# Patient Record
Sex: Male | Born: 1935
Health system: Southern US, Community
[De-identification: ages and names within clinical notes are randomized; demographics above are authoritative.]

## PROBLEM LIST (undated history)

## (undated) DIAGNOSIS — M1990 Unspecified osteoarthritis, unspecified site: Secondary | ICD-10-CM

## (undated) DIAGNOSIS — C61 Malignant neoplasm of prostate: Secondary | ICD-10-CM

## (undated) DIAGNOSIS — E785 Hyperlipidemia, unspecified: Secondary | ICD-10-CM

## (undated) DIAGNOSIS — I10 Essential (primary) hypertension: Secondary | ICD-10-CM

## (undated) DIAGNOSIS — C189 Malignant neoplasm of colon, unspecified: Secondary | ICD-10-CM

## (undated) DIAGNOSIS — N529 Male erectile dysfunction, unspecified: Secondary | ICD-10-CM

## (undated) DIAGNOSIS — K810 Acute cholecystitis: Secondary | ICD-10-CM

## (undated) DIAGNOSIS — J439 Emphysema, unspecified: Secondary | ICD-10-CM

## (undated) DIAGNOSIS — Z973 Presence of spectacles and contact lenses: Secondary | ICD-10-CM

## (undated) HISTORY — PX: APPENDECTOMY: SHX54

## (undated) HISTORY — PX: COLONOSCOPY: SHX174

## (undated) HISTORY — DX: Malignant neoplasm of colon, unspecified: C18.9

---

## 1995-06-14 HISTORY — PX: COLON SURGERY: SHX602

## 1998-05-01 ENCOUNTER — Other Ambulatory Visit: Admission: RE | Admit: 1998-05-01 | Discharge: 1998-05-01 | Payer: Self-pay | Admitting: Urology

## 1999-05-10 ENCOUNTER — Ambulatory Visit (HOSPITAL_COMMUNITY): Admission: RE | Admit: 1999-05-10 | Discharge: 1999-05-10 | Payer: Self-pay | Admitting: Gastroenterology

## 2001-11-24 ENCOUNTER — Emergency Department (HOSPITAL_COMMUNITY): Admission: EM | Admit: 2001-11-24 | Discharge: 2001-11-24 | Payer: Self-pay

## 2002-03-18 ENCOUNTER — Encounter: Payer: Self-pay | Admitting: Oncology

## 2002-03-18 ENCOUNTER — Encounter: Admission: RE | Admit: 2002-03-18 | Discharge: 2002-03-18 | Payer: Self-pay | Admitting: Oncology

## 2002-10-11 ENCOUNTER — Encounter (INDEPENDENT_AMBULATORY_CARE_PROVIDER_SITE_OTHER): Payer: Self-pay | Admitting: Specialist

## 2002-10-11 ENCOUNTER — Ambulatory Visit (HOSPITAL_COMMUNITY): Admission: RE | Admit: 2002-10-11 | Discharge: 2002-10-11 | Payer: Self-pay | Admitting: Gastroenterology

## 2008-09-10 ENCOUNTER — Ambulatory Visit (HOSPITAL_COMMUNITY): Admission: RE | Admit: 2008-09-10 | Discharge: 2008-09-10 | Payer: Self-pay | Admitting: Urology

## 2008-10-03 ENCOUNTER — Ambulatory Visit: Admission: RE | Admit: 2008-10-03 | Discharge: 2008-12-24 | Payer: Self-pay | Admitting: Radiation Oncology

## 2008-12-24 ENCOUNTER — Ambulatory Visit: Admission: RE | Admit: 2008-12-24 | Discharge: 2009-02-18 | Payer: Self-pay | Admitting: Radiation Oncology

## 2010-08-26 ENCOUNTER — Other Ambulatory Visit: Payer: Self-pay | Admitting: Gastroenterology

## 2010-10-12 HISTORY — PX: CARPAL TUNNEL RELEASE: SHX101

## 2010-10-27 ENCOUNTER — Encounter (HOSPITAL_BASED_OUTPATIENT_CLINIC_OR_DEPARTMENT_OTHER)
Admission: RE | Admit: 2010-10-27 | Discharge: 2010-10-27 | Disposition: A | Discharge status: Home / Self Care | Payer: Medicare Other | Source: Ambulatory Visit | Attending: Orthopedic Surgery | Admitting: Orthopedic Surgery

## 2010-10-27 ENCOUNTER — Ambulatory Visit
Admission: RE | Admit: 2010-10-27 | Discharge: 2010-10-27 | Disposition: A | Discharge status: Home / Self Care | Payer: Medicare Other | Source: Ambulatory Visit | Attending: Orthopedic Surgery | Admitting: Orthopedic Surgery

## 2010-10-27 ENCOUNTER — Other Ambulatory Visit: Payer: Self-pay | Admitting: Orthopedic Surgery

## 2010-10-27 DIAGNOSIS — Z01811 Encounter for preprocedural respiratory examination: Secondary | ICD-10-CM

## 2010-10-27 LAB — BASIC METABOLIC PANEL
BUN: 19 mg/dL (ref 6–23)
CO2: 27 mEq/L (ref 19–32)
Calcium: 9.8 mg/dL (ref 8.4–10.5)
Chloride: 99 mEq/L (ref 96–112)
Creatinine, Ser: 0.97 mg/dL (ref 0.4–1.5)
GFR calc Af Amer: >60 mL/min (ref >60)
GFR calc non Af Amer: >60 mL/min (ref >60)
Glucose, Bld: 138 mg/dL — ABNORMAL HIGH (ref 70–99)
Potassium: 4.5 mEq/L (ref 3.5–5.1)
Sodium: 136 mEq/L (ref 135–145)

## 2010-10-29 ENCOUNTER — Ambulatory Visit (HOSPITAL_BASED_OUTPATIENT_CLINIC_OR_DEPARTMENT_OTHER)
Admission: RE | Admit: 2010-10-29 | Discharge: 2010-10-29 | Disposition: A | Discharge status: Home / Self Care | Payer: Medicare Other | Source: Ambulatory Visit | Attending: Orthopedic Surgery | Admitting: Orthopedic Surgery

## 2010-10-29 DIAGNOSIS — Z87891 Personal history of nicotine dependence: Secondary | ICD-10-CM | POA: Insufficient documentation

## 2010-10-29 DIAGNOSIS — Z01818 Encounter for other preprocedural examination: Secondary | ICD-10-CM | POA: Insufficient documentation

## 2010-10-29 DIAGNOSIS — M129 Arthropathy, unspecified: Secondary | ICD-10-CM | POA: Insufficient documentation

## 2010-10-29 DIAGNOSIS — Z01812 Encounter for preprocedural laboratory examination: Secondary | ICD-10-CM | POA: Insufficient documentation

## 2010-10-29 DIAGNOSIS — G5600 Carpal tunnel syndrome, unspecified upper limb: Secondary | ICD-10-CM | POA: Insufficient documentation

## 2010-10-29 DIAGNOSIS — I10 Essential (primary) hypertension: Secondary | ICD-10-CM | POA: Insufficient documentation

## 2010-10-29 DIAGNOSIS — Z85038 Personal history of other malignant neoplasm of large intestine: Secondary | ICD-10-CM | POA: Insufficient documentation

## 2010-10-29 DIAGNOSIS — Z01810 Encounter for preprocedural cardiovascular examination: Secondary | ICD-10-CM | POA: Insufficient documentation

## 2010-10-29 LAB — POCT HEMOGLOBIN-HEMACUE: Hemoglobin: 18 g/dL — ABNORMAL HIGH (ref 13.0–17.0)

## 2010-10-29 NOTE — Procedures (Signed)
[place] ([place])  Patient:    Julian Brown                       MRN: [id number] Proc. Date: 05/10/99 Adm. Date:  [id number] Attending:  Rich Brave CC:         Valentino Hue. Magrinat, M.D.                           Procedure Report  PROCEDURE:  Colonoscopy.  INDICATION:  A 77 year old now three years status post right hemicolectomy for  perforated cancer of the colon presenting as sepsis while traveling [place] of state. His most recent surveillance exam, two years ago, was unremarkable.  FINDINGS:  Normal exam to the ileocolonic anastomosis.  DESCRIPTION OF PROCEDURE:  The nature, purpose, and risks of the procedure were  familiar to the patient from prior exams and he provided written consent. Sedation was fentanyl 80 mcg and Versed 8 mg IV without arrhythmias or desaturation.  The Olympus adult video colonoscope was advanced with mild difficulty due to looping in the sigmoid which was overcome by having the patient in the supine position.  The ileocolonic anastomosis was reached and the neo-terminal ileum was entered for a short distance and appeared normal.  Pullback was then performed.  The quality of the prep was very good and it was elt that all areas were adequately seen.  This was a normal examination, status post right hemicolectomy.  No polyps, recurrent cancer, colitis, vascular malformations, or diverticular disease were appreciated and retroflexion in the rectum was normal. No biopsies were obtained.  The patient tolerated the procedure well and there were no apparent complications.  IMPRESSION:  Normal colonoscopy, status post right hemicolectomy for colon cancer three years ago.  PLAN:  Follow-up colonoscopy in three years. DD:  05/10/99 TD:  05/10/99 Job: [id number]

## 2010-10-29 NOTE — Op Note (Signed)
   NAME:  Julian Brown, Julian Brown NO.:  [id number]   MEDICAL RECORD NO.:  [id number]                   PATIENT TYPE:  AMB   LOCATION:  ENDO                                 FACILITY:  [facility]   PHYSICIAN:  Bernette Redbird, M.D.                DATE OF BIRTH:  [birth year]   DATE OF PROCEDURE:  10/11/2002  DATE OF DISCHARGE:                                 OPERATIVE REPORT   PROCEDURE:  Colonoscopy with biopsies.   INDICATIONS:  A 77 year old gentleman now almost seven years out from a  right hemicolectomy for colon cancer, for routine surveillance.  His last  colonoscopy, approximately 3-1/2 years ago, was negative.   FINDINGS:  Diminutive polyps in the rectosigmoid area.   DESCRIPTION OF PROCEDURE:  The nature, purpose, and risks of the procedure  were familiar to the patient, who provided written consent.  Sedation was  fentanyl 70 mcg and Versed 7 mg IV without arrhythmias or desaturation.  Digital exam of the prostate was normal.  The Olympus adult video  colonoscope was advanced around the colon to the ileocolonic anastomosis and  the tip of the scope was nubbed into the orifice of the small bowel, after  which pullback was performed.  The quality of the prep was excellent, and it  is felt that all areas were well-seen.   At about 15 cm there were two small roughly 2-3 mm sessile polyps, removed  by cold biopsy technique.  No large polyps, recurrent cancer, colitis,  vascular malformations, or diverticular disease were appreciated.  Retroflexion in the rectum as well as reinspection in the rectum was  otherwise unremarkable.   The patient tolerated the procedure well, and there were no apparent  complications.   IMPRESSION:  1. Diminutive rectal polyps.  2. Otherwise normal surveillance exam status post right hemicolectomy for     colon cancer.    PLAN:  Await pathology on today's biopsy.  Colonoscopic follow-up in three  to five years depending on the  histologic findings.                                               Bernette Redbird, M.D.    RB/MEDQ  D:  10/11/2002  T:  10/11/2002  Job:  [id number]   cc:   Larina Earthly, M.D.  [address]  [place]  [place] [address]  Fax: [phone]   Valentino Hue. Magrinat, M.D.  [address] [facility]  [place]  [place] [address]  Fax: [phone]

## 2010-11-02 NOTE — Op Note (Signed)
  NAMETAEGEN, DELKER NO.:  [id number]  MEDICAL RECORD NO.:  [id number]          PATIENT TYPE:  LOCATION:                                 FACILITY:  PHYSICIAN:  Katy Fitch. Rise Traeger, M.D. DATE OF BIRTH:  1932-06-25  DATE OF PROCEDURE:  10/29/2010 DATE OF DISCHARGE:                              OPERATIVE REPORT   PREOPERATIVE DIAGNOSES:  Chronic entrapment neuropathy, left median nerve with possible diagnostic studies documenting moderately severe entrapment neuropathy.  POSTOPERATIVE DIAGNOSES:  Chronic entrapment neuropathy, left median nerve with possible diagnostic studies documenting moderately severe entrapment neuropathy.  OPERATION:  Release of left transverse carpal ligament.  OPERATING SURGEON:  Katy Fitch. Urho Rio, MD  ASSISTANT:  Annye Rusk, PA-C  ANESTHESIA:  General by LMA.  SUPERVISING ANESTHESIOLOGIST:  Achille Rich, MD  INDICATIONS:  Julian Brown is a 77 year old gentleman referred through courtesy of Dr. Felipa Eth for evaluation and management of hand discomfort and numbness.  Clinical examination suggests carpal tunnel syndrome of chronic nature.  Electrodiagnostic studies confirmed bilateral carpal tunnel syndrome, left worse than right.  He had no sign of stenosing tenosynovitis or other complications of tendonitis related disorders.  We advised him to proceed with release of his left transverse carpal ligament under general anesthesia.  The surgeon aftercare described in detail.  PROCEDURE:  Yoshua Geisinger was brought to the [facility] and placed in supine position on the operating table.  Under Dr. Seward Meth strict supervision, general anesthesia by LMA technique was induced.  After routine surgical time-out, the left arm was prepped with Betadine soap and solution, sterilely draped.  A pneumatic tourniquet was applied to proximal brachium.  Following exsanguination of left arm with Esmarch bandage, arterial tourniquet  was inflated to 220 mmHg.  Procedure commenced with short incision in the line of the ring finger of the palm.  Subcutaneous tissues were carefully divided revealing the palmar fascia.  This was split in line of its fibers to reveal the common sensory branch of median nerve and superficial palmar arch.  A Penfield 4 elevator was used to create a path between the median nerve proper, the ulnar bursa, and the transverse carpal ligament.  Ligament was then released with scissors along its ulnar border extending into the distal forearm.  This widely opened carpal canal.  No mass or other predicaments were noted.  The wound was then repaired with intradermal 3-0 Prolene suture.  Compressive dressing was applied with a volar plaster splint maintaining the wrist in 10 degrees of dorsiflexion.  For aftercare, Mr. Corning was provided prescription for Percocet 5 mg 1 p.o. q.4-6 h. p.r.n. pain, 20 tablets without refill.  He will return to office for followup in 7 days for dressing change, suture removal, and initiation of range of motion program.     Katy Fitch. Yoland Scherr, M.D.     RVS/MEDQ  D:  10/29/2010  T:  10/29/2010  Job:  [id number]  cc:   Larina Earthly, M.D.  Electronically Signed by Josephine Igo M.D. on 11/02/2010 09:45:21 AM

## 2011-12-08 ENCOUNTER — Other Ambulatory Visit: Payer: Self-pay | Admitting: Orthopedic Surgery

## 2011-12-09 ENCOUNTER — Encounter (HOSPITAL_BASED_OUTPATIENT_CLINIC_OR_DEPARTMENT_OTHER): Payer: Self-pay | Admitting: *Deleted

## 2011-12-09 NOTE — Progress Notes (Signed)
Here 5/12 for lct-did well To come in for ekg-bmet

## 2011-12-12 ENCOUNTER — Encounter (HOSPITAL_BASED_OUTPATIENT_CLINIC_OR_DEPARTMENT_OTHER)
Admission: RE | Admit: 2011-12-12 | Discharge: 2011-12-12 | Disposition: A | Discharge status: Home / Self Care | Payer: Medicare Other | Source: Ambulatory Visit | Attending: Orthopedic Surgery | Admitting: Orthopedic Surgery

## 2011-12-12 LAB — BASIC METABOLIC PANEL
BUN: 16 mg/dL (ref 6–23)
CO2: 25 mEq/L (ref 19–32)
Calcium: 9.5 mg/dL (ref 8.4–10.5)
Chloride: 96 mEq/L (ref 96–112)
Creatinine, Ser: 0.97 mg/dL (ref 0.50–1.35)
GFR calc Af Amer: >90 mL/min (ref >90)
GFR calc non Af Amer: 78 mL/min — ABNORMAL LOW (ref >90)
Glucose, Bld: 95 mg/dL (ref 70–99)
Potassium: 4.6 mEq/L (ref 3.5–5.1)
Sodium: 133 mEq/L — ABNORMAL LOW (ref 135–145)

## 2011-12-12 NOTE — H&P (Signed)
  Julian Brown is an 78 y.o. male.   Chief Complaint: c/o chronic and progressive right hand numbness and tingling HPI: [name] returns with persistent numbness in the right hand. When we worked him up with electrodiagnostic studies on 10-20-10 he was noted to have severe left carpal tunnel syndrome and moderate right carpal tunnel syndrome. He underwent a successful left carpal tunnel release on 10-29-10. He has had complete relief of his symptoms on the left.   Past Medical History  Diagnosis Date  . Hypertension   . Hyperlipemia   . Cancer     prostate cancer-tx radiation  . Arthritis   . ED (erectile dysfunction)   . Wears glasses     Past Surgical History  Procedure Date  . Carpal tunnel release 5/12    lt  . Appendectomy   . Colonoscopy   . Colon surgery 1997    hemicolectomy-rt-ca    No family history on file. Social History:  reports that he quit smoking about 25 years ago. He does not have any smokeless tobacco history on file. He reports that he drinks alcohol. He reports that he does not use illicit drugs.  Allergies:  Allergies  Allergen Reactions  . Penicillins Rash    No prescriptions prior to admission    Results for orders placed during the hospital encounter of 12/13/11 (from the past 48 hour(s))  BASIC METABOLIC PANEL     Status: Abnormal   Collection Time   12/12/11 12:00 PM      Component Value Range Comment   Sodium 133 (*) 135 - 145 mEq/L    Potassium 4.6  3.5 - 5.1 mEq/L    Chloride 96  96 - 112 mEq/L    CO2 25  19 - 32 mEq/L    Glucose, Bld 95  70 - 99 mg/dL    BUN 16  6 - 23 mg/dL    Creatinine, Ser 1.61  0.50 - 1.35 mg/dL    Calcium 9.5  8.4 - 09.6 mg/dL    GFR calc non Af Amer 78 (*) >90 mL/min    GFR calc Af Amer >90  >90 mL/min     No results found.   Pertinent items are noted in HPI.  There were no vitals taken for this visit.  General appearance: alert Head: Normocephalic, without obvious abnormality Neck: supple,  symmetrical, trachea midline Resp: clear to auscultation bilaterally Cardio: regular rate and rhythm GI: normal findings: bowel sounds normal Extremities:Clinical examination reveals altered motion of the wrist. He has swelling over the radiocarpal articulation. He has intact pulses and capillary refill. He has positive provocative signs of carpal tunnel syndrome on the right.  X-rays of his wrist document radioscaphoid arthrosis and significant thumb CMC arthrosis Eaton stage III. 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  Plan: We will schedule him for release of his right transverse carpal ligament and injection of his radiocarpal articulation on the right under anesthesia. The surgery, after care, risks and benefits were described in detail.   DASNOIT,Uzoma Vivona J 12/12/2011, 8:20 PM    H&P documentation: 12/13/2011  -History and Physical Reviewed  -Patient has been re-examined  -No change in the plan of care  Wyn Forster, MD

## 2011-12-13 ENCOUNTER — Ambulatory Visit (HOSPITAL_BASED_OUTPATIENT_CLINIC_OR_DEPARTMENT_OTHER)
Admission: RE | Admit: 2011-12-13 | Discharge: 2011-12-13 | Disposition: A | Discharge status: Home / Self Care | Payer: Medicare Other | Source: Ambulatory Visit | Attending: Orthopedic Surgery | Admitting: Orthopedic Surgery

## 2011-12-13 ENCOUNTER — Ambulatory Visit (HOSPITAL_BASED_OUTPATIENT_CLINIC_OR_DEPARTMENT_OTHER): Payer: Medicare Other | Admitting: *Deleted

## 2011-12-13 ENCOUNTER — Encounter (HOSPITAL_BASED_OUTPATIENT_CLINIC_OR_DEPARTMENT_OTHER)
Admission: RE | Disposition: A | Discharge status: Home / Self Care | Payer: Self-pay | Source: Ambulatory Visit | Attending: Orthopedic Surgery

## 2011-12-13 ENCOUNTER — Encounter (HOSPITAL_BASED_OUTPATIENT_CLINIC_OR_DEPARTMENT_OTHER): Payer: Self-pay | Admitting: *Deleted

## 2011-12-13 DIAGNOSIS — G5600 Carpal tunnel syndrome, unspecified upper limb: Secondary | ICD-10-CM | POA: Insufficient documentation

## 2011-12-13 DIAGNOSIS — M19039 Primary osteoarthritis, unspecified wrist: Secondary | ICD-10-CM | POA: Insufficient documentation

## 2011-12-13 DIAGNOSIS — Z8546 Personal history of malignant neoplasm of prostate: Secondary | ICD-10-CM | POA: Insufficient documentation

## 2011-12-13 DIAGNOSIS — E785 Hyperlipidemia, unspecified: Secondary | ICD-10-CM | POA: Insufficient documentation

## 2011-12-13 DIAGNOSIS — N529 Male erectile dysfunction, unspecified: Secondary | ICD-10-CM | POA: Insufficient documentation

## 2011-12-13 DIAGNOSIS — Z01810 Encounter for preprocedural cardiovascular examination: Secondary | ICD-10-CM | POA: Insufficient documentation

## 2011-12-13 DIAGNOSIS — I10 Essential (primary) hypertension: Secondary | ICD-10-CM | POA: Insufficient documentation

## 2011-12-13 HISTORY — DX: Male erectile dysfunction, unspecified: N52.9

## 2011-12-13 HISTORY — DX: Presence of spectacles and contact lenses: Z97.3

## 2011-12-13 HISTORY — DX: Essential (primary) hypertension: I10

## 2011-12-13 HISTORY — DX: Unspecified osteoarthritis, unspecified site: M19.90

## 2011-12-13 HISTORY — DX: Hyperlipidemia, unspecified: E78.5

## 2011-12-13 HISTORY — PX: CARPAL TUNNEL RELEASE: SHX101

## 2011-12-13 LAB — POCT HEMOGLOBIN-HEMACUE: Hemoglobin: 11.4 g/dL — ABNORMAL LOW (ref 13.0–17.0)

## 2011-12-13 SURGERY — CARPAL TUNNEL RELEASE
Anesthesia: General | Laterality: Right

## 2011-12-13 MED ORDER — DEXAMETHASONE SODIUM PHOSPHATE 10 MG/ML IJ SOLN
INTRAMUSCULAR | Status: DC | PRN
  Administered 2011-12-13: 10 mg via INTRAVENOUS

## 2011-12-13 MED ORDER — LIDOCAINE HCL 2 % IJ SOLN
INTRAMUSCULAR | Status: DC | PRN
  Administered 2011-12-13: 2 mL

## 2011-12-13 MED ORDER — FENTANYL CITRATE 0.05 MG/ML IJ SOLN
INTRAMUSCULAR | Status: DC | PRN
  Administered 2011-12-13: 50 ug via INTRAVENOUS

## 2011-12-13 MED ORDER — LACTATED RINGERS IV SOLN
INTRAVENOUS | Status: DC
  Administered 2011-12-13: 07:00:00 via INTRAVENOUS

## 2011-12-13 MED ORDER — METHYLPREDNISOLONE ACETATE 80 MG/ML IJ SUSP
INTRAMUSCULAR | Status: DC | PRN
  Administered 2011-12-13: 80 mg via INTRA_ARTICULAR

## 2011-12-13 MED ORDER — ONDANSETRON HCL 4 MG/2ML IJ SOLN: 4.0000 mg | Freq: Once | INTRAMUSCULAR | Status: DC | PRN

## 2011-12-13 MED ORDER — HYDROMORPHONE HCL PF 1 MG/ML IJ SOLN: 0.2500 mg | INTRAMUSCULAR | Status: DC | PRN

## 2011-12-13 MED ORDER — OXYCODONE-ACETAMINOPHEN 5-325 MG PO TABS: ORAL_TABLET | ORAL | Status: DC

## 2011-12-13 MED ORDER — CHLORHEXIDINE GLUCONATE 4 % EX LIQD: 60.0000 mL | Freq: Once | CUTANEOUS | Status: DC

## 2011-12-13 MED ORDER — ONDANSETRON HCL 4 MG/2ML IJ SOLN
INTRAMUSCULAR | Status: DC | PRN
  Administered 2011-12-13: 4 mg via INTRAVENOUS

## 2011-12-13 MED ORDER — PROPOFOL 10 MG/ML IV EMUL
INTRAVENOUS | Status: DC | PRN
  Administered 2011-12-13: 200 mg via INTRAVENOUS

## 2011-12-13 MED ORDER — LIDOCAINE HCL (CARDIAC) 20 MG/ML IV SOLN
INTRAVENOUS | Status: DC | PRN
  Administered 2011-12-13: 80 mg via INTRAVENOUS

## 2011-12-13 SURGICAL SUPPLY — 36 items
BANDAGE ADHESIVE 1X3 (GAUZE/BANDAGES/DRESSINGS) IMPLANT
BANDAGE ELASTIC 3 VELCRO ST LF (GAUZE/BANDAGES/DRESSINGS) ×2 IMPLANT
BLADE SURG 15 STRL LF DISP TIS (BLADE) ×1 IMPLANT
BLADE SURG 15 STRL SS (BLADE) ×1
BNDG ESMARK 4X9 LF (GAUZE/BANDAGES/DRESSINGS) ×2 IMPLANT
BRUSH SCRUB EZ PLAIN DRY (MISCELLANEOUS) ×2 IMPLANT
CLOTH BEACON ORANGE TIMEOUT ST (SAFETY) ×2 IMPLANT
CORDS BIPOLAR (ELECTRODE) IMPLANT
COVER MAYO STAND STRL (DRAPES) ×2 IMPLANT
COVER TABLE BACK 60X90 (DRAPES) ×2 IMPLANT
CUFF TOURNIQUET SINGLE 18IN (TOURNIQUET CUFF) IMPLANT
DECANTER SPIKE VIAL GLASS SM (MISCELLANEOUS) IMPLANT
DRAPE EXTREMITY T 121X128X90 (DRAPE) ×2 IMPLANT
DRAPE SURG 17X23 STRL (DRAPES) ×2 IMPLANT
GLOVE BIO SURGEON STRL SZ7 (GLOVE) ×2 IMPLANT
GLOVE BIOGEL M STRL SZ7.5 (GLOVE) ×2 IMPLANT
GLOVE ORTHO TXT STRL SZ7.5 (GLOVE) ×2 IMPLANT
GOWN PREVENTION PLUS XLARGE (GOWN DISPOSABLE) ×2 IMPLANT
GOWN PREVENTION PLUS XXLARGE (GOWN DISPOSABLE) ×4 IMPLANT
NEEDLE 27GAX1X1/2 (NEEDLE) ×2 IMPLANT
PACK BASIN DAY SURGERY FS (CUSTOM PROCEDURE TRAY) ×2 IMPLANT
PAD CAST 3X4 CTTN HI CHSV (CAST SUPPLIES) ×1 IMPLANT
PADDING CAST ABS 4INX4YD NS (CAST SUPPLIES) ×1
PADDING CAST ABS COTTON 4X4 ST (CAST SUPPLIES) ×1 IMPLANT
PADDING CAST COTTON 3X4 STRL (CAST SUPPLIES) ×1
SPLINT PLASTER CAST XFAST 3X15 (CAST SUPPLIES) ×5 IMPLANT
SPLINT PLASTER XTRA FASTSET 3X (CAST SUPPLIES) ×5
SPONGE GAUZE 4X4 12PLY (GAUZE/BANDAGES/DRESSINGS) ×2 IMPLANT
STOCKINETTE 4X48 STRL (DRAPES) ×2 IMPLANT
STRIP CLOSURE SKIN 1/2X4 (GAUZE/BANDAGES/DRESSINGS) ×2 IMPLANT
SUT PROLENE 3 0 PS 2 (SUTURE) ×2 IMPLANT
SYR 3ML 23GX1 SAFETY (SYRINGE) IMPLANT
SYR CONTROL 10ML LL (SYRINGE) ×2 IMPLANT
TRAY DSU PREP LF (CUSTOM PROCEDURE TRAY) ×2 IMPLANT
UNDERPAD 30X30 INCONTINENT (UNDERPADS AND DIAPERS) ×2 IMPLANT
WATER STERILE IRR 1000ML POUR (IV SOLUTION) ×2 IMPLANT

## 2011-12-13 NOTE — Anesthesia Procedure Notes (Signed)
Procedure Name: LMA Insertion Date/Time: 12/13/2011 7:56 AM Performed by: Meyer Russel Pre-anesthesia Checklist: Patient identified, Emergency Drugs available, Suction available and Patient being monitored Patient Re-evaluated:Patient Re-evaluated prior to inductionOxygen Delivery Method: Circle System Utilized Preoxygenation: Pre-oxygenation with 100% oxygen Intubation Type: IV induction Ventilation: Mask ventilation without difficulty LMA: LMA inserted LMA Size: 5.0 Number of attempts: 1 Airway Equipment and Method: bite block Placement Confirmation: positive ETCO2 and breath sounds checked- equal and bilateral Tube secured with: Tape Dental Injury: Teeth and Oropharynx as per pre-operative assessment

## 2011-12-13 NOTE — Anesthesia Postprocedure Evaluation (Signed)
  Anesthesia Post-op Note  Patient: Julian Brown  Procedure(s) Performed: Procedure(s) (LRB): CARPAL TUNNEL RELEASE (Right)  Patient Location: PACU  Anesthesia Type: General  Level of Consciousness: awake, alert  and oriented  Airway and Oxygen Therapy: Patient Spontanous Breathing  Post-op Pain: none  Post-op Assessment: Post-op Vital signs reviewed  Post-op Vital Signs: Reviewed  Complications: No apparent anesthesia complications

## 2011-12-13 NOTE — Anesthesia Preprocedure Evaluation (Addendum)
Anesthesia Evaluation  Patient identified by MRN, date of birth, ID band Patient awake    Reviewed: Allergy & Precautions, H&P , NPO status , Patient's Chart, lab work & pertinent test results, reviewed documented beta blocker date and time   Airway Mallampati: I TM Distance: >3 FB Neck ROM: Full    Dental  (+) Teeth Intact and Dental Advisory Given   Pulmonary  breath sounds clear to auscultation        Cardiovascular hypertension, Pt. on medications and Pt. on home beta blockers Rhythm:Regular Rate:Normal     Neuro/Psych    GI/Hepatic   Endo/Other    Renal/GU      Musculoskeletal   Abdominal   Peds  Hematology   Anesthesia Other Findings   Reproductive/Obstetrics                           Anesthesia Physical Anesthesia Plan  ASA: II  Anesthesia Plan: General   Post-op Pain Management:    Induction: Intravenous  Airway Management Planned: LMA  Additional Equipment:   Intra-op Plan:   Post-operative Plan: Extubation in OR  Informed Consent: I have reviewed the patients History and Physical, chart, labs and discussed the procedure including the risks, benefits and alternatives for the proposed anesthesia with the patient or authorized representative who has indicated his/her understanding and acceptance.   Dental advisory given  Plan Discussed with: CRNA, Anesthesiologist and Surgeon  Anesthesia Plan Comments:         Anesthesia Quick Evaluation  

## 2011-12-13 NOTE — Brief Op Note (Signed)
12/13/2011  8:18 AM  PATIENT:  Julian Brown  78 y.o. male  PRE-OPERATIVE DIAGNOSIS:  right cts, right wrist arthritis  POST-OPERATIVE DIAGNOSIS:  Right cts, right wrist arthritis  PROCEDURE:  INJECTION OF RIGHT RADIOCARPAL JOINT WITH 80 MG OF DEPOMEDROL AND 2% LIDOCAINE CARPAL TUNNEL RELEASE (Right)  SURGEON:  Wyn Forster., MD   PHYSICIAN ASSISTANT:   ASSISTANTS: Mallory Shirk.A-C   ANESTHESIA:   general  EBL:     BLOOD ADMINISTERED:none  DRAINS: none   LOCAL MEDICATIONS USED:  XYLOCAINE   SPECIMEN:  No Specimen  DISPOSITION OF SPECIMEN:  N/A  COUNTS:  YES  TOURNIQUET:   Total Tourniquet Time Documented: Upper Arm (Right) - 9 minutes  DICTATION: .Other Dictation: Dictation Number [id number]  PLAN OF CARE: Discharge to home after PACU  PATIENT DISPOSITION:  PACU - hemodynamically stable.

## 2011-12-13 NOTE — Discharge Instructions (Signed)

## 2011-12-13 NOTE — Transfer of Care (Signed)
Immediate Anesthesia Transfer of Care Note  Patient: Julian Brown  Procedure(s) Performed: Procedure(s) (LRB): CARPAL TUNNEL RELEASE (Right)  Patient Location: PACU  Anesthesia Type: General  Level of Consciousness: awake and oriented  Airway & Oxygen Therapy: Patient Spontanous Breathing and Patient connected to face mask oxygen  Post-op Assessment: Report given to PACU RN, Post -op Vital signs reviewed and stable and Patient moving all extremities  Post vital signs: Reviewed and stable  Complications: No apparent anesthesia complications

## 2011-12-13 NOTE — Op Note (Signed)
[id number] 

## 2011-12-14 NOTE — Progress Notes (Signed)
Pt with increased pain at home on DOS. Dr. Teressa Senter called and pt instructed to take Ibuprofen in addition to narcotic prescription. Pt states pain greatly improved this am.

## 2011-12-14 NOTE — Op Note (Signed)
NAME:  Julian Brown, Julian Brown         ACCOUNT NO.:  [id number]  MEDICAL RECORD NO.:  [id number]  LOCATION:                                 FACILITY:  PHYSICIAN:  Katy Fitch. Geoff Dacanay, M.D. DATE OF BIRTH:  11-Jan-1933  DATE OF PROCEDURE:  12/13/2011 DATE OF DISCHARGE:                              OPERATIVE REPORT   PREOPERATIVE DIAGNOSES: 1. Chronic right median entrapment neuropathy at carpal tunnel. 2. Severe radiocarpal arthrosis, right wrist.  POSTOPERATIVE DIAGNOSES: 1. Chronic right median entrapment neuropathy at carpal tunnel. 2. Severe radiocarpal arthrosis, right wrist.  OPERATION: 1. Release of right transverse carpal ligament. 2. Injection of right radiocarpal joint with 80 mg of Depo-Medrol and     2 mL of 2% lidocaine without epinephrine.  OPERATING SURGEON:  Katy Fitch. Tiquan Bouch, MD.  ASSISTANT:  Marveen Reeks. Dasnoit, PA-C.  ANESTHESIA:  General by LMA.  SUPERVISING ANESTHESIOLOGIST:  Sheldon Silvan, M.D.  INDICATIONS:  Chandler Swiderski is a 78 year old gentleman referred through the courtesy of Dr. of [facility] for evaluation and management of hand numbness and wrist pain.  Previous clinical examination revealed evidence of bilateral carpal tunnel syndrome, confirmed by bilateral electrodiagnostic studies, revealing significant median neuropathy.  Mr. Vassell is status post release of his left transverse carpal ligament with satisfactory long-term result.  He now returns for similar surgery on the right.  At the time of his preoperative consult, he requested that we also inject his wrist, which is known to have significant radiocarpal arthrosis.  Preoperatively, he was reminded potential risks, benefits of surgery. Questions invited and answered in detail.  PROCEDURE:  Delvon Chipps was brought to room 1 of the [facility] and placed supine position on the operating table.  Following a detailed anesthesia informed consent by Dr. Ivin Booty, general anesthesia by LMA  technique was recommended and accepted.  In room 1 under Dr. Ivin Booty' direct supervision, general anesthesia was induced, followed by routine Betadine scrub and paint on the right upper extremity.  Following routine surgical time-out, the procedure commenced with exsanguination of the right arm with Esmarch bandage, inflation of the arterial tourniquet on proximal brachium to 220 mmHg.  Procedure commenced with a short incision in the line of the ring finger of the palm.  Subcutaneous tissues were carefully divided revealing the palmar fascia.  This was split longitudinally to reveal the common sensory branch of the median nerve.  These were followed back to the transverse carpal ligament, which was gently isolated from the median nerve proper.  A Penfield 4 elevator was used to clear pathway into the distal forearm.  The transverse carpal ligament was then released with scissors extending into the distal forearm.  Bleeding points along the margin of the released ligament were electrocauterized with bipolar current, followed by repair of the skin with intradermal 3-0 Prolene suture.  Thereafter, the dorsoradial aspect of the wrist joint was palpated and a 27-gauge needle was placed at the junction of the proximal pole of the scaphoid and the distal radial facet for the scaphoid.  With the wrist in ulnar deviation, a mixture of 1 mL of Depo-Medrol 80 mg and 2 mL of 2% lidocaine without epinephrine was introduced into the radiocarpal space without complication.  The wound was then dressed with Steri-Strips, sterile gauze, sterile Webril, and a volar plaster splint.  No apparent complications.     Katy Fitch Hanae Waiters, M.D.     RVS/MEDQ  D:  12/13/2011  T:  12/13/2011  Job:  [id number]  cc:   Larina Earthly, M.D.

## 2011-12-16 ENCOUNTER — Encounter (HOSPITAL_BASED_OUTPATIENT_CLINIC_OR_DEPARTMENT_OTHER): Payer: Self-pay | Admitting: Orthopedic Surgery

## 2012-11-26 ENCOUNTER — Encounter (HOSPITAL_COMMUNITY): Payer: Self-pay | Admitting: *Deleted

## 2012-11-26 ENCOUNTER — Emergency Department (HOSPITAL_COMMUNITY)
Admission: EM | Admit: 2012-11-26 | Discharge: 2012-11-27 | Disposition: A | Discharge status: Home / Self Care | Payer: Medicare Other | Attending: Emergency Medicine | Admitting: Emergency Medicine

## 2012-11-26 DIAGNOSIS — Z862 Personal history of diseases of the blood and blood-forming organs and certain disorders involving the immune mechanism: Secondary | ICD-10-CM | POA: Insufficient documentation

## 2012-11-26 DIAGNOSIS — M129 Arthropathy, unspecified: Secondary | ICD-10-CM | POA: Insufficient documentation

## 2012-11-26 DIAGNOSIS — Z9119 Patient's noncompliance with other medical treatment and regimen: Secondary | ICD-10-CM | POA: Insufficient documentation

## 2012-11-26 DIAGNOSIS — Z91199 Patient's noncompliance with other medical treatment and regimen due to unspecified reason: Secondary | ICD-10-CM | POA: Insufficient documentation

## 2012-11-26 DIAGNOSIS — Z8639 Personal history of other endocrine, nutritional and metabolic disease: Secondary | ICD-10-CM | POA: Insufficient documentation

## 2012-11-26 DIAGNOSIS — Z923 Personal history of irradiation: Secondary | ICD-10-CM | POA: Insufficient documentation

## 2012-11-26 DIAGNOSIS — R809 Proteinuria, unspecified: Secondary | ICD-10-CM | POA: Insufficient documentation

## 2012-11-26 DIAGNOSIS — R519 Headache, unspecified: Secondary | ICD-10-CM

## 2012-11-26 DIAGNOSIS — R51 Headache: Secondary | ICD-10-CM | POA: Insufficient documentation

## 2012-11-26 DIAGNOSIS — Z79899 Other long term (current) drug therapy: Secondary | ICD-10-CM | POA: Insufficient documentation

## 2012-11-26 DIAGNOSIS — I10 Essential (primary) hypertension: Secondary | ICD-10-CM | POA: Insufficient documentation

## 2012-11-26 DIAGNOSIS — Z789 Other specified health status: Secondary | ICD-10-CM | POA: Insufficient documentation

## 2012-11-26 DIAGNOSIS — N529 Male erectile dysfunction, unspecified: Secondary | ICD-10-CM | POA: Insufficient documentation

## 2012-11-26 DIAGNOSIS — Z7982 Long term (current) use of aspirin: Secondary | ICD-10-CM | POA: Insufficient documentation

## 2012-11-26 DIAGNOSIS — E871 Hypo-osmolality and hyponatremia: Secondary | ICD-10-CM | POA: Insufficient documentation

## 2012-11-26 DIAGNOSIS — Z87891 Personal history of nicotine dependence: Secondary | ICD-10-CM | POA: Insufficient documentation

## 2012-11-26 DIAGNOSIS — Z880 Allergy status to penicillin: Secondary | ICD-10-CM | POA: Insufficient documentation

## 2012-11-26 DIAGNOSIS — Z8546 Personal history of malignant neoplasm of prostate: Secondary | ICD-10-CM | POA: Insufficient documentation

## 2012-11-26 NOTE — ED Notes (Signed)
Pt states that he had a headache today and it read high at home; pt states that it may have been elevated last pm; pt had rt knee surgery on Thurs.

## 2012-11-26 NOTE — ED Provider Notes (Signed)
History     CSN: [id number]  Arrival date & time 11/26/12  2321   First MD Initiated Contact with Patient 11/26/12 2344      Chief Complaint  Patient presents with  . Hypertension    (Consider location/radiation/quality/duration/timing/severity/associated sxs/prior treatment) Patient is a 79 y.o. male presenting with hypertension. The history is provided by the patient.  Hypertension  He had arthroscopic surgery on his left knee 4 days ago and stopped taking his blood pressure medications. He resumed taking his blood pressure medication this morning for the first time. Yesterday, at home, he tried to check his blood pressure and wouldn't register on his machine. Today, his blood pressure would also did not register on machine so went to the fire department where read very high-he thinks it was 200/140. He was seen by the PA at his physician's office and blood pressure was normal at that time but went up during the night to where it once again was not being read by his home monitor. He has developed a mild occipital headache this evening. Denies any visual change, tinnitus, nosebleeds, chest pain, dyspnea. He normally he only checks his blood pressure couple times a month and it is generally well-controlled.  Past Medical History  Diagnosis Date  . Hypertension   . Hyperlipemia   . Cancer     prostate cancer-tx radiation  . Arthritis   . ED (erectile dysfunction)   . Wears glasses     Past Surgical History  Procedure Laterality Date  . Carpal tunnel release  5/12    lt  . Appendectomy    . Colonoscopy    . Colon surgery  1997    hemicolectomy-rt-ca  . Carpal tunnel release  12/13/2011    Procedure: CARPAL TUNNEL RELEASE;  Surgeon: Wyn Forster., MD;  Location: [facility];  Service: Orthopedics;  Laterality: Right;  and inject right wrist    No family history on file.  History  Substance Use Topics  . Smoking status: Former Smoker    Quit date:  12/09/1986  . Smokeless tobacco: Not on file  . Alcohol Use: Yes     Comment: occ      Review of Systems  All other systems reviewed and are negative.    Allergies  Penicillins  Home Medications   Current Outpatient Rx  Name  Route  Sig  Dispense  Refill  . amLODipine-olmesartan (AZOR) 10-40 MG per tablet   Oral   Take 1 tablet by mouth daily.         Marland Kitchen aspirin 325 MG tablet   Oral   Take 325 mg by mouth daily.         . carvedilol (COREG) 6.25 MG tablet   Oral   Take 6.25 mg by mouth 2 (two) times daily with a meal.         . cholecalciferol (VITAMIN D) 1000 UNITS tablet   Oral   Take 1,000 Units by mouth daily.         Marland Kitchen ezetimibe (ZETIA) 10 MG tablet   Oral   Take 10 mg by mouth daily.         . fish oil-omega-3 fatty acids 1000 MG capsule   Oral   Take 1 g by mouth daily.          . hydrochlorothiazide (HYDRODIURIL) 25 MG tablet   Oral   Take 25 mg by mouth daily.         Marland Kitchen  HYDROcodone-acetaminophen (NORCO/VICODIN) 5-325 MG per tablet   Oral   Take 1 tablet by mouth every 6 (six) hours as needed for pain.         . meloxicam (MOBIC) 7.5 MG tablet   Oral   Take 7.5 mg by mouth daily.         . sildenafil (VIAGRA) 100 MG tablet   Oral   Take 100 mg by mouth daily as needed for erectile dysfunction.          Marland Kitchen tiZANidine (ZANAFLEX) 4 MG tablet   Oral   Take 4 mg by mouth every 6 (six) hours as needed.           BP 222/84  Pulse 71  Temp(Src) 98.2 F (36.8 C) (Oral)  Resp 20  Ht 5\' 11"  (1.803 m)  Wt 228 lb (103.42 kg)  BMI 31.81 kg/m2  SpO2 93%  Physical Exam  Nursing note and vitals reviewed.  79 year old male, resting comfortably and in no acute distress. Vital signs are significant for hypertension with blood pressure 222/84. Oxygen saturation is 93%, which is normal. Head is normocephalic and atraumatic. PERRLA, EOMI. Oropharynx is clear. Fundi show no hemorrhage, exudate, or papilledema. Neck is nontender and  supple without adenopathy or JVD. Back is nontender and there is no CVA tenderness. Lungs are clear without rales, wheezes, or rhonchi. Chest is nontender. Heart has regular rate and rhythm without murmur. Abdomen is soft, flat, nontender without masses or hepatosplenomegaly and peristalsis is normoactive. Extremities have no cyanosis or edema, full range of motion is present. Mild swelling is present in the left knee with bandages in place consistent with postsurgical changes. Skin is warm and dry without rash. Neurologic: Mental status is normal, cranial nerves are intact, there are no motor or sensory deficits.  ED Course  Procedures (including critical care time)  Results for orders placed during the hospital encounter of 11/26/12  CBC WITH DIFFERENTIAL      Result Value Range   WBC 7.3  4.0 - 10.5 K/uL   RBC 4.65  4.22 - 5.81 MIL/uL   Hemoglobin 16.4  13.0 - 17.0 g/dL   HCT 16.1  09.6 - 04.5 %   MCV 98.3  78.0 - 100.0 fL   MCH 35.3 (*) 26.0 - 34.0 pg   MCHC 35.9  30.0 - 36.0 g/dL   RDW 40.9  81.1 - 91.4 %   Platelets 239  150 - 400 K/uL   Neutrophils Relative % 63  43 - 77 %   Neutro Abs 4.6  1.7 - 7.7 K/uL   Lymphocytes Relative 20  12 - 46 %   Lymphs Abs 1.5  0.7 - 4.0 K/uL   Monocytes Relative 15 (*) 3 - 12 %   Monocytes Absolute 1.1 (*) 0.1 - 1.0 K/uL   Eosinophils Relative 1  0 - 5 %   Eosinophils Absolute 0.1  0.0 - 0.7 K/uL   Basophils Relative 0  0 - 1 %   Basophils Absolute 0.0  0.0 - 0.1 K/uL  BASIC METABOLIC PANEL      Result Value Range   Sodium 129 (*) 135 - 145 mEq/L   Potassium 4.3  3.5 - 5.1 mEq/L   Chloride 94 (*) 96 - 112 mEq/L   CO2 27  19 - 32 mEq/L   Glucose, Bld 95  70 - 99 mg/dL   BUN 15  6 - 23 mg/dL   Creatinine, Ser 7.82  0.50 -  1.35 mg/dL   Calcium 9.5  8.4 - 16.1 mg/dL   GFR calc non Af Amer 80 (*) >90 mL/min   GFR calc Af Amer >90  >90 mL/min  URINALYSIS, ROUTINE W REFLEX MICROSCOPIC      Result Value Range   Color, Urine YELLOW   YELLOW   APPearance CLEAR  CLEAR   Specific Gravity, Urine 1.013  1.005 - 1.030   pH 7.5  5.0 - 8.0   Glucose, UA NEGATIVE  NEGATIVE mg/dL   Hgb urine dipstick TRACE (*) NEGATIVE   Bilirubin Urine NEGATIVE  NEGATIVE   Ketones, ur NEGATIVE  NEGATIVE mg/dL   Protein, ur 096 (*) NEGATIVE mg/dL   Urobilinogen, UA 1.0  0.0 - 1.0 mg/dL   Nitrite NEGATIVE  NEGATIVE   Leukocytes, UA NEGATIVE  NEGATIVE  URINE MICROSCOPIC-ADD ON      Result Value Range   RBC / HPF 0-2  <3 RBC/hpf     1. Hypertension   2. Headache   3. Proteinuria   4. Hyponatremia       MDM  Hypertension most likely secondary to medication noncompliance. I am concerned about the presence of headache although the headache could be unrelated. Screening labs will be obtained to look for end organ effects of hypertension and he will be given a dose of clonidine to acutely bring his blood pressure down.  Blood pressures come down to 186/78, and headache is improved. Urinalysis does show protein 100 mg/dl which is most likely related to chronic hypertension. Hyponatremia is present not significantly changed from baseline. He will likely need to be on his medications for 3-5 days before he starts getting adequate control blood pressure. He is discharged with a prescription for clonidine which he is to take 3 times a day as needed for blood pressure that his not adequately controlled.  Dione Booze, MD 11/27/12 [id number]

## 2012-11-27 LAB — CBC WITH DIFFERENTIAL/PLATELET
Basophils Absolute: 0 10*3/uL (ref 0.0–0.1)
Basophils Relative: 0 % (ref 0–1)
Eosinophils Absolute: 0.1 10*3/uL (ref 0.0–0.7)
Eosinophils Relative: 1 % (ref 0–5)
HCT: 45.7 % (ref 39.0–52.0)
Hemoglobin: 16.4 g/dL (ref 13.0–17.0)
Lymphocytes Relative: 20 % (ref 12–46)
Lymphs Abs: 1.5 10*3/uL (ref 0.7–4.0)
MCH: 35.3 pg — ABNORMAL HIGH (ref 26.0–34.0)
MCHC: 35.9 g/dL (ref 30.0–36.0)
MCV: 98.3 fL (ref 78.0–100.0)
Monocytes Absolute: 1.1 10*3/uL — ABNORMAL HIGH (ref 0.1–1.0)
Monocytes Relative: 15 % — ABNORMAL HIGH (ref 3–12)
Neutro Abs: 4.6 10*3/uL (ref 1.7–7.7)
Neutrophils Relative %: 63 % (ref 43–77)
Platelets: 239 10*3/uL (ref 150–400)
RBC: 4.65 MIL/uL (ref 4.22–5.81)
RDW: 13.1 % (ref 11.5–15.5)
WBC: 7.3 10*3/uL (ref 4.0–10.5)

## 2012-11-27 LAB — URINALYSIS, ROUTINE W REFLEX MICROSCOPIC
Bilirubin Urine: NEGATIVE
Glucose, UA: NEGATIVE mg/dL
Ketones, ur: NEGATIVE mg/dL
Leukocytes, UA: NEGATIVE
Nitrite: NEGATIVE
Protein, ur: 100 mg/dL — AB
Specific Gravity, Urine: 1.013 (ref 1.005–1.030)
Urobilinogen, UA: 1 mg/dL (ref 0.0–1.0)
pH: 7.5 (ref 5.0–8.0)

## 2012-11-27 LAB — BASIC METABOLIC PANEL
BUN: 15 mg/dL (ref 6–23)
CO2: 27 mEq/L (ref 19–32)
Calcium: 9.5 mg/dL (ref 8.4–10.5)
Chloride: 94 mEq/L — ABNORMAL LOW (ref 96–112)
Creatinine, Ser: 0.91 mg/dL (ref 0.50–1.35)
GFR calc Af Amer: >90 mL/min (ref >90)
GFR calc non Af Amer: 80 mL/min — ABNORMAL LOW (ref >90)
Glucose, Bld: 95 mg/dL (ref 70–99)
Potassium: 4.3 mEq/L (ref 3.5–5.1)
Sodium: 129 mEq/L — ABNORMAL LOW (ref 135–145)

## 2012-11-27 LAB — URINE MICROSCOPIC-ADD ON

## 2012-11-27 MED ORDER — CLONIDINE HCL 0.1 MG PO TABS
0.1000 mg | ORAL_TABLET | Freq: Once | ORAL | Status: AC
  Administered 2012-11-27: 0.1 mg via ORAL
  Filled 2012-11-27: qty 1

## 2012-11-27 MED ORDER — CLONIDINE HCL 0.1 MG PO TABS: 0.1000 mg | ORAL_TABLET | Freq: Three times a day (TID) | ORAL | Status: DC | PRN

## 2016-05-27 ENCOUNTER — Other Ambulatory Visit (INDEPENDENT_AMBULATORY_CARE_PROVIDER_SITE_OTHER): Payer: Self-pay

## 2016-05-27 DIAGNOSIS — M25561 Pain in right knee: Secondary | ICD-10-CM

## 2016-05-30 ENCOUNTER — Encounter (INDEPENDENT_AMBULATORY_CARE_PROVIDER_SITE_OTHER): Payer: Self-pay

## 2016-05-30 ENCOUNTER — Ambulatory Visit (HOSPITAL_BASED_OUTPATIENT_CLINIC_OR_DEPARTMENT_OTHER)
Admission: RE | Admit: 2016-05-30 | Discharge: 2016-05-30 | Disposition: A | Discharge status: Home / Self Care | Payer: Medicare Other | Source: Ambulatory Visit | Attending: Orthopaedic Surgery | Admitting: Orthopaedic Surgery

## 2016-05-30 ENCOUNTER — Ambulatory Visit (INDEPENDENT_AMBULATORY_CARE_PROVIDER_SITE_OTHER): Payer: Medicare Other | Admitting: Orthopaedic Surgery

## 2016-05-30 DIAGNOSIS — G8929 Other chronic pain: Secondary | ICD-10-CM

## 2016-05-30 DIAGNOSIS — M1711 Unilateral primary osteoarthritis, right knee: Secondary | ICD-10-CM

## 2016-05-30 DIAGNOSIS — M25561 Pain in right knee: Secondary | ICD-10-CM | POA: Insufficient documentation

## 2016-05-30 MED ORDER — LIDOCAINE HCL 1 % IJ SOLN
3.0000 mL | INTRAMUSCULAR | Status: AC | PRN
  Administered 2016-05-30: 3 mL

## 2016-05-30 MED ORDER — METHYLPREDNISOLONE ACETATE 40 MG/ML IJ SUSP
40.0000 mg | INTRAMUSCULAR | Status: AC | PRN
  Administered 2016-05-30: 40 mg via INTRA_ARTICULAR

## 2016-05-30 NOTE — Progress Notes (Signed)
Office Visit Note   Patient: Julian Brown           Date of Birth: [birth year]           MRN: [id number] Visit Date: 05/30/2016              Requested by: Prince Solian, MD [address] [place], [place] [address] PCP: Tivis Ringer, MD   Assessment & Plan: Visit Diagnoses:  1. Chronic pain of right knee   2. Unilateral primary osteoarthritis, right knee     Plan: He tolerated the injection in his right knee well. I gave him handouts on hyaluronic acid which would be the next step for him to try. 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   Orders:  No orders of the defined types were placed in this encounter.  No orders of the defined types were placed in this encounter.     Procedures: Large Joint Inj Date/Time: 05/30/2016 10:49 AM Performed by: Mcarthur Rossetti Authorized by: Mcarthur Rossetti   Location:  Knee Ultrasound Guidance: No   Fluoroscopic Guidance: No   Arthrogram: No   Medications:  3 mL lidocaine 1 %; 40 mg methylPREDNISolone acetate 40 MG/ML     Clinical Data: No additional findings.   Subjective: Chief Complaint  Patient presents with  . Right Knee - Pain    Patient states knee is really bothering him again. He had xrays this morning on his knee    HPI That I have not seen along. Time. He developed significant knee swelling in his knee about a week or 2 ago and is going down with ice packs. Denies any locking catching but he points the medial side of his knees source of his pain. He is ambulating with a cane today but normally does not use any type of assistive device. He can be 10 on 10 at times it hurts mainly on the medial side with pivoting types of activities. Review of Systems He denies any headache, chest pain, shortness of breath, fever, chills, nausea, vomiting.  Objective: Vital Signs: There were no  vitals taken for this visit.  Physical Exam He is alert and oriented 3 in no acute distress Ortho Exam Examination of his right knee today shows maybe a mild effusion. He has a slight varus deformity that easily correctable. His pain is mainly on the medial joint line. There is no significant patellofemoral crepitation is ligaments exam is normal. It is deathly painful though with a positive Murray sign on the medial side. Specialty Comments:  No specialty comments available.  Imaging: Dg Knee 1-2 Views Right  Result Date: 05/30/2016 CLINICAL DATA:  Right knee pain for several months, no known injury, initial encounter EXAM: RIGHT KNEE - 2 VIEW COMPARISON:  None. FINDINGS: Medial joint space narrowing with osteophytic changes are seen. Mild degenerative changes in the patellofemoral space are noted as well. No acute fracture or dislocation is noted. No joint effusion is seen. IMPRESSION: Degenerative change without acute abnormality. Electronically Signed   By: Inez Catalina M.D.   On: 05/30/2016 10:41     PMFS History: There are no active problems to display for this patient.  Past Medical History:  Diagnosis Date  . Arthritis   . Cancer    prostate cancer-tx radiation  . ED (erectile dysfunction)   . Hyperlipemia   . Hypertension   .  Wears glasses     No family history on file.  Past Surgical History:  Procedure Laterality Date  . APPENDECTOMY    . CARPAL TUNNEL RELEASE  5/12   lt  . CARPAL TUNNEL RELEASE  12/13/2011   Procedure: CARPAL TUNNEL RELEASE;  Surgeon: [name]., MD;  Location: [facility];  Service: Orthopedics;  Laterality: Right;  and inject right wrist  . COLON SURGERY  1997   hemicolectomy-rt-ca  . COLONOSCOPY     Social History   Occupational History  . Not on file.   Social History Main Topics  . Smoking status: Former Smoker    Quit date: 12/09/1986  . Smokeless tobacco: Not on file  . Alcohol use Yes     Comment: occ  .  Drug use: No  . Sexual activity: Not on file

## 2016-06-27 ENCOUNTER — Encounter (INDEPENDENT_AMBULATORY_CARE_PROVIDER_SITE_OTHER): Payer: Self-pay | Admitting: Orthopaedic Surgery

## 2016-06-27 ENCOUNTER — Ambulatory Visit (INDEPENDENT_AMBULATORY_CARE_PROVIDER_SITE_OTHER): Payer: Medicare Other | Admitting: Orthopaedic Surgery

## 2016-06-27 DIAGNOSIS — M25531 Pain in right wrist: Secondary | ICD-10-CM

## 2016-06-27 DIAGNOSIS — G8929 Other chronic pain: Secondary | ICD-10-CM

## 2016-06-27 DIAGNOSIS — M1711 Unilateral primary osteoarthritis, right knee: Secondary | ICD-10-CM

## 2016-06-27 DIAGNOSIS — M25561 Pain in right knee: Secondary | ICD-10-CM

## 2016-06-27 DIAGNOSIS — M25562 Pain in left knee: Secondary | ICD-10-CM

## 2016-06-27 DIAGNOSIS — M1712 Unilateral primary osteoarthritis, left knee: Secondary | ICD-10-CM | POA: Diagnosis not present

## 2016-06-27 MED ORDER — METHYLPREDNISOLONE ACETATE 40 MG/ML IJ SUSP
40.0000 mg | INTRAMUSCULAR | Status: AC | PRN
  Administered 2016-06-27: 40 mg via INTRA_ARTICULAR

## 2016-06-27 MED ORDER — HYALURONAN 88 MG/4ML IX SOSY
88.0000 mg | PREFILLED_SYRINGE | INTRA_ARTICULAR | Status: AC | PRN
  Administered 2016-06-27: 88 mg via INTRA_ARTICULAR

## 2016-06-27 MED ORDER — LIDOCAINE HCL 1 % IJ SOLN
2.0000 mL | INTRAMUSCULAR | Status: AC | PRN
  Administered 2016-06-27: 2 mL

## 2016-06-27 NOTE — Progress Notes (Signed)
Office Visit Note   Patient: Julian Brown           Date of Birth: 26-Nov-1932           MRN: [id number] Visit Date: 06/27/2016              Requested by: Prince Solian, MD [address] [place], [place] [address] PCP: Tivis Ringer, MD   Assessment & Plan: Visit Diagnoses:  1. Chronic pain of right knee   2. Chronic pain of left knee   3. Unilateral primary osteoarthritis, left knee   4. Unilateral primary osteoarthritis, right knee   5. Pain in right wrist     Plan: He tolerated the steroid injection in his right wrist as well as the hyaluronic acid injection in both knees quite well. We'll see him back in 2 months to see how is doing overall.  Follow-Up Instructions: Return in about 2 months (around 08/25/2016).   Orders:  Orders Placed This Encounter  Procedures  . Medium Joint Injection/Arthrocentesis  . Large Joint Injection/Arthrocentesis  . Large Joint Injection/Arthrocentesis   No orders of the defined types were placed in this encounter.     Procedures: Medium Joint Inj Date/Time: 06/27/2016 2:58 PM Performed by: Mcarthur Rossetti Authorized by: Jean Rosenthal Y   Location:  Wrist Site:  R radiocarpal Ultrasound Guided: No   Fluoroscopic Guidance: No   Medications:  2 mL lidocaine 1 %; 40 mg methylPREDNISolone acetate 40 MG/ML  Large Joint Inj Date/Time: 06/27/2016 3:05 PM Performed by: Mcarthur Rossetti Authorized by: Mcarthur Rossetti   Location:  Knee Site:  R knee Ultrasound Guidance: No   Fluoroscopic Guidance: No   Arthrogram: No   Medications:  88 mg Hyaluronan 88 MG/4ML Large Joint Inj Date/Time: 06/27/2016 3:05 PM Performed by: Mcarthur Rossetti Authorized by: Mcarthur Rossetti   Location:  Knee Site:  L knee Ultrasound Guidance: No   Fluoroscopic Guidance: No   Arthrogram: No   Medications:  88 mg Hyaluronan 88 MG/4ML     Clinical Data: No additional  findings.   Subjective: Chief Complaint  Patient presents with  . Right Knee - Pain, Follow-up  . Right Wrist - Pain  . Left Wrist - Pain   He says his right knee has done well with the injection but noted both knees hurt. He would like to consider hyaluronic acid in both these knees. He also points to his right wrist since had swelling and pain in his right wrist for some time now. HPI  Review of Systems He denies a history of gout. He does report some joint swelling. Denies any chest pain, headache, fever or chills or shortness of breath  Objective: Vital Signs: There were no vitals taken for this visit.  Physical Exam He is alert and oriented 3 and in no acute distress Ortho Exam Both knees have just a mild effusion with good range of motion. Both knees have patellofemoral crepitation and slight varus deformity. His right wrist does show some dorsal swelling and global pain with good range of motion. Specialty Comments:  No specialty comments available.  Imaging: No results found.   PMFS History: There are no active problems to display for this patient.  Past Medical History:  Diagnosis Date  . Arthritis   . Cancer [facility])    prostate cancer-tx radiation  . ED (erectile dysfunction)   . Hyperlipemia   . Hypertension   . Wears glasses     No family  history on file.  Past Surgical History:  Procedure Laterality Date  . APPENDECTOMY    . CARPAL TUNNEL RELEASE  5/12   lt  . CARPAL TUNNEL RELEASE  12/13/2011   Procedure: CARPAL TUNNEL RELEASE;  Surgeon: [name]., MD;  Location: [facility];  Service: Orthopedics;  Laterality: Right;  and inject right wrist  . COLON SURGERY  1997   hemicolectomy-rt-ca  . COLONOSCOPY     Social History   Occupational History  . Not on file.   Social History Main Topics  . Smoking status: Former Smoker    Quit date: 12/09/1986  . Smokeless tobacco: Not on file  . Alcohol use Yes     Comment: occ  .  Drug use: No  . Sexual activity: Not on file

## 2016-08-25 ENCOUNTER — Ambulatory Visit (INDEPENDENT_AMBULATORY_CARE_PROVIDER_SITE_OTHER): Payer: Medicare Other | Admitting: Orthopaedic Surgery

## 2016-08-25 ENCOUNTER — Encounter (INDEPENDENT_AMBULATORY_CARE_PROVIDER_SITE_OTHER): Payer: Self-pay | Admitting: Orthopaedic Surgery

## 2016-08-25 DIAGNOSIS — M25531 Pain in right wrist: Secondary | ICD-10-CM

## 2016-08-25 DIAGNOSIS — M25532 Pain in left wrist: Secondary | ICD-10-CM

## 2016-08-25 MED ORDER — METHYLPREDNISOLONE ACETATE 40 MG/ML IJ SUSP
40.0000 mg | INTRAMUSCULAR | Status: AC | PRN
  Administered 2016-08-25: 40 mg via INTRA_ARTICULAR

## 2016-08-25 NOTE — Progress Notes (Signed)
Office Visit Note   Patient: Julian Brown           Date of Birth: 02-Apr-1933           MRN: [id number] Visit Date: 08/25/2016              Requested by: Prince Solian, MD [address] [place], [place] [address] PCP: Tivis Ringer, MD   Assessment & Plan: Visit Diagnoses:  1. Pain in left wrist   2. Pain in right wrist     Plan: He now he is interested more in the pain in his wrist and having injections in both his wrists. Right now I also palpate anything else for his knees. We'll see him back in a month to see how both wrists in the knees are doing. He tolerated the injections in both wrists well.  Follow-Up Instructions: Return in about 4 weeks (around 09/22/2016).   Orders:  No orders of the defined types were placed in this encounter.  No orders of the defined types were placed in this encounter.     Procedures: Medium Joint Inj Date/Time: 08/25/2016 3:45 PM Performed by: Mcarthur Rossetti Authorized by: Jean Rosenthal Y   Indications:  Pain Location:  Wrist Site:  R radiocarpal Ultrasound Guided: No   Fluoroscopic Guidance: No   Medications:  40 mg methylPREDNISolone acetate 40 MG/ML Medium Joint Inj Date/Time: 08/25/2016 3:45 PM Performed by: Mcarthur Rossetti Authorized by: Mcarthur Rossetti   Location:  Wrist Site:  L radiocarpal Ultrasound Guided: No   Fluoroscopic Guidance: No   Medications:  40 mg methylPREDNISolone acetate 40 MG/ML     Clinical Data: No additional findings.   Subjective: Chief Complaint  Patient presents with  . Left Knee - Pain, Follow-up  . Right Knee - Pain, Follow-up  The patient is following up after having bilateral knee injections back in January of hyaluronic acid. Both these are still swelling causing pain but he says risks are was hurting worse today. He has known arthritis in both of his knees.   HPI  Review of Systems Currently denies any fever, chills, nausea, vomiting. He  denies any history gout. He is not a diabetic.  Objective: Vital Signs: There were no vitals taken for this visit.  Physical Exam His alert or 3 in no acute distress Ortho Exam Relation of his right left knee show mild effusion of both knees with good range of motion but are both painful. They have both a slight varus deformity. Examination of both wrists show pain with flexion extension with no significant effusion. Specialty Comments:  No specialty comments available.  Imaging: No results found.   PMFS History: There are no active problems to display for this patient.  Past Medical History:  Diagnosis Date  . Arthritis   . Cancer [facility])    prostate cancer-tx radiation  . ED (erectile dysfunction)   . Hyperlipemia   . Hypertension   . Wears glasses     No family history on file.  Past Surgical History:  Procedure Laterality Date  . APPENDECTOMY    . CARPAL TUNNEL RELEASE  5/12   lt  . CARPAL TUNNEL RELEASE  12/13/2011   Procedure: CARPAL TUNNEL RELEASE;  Surgeon: [name]., MD;  Location: [facility];  Service: Orthopedics;  Laterality: Right;  and inject right wrist  . COLON SURGERY  1997   hemicolectomy-rt-ca  . COLONOSCOPY     Social History   Occupational History  .  Not on file.   Social History Main Topics  . Smoking status: Former Smoker    Quit date: 12/09/1986  . Smokeless tobacco: Never Used  . Alcohol use Yes     Comment: occ  . Drug use: No  . Sexual activity: Not on file

## 2016-09-21 ENCOUNTER — Ambulatory Visit (INDEPENDENT_AMBULATORY_CARE_PROVIDER_SITE_OTHER): Payer: Medicare Other | Admitting: Orthopaedic Surgery

## 2016-09-21 DIAGNOSIS — M25562 Pain in left knee: Secondary | ICD-10-CM | POA: Diagnosis not present

## 2016-09-21 DIAGNOSIS — M25561 Pain in right knee: Secondary | ICD-10-CM | POA: Diagnosis not present

## 2016-09-21 DIAGNOSIS — G8929 Other chronic pain: Secondary | ICD-10-CM | POA: Diagnosis not present

## 2016-09-21 NOTE — Progress Notes (Signed)
Blasius on up a month after having bilateral wrist steroid injections. He says that is helped by a bit. He is someone who is now getting close to 3 months out from hyaluronic acid injections in both his knees. He said that is helping as well. He does feel some point he like to have steroid injection maybe about summertime but not today.  On examination both knees have just a mild effusion with good range of motion and have just a little bit of pain.  We'll see him back in about 3 months see how is doing overall. At that point we can always place steroid injections in order hyaluronic acid injections again. We can always see him sooner for steroid injections in these knees if needed.

## 2016-12-21 ENCOUNTER — Ambulatory Visit (INDEPENDENT_AMBULATORY_CARE_PROVIDER_SITE_OTHER): Payer: Medicare Other | Admitting: Orthopaedic Surgery

## 2017-01-04 ENCOUNTER — Ambulatory Visit (INDEPENDENT_AMBULATORY_CARE_PROVIDER_SITE_OTHER): Payer: Medicare Other | Admitting: Orthopaedic Surgery

## 2017-01-04 DIAGNOSIS — M1712 Unilateral primary osteoarthritis, left knee: Secondary | ICD-10-CM | POA: Diagnosis not present

## 2017-01-04 DIAGNOSIS — M25562 Pain in left knee: Secondary | ICD-10-CM

## 2017-01-04 DIAGNOSIS — M25561 Pain in right knee: Secondary | ICD-10-CM

## 2017-01-04 DIAGNOSIS — G8929 Other chronic pain: Secondary | ICD-10-CM

## 2017-01-04 DIAGNOSIS — M25531 Pain in right wrist: Secondary | ICD-10-CM | POA: Diagnosis not present

## 2017-01-04 DIAGNOSIS — M1711 Unilateral primary osteoarthritis, right knee: Secondary | ICD-10-CM | POA: Diagnosis not present

## 2017-01-04 MED ORDER — METHYLPREDNISOLONE ACETATE 40 MG/ML IJ SUSP
40.0000 mg | INTRAMUSCULAR | Status: AC | PRN
  Administered 2017-01-04: 40 mg via INTRA_ARTICULAR

## 2017-01-04 MED ORDER — LIDOCAINE HCL 1 % IJ SOLN
1.0000 mL | INTRAMUSCULAR | Status: AC | PRN
  Administered 2017-01-04: 1 mL

## 2017-01-04 NOTE — Progress Notes (Signed)
Office Visit Note   Patient: Julian Brown           Date of Birth: 12/02/1932           MRN: [id number] Visit Date: 01/04/2017              Requested by: Prince Solian, MD [address] [place], [place] [address] PCP: Prince Solian, MD   Assessment & Plan: Visit Diagnoses:  1. Pain in right wrist   2. Chronic pain of left knee   3. Chronic pain of right knee   4. Unilateral primary osteoarthritis, left knee   5. Unilateral primary osteoarthritis, right knee     Plan: We will order Monovisc for both knees And see him back for those injections once they're approved and they get here. Also talked about trying a steroid injection in his right wrist and he is amenable to this. We had a long and thorough discussion about risks injections and steroids general. QUESTIONS WERE ENCOURAGED AND ANSWERED.  Follow-Up Instructions: Return in about 4 weeks (around 02/01/2017).   Orders:  No orders of the defined types were placed in this encounter.  No orders of the defined types were placed in this encounter.     Procedures: Medium Joint Inj Date/Time: 01/04/2017 2:54 PM Performed by: Mcarthur Rossetti Authorized by: Mcarthur Rossetti   Location:  Wrist Site:  R radiocarpal Ultrasound Guided: No   Fluoroscopic Guidance: No   Medications:  1 mL lidocaine 1 %; 40 mg methylPREDNISolone acetate 40 MG/ML     Clinical Data: No additional findings.   Subjective: No chief complaint on file. Patient comes in today for continued follow-up of bilateral knee arthritis left worse than right. He had hyaluronic acid injections back in January and is been over 6 months now and is doing well. He like to have those injections again. He also may take a look at his right wrist was is been painful to him for long period time with no significant trauma was done a lot of work with his hands over time. He is 83 years old. Both knees are manageable in terms of pain but he feels the  gel injections of helped him get some of his life back  HPI  Review of Systems He currently denies any headache, chest pain, shortness of breath, fever, chills, nausea, vomiting  Objective: Vital Signs: There were no vitals taken for this visit.  Physical Exam He is alert or 3 and in no acute distress Ortho Exam Exam emanation both knee show mild varus malalignment. Both knees have slight flexion contractures of painful range of motion. Both knees show significant arthritic changes. Examination of his right wrist shows pain with flexion-extension office with arthritic changes at the radiocarpal joint Specialty Comments:  No specialty comments available.  Imaging: No results found.   PMFS History: Patient Active Problem List   Diagnosis Date Noted  . Pain in right wrist 01/04/2017  . Chronic pain of left knee 01/04/2017  . Chronic pain of right knee 01/04/2017  . Unilateral primary osteoarthritis, left knee 01/04/2017  . Unilateral primary osteoarthritis, right knee 01/04/2017   Past Medical History:  Diagnosis Date  . Arthritis   . Cancer [facility])    prostate cancer-tx radiation  . ED (erectile dysfunction)   . Hyperlipemia   . Hypertension   . Wears glasses     No family history on file.  Past Surgical History:  Procedure Laterality Date  . APPENDECTOMY    .  CARPAL TUNNEL RELEASE  5/12   lt  . CARPAL TUNNEL RELEASE  12/13/2011   Procedure: CARPAL TUNNEL RELEASE;  Surgeon: [name]., MD;  Location: [facility];  Service: Orthopedics;  Laterality: Right;  and inject right wrist  . COLON SURGERY  1997   hemicolectomy-rt-ca  . COLONOSCOPY     Social History   Occupational History  . Not on file.   Social History Main Topics  . Smoking status: Former Smoker    Quit date: 12/09/1986  . Smokeless tobacco: Never Used  . Alcohol use Yes     Comment: occ  . Drug use: No  . Sexual activity: Not on file

## 2017-02-06 ENCOUNTER — Ambulatory Visit (INDEPENDENT_AMBULATORY_CARE_PROVIDER_SITE_OTHER): Payer: Medicare Other | Admitting: Orthopaedic Surgery

## 2017-02-06 ENCOUNTER — Encounter (INDEPENDENT_AMBULATORY_CARE_PROVIDER_SITE_OTHER): Payer: Self-pay | Admitting: Orthopaedic Surgery

## 2017-02-06 DIAGNOSIS — M1711 Unilateral primary osteoarthritis, right knee: Secondary | ICD-10-CM | POA: Diagnosis not present

## 2017-02-06 DIAGNOSIS — M1712 Unilateral primary osteoarthritis, left knee: Secondary | ICD-10-CM

## 2017-02-06 MED ORDER — HYALURONAN 88 MG/4ML IX SOSY
88.0000 mg | PREFILLED_SYRINGE | INTRA_ARTICULAR | Status: AC | PRN
  Administered 2017-02-06: 88 mg via INTRA_ARTICULAR

## 2017-02-06 NOTE — Progress Notes (Signed)
   Procedure Note  Patient: Julian Brown             Date of Birth: [birth year]           MRN: [id number]             Visit Date: 02/06/2017  Procedures: Visit Diagnoses: Unilateral primary osteoarthritis, left knee  Unilateral primary osteoarthritis, right knee  Large Joint Inj Date/Time: 02/06/2017 2:23 PM Performed by: Mcarthur Rossetti Authorized by: Mcarthur Rossetti   Location:  Knee Site:  R knee Ultrasound Guidance: No   Fluoroscopic Guidance: No   Arthrogram: No   Medications:  88 mg Hyaluronan 88 MG/4ML Large Joint Inj Date/Time: 02/06/2017 2:24 PM Performed by: Mcarthur Rossetti Authorized by: Mcarthur Rossetti   Location:  Knee Site:  L knee Ultrasound Guidance: No   Fluoroscopic Guidance: No   Arthrogram: No   Medications:  88 mg Hyaluronan 88 MG/4ML   The patient is here for scheduled hyaluronic acid injections in both knees. He is already had steroid injections and noted and well-documented moderate arthritis of both his knees. We've explained in detail the risk and benefits of these injections and the rationale behind it and he does wish to have these done.  On exam both knees have just a mild effusion. Both knees have slight varus malalignment with good range of motion and some global tenderness.  He tolerated both injections well. At this point he'll follow up as needed but understands he can always have steroid injections in the fall of this year if needed.

## 2017-03-03 ENCOUNTER — Other Ambulatory Visit: Payer: Self-pay | Admitting: Internal Medicine

## 2017-03-03 DIAGNOSIS — R918 Other nonspecific abnormal finding of lung field: Secondary | ICD-10-CM

## 2017-03-08 ENCOUNTER — Inpatient Hospital Stay
Admission: RE | Admit: 2017-03-08 | Discharge: 2017-03-08 | Disposition: A | Discharge status: Home / Self Care | Payer: Medicare Other | Source: Ambulatory Visit | Attending: Internal Medicine | Admitting: Internal Medicine

## 2017-03-23 ENCOUNTER — Ambulatory Visit
Admission: RE | Admit: 2017-03-23 | Discharge: 2017-03-23 | Disposition: A | Discharge status: Home / Self Care | Payer: Medicare Other | Source: Ambulatory Visit | Attending: Internal Medicine | Admitting: Internal Medicine

## 2017-03-23 DIAGNOSIS — R918 Other nonspecific abnormal finding of lung field: Secondary | ICD-10-CM

## 2017-03-23 MED ORDER — IOPAMIDOL (ISOVUE-300) INJECTION 61%
75.0000 mL | Freq: Once | INTRAVENOUS | Status: AC | PRN
  Administered 2017-03-23: 75 mL via INTRAVENOUS

## 2017-04-11 ENCOUNTER — Other Ambulatory Visit: Payer: Self-pay | Admitting: Internal Medicine

## 2017-04-11 DIAGNOSIS — E041 Nontoxic single thyroid nodule: Secondary | ICD-10-CM

## 2017-04-14 ENCOUNTER — Other Ambulatory Visit: Payer: Medicare Other

## 2017-04-19 ENCOUNTER — Ambulatory Visit
Admission: RE | Admit: 2017-04-19 | Discharge: 2017-04-19 | Disposition: A | Discharge status: Home / Self Care | Payer: Medicare Other | Source: Ambulatory Visit | Attending: Internal Medicine | Admitting: Internal Medicine

## 2017-04-19 DIAGNOSIS — E041 Nontoxic single thyroid nodule: Secondary | ICD-10-CM

## 2017-04-26 ENCOUNTER — Other Ambulatory Visit: Payer: Self-pay | Admitting: Internal Medicine

## 2017-04-26 DIAGNOSIS — E042 Nontoxic multinodular goiter: Secondary | ICD-10-CM

## 2017-05-03 ENCOUNTER — Other Ambulatory Visit: Payer: Medicare Other

## 2017-05-03 ENCOUNTER — Ambulatory Visit
Admission: RE | Admit: 2017-05-03 | Discharge: 2017-05-03 | Disposition: A | Discharge status: Home / Self Care | Payer: Medicare Other | Source: Ambulatory Visit | Attending: Internal Medicine | Admitting: Internal Medicine

## 2017-05-03 ENCOUNTER — Other Ambulatory Visit (HOSPITAL_COMMUNITY)
Admission: RE | Admit: 2017-05-03 | Discharge: 2017-05-03 | Disposition: A | Discharge status: Home / Self Care | Payer: Medicare Other | Source: Ambulatory Visit | Attending: Radiology | Admitting: Radiology

## 2017-05-03 DIAGNOSIS — E042 Nontoxic multinodular goiter: Secondary | ICD-10-CM | POA: Insufficient documentation

## 2017-06-05 ENCOUNTER — Emergency Department (HOSPITAL_COMMUNITY): Payer: Medicare Other

## 2017-06-05 ENCOUNTER — Emergency Department (HOSPITAL_COMMUNITY)
Admission: EM | Admit: 2017-06-05 | Discharge: 2017-06-05 | Disposition: A | Discharge status: Home / Self Care | Payer: Medicare Other | Source: Home / Self Care | Attending: Emergency Medicine | Admitting: Emergency Medicine

## 2017-06-05 ENCOUNTER — Encounter (HOSPITAL_COMMUNITY): Payer: Self-pay | Admitting: Emergency Medicine

## 2017-06-05 DIAGNOSIS — I10 Essential (primary) hypertension: Secondary | ICD-10-CM

## 2017-06-05 DIAGNOSIS — F17200 Nicotine dependence, unspecified, uncomplicated: Secondary | ICD-10-CM | POA: Insufficient documentation

## 2017-06-05 DIAGNOSIS — R140 Abdominal distension (gaseous): Secondary | ICD-10-CM | POA: Insufficient documentation

## 2017-06-05 DIAGNOSIS — R109 Unspecified abdominal pain: Secondary | ICD-10-CM | POA: Diagnosis not present

## 2017-06-05 DIAGNOSIS — K8000 Calculus of gallbladder with acute cholecystitis without obstruction: Secondary | ICD-10-CM | POA: Diagnosis not present

## 2017-06-05 DIAGNOSIS — Z79899 Other long term (current) drug therapy: Secondary | ICD-10-CM | POA: Insufficient documentation

## 2017-06-05 DIAGNOSIS — K828 Other specified diseases of gallbladder: Secondary | ICD-10-CM

## 2017-06-05 DIAGNOSIS — Z8546 Personal history of malignant neoplasm of prostate: Secondary | ICD-10-CM

## 2017-06-05 DIAGNOSIS — K5900 Constipation, unspecified: Secondary | ICD-10-CM

## 2017-06-05 LAB — COMPREHENSIVE METABOLIC PANEL
ALT: 20 U/L (ref 17–63)
AST: 26 U/L (ref 15–41)
Albumin: 4.1 g/dL (ref 3.5–5.0)
Alkaline Phosphatase: 54 U/L (ref 38–126)
Anion gap: 9 (ref 5–15)
BUN: 19 mg/dL (ref 6–20)
CO2: 25 mmol/L (ref 22–32)
Calcium: 10.1 mg/dL (ref 8.9–10.3)
Chloride: 100 mmol/L — ABNORMAL LOW (ref 101–111)
Creatinine, Ser: 1.12 mg/dL (ref 0.61–1.24)
GFR calc Af Amer: >60 mL/min (ref >60)
GFR calc non Af Amer: 59 mL/min — ABNORMAL LOW (ref >60)
Glucose, Bld: 139 mg/dL — ABNORMAL HIGH (ref 65–99)
Potassium: 4 mmol/L (ref 3.5–5.1)
Sodium: 134 mmol/L — ABNORMAL LOW (ref 135–145)
Total Bilirubin: 0.9 mg/dL (ref 0.3–1.2)
Total Protein: 7.3 g/dL (ref 6.5–8.1)

## 2017-06-05 LAB — CBC
HCT: 48 % (ref 39.0–52.0)
Hemoglobin: 16.7 g/dL (ref 13.0–17.0)
MCH: 34.9 pg — ABNORMAL HIGH (ref 26.0–34.0)
MCHC: 34.8 g/dL (ref 30.0–36.0)
MCV: 100.2 fL — ABNORMAL HIGH (ref 78.0–100.0)
Platelets: 238 10*3/uL (ref 150–400)
RBC: 4.79 MIL/uL (ref 4.22–5.81)
RDW: 13.2 % (ref 11.5–15.5)
WBC: 10.1 10*3/uL (ref 4.0–10.5)

## 2017-06-05 LAB — LIPASE, BLOOD: Lipase: 41 U/L (ref 11–51)

## 2017-06-05 MED ORDER — GLYCERIN (ADULT) 2 G RE SUPP: 1.0000 | RECTAL | 0 refills | Status: DC | PRN

## 2017-06-05 MED ORDER — IOPAMIDOL (ISOVUE-300) INJECTION 61%
INTRAVENOUS | Status: AC
  Administered 2017-06-05: 100 mL via INTRAVENOUS
  Filled 2017-06-05: qty 100

## 2017-06-05 MED ORDER — FENTANYL CITRATE (PF) 100 MCG/2ML IJ SOLN
50.0000 ug | Freq: Once | INTRAMUSCULAR | Status: AC
  Administered 2017-06-05: 50 ug via INTRAVENOUS
  Filled 2017-06-05: qty 2

## 2017-06-05 NOTE — ED Notes (Signed)
Ultrasound at bedside

## 2017-06-05 NOTE — ED Notes (Signed)
Bed: WA12 Expected date:  Expected time:  Means of arrival:  Comments: Ems-abdominal pain 

## 2017-06-05 NOTE — ED Notes (Signed)
Patient made aware of pending urine sample. Patient and family member instructed to call out when patient uses urinal (at the bedside)

## 2017-06-05 NOTE — Discharge Instructions (Signed)
Please use your Breo inhaler for 1 dose when you get home as directed.  There are several treatments that you can use for constipation.  Increasing the amount of water you drink, the amount of fiber in your diet, and increasing your level of activity or exercise are all to help your body to stimulate having a bowel movement.  Try to drink at least 8 glasses of water with at least 8 ounces per day.  Metamucil as a fiber supplement and is available over-the-counter. Follow the direction on the label for use, but most of the time you can mix 2.5g in 8 ounces of water 1-2 times per day.   You can mix 17 g (~1 heaping teaspoon) of Miralax in 8 ounces of water up to 3 times per day.  As you start to have a soft bowel movement, you can cut back from 3 times to 2 times to once per day and eventually cut the dose in half.  These note, if used too much MiraLAX you can go from having hard bowel movements to having diarrhea so cutting the dose back as your bowel movements soften can prevent this.   You can take 1 glycerin suppository by rectum no more than 1 time per day if you do not have a bowel movement in the next 24-48 hours.  If you develop new or worsening symptoms, including a fever, vomiting, a change in the blood in your stool, or severe abdominal pain, he is return to the emergency department for re-evaluation.

## 2017-06-05 NOTE — ED Provider Notes (Signed)
[place] DEPT Provider Note   CSN: [id number] Arrival date & time: 06/05/17  0348     History   Chief Complaint Chief Complaint  Patient presents with  . Abdominal Pain  . Constipation    HPI Julian Brown is a 83 y.o. male with a h/o of prostate and colon CA, HTN who presents to the emergency department with a chief complaint of constipation with associated abdominal bloating and abdominal pain that began yesterday afternoon.  The patient's wife reports that he ate a large dinner with family yesterday afternoon, while they were out shopping she noticed that her husband disappeared for some time and later discovered that he had been in the bathroom at the store for some time attempting to have a bowel movement.  He reports one small, hard brown bowel movement yesterday afternoon.  He reports a similar bowel movement 2 days ago.  He attempted to treat his symptoms with a small amount of milk of magnesia last night without improvement.  He characterizes his constant, worsening abdominal pain "pressure, like I need to have a BM".  His pain is worse with sitting upright and improved with lying flat. He denies nausea, vomiting, back pain, chest pain, shortness of breath, recent weight gain, dysuria, frequency, flank pain, hematuria, melena, or testicular or penile pain.  His wife states that he has struggled with constipation "on and off" in the past.  He reports intermittent use of milk of magnesia over several months. He states he "had a little last night". He does not currently take any opioids.  He has a history of hematochezia at baseline that has been worked up outpatient and is likely secondary to radiation for prostate cancer.  Surgerical hx includes appendectomy and colon surgery.  Is followed by Dr. Cristina Gong.  The history is provided by the patient. No language interpreter was used.    Past Medical History:  Diagnosis Date  . Arthritis   . Cancer  [facility])    prostate cancer-tx radiation  . ED (erectile dysfunction)   . Hyperlipemia   . Hypertension   . Wears glasses     Patient Active Problem List   Diagnosis Date Noted  . Pain in right wrist 01/04/2017  . Chronic pain of left knee 01/04/2017  . Chronic pain of right knee 01/04/2017  . Unilateral primary osteoarthritis, left knee 01/04/2017  . Unilateral primary osteoarthritis, right knee 01/04/2017    Past Surgical History:  Procedure Laterality Date  . APPENDECTOMY    . CARPAL TUNNEL RELEASE  5/12   lt  . CARPAL TUNNEL RELEASE  12/13/2011   Procedure: CARPAL TUNNEL RELEASE;  Surgeon: [name]., MD;  Location: [facility];  Service: Orthopedics;  Laterality: Right;  and inject right wrist  . COLON SURGERY  1997   hemicolectomy-rt-ca  . COLONOSCOPY         Home Medications    Prior to Admission medications   Medication Sig Start Date End Date Taking? Authorizing Provider  acetaminophen (TYLENOL) 500 MG tablet Take 1,000 mg by mouth every 6 (six) hours as needed for mild pain.   Yes [provider]  amLODipine (NORVASC) 10 MG tablet Take 10 mg by mouth daily.   Yes [provider]  aspirin EC 81 MG tablet Take 81 mg by mouth daily.   Yes [provider]  cholecalciferol (VITAMIN D) 1000 UNITS tablet Take 2,000 Units by mouth daily.    Yes [provider]  fish oil-omega-3 fatty acids 1000 MG capsule Take 1 g by mouth daily.    Yes [provider]  fluticasone (FLONASE) 50 MCG/ACT nasal spray Place 1 spray into both nostrils daily.  05/10/17  Yes [provider]  fluticasone furoate-vilanterol (BREO ELLIPTA) 100-25 MCG/INH AEPB Inhale 1 puff into the lungs daily.   Yes [provider]  magnesium hydroxide (MILK OF MAGNESIA) 400 MG/5ML suspension Take 5 mLs by mouth daily as needed for mild constipation.   Yes [provider]  metoprolol tartrate (LOPRESSOR) 25 MG tablet Take 25  mg by mouth 2 (two) times daily.   Yes [provider]  olmesartan-hydrochlorothiazide (BENICAR HCT) 40-12.5 MG tablet Take 1 tablet by mouth daily.   Yes [provider]  rosuvastatin (CRESTOR) 10 MG tablet Take 10 mg by mouth daily.   Yes [provider]  cloNIDine (CATAPRES) 0.1 MG tablet Take 1 tablet (0.1 mg total) by mouth 3 (three) times daily as needed (BP greater than 591 systolic, or 638 diastolic). Patient not taking: Reported on 46/65/9935 12/12/75   Delora Fuel, MD  glycerin adult 2 g suppository Place 1 suppository rectally as needed for constipation. 06/05/17   Janean Eischen, Maree Erie A, PA-C    Family History No family history on file.  Social History Social History   Tobacco Use  . Smoking status: Former Smoker    Last attempt to quit: 12/09/1986    Years since quitting: 30.5  . Smokeless tobacco: Never Used  Substance Use Topics  . Alcohol use: Yes    Comment: occ  . Drug use: No     Allergies   Penicillins   Review of Systems Review of Systems  Constitutional: Negative for activity change, chills and fever.  Eyes: Negative for visual disturbance.  Respiratory: Negative for shortness of breath.   Cardiovascular: Negative for chest pain.  Gastrointestinal: Positive for abdominal distention and blood in stool (chronic 2/2 radiation). Negative for diarrhea, nausea and vomiting.  Genitourinary: Negative for flank pain, hematuria, penile pain and urgency.  Musculoskeletal: Negative for back pain.  Skin: Negative for rash.  Allergic/Immunologic: Negative for immunocompromised state.  Neurological: Negative for dizziness, weakness, numbness and headaches.  Hematological: Does not bruise/bleed easily.  Psychiatric/Behavioral: Negative for confusion.   Physical Exam Updated Vital Signs BP 137/66 (BP Location: Left Arm)   Pulse 70   Temp 98 F (36.7 C) (Oral)   Resp 16   SpO2 91%   Physical Exam  Constitutional: He appears well-developed.   HENT:  Head: Normocephalic.  Eyes: Conjunctivae are normal.  Neck: Neck supple.  Cardiovascular: Normal rate, regular rhythm, normal heart sounds and intact distal pulses. Exam reveals no gallop and no friction rub.  No murmur heard. Pulmonary/Chest: Effort normal. No stridor. No respiratory distress. He has wheezes. He has no rales. He exhibits no tenderness.  Wheezing in the left base; lungs are otherwise clear to auscultation.  Abdominal: Soft. Bowel sounds are normal. He exhibits no shifting dullness, no distension, no fluid wave, no abdominal bruit, no ascites, no pulsatile midline mass and no mass. There is generalized tenderness and tenderness in the right upper quadrant. There is positive Murphy's sign. There is no rigidity, no rebound, no guarding and no CVA tenderness. A hernia is present.  Abdomen is distended. Moderate TTP in the RUQ.  No rebound or guarding.  Mild TTP in the LLQ. Questionable Murphy's sign, difficult to palpate due to body habitus.  No peritoneal signs.  There is a  well-healed vertical midline scar.  Musculoskeletal: He exhibits no edema, tenderness or deformity.  No lower extremity edema.  Neurological: He is alert.  Skin: Skin is warm and dry. Capillary refill takes less than 2 seconds. No rash noted.  Psychiatric: His behavior is normal.  Nursing note and vitals reviewed.    ED Treatments / Results  Labs (all labs ordered are listed, but only abnormal results are displayed) Labs Reviewed  COMPREHENSIVE METABOLIC PANEL - Abnormal; Notable for the following components:      Result Value   Sodium 134 (*)    Chloride 100 (*)    Glucose, Bld 139 (*)    GFR calc non Af Amer 59 (*)    All other components within normal limits  CBC - Abnormal; Notable for the following components:   MCV 100.2 (*)    MCH 34.9 (*)    All other components within normal limits  LIPASE, BLOOD  URINALYSIS, ROUTINE W REFLEX MICROSCOPIC    EKG  EKG Interpretation None         Radiology Dg Chest 2 View  Result Date: 06/05/2017 CLINICAL DATA:  Dyspnea EXAM: CHEST  2 VIEW COMPARISON:  CT chest dated 03/23/2017 FINDINGS: Increased interstitial markings. No focal consolidation. No pleural effusion or pneumothorax. The heart is top-normal in size. Osseous spurring overlying the lower thoracic spine on the lateral view. IMPRESSION: No evidence of acute cardiopulmonary disease. Electronically Signed   By: Julian Hy M.D.   On: 06/05/2017 11:12   Ct Abdomen Pelvis W Contrast  Result Date: 06/05/2017 CLINICAL DATA:  Abdominal pain, constipation, small hard stool is yesterday hypertension prostate cancer post chemo radiation therapy, appendectomy, hemicolectomy, former smoker EXAM: CT ABDOMEN AND PELVIS WITH CONTRAST TECHNIQUE: Multidetector CT imaging of the abdomen and pelvis was performed using the standard protocol following bolus administration of intravenous contrast. Sagittal and coronal MPR images reconstructed from axial data set. CONTRAST:  170mL ISOVUE-300 IOPAMIDOL (ISOVUE-300) INJECTION 61% IV. No oral contrast. COMPARISON:  None FINDINGS: Lower chest: Subsegmental atelectasis at both lung bases Hepatobiliary: Multiple hepatic cysts in both lobes. Largest cyst is in RIGHT lobe 3.8 x 2.9 cm image 30. Dependent calculi in gallbladder. No biliary dilatation. Pancreas: Normal appearance Spleen: Normal appearance Adrenals/Urinary Tract: Thickening of LEFT adrenal gland without discrete mass. Tiny LEFT renal cysts. Adrenal glands, kidneys, ureters, and bladder otherwise normal appearance. Stomach/Bowel: Prior RIGHT hemicolectomy. Stomach and bowel loops otherwise grossly normal appearance for technique. Vascular/Lymphatic: Atherosclerotic calcifications aorta and iliac arteries without aneurysm. No adenopathy. Reproductive: Minimal prostatic enlargement. Surgical clips versus seeds within prostate gland. Other: No free air or free fluid. No inflammatory process seen.  Tiny umbilical hernia containing fat. Musculoskeletal: Osseous demineralization with degenerative disc disease changes of the lumbar spine. IMPRESSION: Multiple hepatic cysts. Cholelithiasis. Tiny umbilical hernia containing fat. No acute intra-abdominal or intrapelvic process identified. Electronically Signed   By: Lavonia Dana M.D.   On: 06/05/2017 08:13   US Abdomen Limited Ruq  Result Date: 06/05/2017 CLINICAL DATA:  Abdominal pain and bloating for 1 day. EXAM: ULTRASOUND ABDOMEN LIMITED RIGHT UPPER QUADRANT COMPARISON:  CT from earlier today FINDINGS: Gallbladder: Numerous tiny gallstones with superimposed sludge. The gallbladder is full without visible wall thickening. No focal tenderness per sonographer exam. Common bile duct: Diameter: 4 mm. Limited CBD visualization with no noted filling defect Liver: Scattered hepatic cysts with overall benign appearance. These are underestimated relative to prior CT. The largest seen is 4.4 cm in the right liver. Portal vein is  patent on color Doppler imaging with normal direction of blood flow towards the liver. IMPRESSION: 1. Tiny gallstones and sludge nearly fill the gallbladder. No indication of acute cholecystitis. 2. Hepatic cysts. Electronically Signed   By: Monte Fantasia M.D.   On: 06/05/2017 09:58    Procedures Procedures (including critical care time)  Medications Ordered in ED Medications  iopamidol (ISOVUE-300) 61 % injection (100 mLs Intravenous Contrast Given 06/05/17 0742)  fentaNYL (SUBLIMAZE) injection 50 mcg (50 mcg Intravenous Given 06/05/17 0841)     Initial Impression / Assessment and Plan / ED Course  I have reviewed the triage vital signs and the nursing notes.  Pertinent labs & imaging results that were available during my care of the patient were reviewed by me and considered in my medical decision making (see chart for details).     84 year old male with a history of prostate cancer s/p radiation, colon CA s/p surgery,  and HTN who presents to the emergency department with abdominal pain, abdominal distention, and constipation. He reports a vague history of intermittent constipation in the past, but is concerned with the distension in his abdomen today.  Labs are unremarkable.  CT with multiple hepatic cysts, cholelithiasis, and a  umbilical hernia.  No acute intra-abdominal pelvis identified. RUQ [place] with tiny gallstones and sludge nearly filling the gallbladder, no cholecystitis. No trans-aminitis or hyperbilirubinemia. Discussed the patient with Dr. Kathrynn Humble and later Dr. Billy Fischer at shift changed.  Doubt intra-abdominal infection, including cholecystitis, diverticulitis, or new neoplasm.  No small or large bowel obstruction.  The patient is established with Dr. Cristina Gong and will have him follow-up regarding his ultrasound findings.  We will discharge the patient home with a bowel regimen of MiraLAX, Metamucil, and glycerin suppository if symptoms do not improve.  Discussed these findings at length with the patient, his wife, and his daughter who are agreeable with the workup and plan at this time.  SaO2 92% on room air, no dyspnea.  This appears to be his baseline encouraged use of his Breo inhaler at home.  Doubt acute cardiac or pulmonary process.  He is hemodynamically stable and in no acute distress.  Strict return precautions given.  The patient is safe for discharge at this time.  Final Clinical Impressions(s) / ED Diagnoses   Final diagnoses:  Abdominal pain  Constipation, unspecified constipation type  Sludge in gallbladder    ED Discharge Orders        Ordered    glycerin adult 2 g suppository  As needed     06/05/17 1158       Lawerence Dery A, PA-C 06/05/17 [place], Ankit, MD 06/06/17 [phone]

## 2017-06-05 NOTE — ED Notes (Signed)
Nurse emptied urinal without getting specimen.  Will have to wait for more urine

## 2017-06-05 NOTE — ED Triage Notes (Signed)
Patient here from home with complaints of abdominal pain, constipation. "very hard" stools. Last bowel movement yesturday "but very little". Denies nausea, vomiting.

## 2017-06-05 NOTE — ED Notes (Addendum)
Patient requesting pain medications. Made MIA PA aware. Awaiting new orders.

## 2017-06-07 ENCOUNTER — Encounter (HOSPITAL_COMMUNITY): Payer: Self-pay | Admitting: Emergency Medicine

## 2017-06-07 ENCOUNTER — Inpatient Hospital Stay (HOSPITAL_COMMUNITY)
Admission: EM | Admit: 2017-06-07 | Discharge: 2017-06-16 | DRG: 419 | Disposition: A | Discharge status: Home / Self Care | Payer: Medicare Other | Attending: General Surgery | Admitting: General Surgery

## 2017-06-07 DIAGNOSIS — K219 Gastro-esophageal reflux disease without esophagitis: Secondary | ICD-10-CM | POA: Diagnosis present

## 2017-06-07 DIAGNOSIS — R0902 Hypoxemia: Secondary | ICD-10-CM | POA: Diagnosis present

## 2017-06-07 DIAGNOSIS — K5900 Constipation, unspecified: Secondary | ICD-10-CM | POA: Diagnosis not present

## 2017-06-07 DIAGNOSIS — E669 Obesity, unspecified: Secondary | ICD-10-CM | POA: Diagnosis present

## 2017-06-07 DIAGNOSIS — Z85038 Personal history of other malignant neoplasm of large intestine: Secondary | ICD-10-CM

## 2017-06-07 DIAGNOSIS — Z87891 Personal history of nicotine dependence: Secondary | ICD-10-CM

## 2017-06-07 DIAGNOSIS — Z7951 Long term (current) use of inhaled steroids: Secondary | ICD-10-CM

## 2017-06-07 DIAGNOSIS — I10 Essential (primary) hypertension: Secondary | ICD-10-CM | POA: Diagnosis present

## 2017-06-07 DIAGNOSIS — E785 Hyperlipidemia, unspecified: Secondary | ICD-10-CM | POA: Diagnosis present

## 2017-06-07 DIAGNOSIS — Z923 Personal history of irradiation: Secondary | ICD-10-CM

## 2017-06-07 DIAGNOSIS — Z7982 Long term (current) use of aspirin: Secondary | ICD-10-CM

## 2017-06-07 DIAGNOSIS — K810 Acute cholecystitis: Secondary | ICD-10-CM | POA: Diagnosis present

## 2017-06-07 DIAGNOSIS — R0602 Shortness of breath: Secondary | ICD-10-CM

## 2017-06-07 DIAGNOSIS — Z8546 Personal history of malignant neoplasm of prostate: Secondary | ICD-10-CM

## 2017-06-07 DIAGNOSIS — Z880 Allergy status to penicillin: Secondary | ICD-10-CM

## 2017-06-07 DIAGNOSIS — Z419 Encounter for procedure for purposes other than remedying health state, unspecified: Secondary | ICD-10-CM

## 2017-06-07 DIAGNOSIS — K660 Peritoneal adhesions (postprocedural) (postinfection): Secondary | ICD-10-CM | POA: Diagnosis present

## 2017-06-07 DIAGNOSIS — K8000 Calculus of gallbladder with acute cholecystitis without obstruction: Principal | ICD-10-CM | POA: Diagnosis present

## 2017-06-07 DIAGNOSIS — M170 Bilateral primary osteoarthritis of knee: Secondary | ICD-10-CM | POA: Diagnosis present

## 2017-06-07 DIAGNOSIS — M1990 Unspecified osteoarthritis, unspecified site: Secondary | ICD-10-CM | POA: Diagnosis present

## 2017-06-07 DIAGNOSIS — Z6832 Body mass index (BMI) 32.0-32.9, adult: Secondary | ICD-10-CM

## 2017-06-07 DIAGNOSIS — K82A1 Gangrene of gallbladder in cholecystitis: Secondary | ICD-10-CM | POA: Diagnosis present

## 2017-06-07 HISTORY — DX: Malignant neoplasm of prostate: C61

## 2017-06-07 HISTORY — DX: Acute cholecystitis: K81.0

## 2017-06-07 NOTE — ED Triage Notes (Signed)
Pt comes in with complaints of constipation since Friday.  Pt states he feels his abdomen is distended and is pushing on his diaphragm.  Has taken suppository, Miralax, and Fleet enema without relief.  Pt was seen for same on 12/24. Pt has been able to tolerate some food.  Denies vomiting.

## 2017-06-08 ENCOUNTER — Encounter (HOSPITAL_COMMUNITY): Payer: Self-pay | Admitting: Emergency Medicine

## 2017-06-08 ENCOUNTER — Other Ambulatory Visit: Payer: Self-pay

## 2017-06-08 ENCOUNTER — Emergency Department (HOSPITAL_COMMUNITY): Payer: Medicare Other

## 2017-06-08 ENCOUNTER — Inpatient Hospital Stay: Admit: 2017-06-08 | Payer: Medicare Other | Admitting: Surgery

## 2017-06-08 ENCOUNTER — Inpatient Hospital Stay (HOSPITAL_COMMUNITY): Payer: Medicare Other | Admitting: Anesthesiology

## 2017-06-08 ENCOUNTER — Encounter (HOSPITAL_COMMUNITY): Admission: EM | Disposition: A | Discharge status: Home / Self Care | Payer: Self-pay | Source: Home / Self Care

## 2017-06-08 DIAGNOSIS — Z85038 Personal history of other malignant neoplasm of large intestine: Secondary | ICD-10-CM | POA: Diagnosis not present

## 2017-06-08 DIAGNOSIS — K660 Peritoneal adhesions (postprocedural) (postinfection): Secondary | ICD-10-CM | POA: Diagnosis present

## 2017-06-08 DIAGNOSIS — I10 Essential (primary) hypertension: Secondary | ICD-10-CM | POA: Diagnosis present

## 2017-06-08 DIAGNOSIS — Z7982 Long term (current) use of aspirin: Secondary | ICD-10-CM | POA: Diagnosis not present

## 2017-06-08 DIAGNOSIS — Z7951 Long term (current) use of inhaled steroids: Secondary | ICD-10-CM | POA: Diagnosis not present

## 2017-06-08 DIAGNOSIS — K219 Gastro-esophageal reflux disease without esophagitis: Secondary | ICD-10-CM | POA: Diagnosis present

## 2017-06-08 DIAGNOSIS — E669 Obesity, unspecified: Secondary | ICD-10-CM | POA: Diagnosis present

## 2017-06-08 DIAGNOSIS — Z6832 Body mass index (BMI) 32.0-32.9, adult: Secondary | ICD-10-CM | POA: Diagnosis not present

## 2017-06-08 DIAGNOSIS — K8000 Calculus of gallbladder with acute cholecystitis without obstruction: Secondary | ICD-10-CM | POA: Diagnosis present

## 2017-06-08 DIAGNOSIS — Z87891 Personal history of nicotine dependence: Secondary | ICD-10-CM | POA: Diagnosis not present

## 2017-06-08 DIAGNOSIS — M170 Bilateral primary osteoarthritis of knee: Secondary | ICD-10-CM | POA: Diagnosis present

## 2017-06-08 DIAGNOSIS — M1990 Unspecified osteoarthritis, unspecified site: Secondary | ICD-10-CM | POA: Diagnosis present

## 2017-06-08 DIAGNOSIS — K810 Acute cholecystitis: Secondary | ICD-10-CM | POA: Diagnosis not present

## 2017-06-08 DIAGNOSIS — Z880 Allergy status to penicillin: Secondary | ICD-10-CM | POA: Diagnosis not present

## 2017-06-08 DIAGNOSIS — R109 Unspecified abdominal pain: Secondary | ICD-10-CM | POA: Diagnosis present

## 2017-06-08 DIAGNOSIS — K5900 Constipation, unspecified: Secondary | ICD-10-CM | POA: Diagnosis not present

## 2017-06-08 DIAGNOSIS — K82A1 Gangrene of gallbladder in cholecystitis: Secondary | ICD-10-CM | POA: Diagnosis present

## 2017-06-08 DIAGNOSIS — Z923 Personal history of irradiation: Secondary | ICD-10-CM | POA: Diagnosis not present

## 2017-06-08 DIAGNOSIS — R0902 Hypoxemia: Secondary | ICD-10-CM | POA: Diagnosis present

## 2017-06-08 DIAGNOSIS — Z8546 Personal history of malignant neoplasm of prostate: Secondary | ICD-10-CM | POA: Diagnosis not present

## 2017-06-08 DIAGNOSIS — E785 Hyperlipidemia, unspecified: Secondary | ICD-10-CM | POA: Diagnosis present

## 2017-06-08 HISTORY — DX: Acute cholecystitis: K81.0

## 2017-06-08 HISTORY — PX: CHOLECYSTECTOMY: SHX55

## 2017-06-08 LAB — COMPREHENSIVE METABOLIC PANEL
ALT: 18 U/L (ref 17–63)
AST: 41 U/L (ref 15–41)
Albumin: 3.9 g/dL (ref 3.5–5.0)
Alkaline Phosphatase: 67 U/L (ref 38–126)
Anion gap: 16 — ABNORMAL HIGH (ref 5–15)
BUN: 16 mg/dL (ref 6–20)
CO2: 24 mmol/L (ref 22–32)
Calcium: 9.7 mg/dL (ref 8.9–10.3)
Chloride: 89 mmol/L — ABNORMAL LOW (ref 101–111)
Creatinine, Ser: 1.02 mg/dL (ref 0.61–1.24)
GFR calc Af Amer: >60 mL/min (ref >60)
GFR calc non Af Amer: >60 mL/min (ref >60)
Glucose, Bld: 193 mg/dL — ABNORMAL HIGH (ref 65–99)
Potassium: 4.3 mmol/L (ref 3.5–5.1)
Sodium: 129 mmol/L — ABNORMAL LOW (ref 135–145)
Total Bilirubin: 1.7 mg/dL — ABNORMAL HIGH (ref 0.3–1.2)
Total Protein: 7.7 g/dL (ref 6.5–8.1)

## 2017-06-08 LAB — CBC WITH DIFFERENTIAL/PLATELET
Basophils Absolute: 0 10*3/uL (ref 0.0–0.1)
Basophils Relative: 0 %
Eosinophils Absolute: 0 10*3/uL (ref 0.0–0.7)
Eosinophils Relative: 0 %
HCT: 47.4 % (ref 39.0–52.0)
Hemoglobin: 16.7 g/dL (ref 13.0–17.0)
Lymphocytes Relative: 6 %
Lymphs Abs: 1.2 10*3/uL (ref 0.7–4.0)
MCH: 34.9 pg — ABNORMAL HIGH (ref 26.0–34.0)
MCHC: 35.2 g/dL (ref 30.0–36.0)
MCV: 99 fL (ref 78.0–100.0)
Monocytes Absolute: 1.5 10*3/uL — ABNORMAL HIGH (ref 0.1–1.0)
Monocytes Relative: 8 %
Neutro Abs: 16.4 10*3/uL — ABNORMAL HIGH (ref 1.7–7.7)
Neutrophils Relative %: 86 %
Platelets: 241 10*3/uL (ref 150–400)
RBC: 4.79 MIL/uL (ref 4.22–5.81)
RDW: 13.2 % (ref 11.5–15.5)
WBC: 19.1 10*3/uL — ABNORMAL HIGH (ref 4.0–10.5)

## 2017-06-08 LAB — I-STAT CG4 LACTIC ACID, ED
Lactic Acid, Venous: 2.18 mmol/L (ref 0.5–1.9)
Lactic Acid, Venous: 4.05 mmol/L (ref 0.5–1.9)

## 2017-06-08 LAB — LIPASE, BLOOD: Lipase: 19 U/L (ref 11–51)

## 2017-06-08 SURGERY — LAPAROSCOPIC CHOLECYSTECTOMY WITH INTRAOPERATIVE CHOLANGIOGRAM
Anesthesia: General | Site: Abdomen

## 2017-06-08 MED ORDER — SUGAMMADEX SODIUM 200 MG/2ML IV SOLN
INTRAVENOUS | Status: DC | PRN
  Administered 2017-06-08: 200 mg via INTRAVENOUS

## 2017-06-08 MED ORDER — PROPOFOL 10 MG/ML IV BOLUS
INTRAVENOUS | Status: DC | PRN
  Administered 2017-06-08: 120 mg via INTRAVENOUS

## 2017-06-08 MED ORDER — PHENYLEPHRINE 40 MCG/ML (10ML) SYRINGE FOR IV PUSH (FOR BLOOD PRESSURE SUPPORT)
PREFILLED_SYRINGE | INTRAVENOUS | Status: AC
  Filled 2017-06-08: qty 10

## 2017-06-08 MED ORDER — PHENYLEPHRINE HCL 10 MG/ML IJ SOLN
INTRAMUSCULAR | Status: DC | PRN
  Administered 2017-06-08 (×4): 100 ug via INTRAVENOUS

## 2017-06-08 MED ORDER — HYDROCODONE-ACETAMINOPHEN 5-325 MG PO TABS
1.0000 | ORAL_TABLET | ORAL | Status: DC | PRN
  Administered 2017-06-09 (×2): 1 via ORAL
  Administered 2017-06-09 – 2017-06-11 (×4): 2 via ORAL
  Filled 2017-06-08 (×4): qty 2
  Filled 2017-06-08: qty 1
  Filled 2017-06-08 (×2): qty 2

## 2017-06-08 MED ORDER — FENTANYL CITRATE (PF) 100 MCG/2ML IJ SOLN
INTRAMUSCULAR | Status: AC
  Filled 2017-06-08: qty 2

## 2017-06-08 MED ORDER — EPHEDRINE 5 MG/ML INJ
INTRAVENOUS | Status: AC
  Filled 2017-06-08: qty 10

## 2017-06-08 MED ORDER — BUPIVACAINE-EPINEPHRINE 0.25% -1:200000 IJ SOLN
INTRAMUSCULAR | Status: AC
  Filled 2017-06-08: qty 1

## 2017-06-08 MED ORDER — ONDANSETRON HCL 4 MG/2ML IJ SOLN: 4.0000 mg | Freq: Four times a day (QID) | INTRAMUSCULAR | Status: DC | PRN

## 2017-06-08 MED ORDER — LIDOCAINE 2% (20 MG/ML) 5 ML SYRINGE
INTRAMUSCULAR | Status: AC
  Filled 2017-06-08: qty 5

## 2017-06-08 MED ORDER — HYDRALAZINE HCL 20 MG/ML IJ SOLN: 10.0000 mg | INTRAMUSCULAR | Status: DC | PRN

## 2017-06-08 MED ORDER — CIPROFLOXACIN IN D5W 400 MG/200ML IV SOLN
400.0000 mg | Freq: Once | INTRAVENOUS | Status: AC
  Administered 2017-06-08: 400 mg via INTRAVENOUS
  Filled 2017-06-08: qty 200

## 2017-06-08 MED ORDER — DEXTROSE-NACL 5-0.9 % IV SOLN
INTRAVENOUS | Status: DC
  Administered 2017-06-08: 09:00:00 via INTRAVENOUS
  Administered 2017-06-08: 1000 mL via INTRAVENOUS
  Administered 2017-06-09: 10:00:00 via INTRAVENOUS

## 2017-06-08 MED ORDER — HYDROMORPHONE HCL 1 MG/ML IJ SOLN
1.0000 mg | INTRAMUSCULAR | Status: DC | PRN
  Administered 2017-06-08: 1 mg via INTRAVENOUS
  Filled 2017-06-08: qty 1

## 2017-06-08 MED ORDER — EPHEDRINE SULFATE 50 MG/ML IJ SOLN
INTRAMUSCULAR | Status: DC | PRN
  Administered 2017-06-08: 10 mg via INTRAVENOUS
  Administered 2017-06-08 (×2): 5 mg via INTRAVENOUS

## 2017-06-08 MED ORDER — METOCLOPRAMIDE HCL 5 MG/ML IJ SOLN: 10.0000 mg | Freq: Once | INTRAMUSCULAR | Status: DC | PRN

## 2017-06-08 MED ORDER — ROCURONIUM BROMIDE 50 MG/5ML IV SOSY
PREFILLED_SYRINGE | INTRAVENOUS | Status: AC
  Filled 2017-06-08: qty 5

## 2017-06-08 MED ORDER — SODIUM CHLORIDE 0.9 % IV BOLUS (SEPSIS)
1000.0000 mL | Freq: Once | INTRAVENOUS | Status: AC
  Administered 2017-06-08: 1000 mL via INTRAVENOUS

## 2017-06-08 MED ORDER — MEPERIDINE HCL 50 MG/ML IJ SOLN: 6.2500 mg | INTRAMUSCULAR | Status: DC | PRN

## 2017-06-08 MED ORDER — ONDANSETRON HCL 4 MG/2ML IJ SOLN
INTRAMUSCULAR | Status: DC | PRN
  Administered 2017-06-08: 4 mg via INTRAVENOUS

## 2017-06-08 MED ORDER — ONDANSETRON HCL 4 MG/2ML IJ SOLN
4.0000 mg | Freq: Once | INTRAMUSCULAR | Status: AC
  Administered 2017-06-08: 4 mg via INTRAVENOUS
  Filled 2017-06-08: qty 2

## 2017-06-08 MED ORDER — MORPHINE SULFATE (PF) 4 MG/ML IV SOLN
4.0000 mg | Freq: Once | INTRAVENOUS | Status: AC
  Administered 2017-06-08: 4 mg via INTRAVENOUS
  Filled 2017-06-08: qty 1

## 2017-06-08 MED ORDER — SUGAMMADEX SODIUM 200 MG/2ML IV SOLN
INTRAVENOUS | Status: AC
  Filled 2017-06-08: qty 2

## 2017-06-08 MED ORDER — IOPAMIDOL (ISOVUE-300) INJECTION 61%
INTRAVENOUS | Status: AC
  Filled 2017-06-08: qty 50

## 2017-06-08 MED ORDER — IOPAMIDOL (ISOVUE-300) INJECTION 61%
100.0000 mL | Freq: Once | INTRAVENOUS | Status: AC | PRN
  Administered 2017-06-08: 100 mL via INTRAVENOUS

## 2017-06-08 MED ORDER — METRONIDAZOLE IN NACL 5-0.79 MG/ML-% IV SOLN
500.0000 mg | Freq: Once | INTRAVENOUS | Status: AC
  Administered 2017-06-08: 500 mg via INTRAVENOUS
  Filled 2017-06-08: qty 100

## 2017-06-08 MED ORDER — FENTANYL CITRATE (PF) 100 MCG/2ML IJ SOLN: 25.0000 ug | INTRAMUSCULAR | Status: DC | PRN

## 2017-06-08 MED ORDER — BUPIVACAINE-EPINEPHRINE 0.25% -1:200000 IJ SOLN
INTRAMUSCULAR | Status: DC | PRN
  Administered 2017-06-08: 30 mL

## 2017-06-08 MED ORDER — LACTATED RINGERS IR SOLN
Status: DC | PRN
  Administered 2017-06-08: 2000 mL

## 2017-06-08 MED ORDER — LIDOCAINE HCL (CARDIAC) 20 MG/ML IV SOLN
INTRAVENOUS | Status: DC | PRN
  Administered 2017-06-08: 80 mg via INTRAVENOUS

## 2017-06-08 MED ORDER — ONDANSETRON HCL 4 MG/2ML IJ SOLN
INTRAMUSCULAR | Status: AC
  Filled 2017-06-08: qty 2

## 2017-06-08 MED ORDER — 0.9 % SODIUM CHLORIDE (POUR BTL) OPTIME
TOPICAL | Status: DC | PRN
  Administered 2017-06-08: 1000 mL

## 2017-06-08 MED ORDER — FENTANYL CITRATE (PF) 100 MCG/2ML IJ SOLN
INTRAMUSCULAR | Status: DC | PRN
  Administered 2017-06-08 (×2): 50 ug via INTRAVENOUS
  Administered 2017-06-08 (×2): 25 ug via INTRAVENOUS
  Administered 2017-06-08: 50 ug via INTRAVENOUS

## 2017-06-08 MED ORDER — IOPAMIDOL (ISOVUE-300) INJECTION 61%
INTRAVENOUS | Status: AC
  Filled 2017-06-08: qty 100

## 2017-06-08 MED ORDER — SUCCINYLCHOLINE CHLORIDE 200 MG/10ML IV SOSY
PREFILLED_SYRINGE | INTRAVENOUS | Status: AC
  Filled 2017-06-08: qty 10

## 2017-06-08 MED ORDER — ROCURONIUM BROMIDE 100 MG/10ML IV SOLN
INTRAVENOUS | Status: DC | PRN
  Administered 2017-06-08: 10 mg via INTRAVENOUS
  Administered 2017-06-08: 50 mg via INTRAVENOUS
  Administered 2017-06-08 (×2): 10 mg via INTRAVENOUS
  Administered 2017-06-08 (×2): 20 mg via INTRAVENOUS

## 2017-06-08 MED ORDER — METRONIDAZOLE IN NACL 5-0.79 MG/ML-% IV SOLN
500.0000 mg | Freq: Three times a day (TID) | INTRAVENOUS | Status: DC
  Administered 2017-06-08 – 2017-06-16 (×24): 500 mg via INTRAVENOUS
  Filled 2017-06-08 (×25): qty 100

## 2017-06-08 MED ORDER — CIPROFLOXACIN IN D5W 400 MG/200ML IV SOLN
INTRAVENOUS | Status: AC
  Filled 2017-06-08: qty 200

## 2017-06-08 MED ORDER — CIPROFLOXACIN IN D5W 400 MG/200ML IV SOLN
INTRAVENOUS | Status: DC | PRN
  Administered 2017-06-08: 400 mg via INTRAVENOUS

## 2017-06-08 MED ORDER — MORPHINE SULFATE (PF) 2 MG/ML IV SOLN
1.0000 mg | INTRAVENOUS | Status: DC | PRN
  Administered 2017-06-09: 2 mg via INTRAVENOUS
  Filled 2017-06-08: qty 1

## 2017-06-08 MED ORDER — LACTATED RINGERS IV SOLN
INTRAVENOUS | Status: DC
  Administered 2017-06-08 (×2): via INTRAVENOUS

## 2017-06-08 MED ORDER — CIPROFLOXACIN IN D5W 400 MG/200ML IV SOLN
400.0000 mg | Freq: Two times a day (BID) | INTRAVENOUS | Status: DC
  Administered 2017-06-09 – 2017-06-13 (×9): 400 mg via INTRAVENOUS
  Filled 2017-06-08 (×9): qty 200

## 2017-06-08 MED ORDER — ONDANSETRON 4 MG PO TBDP: 4.0000 mg | ORAL_TABLET | Freq: Four times a day (QID) | ORAL | Status: DC | PRN

## 2017-06-08 SURGICAL SUPPLY — 46 items
APPLIER CLIP 5 13 M/L LIGAMAX5 (MISCELLANEOUS)
APPLIER CLIP ROT 10 11.4 M/L (STAPLE) ×2
APPLIER CLIP ROT 13.4 12 LRG (CLIP) ×2
BENZOIN TINCTURE PRP APPL 2/3 (GAUZE/BANDAGES/DRESSINGS) IMPLANT
CABLE HIGH FREQUENCY MONO STRZ (ELECTRODE) ×2 IMPLANT
CHLORAPREP W/TINT 26ML (MISCELLANEOUS) ×2 IMPLANT
CHOLANGIOGRAM CATH TAUT (CATHETERS) ×2 IMPLANT
CLIP APPLIE 5 13 M/L LIGAMAX5 (MISCELLANEOUS) IMPLANT
CLIP APPLIE ROT 10 11.4 M/L (STAPLE) ×1 IMPLANT
CLIP APPLIE ROT 13.4 12 LRG (CLIP) ×1 IMPLANT
COVER MAYO STAND STRL (DRAPES) ×2 IMPLANT
COVER SURGICAL LIGHT HANDLE (MISCELLANEOUS) ×2 IMPLANT
DECANTER SPIKE VIAL GLASS SM (MISCELLANEOUS) ×2 IMPLANT
DERMABOND ADVANCED (GAUZE/BANDAGES/DRESSINGS) ×1
DERMABOND ADVANCED .7 DNX12 (GAUZE/BANDAGES/DRESSINGS) ×1 IMPLANT
DRAIN CHANNEL 19F RND (DRAIN) ×2 IMPLANT
DRAPE C-ARM 42X120 X-RAY (DRAPES) ×2 IMPLANT
ELECT REM PT RETURN 15FT ADLT (MISCELLANEOUS) ×2 IMPLANT
EVACUATOR SILICONE 100CC (DRAIN) ×2 IMPLANT
GAUZE SPONGE 2X2 8PLY STRL LF (GAUZE/BANDAGES/DRESSINGS) ×1 IMPLANT
GLOVE SURG SIGNA 7.5 PF LTX (GLOVE) ×2 IMPLANT
GOWN STRL REUS W/TWL XL LVL3 (GOWN DISPOSABLE) ×2 IMPLANT
HEMOSTAT SURGICEL 2X14 (HEMOSTASIS) ×2 IMPLANT
HEMOSTAT SURGICEL 4X8 (HEMOSTASIS) IMPLANT
IV CATH 14GX2 1/4 (CATHETERS) ×2 IMPLANT
IV SET EXTENSION CATH 6 NF (IV SETS) ×2 IMPLANT
KIT BASIN OR (CUSTOM PROCEDURE TRAY) ×2 IMPLANT
POUCH RETRIEVAL ECOSAC 10 (ENDOMECHANICALS) ×1 IMPLANT
POUCH RETRIEVAL ECOSAC 10MM (ENDOMECHANICALS) ×1
SCISSORS LAP 5X35 DISP (ENDOMECHANICALS) ×2 IMPLANT
SHEARS HARMONIC ACE PLUS 36CM (ENDOMECHANICALS) ×2 IMPLANT
SLEEVE ADV FIXATION 5X100MM (TROCAR) ×8 IMPLANT
SPONGE GAUZE 2X2 STER 10/PKG (GAUZE/BANDAGES/DRESSINGS) ×1
STOPCOCK 4 WAY LG BORE MALE ST (IV SETS) ×2 IMPLANT
STRIP CLOSURE SKIN 1/4X4 (GAUZE/BANDAGES/DRESSINGS) IMPLANT
SUT ETHILON 2 0 PS N (SUTURE) ×2 IMPLANT
SUT MNCRL AB 4-0 PS2 18 (SUTURE) ×2 IMPLANT
SYR 10ML ECCENTRIC (SYRINGE) ×2 IMPLANT
TOWEL OR 17X26 10 PK STRL BLUE (TOWEL DISPOSABLE) ×2 IMPLANT
TOWEL OR NON WOVEN STRL DISP B (DISPOSABLE) ×2 IMPLANT
TRAY LAPAROSCOPIC (CUSTOM PROCEDURE TRAY) ×2 IMPLANT
TROCAR ADV FIXATION 11X100MM (TROCAR) ×2 IMPLANT
TROCAR ADV FIXATION 5X100MM (TROCAR) ×2 IMPLANT
TROCAR BLADELESS OPT 5 100 (ENDOMECHANICALS) ×2 IMPLANT
TROCAR XCEL BLUNT TIP 100MML (ENDOMECHANICALS) IMPLANT
TUBING INSUF HEATED (TUBING) ×2 IMPLANT

## 2017-06-08 NOTE — Op Note (Signed)
06/08/2017  7:06 PM  PATIENT:  Julian Brown, 83 y.o., male, MRN: [id number]  PREOP DIAGNOSIS:  Cholelithiasis, acute cholecystitis  POSTOP DIAGNOSIS:   Gangrenous cholecystitis, intra-abdominal adhesions, abdominal wall hernias  PROCEDURE:   Procedure(s): LAPAROSCOPIC CHOLECYSTECTOMY WITH LYSIS OF ADHESIONS (90 minutes enterolysis)  SURGEON:   [name], M.D.  ASSISTANT:   Clent Demark  ANESTHESIA:   general  Anesthesiologist: Montez Hageman, MD CRNA: Raenette Rover, CRNA; Claudia Desanctis, CRNA; Glory Buff, CRNA  General  ASA: 3  EBL:  75  ml  BLOOD ADMINISTERED: none  DRAINS: 19 F Blake drain  LOCAL MEDICATIONS USED:   30 cc 1/4% marcaine  SPECIMEN:   Gall bladder  COUNTS CORRECT:  YES  INDICATIONS FOR PROCEDURE:  Julian Brown is a 83 y.o. (DOB: 1932/10/11) white male whose primary care physician is Avva, Ravisankar, MD and comes for cholecystectomy.   The indications and risks of the gall bladder surgery were explained to the patient.  The risks include, but are not limited to, infection, bleeding, common bile duct injury and open surgery.  SURGERY:  The patient was taken to OR room #1 at [facility].  The abdomen was prepped with chloroprep.  The patient was given Cipro and Flagyl prior to the beginning of the operation.   A time out was held and the surgical checklist run.   He had had a prior right hemicolectomy and I accessed his abdominal cavity with a 5 mm Optiview in the left upper quadrant.  I placed 6 additional trochars.  One 5 mm trocar in the left lower mid abdomen, one 5 mm trocar in the right lower midabdomen, one 5 mm trocar on the right upper quadrant, one 5 mm trocar on the right upper lateral quadrant, one 5 mm trocar midway between the xiphoid and umbilicus, and a 11 mm trocar in the subxiphoid location.   The patient had extensive adhesions from his prior colon surgery.  I spent about 90 minutes taking down  omentum and fat from the anterior abdominal wall and small bowel from the right abdominal wall.  He had multiple small fascial hernias in the midline.  In the right upper quadrant the small bowel was draped and stuck to the gallbladder.  The gallbladder was gangrenous and necrotic.  I dissected the small bowel and mesentery down below the gallbladder and was able to see the anatomy fairly well.   Disssection was carried down to the gall bladder/cystic duct junction and the cystic duct isolated.  Identified cystic duct was about to the gallbladder.  This was somewhat necrotic.  I did identify the cystic artery.  There is no way that I thought I could safely do a cholangiogram without compromising the cystic duct that I found.  Therefore I placed 3 clips across the cystic duct and 2 clips across the cystic artery and divided the gallbladder from this.   I dissected the gallbladder from the gallbladder bed.  The gallbladder wall was infarcted and necrotic.  After the gall bladder was removed from the liver, the gall bladder bed and Tri[place] of [place] were inspected.  There was no bleeding or bile leak.    I placed Surgicel in the gallbladder bed.    The gall bladder was placed in a endocatch bag and delivered through the the subxiphoid incision.  The abdomen was irrigated with 2,000 cc saline.   I placed a 92 [place] Blake drain in the gallbladder bed fossa and sewed  this in place with a 2-0 nylon sutures.   The trocars were then removed.  I infiltrated 30 cc of 1/4% Marcaine into the incisions.  The umbilical port closed with a 0 Vicryl suture and the skin closed with 4-0 Monocryl.  The skin was painted with DermaBond.  The patient's sponge and needle count were correct.  The patient was transported to the RR in good condition.  [name], MD, [facility] Pager: [phone] Office phone:  [phone]

## 2017-06-08 NOTE — Anesthesia Procedure Notes (Addendum)
Procedure Name: Intubation Date/Time: 06/08/2017 4:04 PM Performed by: Raenette Rover, CRNA Pre-anesthesia Checklist: Patient identified, Emergency Drugs available, Suction available and Patient being monitored Patient Re-evaluated:Patient Re-evaluated prior to induction Oxygen Delivery Method: Circle system utilized Preoxygenation: Pre-oxygenation with 100% oxygen Induction Type: IV induction Ventilation: Mask ventilation without difficulty and Oral airway inserted - appropriate to patient size Laryngoscope Size: Mac and 4 Grade View: Grade I Tube type: Oral Tube size: 7.5 mm Number of attempts: 1 Airway Equipment and Method: Stylet Placement Confirmation: ETT inserted through vocal cords under direct vision,  positive ETCO2,  CO2 detector and breath sounds checked- equal and bilateral Secured at: 21 cm Tube secured with: Tape Dental Injury: Teeth and Oropharynx as per pre-operative assessment

## 2017-06-08 NOTE — ED Provider Notes (Signed)
[facility] DEPT Provider Note   CSN: [id number] Arrival date & time: 06/07/17  1827     History   Chief Complaint Chief Complaint  Patient presents with  . Constipation    HPI Julian Brown is a 83 y.o. male.  Patient is a an 83 year old male with past medical history of hypertension, hyperlipidemia, colon cancer with bowel resection in 2007.  He presents today for evaluation of abdominal distention, pain that is worsening over the past several days.  He was seen here 2 nights ago with similar complaints.  He underwent a CT scan and ultrasound, both which were unremarkable.  He was discharged, now returns with worsening pain and distention.  He denies any fevers or chills.  He denies any bloody vomit or stool.  He does report to very small bowel movements, but denies having passed gas today.  He feels constipated and is if he needs to have a bowel movement, however MiraLAX and fiber has not helped.  He is also use the Dulcolax with no results.   The history is provided by the patient.  Abdominal Pain   This is a new problem. Episode onset: 3 days ago. The problem occurs constantly. The problem has been gradually worsening. The pain is associated with eating. The pain is located in the generalized abdominal region. The quality of the pain is cramping. The pain is severe. Associated symptoms include nausea, vomiting and constipation. Pertinent negatives include fever. The symptoms are aggravated by eating, palpation and certain positions. Nothing relieves the symptoms. Past workup includes CT scan and ultrasound.    Past Medical History:  Diagnosis Date  . Arthritis   . Cancer [facility])    prostate cancer-tx radiation  . ED (erectile dysfunction)   . Hyperlipemia   . Hypertension   . Wears glasses     Patient Active Problem List   Diagnosis Date Noted  . Pain in right wrist 01/04/2017  . Chronic pain of left knee 01/04/2017  . Chronic pain of  right knee 01/04/2017  . Unilateral primary osteoarthritis, left knee 01/04/2017  . Unilateral primary osteoarthritis, right knee 01/04/2017    Past Surgical History:  Procedure Laterality Date  . APPENDECTOMY    . CARPAL TUNNEL RELEASE  5/12   lt  . CARPAL TUNNEL RELEASE  12/13/2011   Procedure: CARPAL TUNNEL RELEASE;  Surgeon: [name]., MD;  Location: [place];  Service: Orthopedics;  Laterality: Right;  and inject right wrist  . COLON SURGERY  1997   hemicolectomy-rt-ca  . COLONOSCOPY         Home Medications    Prior to Admission medications   Medication Sig Start Date End Date Taking? Authorizing Provider  acetaminophen (TYLENOL) 500 MG tablet Take 1,000 mg by mouth every 6 (six) hours as needed for mild pain.    [provider]  amLODipine (NORVASC) 10 MG tablet Take 10 mg by mouth daily.    [provider]  aspirin EC 81 MG tablet Take 81 mg by mouth daily.    [provider]  cholecalciferol (VITAMIN D) 1000 UNITS tablet Take 2,000 Units by mouth daily.     [provider]  cloNIDine (CATAPRES) 0.1 MG tablet Take 1 tablet (0.1 mg total) by mouth 3 (three) times daily as needed (BP greater than 539 systolic, or 767 diastolic). Patient not taking: Reported on 34/19/3790 2/40/97   Delora Fuel, MD  fish oil-omega-3 fatty acids 1000 MG capsule  Take 1 g by mouth daily.     [provider]  fluticasone (FLONASE) 50 MCG/ACT nasal spray Place 1 spray into both nostrils daily.  05/10/17   [provider]  fluticasone furoate-vilanterol (BREO ELLIPTA) 100-25 MCG/INH AEPB Inhale 1 puff into the lungs daily.    [provider]  glycerin adult 2 g suppository Place 1 suppository rectally as needed for constipation. 06/05/17   McDonald, Mia A, PA-C  magnesium hydroxide (MILK OF MAGNESIA) 400 MG/5ML suspension Take 5 mLs by mouth daily as needed for mild constipation.    [provider]    metoprolol tartrate (LOPRESSOR) 25 MG tablet Take 25 mg by mouth 2 (two) times daily.    [provider]  olmesartan-hydrochlorothiazide (BENICAR HCT) 40-12.5 MG tablet Take 1 tablet by mouth daily.    [provider]  rosuvastatin (CRESTOR) 10 MG tablet Take 10 mg by mouth daily.    [provider]    Family History No family history on file.  Social History Social History   Tobacco Use  . Smoking status: Former Smoker    Last attempt to quit: 12/09/1986    Years since quitting: 30.5  . Smokeless tobacco: Never Used  Substance Use Topics  . Alcohol use: Yes    Comment: occ  . Drug use: No     Allergies   Penicillins   Review of Systems Review of Systems  Constitutional: Negative for fever.  Gastrointestinal: Positive for abdominal pain, constipation, nausea and vomiting.  All other systems reviewed and are negative.    Physical Exam Updated Vital Signs BP (!) 170/61 (BP Location: Left Arm)   Pulse 92   Temp 98.1 F (36.7 C) (Oral)   Resp (!) 21   SpO2 (!) 89%   Physical Exam  Constitutional: He is oriented to person, place, and time. He appears well-developed and well-nourished. No distress.  HENT:  Head: Normocephalic and atraumatic.  Mouth/Throat: Oropharynx is clear and moist.  Neck: Normal range of motion. Neck supple.  Cardiovascular: Normal rate and regular rhythm. Exam reveals no friction rub.  No murmur heard. Pulmonary/Chest: Effort normal and breath sounds normal. No respiratory distress. He has no wheezes. He has no rales.  Abdominal: Soft. Bowel sounds are normal. He exhibits distension. There is no tenderness. There is no rebound and no guarding.  There is distention and tenderness to all 4 quadrants.  Musculoskeletal: Normal range of motion. He exhibits no edema.  Neurological: He is alert and oriented to person, place, and time. Coordination normal.  Skin: Skin is warm and dry. He is not diaphoretic.  Nursing note  and vitals reviewed.    ED Treatments / Results  Labs (all labs ordered are listed, but only abnormal results are displayed) Labs Reviewed  COMPREHENSIVE METABOLIC PANEL  CBC WITH DIFFERENTIAL/PLATELET  LIPASE, BLOOD  I-STAT CG4 LACTIC ACID, ED    EKG  EKG Interpretation None       Radiology No results found.  Procedures Procedures (including critical care time)  Medications Ordered in ED Medications  sodium chloride 0.9 % bolus 1,000 mL (not administered)  morphine 4 MG/ML injection 4 mg (not administered)  ondansetron (ZOFRAN) injection 4 mg (not administered)     Initial Impression / Assessment and Plan / ED Course  I have reviewed the triage vital signs and the nursing notes.  Pertinent labs & imaging results that were available during my care of the patient were reviewed by me and considered in my medical  decision making (see chart for details).  Patient returns with complaints of worsening epigastric pain and bloating.  He was seen here 2 days ago with similar complaints, however no definite cause was found.  Patient's presentation was concerning for possible small bowel obstruction.  Acute abdominal series was obtained, however was unremarkable.  A CT scan was performed as he now has a white count of 19,000.  This showed the interval development of acute cholecystitis.  This was discussed with Dr. Rosendo Gros from general surgery who will evaluate the patient in the ER.  Final Clinical Impressions(s) / ED Diagnoses   Final diagnoses:  None    ED Discharge Orders    None       Veryl Speak, MD 06/08/17 2301

## 2017-06-08 NOTE — Anesthesia Preprocedure Evaluation (Signed)
Anesthesia Evaluation  Patient identified by MRN, date of birth, ID band Patient awake    Reviewed: Allergy & Precautions, NPO status , Patient's Chart, lab work & pertinent test results  Airway Mallampati: II  TM Distance: >3 FB Neck ROM: Full    Dental no notable dental hx.    Pulmonary former smoker,    Pulmonary exam normal breath sounds clear to auscultation       Cardiovascular hypertension, Pt. on medications Normal cardiovascular exam Rhythm:Regular Rate:Normal     Neuro/Psych negative neurological ROS  negative psych ROS   GI/Hepatic negative GI ROS, Neg liver ROS,   Endo/Other  negative endocrine ROS  Renal/GU negative Renal ROS  negative genitourinary   Musculoskeletal negative musculoskeletal ROS (+)   Abdominal   Peds negative pediatric ROS (+)  Hematology negative hematology ROS (+)   Anesthesia Other Findings   Reproductive/Obstetrics negative OB ROS                             Anesthesia Physical Anesthesia Plan  ASA: II  Anesthesia Plan: General   Post-op Pain Management:    Induction: Intravenous  PONV Risk Score and Plan: 3 and Ondansetron and Treatment may vary due to age or medical condition  Airway Management Planned: Oral ETT  Additional Equipment:   Intra-op Plan:   Post-operative Plan: Extubation in OR  Informed Consent: I have reviewed the patients History and Physical, chart, labs and discussed the procedure including the risks, benefits and alternatives for the proposed anesthesia with the patient or authorized representative who has indicated his/her understanding and acceptance.   Dental advisory given  Plan Discussed with: CRNA  Anesthesia Plan Comments:         Anesthesia Quick Evaluation

## 2017-06-08 NOTE — ED Notes (Signed)
I gave critical I Stat CG4 result to MD Delo 

## 2017-06-08 NOTE — Progress Notes (Signed)
Seen earlier by Dr. Rosendo Gros. Agree with assessment that he clinically and radiologically has acute cholecystitis.  I discussed with the patient the indications and risks of gall bladder surgery.  The primary risks of gall bladder surgery include, but are not limited to, bleeding, infection, common bile duct injury, and open surgery.  There is also the risk that the patient may have continued symptoms after surgery.  We discussed the typical post-operative recovery course. I tried to answer the patient's questions.  The risks of open surgery include obesity and prior abdominal surgery.  The patient is uncertain what he had before - but what I found in Epic is that he had a right hemicolectomy.  His PCP is Dr. Dagmar Brown.  He sees Dr. Rosana Brown for prostate cancer. His daughter, Julian Brown, is at the bed side. His wife, Julian Brown, and daughter, Julian Brown, are on the way.  Julian Overall, MD, [facility] Pager: [phone] Office phone:  [phone]

## 2017-06-08 NOTE — H&P (Signed)
Julian Brown is an 83 y.o. male.   Chief Complaint: Abdominal pain HPI: Patient is a 83 year old male with a past medical history start for hyperlipidemia, hypertension, previous colon cancer and prostate cancer.  Patient comes in with a 4-day history of abdominal pain.  Patient was recently seen in the ER secondary to abdominal pain.  He underwent ultrasound that time which was normal.  Patient was discharged with constipation.  Patient continued with abdominal pain, some nausea as well as some minimal emesis.  Patient can continued with p.o. diet with exacerbation of abdominal pain.  Patient states he developed some distention.  He does state that he has been constipated for approximately 6 days.  Past Medical History:  Diagnosis Date  . Arthritis   . Cancer [facility])    prostate cancer-tx radiation  . ED (erectile dysfunction)   . Hyperlipemia   . Hypertension   . Wears glasses     Past Surgical History:  Procedure Laterality Date  . APPENDECTOMY    . CARPAL TUNNEL RELEASE  5/12   lt  . CARPAL TUNNEL RELEASE  12/13/2011   Procedure: CARPAL TUNNEL RELEASE;  Surgeon: [name]., MD;  Location: [facility];  Service: Orthopedics;  Laterality: Right;  and inject right wrist  . COLON SURGERY  1997   hemicolectomy-rt-ca  . COLONOSCOPY      No family history on file. Social History:  reports that he quit smoking about 30 years ago. he has never used smokeless tobacco. He reports that he drinks alcohol. He reports that he does not use drugs.  Allergies:  Allergies  Allergen Reactions  . Penicillins Swelling    Has patient had a PCN reaction causing immediate rash, facial/tongue/throat swelling, SOB or lightheadedness with hypotension: no (lip swelling "as soon as the pill touched my mouth so I didn't swallow it") Has patient had a PCN reaction causing severe rash involving mucus membranes or skin necrosis: no Has patient had a PCN reaction that required  hospitalization: no Has patient had a PCN reaction occurring within the last 10 years: no If all of the above answers are "NO", then may proceed with Ceph     (Not in a hospital admission)  Results for orders placed or performed during the hospital encounter of 06/07/17 (from the past 48 hour(s))  Comprehensive metabolic panel     Status: Abnormal   Collection Time: 06/08/17 12:12 AM  Result Value Ref Range   Sodium 129 (L) 135 - 145 mmol/L   Potassium 4.3 3.5 - 5.1 mmol/L   Chloride 89 (L) 101 - 111 mmol/L   CO2 24 22 - 32 mmol/L   Glucose, Bld 193 (H) 65 - 99 mg/dL   BUN 16 6 - 20 mg/dL   Creatinine, Ser 1.02 0.61 - 1.24 mg/dL   Calcium 9.7 8.9 - 10.3 mg/dL   Total Protein 7.7 6.5 - 8.1 g/dL   Albumin 3.9 3.5 - 5.0 g/dL   AST 41 15 - 41 U/L   ALT 18 17 - 63 U/L   Alkaline Phosphatase 67 38 - 126 U/L   Total Bilirubin 1.7 (H) 0.3 - 1.2 mg/dL   GFR calc non Af Amer >60 >60 mL/min   GFR calc Af Amer >60 >60 mL/min    Comment: (NOTE) The eGFR has been calculated using the CKD EPI equation. This calculation has not been validated in all clinical situations. eGFR's persistently <60 mL/min signify possible Chronic Kidney Disease.  Anion gap 16 (H) 5 - 15  Lipase, blood     Status: None   Collection Time: 06/08/17 12:12 AM  Result Value Ref Range   Lipase 19 11 - 51 U/L  CBC with Differential/Platelet     Status: Abnormal   Collection Time: 06/08/17 12:57 AM  Result Value Ref Range   WBC 19.1 (H) 4.0 - 10.5 K/uL   RBC 4.79 4.22 - 5.81 MIL/uL   Hemoglobin 16.7 13.0 - 17.0 g/dL   HCT 47.4 39.0 - 52.0 %   MCV 99.0 78.0 - 100.0 fL   MCH 34.9 (H) 26.0 - 34.0 pg   MCHC 35.2 30.0 - 36.0 g/dL   RDW 13.2 11.5 - 15.5 %   Platelets 241 150 - 400 K/uL   Neutrophils Relative % 86 %   Neutro Abs 16.4 (H) 1.7 - 7.7 K/uL   Lymphocytes Relative 6 %   Lymphs Abs 1.2 0.7 - 4.0 K/uL   Monocytes Relative 8 %   Monocytes Absolute 1.5 (H) 0.1 - 1.0 K/uL   Eosinophils Relative 0 %    Eosinophils Absolute 0.0 0.0 - 0.7 K/uL   Basophils Relative 0 %   Basophils Absolute 0.0 0.0 - 0.1 K/uL  I-Stat CG4 Lactic Acid, ED     Status: Abnormal   Collection Time: 06/08/17 12:59 AM  Result Value Ref Range   Lactic Acid, Venous 4.05 (HH) 0.5 - 1.9 mmol/L   Comment NOTIFIED PHYSICIAN   I-Stat CG4 Lactic Acid, ED     Status: Abnormal   Collection Time: 06/08/17  4:07 AM  Result Value Ref Range   Lactic Acid, Venous 2.18 (HH) 0.5 - 1.9 mmol/L   Comment NOTIFIED PHYSICIAN    Ct Abdomen Pelvis W Contrast  Result Date: 06/08/2017 CLINICAL DATA:  83 y/o M; 1 week of constipation and abdominal distention. History of right colon cancer hemicolectomy as well as prostate cancer post radiation and chemotherapy. EXAM: CT ABDOMEN AND PELVIS WITH CONTRAST TECHNIQUE: Multidetector CT imaging of the abdomen and pelvis was performed using the standard protocol following bolus administration of intravenous contrast. CONTRAST:  146m ISOVUE-300 IOPAMIDOL (ISOVUE-300) INJECTION 61% COMPARISON:  06/05/2017 CT abdomen and pelvis FINDINGS: Lower chest: Mild coronary artery calcification. Hepatobiliary: Numerous stable hepatic cyst. Interval development of gallbladder distention, pericholecystic fluid, and gallbladder wall thickening is likely representing acute cholecystitis. Cholelithiasis. No biliary ductal dilatation. Pancreas: Unremarkable. No pancreatic ductal dilatation or surrounding inflammatory changes. Spleen: Normal in size without focal abnormality. Adrenals/Urinary Tract: Adrenal glands are unremarkable. Kidneys are normal, without renal calculi, focal lesion, or hydronephrosis. Bladder is unremarkable. Stomach/Bowel: Right hemicolectomy. Patent enteric colonic anastomosis. No obstructive or inflammatory changes of the bowel. Vascular/Lymphatic: Aortic atherosclerosis. No enlarged abdominal or pelvic lymph nodes. Reproductive: Prostate seeds noted. Other: Small volume of ascites. Musculoskeletal: No  fracture is seen. Mild right hip osteoarthrosis and multilevel lumbar spondylosis. IMPRESSION: 1. Interval development of gallbladder wall thickening and pericholecystic fluid compatible with acute cholecystitis. Cholelithiasis. No biliary ductal dilatation. 2. Multiple hepatic cysts. 3. Aortic atherosclerosis. Electronically Signed   By: LKristine GarbeM.D.   On: 06/08/2017 04:31   Dg Abd Acute W/chest  Result Date: 06/08/2017 CLINICAL DATA:  Acute onset of mid abdominal pain and distention. Nausea. Constipation. EXAM: DG ABDOMEN ACUTE W/ 1V CHEST COMPARISON:  None. FINDINGS: The lungs are well-aerated. Mild bibasilar airspace opacities may reflect pneumonia. There is no evidence of pleural effusion or pneumothorax. The cardiomediastinal silhouette is within normal limits. The visualized bowel gas pattern  is unremarkable. Scattered stool and air are seen within the colon; there is no evidence of small bowel dilatation to suggest obstruction. No free intra-abdominal air is identified on the provided decubitus view. No acute osseous abnormalities are seen; the sacroiliac joints are unremarkable in appearance. IMPRESSION: 1. Unremarkable bowel gas pattern; no free intra-abdominal air seen. Small to moderate amount of stool noted in the colon. 2. Mild bibasilar airspace opacities may reflect pneumonia. Electronically Signed   By: Garald Balding M.D.   On: 06/08/2017 01:11    Review of Systems  Constitutional: Negative for chills, fever and malaise/fatigue.  HENT: Negative for ear discharge, hearing loss and sore throat.   Eyes: Negative for blurred vision and discharge.  Respiratory: Negative for cough and shortness of breath.   Cardiovascular: Negative for chest pain, orthopnea and leg swelling.  Gastrointestinal: Positive for abdominal pain, constipation, nausea and vomiting. Negative for diarrhea and heartburn.  Musculoskeletal: Negative for myalgias and neck pain.  Skin: Negative for  itching and rash.  Neurological: Negative for dizziness, focal weakness, seizures and loss of consciousness.  Endo/Heme/Allergies: Negative for environmental allergies. Does not bruise/bleed easily.  Psychiatric/Behavioral: Negative for depression and suicidal ideas.  All other systems reviewed and are negative.   Blood pressure (!) 154/65, pulse 87, temperature 98.9 F (37.2 C), temperature source Oral, resp. rate (!) 24, SpO2 92 %. Physical Exam  Constitutional: He is oriented to person, place, and time. Vital signs are normal. He appears well-developed and well-nourished.  Conversant No acute distress  HENT:  Head: Normocephalic and atraumatic.  Eyes: Conjunctivae and lids are normal. Pupils are equal, round, and reactive to light. No scleral icterus.  No lid lag Moist conjunctiva  Neck: No tracheal tenderness present. No thyromegaly present.  No cervical lymphadenopathy  Cardiovascular: Normal rate, regular rhythm and intact distal pulses.  No murmur heard. Respiratory: Effort normal and breath sounds normal. He has no wheezes. He has no rales.  GI: He exhibits distension. He exhibits no mass. There is no hepatosplenomegaly. There is tenderness (RUQ). There is no rigidity, no rebound and no guarding. No hernia.    Musculoskeletal: Normal range of motion. He exhibits no edema, tenderness or deformity.  Neurological: He is alert and oriented to person, place, and time.  Normal gait and station  Skin: Skin is warm. No rash noted. No cyanosis. Nails show no clubbing.  Normal skin turgor  Psychiatric: Judgment normal.  Appropriate affect     Assessment/Plan 83 year old male with acute cholecystitis Hypertension Hyperlipidemia History of colon cancer History of prostate cancer  1.  We will admit the patient to the hospital. 2.  N.p.o., IV fluids, IV antibiotics. 3.  Patient does have an elevated bilirubin however this could be secondary to acute inflammation of gallbladder.   Patient will require removal of gallbladder.  I will discussed this with Dr. Lucia Gaskins.  Patient does have a history of an open laparotomy incision which may complicate laparoscopic approach.  Reyes Ivan, MD 06/08/2017, 5:25 AM

## 2017-06-08 NOTE — Transfer of Care (Signed)
Immediate Anesthesia Transfer of Care Note  Patient: Julian Brown  Procedure(s) Performed: LAPAROSCOPIC CHOLECYSTECTOMY WITH LYSIS OF ADHESIONS (N/A Abdomen)  Patient Location: PACU  Anesthesia Type:General  Level of Consciousness: awake, alert , oriented and patient cooperative  Airway & Oxygen Therapy: Patient Spontanous Breathing and Patient connected to face mask  Post-op Assessment: Report given to RN and Post -op Vital signs reviewed and stable  Post vital signs: Reviewed and stable  Last Vitals:  Vitals:   06/08/17 1243 06/08/17 1515  BP: 106/80 (!) 148/70  Pulse: 93 91  Resp: (!) 28   Temp:  (!) 38.5 C  SpO2: 93% 93%    Last Pain:  Vitals:   06/08/17 1515  TempSrc: Oral  PainSc:       Patients Stated Pain Goal: 4 (43/15/40 0867)  Complications: No apparent anesthesia complications

## 2017-06-08 NOTE — Anesthesia Postprocedure Evaluation (Signed)
Anesthesia Post Note  Patient: Julian Brown  Procedure(s) Performed: LAPAROSCOPIC CHOLECYSTECTOMY WITH LYSIS OF ADHESIONS (N/A Abdomen)     Patient location during evaluation: PACU Anesthesia Type: General Level of consciousness: awake and alert Pain management: pain level controlled Vital Signs Assessment: post-procedure vital signs reviewed and stable Respiratory status: spontaneous breathing, nonlabored ventilation, respiratory function stable and patient connected to nasal cannula oxygen Cardiovascular status: blood pressure returned to baseline and stable Postop Assessment: no apparent nausea or vomiting Anesthetic complications: no    Last Vitals:  Vitals:   06/08/17 1930 06/08/17 1945  BP: (!) 149/55 136/62  Pulse: (!) 101 98  Resp: (!) 28 20  Temp:  36.8 C  SpO2: 93% 94%    Last Pain:  Vitals:   06/08/17 1945  TempSrc:   PainSc: 3                  Montez Hageman

## 2017-06-09 ENCOUNTER — Encounter (HOSPITAL_COMMUNITY): Payer: Self-pay | Admitting: Surgery

## 2017-06-09 LAB — COMPREHENSIVE METABOLIC PANEL
ALT: 22 U/L (ref 17–63)
AST: 27 U/L (ref 15–41)
Albumin: 2.5 g/dL — ABNORMAL LOW (ref 3.5–5.0)
Alkaline Phosphatase: 44 U/L (ref 38–126)
Anion gap: 7 (ref 5–15)
BUN: 27 mg/dL — ABNORMAL HIGH (ref 6–20)
CO2: 26 mmol/L (ref 22–32)
Calcium: 8.5 mg/dL — ABNORMAL LOW (ref 8.9–10.3)
Chloride: 100 mmol/L — ABNORMAL LOW (ref 101–111)
Creatinine, Ser: 1.14 mg/dL (ref 0.61–1.24)
GFR calc Af Amer: >60 mL/min (ref >60)
GFR calc non Af Amer: 58 mL/min — ABNORMAL LOW (ref >60)
Glucose, Bld: 138 mg/dL — ABNORMAL HIGH (ref 65–99)
Potassium: 4.2 mmol/L (ref 3.5–5.1)
Sodium: 133 mmol/L — ABNORMAL LOW (ref 135–145)
Total Bilirubin: 0.9 mg/dL (ref 0.3–1.2)
Total Protein: 5.7 g/dL — ABNORMAL LOW (ref 6.5–8.1)

## 2017-06-09 LAB — CBC WITH DIFFERENTIAL/PLATELET
Basophils Absolute: 0 10*3/uL (ref 0.0–0.1)
Basophils Relative: 0 %
Eosinophils Absolute: 0 10*3/uL (ref 0.0–0.7)
Eosinophils Relative: 0 %
HCT: 43.2 % (ref 39.0–52.0)
Hemoglobin: 14.7 g/dL (ref 13.0–17.0)
Lymphocytes Relative: 7 %
Lymphs Abs: 0.9 10*3/uL (ref 0.7–4.0)
MCH: 34.3 pg — ABNORMAL HIGH (ref 26.0–34.0)
MCHC: 34 g/dL (ref 30.0–36.0)
MCV: 100.7 fL — ABNORMAL HIGH (ref 78.0–100.0)
Monocytes Absolute: 2.2 10*3/uL — ABNORMAL HIGH (ref 0.1–1.0)
Monocytes Relative: 15 %
Neutro Abs: 11.2 10*3/uL — ABNORMAL HIGH (ref 1.7–7.7)
Neutrophils Relative %: 78 %
Platelets: 231 10*3/uL (ref 150–400)
RBC: 4.29 MIL/uL (ref 4.22–5.81)
RDW: 13.8 % (ref 11.5–15.5)
WBC: 14.3 10*3/uL — ABNORMAL HIGH (ref 4.0–10.5)

## 2017-06-09 MED ORDER — HEPARIN SODIUM (PORCINE) 5000 UNIT/ML IJ SOLN
5000.0000 [IU] | Freq: Three times a day (TID) | INTRAMUSCULAR | Status: DC
  Administered 2017-06-09 – 2017-06-16 (×21): 5000 [IU] via SUBCUTANEOUS
  Filled 2017-06-09 (×21): qty 1

## 2017-06-09 MED ORDER — AMLODIPINE BESYLATE 10 MG PO TABS
10.0000 mg | ORAL_TABLET | Freq: Every day | ORAL | Status: DC
  Administered 2017-06-09 – 2017-06-12 (×4): 10 mg via ORAL
  Filled 2017-06-09 (×4): qty 1

## 2017-06-09 MED ORDER — IRBESARTAN 75 MG PO TABS
37.5000 mg | ORAL_TABLET | Freq: Every day | ORAL | Status: DC
  Administered 2017-06-09 – 2017-06-12 (×4): 37.5 mg via ORAL
  Filled 2017-06-09 (×4): qty 1

## 2017-06-09 NOTE — Progress Notes (Signed)
[facility] Office:  [phone] General Surgery Progress Note   LOS: 1 day  POD -  1 Day Post-Op  Chief Complaint: Abdominal pain  Assessment and Plan: 1. LAPAROSCOPIC CHOLECYSTECTOMY WITH LYSIS OF ADHESIONS - 06/08/2017 - D. Laneya Gasaway  For infarcted gall bladder  WBC - 14,300 - 06/09/2017  Cipro/Flagyl - 12/27 >>>  To advance to full liquids, needs to ambulate more.  Will recheck labs in AM.  2.  Prior right hemicolectomy for cancer - 1998 3.  Obese  4.  DVT prophylaxis - to start SQ heparin   Active Problems:   Acute cholecystitis  Subjective:  Doing okay, but sore.  Has not moved much and not ready to go home.  Daughter, Charlean Merl, in room with patient.  Objective:   Vitals:   06/09/17 0510 06/09/17 0848  BP: (!) 142/67 (!) 144/62  Pulse: (!) 104 92  Resp: 18 17  Temp: 99.6 F (37.6 C) 98.7 F (37.1 C)  SpO2: 93% 95%     Intake/Output from previous day:  12/27 0701 - 12/28 0700 In: 3931.7 [P.O.:180; I.V.:3351.7; IV Piggyback:400] Out: 970 [Urine:400; Drains:220; Blood:50]  Intake/Output this shift:  Total I/O In: -  Out: 25 [Drains:25]   Physical Exam:   General: WN older WM who is alert and oriented.    HEENT: Normal. Pupils equal. .   Lungs: Clear.  Inc spir only 600 cc.   Abdomen: Soft, but rare BS   Wound: Clean.  Drain in RUQ - 300 cc recorded last 24 hours.   Lab Results:    Recent Labs    06/08/17 0057 06/09/17 0458  WBC 19.1* 14.3*  HGB 16.7 14.7  HCT 47.4 43.2  PLT 241 231    BMET   Recent Labs    06/08/17 0012 06/09/17 0458  NA 129* 133*  K 4.3 4.2  CL 89* 100*  CO2 24 26  GLUCOSE 193* 138*  BUN 16 27*  CREATININE 1.02 1.14  CALCIUM 9.7 8.5*    PT/INR  No results for input(s): LABPROT, INR in the last 72 hours.  ABG  No results for input(s): PHART, HCO3 in the last 72 hours.  Invalid input(s): PCO2, PO2   Studies/Results:  Ct Abdomen Pelvis W Contrast  Result Date: 06/08/2017 CLINICAL DATA:  83  y/o M; 1 week of constipation and abdominal distention. History of right colon cancer hemicolectomy as well as prostate cancer post radiation and chemotherapy. EXAM: CT ABDOMEN AND PELVIS WITH CONTRAST TECHNIQUE: Multidetector CT imaging of the abdomen and pelvis was performed using the standard protocol following bolus administration of intravenous contrast. CONTRAST:  156mL ISOVUE-300 IOPAMIDOL (ISOVUE-300) INJECTION 61% COMPARISON:  06/05/2017 CT abdomen and pelvis FINDINGS: Lower chest: Mild coronary artery calcification. Hepatobiliary: Numerous stable hepatic cyst. Interval development of gallbladder distention, pericholecystic fluid, and gallbladder wall thickening is likely representing acute cholecystitis. Cholelithiasis. No biliary ductal dilatation. Pancreas: Unremarkable. No pancreatic ductal dilatation or surrounding inflammatory changes. Spleen: Normal in size without focal abnormality. Adrenals/Urinary Tract: Adrenal glands are unremarkable. Kidneys are normal, without renal calculi, focal lesion, or hydronephrosis. Bladder is unremarkable. Stomach/Bowel: Right hemicolectomy. Patent enteric colonic anastomosis. No obstructive or inflammatory changes of the bowel. Vascular/Lymphatic: Aortic atherosclerosis. No enlarged abdominal or pelvic lymph nodes. Reproductive: Prostate seeds noted. Other: Small volume of ascites. Musculoskeletal: No fracture is seen. Mild right hip osteoarthrosis and multilevel lumbar spondylosis. IMPRESSION: 1. Interval development of gallbladder wall thickening and pericholecystic fluid compatible with acute cholecystitis. Cholelithiasis. No biliary ductal dilatation.  2. Multiple hepatic cysts. 3. Aortic atherosclerosis. Electronically Signed   By: Kristine Garbe M.D.   On: 06/08/2017 04:31   Dg Abd Acute W/chest  Result Date: 06/08/2017 CLINICAL DATA:  Acute onset of mid abdominal pain and distention. Nausea. Constipation. EXAM: DG ABDOMEN ACUTE W/ 1V CHEST  COMPARISON:  None. FINDINGS: The lungs are well-aerated. Mild bibasilar airspace opacities may reflect pneumonia. There is no evidence of pleural effusion or pneumothorax. The cardiomediastinal silhouette is within normal limits. The visualized bowel gas pattern is unremarkable. Scattered stool and air are seen within the colon; there is no evidence of small bowel dilatation to suggest obstruction. No free intra-abdominal air is identified on the provided decubitus view. No acute osseous abnormalities are seen; the sacroiliac joints are unremarkable in appearance. IMPRESSION: 1. Unremarkable bowel gas pattern; no free intra-abdominal air seen. Small to moderate amount of stool noted in the colon. 2. Mild bibasilar airspace opacities may reflect pneumonia. Electronically Signed   By: Garald Balding M.D.   On: 06/08/2017 01:11     Anti-infectives:   Anti-infectives (From admission, onward)   Start     Dose/Rate Route Frequency Ordered Stop   06/09/17 0600  ciprofloxacin (CIPRO) IVPB 400 mg     400 mg 200 mL/hr over 60 Minutes Intravenous Every 12 hours 06/08/17 2007     06/08/17 2200  metroNIDAZOLE (FLAGYL) IVPB 500 mg     500 mg 100 mL/hr over 60 Minutes Intravenous Every 8 hours 06/08/17 2007     06/08/17 1819  ciprofloxacin (CIPRO) 400 MG/200ML IVPB    Comments:  Mickley, Holly   : cabinet override      06/08/17 1819 06/08/17 1822   06/08/17 0530  ciprofloxacin (CIPRO) IVPB 400 mg     400 mg 200 mL/hr over 60 Minutes Intravenous  Once 06/08/17 0527 06/08/17 0834   06/08/17 0530  metroNIDAZOLE (FLAGYL) IVPB 500 mg     500 mg 100 mL/hr over 60 Minutes Intravenous  Once 06/08/17 0527 06/08/17 0653      [name], MD, FACS Pager: [facility] Office: [phone] 06/09/2017

## 2017-06-10 ENCOUNTER — Inpatient Hospital Stay (HOSPITAL_COMMUNITY): Payer: Medicare Other

## 2017-06-10 LAB — CBC WITH DIFFERENTIAL/PLATELET
Basophils Absolute: 0 10*3/uL (ref 0.0–0.1)
Basophils Relative: 0 %
Eosinophils Absolute: 0 10*3/uL (ref 0.0–0.7)
Eosinophils Relative: 0 %
HCT: 42.2 % (ref 39.0–52.0)
Hemoglobin: 13.9 g/dL (ref 13.0–17.0)
Lymphocytes Relative: 9 %
Lymphs Abs: 1 10*3/uL (ref 0.7–4.0)
MCH: 33.5 pg (ref 26.0–34.0)
MCHC: 32.9 g/dL (ref 30.0–36.0)
MCV: 101.7 fL — ABNORMAL HIGH (ref 78.0–100.0)
Monocytes Absolute: 1.8 10*3/uL — ABNORMAL HIGH (ref 0.1–1.0)
Monocytes Relative: 15 %
Neutro Abs: 9 10*3/uL — ABNORMAL HIGH (ref 1.7–7.7)
Neutrophils Relative %: 76 %
Platelets: 265 10*3/uL (ref 150–400)
RBC: 4.15 MIL/uL — ABNORMAL LOW (ref 4.22–5.81)
RDW: 13.7 % (ref 11.5–15.5)
WBC: 11.9 10*3/uL — ABNORMAL HIGH (ref 4.0–10.5)

## 2017-06-10 LAB — COMPREHENSIVE METABOLIC PANEL
ALT: 26 U/L (ref 17–63)
AST: 40 U/L (ref 15–41)
Albumin: 2.5 g/dL — ABNORMAL LOW (ref 3.5–5.0)
Alkaline Phosphatase: 49 U/L (ref 38–126)
Anion gap: 8 (ref 5–15)
BUN: 26 mg/dL — ABNORMAL HIGH (ref 6–20)
CO2: 26 mmol/L (ref 22–32)
Calcium: 8.4 mg/dL — ABNORMAL LOW (ref 8.9–10.3)
Chloride: 101 mmol/L (ref 101–111)
Creatinine, Ser: 0.97 mg/dL (ref 0.61–1.24)
GFR calc Af Amer: >60 mL/min (ref >60)
GFR calc non Af Amer: >60 mL/min (ref >60)
Glucose, Bld: 111 mg/dL — ABNORMAL HIGH (ref 65–99)
Potassium: 4.2 mmol/L (ref 3.5–5.1)
Sodium: 135 mmol/L (ref 135–145)
Total Bilirubin: 1.2 mg/dL (ref 0.3–1.2)
Total Protein: 5.7 g/dL — ABNORMAL LOW (ref 6.5–8.1)

## 2017-06-10 MED ORDER — PANTOPRAZOLE SODIUM 40 MG IV SOLR
40.0000 mg | Freq: Every day | INTRAVENOUS | Status: DC
  Administered 2017-06-10 – 2017-06-12 (×3): 40 mg via INTRAVENOUS
  Filled 2017-06-10 (×3): qty 40

## 2017-06-10 MED ORDER — ALBUTEROL SULFATE (2.5 MG/3ML) 0.083% IN NEBU
2.5000 mg | INHALATION_SOLUTION | Freq: Two times a day (BID) | RESPIRATORY_TRACT | Status: DC
  Administered 2017-06-11 – 2017-06-16 (×11): 2.5 mg via RESPIRATORY_TRACT
  Filled 2017-06-10 (×12): qty 3

## 2017-06-10 MED ORDER — BISACODYL 10 MG RE SUPP: 10.0000 mg | Freq: Every day | RECTAL | Status: DC | PRN

## 2017-06-10 MED ORDER — ALBUTEROL SULFATE (2.5 MG/3ML) 0.083% IN NEBU
2.5000 mg | INHALATION_SOLUTION | Freq: Four times a day (QID) | RESPIRATORY_TRACT | Status: DC
  Administered 2017-06-10 (×2): 2.5 mg via RESPIRATORY_TRACT
  Filled 2017-06-10 (×2): qty 3

## 2017-06-10 NOTE — Progress Notes (Signed)
Xray of chest shows small PE possible and/or possible early infection or aspiration. MD notified. Pt is up in chair on 2L of O2 and is not symptomatic at this time, will continue to monitor.

## 2017-06-10 NOTE — Progress Notes (Signed)
Patient ID: Julian Brown, male   DOB: 02/21/1933, 84 y.o.   MRN: [id number] 2 Days Post-Op   Subjective: Patient states he is feeling better.  Less pain.  Has been up walking in the hall and had some mild shortness of breath with oxygen saturations in the high 60s without oxygen.  Nurses placed oxygen and after walking with O2 had saturations in the high 80s.  Currently no shortness of breath.  Feels some heartburn.  No nausea or vomiting.  Has not had a bowel movement in about a week.  Objective: Vital signs in last 24 hours: Temp:  [98.5 F (36.9 C)-99.7 F (37.6 C)] 98.9 F (37.2 C) (12/29 0540) Pulse Rate:  [89-94] 92 (12/29 0540) Resp:  [18-20] 20 (12/29 0540) BP: (133-160)/(62-90) 154/68 (12/29 0540) SpO2:  [91 %-98 %] 98 % (12/29 0540) Last BM Date: 06/02/17  Intake/Output from previous day: 12/28 0701 - 12/29 0700 In: 975 [I.V.:975] Out: 985 [Urine:900; Drains:85] Intake/Output this shift: Total I/O In: 480 [P.O.:480] Out: -   General appearance: alert, cooperative, no distress and morbidly obese Resp: Mild expiratory wheezing without increased work of breathing GI: Mild right upper quadrant tenderness.  Mild distention.  JP drain without bile Incision/Wound: No erythema or drainage  Lab Results:  Recent Labs    06/09/17 0458 06/10/17 0512  WBC 14.3* 11.9*  HGB 14.7 13.9  HCT 43.2 42.2  PLT 231 265   BMET Recent Labs    06/09/17 0458 06/10/17 0512  NA 133* 135  K 4.2 4.2  CL 100* 101  CO2 26 26  GLUCOSE 138* 111*  BUN 27* 26*  CREATININE 1.14 0.97  CALCIUM 8.5* 8.4*   Hepatic Function Latest Ref Rng & Units 06/10/2017 06/09/2017 06/08/2017  Total Protein 6.5 - 8.1 g/dL 5.7(L) 5.7(L) 7.7  Albumin 3.5 - 5.0 g/dL 2.5(L) 2.5(L) 3.9  AST 15 - 41 U/L 40 27 41  ALT 17 - 63 U/L 26 22 18  Alk Phosphatase 38 - 126 U/L 49 44 67  Total Bilirubin 0.3 - 1.2 mg/dL 1.2 0.9 1.7(H)    Studies/Results: No results found.  Anti-infectives: Anti-infectives  (From admission, onward)   Start     Dose/Rate Route Frequency Ordered Stop   06/09/17 0600  ciprofloxacin (CIPRO) IVPB 400 mg     400 mg 200 mL/hr over 60 Minutes Intravenous Every 12 hours 06/08/17 2007     06/08/17 2200  metroNIDAZOLE (FLAGYL) IVPB 500 mg     500 mg 100 mL/hr over 60 Minutes Intravenous Every 8 hours 06/08/17 2007     06/08/17 1819  ciprofloxacin (CIPRO) 400 MG/200ML IVPB    Comments:  Mickley, Holly   : cabinet override      06/08/17 1819 06/08/17 1822   06/08/17 0530  ciprofloxacin (CIPRO) IVPB 400 mg     400 mg 200 mL/hr over 60 Minutes Intravenous  Once 06/08/17 0527 06/08/17 0834   06/08/17 0530  metroNIDAZOLE (FLAGYL) IVPB 500 mg     500 mg 100 mL/hr over 60 Minutes Intravenous  Once 06/08/17 0527 06/08/17 0653      Assessment/Plan: s/p Procedure(s): LAPAROSCOPIC CHOLECYSTECTOMY WITH LYSIS OF ADHESIONS Severe necrotic cholecystitis Abdomen seems improved, white blood count decreasing and JP drainage is clear.  Continue IV antibiotics Hypoxia with ambulation and mild wheezing.  Check chest x-ray.  Have ordered HHN with albuterol.  Continue oxygen and monitor O2 sats.  Continue pulmonary toilet and ambulation. Protonix for reflux.   LOS: 2 days      Benjamin T Hoxworth 06/10/2017 

## 2017-06-11 LAB — CBC
HCT: 40.4 % (ref 39.0–52.0)
Hemoglobin: 13.3 g/dL (ref 13.0–17.0)
MCH: 33.6 pg (ref 26.0–34.0)
MCHC: 32.9 g/dL (ref 30.0–36.0)
MCV: 102 fL — ABNORMAL HIGH (ref 78.0–100.0)
Platelets: 272 10*3/uL (ref 150–400)
RBC: 3.96 MIL/uL — ABNORMAL LOW (ref 4.22–5.81)
RDW: 13.6 % (ref 11.5–15.5)
WBC: 8.8 10*3/uL (ref 4.0–10.5)

## 2017-06-11 NOTE — Progress Notes (Signed)
Patient ID: Julian Brown, male   DOB: Mar 07, 1933, 83 y.o.   MRN: [id number] 3 Days Post-Op   Subjective: Patient states he is feeling somewhat better.  Less pain.  Has been up walking in the hall and is having less shortness of breath.  Feels some heartburn.  No nausea or vomiting.    Objective: Vital signs in last 24 hours: Temp:  [98.6 F (37 C)-99.3 F (37.4 C)] 99.3 F (37.4 C) (12/30 0556) Pulse Rate:  [75-80] 75 (12/30 0556) Resp:  [20] 20 (12/30 0556) BP: (130-161)/(55-67) 150/67 (12/30 0556) SpO2:  [88 %-95 %] 93 % (12/30 0826) Last BM Date: 06/02/17  Intake/Output from previous day: 12/29 0701 - 12/30 0700 In: [place] [P.O.:1080; I.V.:1371; IV Piggyback:1000] Out: 1570 [Urine:1550; Drains:20] Intake/Output this shift: Total I/O In: -  Out: 625 [Urine:600; Drains:25]  General appearance: alert, cooperative, no distress and morbidly obese Resp: Mild expiratory wheezing without increased work of breathing GI: Mild right upper quadrant tenderness.  Mild distention.  JP drain without bile Incision/Wound: No erythema or drainage  Lab Results:  Recent Labs    06/10/17 0512 06/11/17 0511  WBC 11.9* 8.8  HGB 13.9 13.3  HCT 42.2 40.4  PLT 265 272   BMET Recent Labs    06/09/17 0458 06/10/17 0512  NA 133* 135  K 4.2 4.2  CL 100* 101  CO2 26 26  GLUCOSE 138* 111*  BUN 27* 26*  CREATININE 1.14 0.97  CALCIUM 8.5* 8.4*   Hepatic Function Latest Ref Rng & Units 06/10/2017 06/09/2017 06/08/2017  Total Protein 6.5 - 8.1 g/dL 5.7(L) 5.7(L) 7.7  Albumin 3.5 - 5.0 g/dL 2.5(L) 2.5(L) 3.9  AST 15 - 41 U/L 40 27 41  ALT 17 - 63 U/L _0 Alk Phosphatase 38 - 126 U/L 49 44 67  Total Bilirubin 0.3 - 1.2 mg/dL 1.2 0.9 1.7(H)    Studies/Results: Dg Chest 2 View  Result Date: 06/10/2017 CLINICAL DATA:  Cholecystectomy 2 days ago. Shortness of breath. Ex-smoker. EXAM: CHEST  2 VIEW COMPARISON:  06/08/2017 acute abdomen series. Chest radiograph of 06/05/2017  FINDINGS: Midline trachea. Borderline cardiomegaly. Mediastinal contours otherwise within normal limits. Possible small right pleural effusion. No pneumothorax. Right hemidiaphragm elevation. Mild bibasilar airspace disease. IMPRESSION: Mild bibasilar airspace disease, favored to represent atelectasis. Early infection or aspiration cannot be excluded. Possible small right pleural effusion. Electronically Signed   By: Abigail Miyamoto M.D.   On: 06/10/2017 12:19    Anti-infectives: Anti-infectives (From admission, onward)   Start     Dose/Rate Route Frequency Ordered Stop   06/09/17 0600  ciprofloxacin (CIPRO) IVPB 400 mg     400 mg 200 mL/hr over 60 Minutes Intravenous Every 12 hours 06/08/17 2007     06/08/17 2200  metroNIDAZOLE (FLAGYL) IVPB 500 mg     500 mg 100 mL/hr over 60 Minutes Intravenous Every 8 hours 06/08/17 2007     06/08/17 1819  ciprofloxacin (CIPRO) 400 MG/200ML IVPB    Comments:  Mickley, Holly   : cabinet override      06/08/17 1819 06/08/17 1822   06/08/17 0530  ciprofloxacin (CIPRO) IVPB 400 mg     400 mg 200 mL/hr over 60 Minutes Intravenous  Once 06/08/17 0527 06/08/17 0834   06/08/17 0530  metroNIDAZOLE (FLAGYL) IVPB 500 mg     500 mg 100 mL/hr over 60 Minutes Intravenous  Once 06/08/17 0527 06/08/17 0653      Assessment/Plan: s/p Procedure(s): LAPAROSCOPIC CHOLECYSTECTOMY WITH LYSIS  OF ADHESIONS Severe necrotic cholecystitis Abdomen ok, white blood count decreasing and JP drainage is clear.  Continue IV antibiotics Hypoxia with ambulation and mild wheezing.  CXR looked ok.  Cont albuterol.  Continue oxygen and monitor O2 sats.  Continue pulmonary toilet and ambulation. Protonix for reflux.   LOS: 3 days    Derk Doubek C. 10/28/3356

## 2017-06-12 LAB — TROPONIN I: Troponin I: <0.03 ng/mL (ref <0.03)

## 2017-06-12 MED ORDER — DOCUSATE SODIUM 100 MG PO CAPS
100.0000 mg | ORAL_CAPSULE | Freq: Two times a day (BID) | ORAL | Status: DC
  Administered 2017-06-12 (×2): 100 mg via ORAL
  Filled 2017-06-12 (×2): qty 1

## 2017-06-12 MED ORDER — POLYETHYLENE GLYCOL 3350 17 G PO PACK
17.0000 g | PACK | Freq: Every day | ORAL | Status: DC
  Administered 2017-06-12: 17 g via ORAL
  Filled 2017-06-12: qty 1

## 2017-06-12 MED ORDER — SODIUM CHLORIDE 0.9% FLUSH
10.0000 mL | INTRAVENOUS | Status: DC | PRN
  Administered 2017-06-13 – 2017-06-16 (×3): 10 mL
  Filled 2017-06-12 (×3): qty 40

## 2017-06-12 MED ORDER — FUROSEMIDE 10 MG/ML IJ SOLN
20.0000 mg | Freq: Once | INTRAMUSCULAR | Status: AC
Start: 2017-06-12 — End: 2017-06-12
  Administered 2017-06-12: 20 mg via INTRAVENOUS
  Filled 2017-06-12: qty 2

## 2017-06-12 NOTE — Progress Notes (Signed)
4 Days Post-Op   Subjective/Chief Complaint: Complains of burning in chest   Objective: Vital signs in last 24 hours: Temp:  [98.7 F (37.1 C)] 98.7 F (37.1 C) (12/31 0616) Pulse Rate:  [78-83] 78 (12/31 0616) Resp:  [20] 20 (12/31 0616) BP: (139-156)/(53-63) 139/63 (12/31 0922) SpO2:  [94 %-98 %] 94 % (12/31 0940) Last BM Date: 06/02/17  Intake/Output from previous day: 12/30 0701 - 12/31 0700 In: 940 [P.O.:540; I.V.:100; IV Piggyback:300] Out: 2445 [Urine:1575; Drains:320; Stool:550] Intake/Output this shift: No intake/output data recorded.  General appearance: alert and cooperative Resp: wheezes LUL Cardio: regular rate and rhythm GI: soft, mild tenderness. drain output serosanguinous  Lab Results:  Recent Labs    06/10/17 0512 06/11/17 0511  WBC 11.9* 8.8  HGB 13.9 13.3  HCT 42.2 40.4  PLT 265 272   BMET Recent Labs    06/10/17 0512  NA 135  K 4.2  CL 101  CO2 26  GLUCOSE 111*  BUN 26*  CREATININE 0.97  CALCIUM 8.4*   PT/INR No results for input(s): LABPROT, INR in the last 72 hours. ABG No results for input(s): PHART, HCO3 in the last 72 hours.  Invalid input(s): PCO2, PO2  Studies/Results: Dg Chest 2 View  Result Date: 06/10/2017 CLINICAL DATA:  Cholecystectomy 2 days ago. Shortness of breath. Ex-smoker. EXAM: CHEST  2 VIEW COMPARISON:  06/08/2017 acute abdomen series. Chest radiograph of 06/05/2017 FINDINGS: Midline trachea. Borderline cardiomegaly. Mediastinal contours otherwise within normal limits. Possible small right pleural effusion. No pneumothorax. Right hemidiaphragm elevation. Mild bibasilar airspace disease. IMPRESSION: Mild bibasilar airspace disease, favored to represent atelectasis. Early infection or aspiration cannot be excluded. Possible small right pleural effusion. Electronically Signed   By: Abigail Miyamoto M.D.   On: 06/10/2017 12:19    Anti-infectives: Anti-infectives (From admission, onward)   Start     Dose/Rate Route  Frequency Ordered Stop   06/09/17 0600  ciprofloxacin (CIPRO) IVPB 400 mg     400 mg 200 mL/hr over 60 Minutes Intravenous Every 12 hours 06/08/17 2007     06/08/17 2200  metroNIDAZOLE (FLAGYL) IVPB 500 mg     500 mg 100 mL/hr over 60 Minutes Intravenous Every 8 hours 06/08/17 2007     06/08/17 1819  ciprofloxacin (CIPRO) 400 MG/200ML IVPB    Comments:  Zigmund Daniel   : cabinet override      06/08/17 1819 06/08/17 1822   06/08/17 0530  ciprofloxacin (CIPRO) IVPB 400 mg     400 mg 200 mL/hr over 60 Minutes Intravenous  Once 06/08/17 0527 06/08/17 0834   06/08/17 0530  metroNIDAZOLE (FLAGYL) IVPB 500 mg     500 mg 100 mL/hr over 60 Minutes Intravenous  Once 06/08/17 0527 06/08/17 0653      Assessment/Plan: s/p Procedure(s): LAPAROSCOPIC CHOLECYSTECTOMY WITH LYSIS OF ADHESIONS (N/A) Advance diet  Continue albuterol for wheeze. Add 1 dose of lasix today as he may be a little volume overloaded Ambulate Colace and miralax for constipation Incentive spirometry  LOS: 4 days    TOTH III,Elexa Kivi S 06/12/2017

## 2017-06-13 ENCOUNTER — Encounter (HOSPITAL_COMMUNITY): Payer: Self-pay | Admitting: Surgery

## 2017-06-13 DIAGNOSIS — E785 Hyperlipidemia, unspecified: Secondary | ICD-10-CM | POA: Diagnosis present

## 2017-06-13 DIAGNOSIS — I10 Essential (primary) hypertension: Secondary | ICD-10-CM | POA: Diagnosis present

## 2017-06-13 LAB — COMPREHENSIVE METABOLIC PANEL
ALT: 37 U/L (ref 17–63)
AST: 39 U/L (ref 15–41)
Albumin: 2.8 g/dL — ABNORMAL LOW (ref 3.5–5.0)
Alkaline Phosphatase: 47 U/L (ref 38–126)
Anion gap: 7 (ref 5–15)
BUN: 18 mg/dL (ref 6–20)
CO2: 27 mmol/L (ref 22–32)
Calcium: 8.8 mg/dL — ABNORMAL LOW (ref 8.9–10.3)
Chloride: 99 mmol/L — ABNORMAL LOW (ref 101–111)
Creatinine, Ser: 0.83 mg/dL (ref 0.61–1.24)
GFR calc Af Amer: >60 mL/min (ref >60)
GFR calc non Af Amer: >60 mL/min (ref >60)
Glucose, Bld: 127 mg/dL — ABNORMAL HIGH (ref 65–99)
Potassium: 3.6 mmol/L (ref 3.5–5.1)
Sodium: 133 mmol/L — ABNORMAL LOW (ref 135–145)
Total Bilirubin: 0.6 mg/dL (ref 0.3–1.2)
Total Protein: 5.9 g/dL — ABNORMAL LOW (ref 6.5–8.1)

## 2017-06-13 LAB — CBC WITH DIFFERENTIAL/PLATELET
Basophils Absolute: 0 10*3/uL (ref 0.0–0.1)
Basophils Relative: 0 %
Eosinophils Absolute: 0 10*3/uL (ref 0.0–0.7)
Eosinophils Relative: 0 %
HCT: 39.7 % (ref 39.0–52.0)
Hemoglobin: 13.5 g/dL (ref 13.0–17.0)
Lymphocytes Relative: 10 %
Lymphs Abs: 0.9 10*3/uL (ref 0.7–4.0)
MCH: 33.8 pg (ref 26.0–34.0)
MCHC: 34 g/dL (ref 30.0–36.0)
MCV: 99.3 fL (ref 78.0–100.0)
Monocytes Absolute: 1.5 10*3/uL — ABNORMAL HIGH (ref 0.1–1.0)
Monocytes Relative: 16 %
Neutro Abs: 6.8 10*3/uL (ref 1.7–7.7)
Neutrophils Relative %: 74 %
Platelets: 319 10*3/uL (ref 150–400)
RBC: 4 MIL/uL — ABNORMAL LOW (ref 4.22–5.81)
RDW: 13.6 % (ref 11.5–15.5)
WBC: 9.3 10*3/uL (ref 4.0–10.5)

## 2017-06-13 LAB — PROTIME-INR
INR: 1.24
Prothrombin Time: 15.5 seconds — ABNORMAL HIGH (ref 11.4–15.2)

## 2017-06-13 LAB — LIPASE, BLOOD: Lipase: 98 U/L — ABNORMAL HIGH (ref 11–51)

## 2017-06-13 MED ORDER — SODIUM CHLORIDE 0.9 % IV SOLN: 250.0000 mL | INTRAVENOUS | Status: DC | PRN

## 2017-06-13 MED ORDER — ACETAMINOPHEN 500 MG PO TABS: 1000.0000 mg | ORAL_TABLET | Freq: Four times a day (QID) | ORAL | Status: DC | PRN

## 2017-06-13 MED ORDER — ROSUVASTATIN CALCIUM 10 MG PO TABS
10.0000 mg | ORAL_TABLET | Freq: Every day | ORAL | Status: DC
  Administered 2017-06-13 – 2017-06-16 (×4): 10 mg via ORAL
  Filled 2017-06-13 (×4): qty 1

## 2017-06-13 MED ORDER — PSYLLIUM 95 % PO PACK
1.0000 | PACK | Freq: Two times a day (BID) | ORAL | Status: DC
  Administered 2017-06-13 – 2017-06-16 (×6): 1 via ORAL
  Filled 2017-06-13 (×6): qty 1

## 2017-06-13 MED ORDER — MAGNESIUM HYDROXIDE 400 MG/5ML PO SUSP: 5.0000 mL | Freq: Every day | ORAL | Status: DC | PRN

## 2017-06-13 MED ORDER — HYDROCORTISONE 2.5 % RE CREA: 1.0000 "application " | TOPICAL_CREAM | Freq: Four times a day (QID) | RECTAL | Status: DC | PRN

## 2017-06-13 MED ORDER — DEXTROSE 5 % IV SOLN
1000.0000 mg | Freq: Four times a day (QID) | INTRAVENOUS | Status: DC | PRN
  Filled 2017-06-13: qty 10

## 2017-06-13 MED ORDER — LIP MEDEX EX OINT
1.0000 "application " | TOPICAL_OINTMENT | Freq: Two times a day (BID) | CUTANEOUS | Status: DC
  Administered 2017-06-13 – 2017-06-16 (×6): 1 via TOPICAL
  Filled 2017-06-13: qty 7

## 2017-06-13 MED ORDER — ACETAMINOPHEN 500 MG PO TABS
1000.0000 mg | ORAL_TABLET | Freq: Three times a day (TID) | ORAL | Status: DC
  Administered 2017-06-13 – 2017-06-16 (×10): 1000 mg via ORAL
  Filled 2017-06-13 (×10): qty 2

## 2017-06-13 MED ORDER — BISACODYL 10 MG RE SUPP: 10.0000 mg | Freq: Two times a day (BID) | RECTAL | Status: DC | PRN

## 2017-06-13 MED ORDER — BISACODYL 10 MG RE SUPP
10.0000 mg | Freq: Every day | RECTAL | Status: DC
Start: 2017-06-13 — End: 2017-06-16
  Filled 2017-06-13 (×3): qty 1

## 2017-06-13 MED ORDER — CIPROFLOXACIN HCL 500 MG PO TABS
500.0000 mg | ORAL_TABLET | Freq: Two times a day (BID) | ORAL | Status: AC
  Administered 2017-06-13 – 2017-06-16 (×6): 500 mg via ORAL
  Filled 2017-06-13 (×6): qty 1

## 2017-06-13 MED ORDER — GUAIFENESIN-DM 100-10 MG/5ML PO SYRP: 10.0000 mL | ORAL_SOLUTION | ORAL | Status: DC | PRN

## 2017-06-13 MED ORDER — POTASSIUM CHLORIDE CRYS ER 20 MEQ PO TBCR
40.0000 meq | EXTENDED_RELEASE_TABLET | Freq: Every day | ORAL | Status: AC
  Administered 2017-06-13 – 2017-06-15 (×3): 40 meq via ORAL
  Filled 2017-06-13 (×3): qty 2

## 2017-06-13 MED ORDER — FUROSEMIDE 40 MG PO TABS
40.0000 mg | ORAL_TABLET | Freq: Every day | ORAL | Status: AC
  Administered 2017-06-13 – 2017-06-14 (×2): 40 mg via ORAL
  Filled 2017-06-13 (×2): qty 1

## 2017-06-13 MED ORDER — AMLODIPINE BESYLATE 10 MG PO TABS
10.0000 mg | ORAL_TABLET | Freq: Every day | ORAL | Status: DC
  Administered 2017-06-13 – 2017-06-16 (×4): 10 mg via ORAL
  Filled 2017-06-13 (×4): qty 1

## 2017-06-13 MED ORDER — POLYETHYLENE GLYCOL 3350 17 G PO PACK
34.0000 g | PACK | Freq: Once | ORAL | Status: AC
  Administered 2017-06-13: 34 g via ORAL
  Filled 2017-06-13: qty 2

## 2017-06-13 MED ORDER — PANTOPRAZOLE SODIUM 40 MG PO TBEC
40.0000 mg | DELAYED_RELEASE_TABLET | Freq: Two times a day (BID) | ORAL | Status: DC
  Administered 2017-06-13 – 2017-06-16 (×7): 40 mg via ORAL
  Filled 2017-06-13 (×7): qty 1

## 2017-06-13 MED ORDER — PHENOL 1.4 % MT LIQD: 1.0000 | OROMUCOSAL | Status: DC | PRN

## 2017-06-13 MED ORDER — SACCHAROMYCES BOULARDII 250 MG PO CAPS
250.0000 mg | ORAL_CAPSULE | Freq: Two times a day (BID) | ORAL | Status: DC
  Administered 2017-06-13 – 2017-06-16 (×7): 250 mg via ORAL
  Filled 2017-06-13 (×7): qty 1

## 2017-06-13 MED ORDER — IRBESARTAN 300 MG PO TABS
300.0000 mg | ORAL_TABLET | Freq: Every day | ORAL | Status: DC
  Administered 2017-06-13 – 2017-06-16 (×4): 300 mg via ORAL
  Filled 2017-06-13 (×4): qty 1

## 2017-06-13 MED ORDER — HYDROCHLOROTHIAZIDE 12.5 MG PO CAPS
12.5000 mg | ORAL_CAPSULE | Freq: Every day | ORAL | Status: DC
  Administered 2017-06-13 – 2017-06-16 (×4): 12.5 mg via ORAL
  Filled 2017-06-13 (×4): qty 1

## 2017-06-13 MED ORDER — LACTATED RINGERS IV BOLUS (SEPSIS): 1000.0000 mL | Freq: Three times a day (TID) | INTRAVENOUS | Status: AC | PRN

## 2017-06-13 MED ORDER — OLMESARTAN MEDOXOMIL-HCTZ 40-12.5 MG PO TABS: 1.0000 | ORAL_TABLET | Freq: Every day | ORAL | Status: DC

## 2017-06-13 MED ORDER — VITAMIN D 1000 UNITS PO TABS: 2000.0000 [IU] | ORAL_TABLET | Freq: Every day | ORAL | Status: DC

## 2017-06-13 MED ORDER — METHOCARBAMOL 500 MG PO TABS: 1000.0000 mg | ORAL_TABLET | Freq: Four times a day (QID) | ORAL | Status: DC | PRN

## 2017-06-13 MED ORDER — SODIUM CHLORIDE 0.9% FLUSH
3.0000 mL | Freq: Two times a day (BID) | INTRAVENOUS | Status: DC
  Administered 2017-06-13 – 2017-06-16 (×6): 3 mL via INTRAVENOUS

## 2017-06-13 MED ORDER — ASPIRIN EC 81 MG PO TBEC
81.0000 mg | DELAYED_RELEASE_TABLET | Freq: Every day | ORAL | Status: DC
  Administered 2017-06-13 – 2017-06-16 (×4): 81 mg via ORAL
  Filled 2017-06-13 (×4): qty 1

## 2017-06-13 MED ORDER — MENTHOL 3 MG MT LOZG: 1.0000 | LOZENGE | OROMUCOSAL | Status: DC | PRN

## 2017-06-13 MED ORDER — MAGIC MOUTHWASH
15.0000 mL | Freq: Four times a day (QID) | ORAL | Status: DC | PRN
  Filled 2017-06-13: qty 15

## 2017-06-13 MED ORDER — SODIUM CHLORIDE 0.9% FLUSH: 3.0000 mL | INTRAVENOUS | Status: DC | PRN

## 2017-06-13 MED ORDER — METHOCARBAMOL 1000 MG/10ML IJ SOLN
1000.0000 mg | Freq: Four times a day (QID) | INTRAVENOUS | Status: DC | PRN
  Filled 2017-06-13: qty 10

## 2017-06-13 MED ORDER — ALUM & MAG HYDROXIDE-SIMETH 200-200-20 MG/5ML PO SUSP
30.0000 mL | Freq: Four times a day (QID) | ORAL | Status: DC | PRN
  Administered 2017-06-13: 30 mL via ORAL
  Filled 2017-06-13: qty 30

## 2017-06-13 MED ORDER — METOPROLOL TARTRATE 5 MG/5ML IV SOLN
5.0000 mg | Freq: Four times a day (QID) | INTRAVENOUS | Status: DC | PRN
Start: 2017-06-13 — End: 2017-06-14

## 2017-06-13 MED ORDER — ENSURE SURGERY PO LIQD
237.0000 mL | Freq: Two times a day (BID) | ORAL | Status: DC
  Administered 2017-06-13 – 2017-06-16 (×5): 237 mL via ORAL
  Filled 2017-06-13 (×8): qty 237

## 2017-06-13 MED ORDER — PROCHLORPERAZINE EDISYLATE 5 MG/ML IJ SOLN: 5.0000 mg | INTRAMUSCULAR | Status: DC | PRN

## 2017-06-13 MED ORDER — GABAPENTIN 300 MG PO CAPS
300.0000 mg | ORAL_CAPSULE | Freq: Every day | ORAL | Status: AC
  Administered 2017-06-13 – 2017-06-15 (×3): 300 mg via ORAL
  Filled 2017-06-13 (×3): qty 1

## 2017-06-13 MED ORDER — FLUTICASONE PROPIONATE 50 MCG/ACT NA SUSP
1.0000 | Freq: Every day | NASAL | Status: DC
  Administered 2017-06-13: 1 via NASAL
  Filled 2017-06-13: qty 16

## 2017-06-13 MED ORDER — DIPHENHYDRAMINE HCL 25 MG PO CAPS
25.0000 mg | ORAL_CAPSULE | Freq: Four times a day (QID) | ORAL | Status: DC | PRN
Start: 2017-06-13 — End: 2017-06-16
  Filled 2017-06-13: qty 1

## 2017-06-13 MED ORDER — FLUTICASONE FUROATE-VILANTEROL 100-25 MCG/INH IN AEPB
1.0000 | INHALATION_SPRAY | Freq: Every day | RESPIRATORY_TRACT | Status: DC
  Administered 2017-06-14 – 2017-06-15 (×2): 1 via RESPIRATORY_TRACT
  Filled 2017-06-13: qty 28

## 2017-06-13 MED ORDER — HYDROCORTISONE 1 % EX CREA
1.0000 | TOPICAL_CREAM | Freq: Three times a day (TID) | CUTANEOUS | Status: DC | PRN
Start: 2017-06-13 — End: 2017-06-16

## 2017-06-13 MED ORDER — PANTOPRAZOLE SODIUM 40 MG PO TBEC: 40.0000 mg | DELAYED_RELEASE_TABLET | Freq: Every day | ORAL | Status: DC

## 2017-06-13 MED ORDER — TRAMADOL HCL 50 MG PO TABS: 50.0000 mg | ORAL_TABLET | Freq: Four times a day (QID) | ORAL | Status: DC | PRN

## 2017-06-13 NOTE — Progress Notes (Signed)
[facility]  [address] [place], [place] [address] Phone: [phone]  FAX: [phone]      TEE RICHESON [id number] November 25, 1932  CARE TEAM:  PCP: Prince Solian, MD  Outpatient Care Team: Patient Care Team: Prince Solian, MD as PCP - General (Internal Medicine)  Inpatient Treatment Team: Treatment Team: Attending Provider: Edison Pace, Md, MD; Technician: Abbe Amsterdam, NT; Technician: Sueanne Margarita, NT; Technician: Leda Quail, NT; Registered Nurse: Celedonio Savage, RN   Problem List:   Principal Problem:   Acute gangrenous cholecystitis s/p lap cholecystectomy 06/08/2017 Active Problems:   Hypertension   Hyperlipemia   5 Days Post-Op  06/08/2017  Procedure(s): LAPAROSCOPIC CHOLECYSTECTOMY WITH LYSIS OF ADHESIONS   Assessment  Guarded but seems better today  Plan:  -Recheck labs.  If no hypokalemia, continue diuresis.  If worsening white count or liver function test, obtain CT scan to rule out abscess or other intra-abdominal problems.  If patient feels better and numbers are better, consider removing the drain tomorrow.  Antibiotics Day 5.  7 days postop.  Switch Cipro to oral.  Hold off on switching to oral Flagyl until taking p.o. better.  Supplemental shakes.  Hypertension control.  More aggressive bowel regimen to help override severe constipation.  If not successful, may try enema tomorrow.  Seen if we can wean oxygen.  -VTE prophylaxis- SCDs, etc  -mobilize as tolerated to help recovery  30 minutes spent in review, evaluation, examination, counseling, and coordination of care.  More than 50% of that time was spent in counseling.  Adin Hector, M.D., F.A.C.S. Gastrointestinal and Minimally Invasive Surgery [facility] [address], [place], [place] [address] [phone] Main / Paging   06/13/2017    Subjective: (Chief  complaint)  Had rough morning yesterday.  Heartburn.  Wheezing and short of breath.  Felt better after urinating 1.5 L off with diuretics.  Hungry for solid food.  Tired of the liquids  Wants to get up and walk but still having some wheezing.  Daughter in room pleasant.  She is hopeful that he is better this morning.  She is appreciative but curious with persistent daily questions and concerns.  Temperature 99.5, dysphagia to cold liquids, occasional gurgling while he is sleeping, persistent constipation.  Objective:  Vital signs:  Vitals:   06/12/17 1936 06/12/17 2135 06/13/17 0639 06/13/17 0810  BP:  (!) 133/57 (!) 147/65   Pulse:  76 77   Resp:  (!) 21 18   Temp:  99.5 F (37.5 C) 99.5 F (37.5 C)   TempSrc:  Oral Oral   SpO2: 92% 92% 95% 96%  Weight:      Height:        Last BM Date: 06/02/17  Intake/Output   Yesterday:  12/31 0701 - 01/01 0700 In: 1980 [P.O.:720; I.V.:360; IV Piggyback:900] Out: 2267 [Urine:2250; Drains:17] This shift:  No intake/output data recorded.  Bowel function:  Flatus: YES  BM:  No  Drain: Serosanguinous   Physical Exam:  General: Pt awake/alert/oriented x4 in no acute distress Eyes: PERRL, normal EOM.  Sclera clear.  No icterus Neuro: CN II-XII intact w/o focal sensory/motor deficits. Lymph: No head/neck/groin lymphadenopathy Psych:  No delerium/psychosis/paranoia HENT: Normocephalic, Mucus membranes moist.  No thrush Neck: Supple, No tracheal deviation Chest: No chest wall pain w good excursion CV:  Pulses intact.  Regular rhythm MS: Normal AROM mjr joints.  No obvious deformity  Abdomen: Somewhat firm.  Moderately distended.  Nontender.  No evidence of peritonitis.  No incarcerated hernias.  Ext:  No deformity.  1+ edema.  No cyanosis Skin: No petechiae / purpura  Results:   Labs: Results for orders placed or performed during the hospital encounter of 06/07/17 (from the past 48 hour(s))  Troponin I     Status: None    Collection Time: 06/12/17 10:33 AM  Result Value Ref Range   Troponin I <0.03 <0.03 ng/mL    Imaging / Studies: No results found.  Medications / Allergies: per chart  Antibiotics: Anti-infectives (From admission, onward)   Start     Dose/Rate Route Frequency Ordered Stop   06/09/17 0600  ciprofloxacin (CIPRO) IVPB 400 mg     400 mg 200 mL/hr over 60 Minutes Intravenous Every 12 hours 06/08/17 2007     06/08/17 2200  metroNIDAZOLE (FLAGYL) IVPB 500 mg     500 mg 100 mL/hr over 60 Minutes Intravenous Every 8 hours 06/08/17 2007     06/08/17 1819  ciprofloxacin (CIPRO) 400 MG/200ML IVPB    Comments:  Mickley, Holly   : cabinet override      06/08/17 1819 06/08/17 1822   06/08/17 0530  ciprofloxacin (CIPRO) IVPB 400 mg     400 mg 200 mL/hr over 60 Minutes Intravenous  Once 06/08/17 0527 06/08/17 0834   06/08/17 0530  metroNIDAZOLE (FLAGYL) IVPB 500 mg     500 mg 100 mL/hr over 60 Minutes Intravenous  Once 06/08/17 7741 06/08/17 4239        Note: Portions of this report may have been transcribed using voice recognition software. Every effort was made to ensure accuracy; however, inadvertent computerized transcription errors may be present.   Any transcriptional errors that result from this process are unintentional.     Adin Hector, M.D., F.A.C.S. Gastrointestinal and Minimally Invasive Surgery [facility] [address], [place], [place] [address] [phone] Main / Paging   06/13/2017

## 2017-06-14 MED ORDER — METOPROLOL TARTRATE 5 MG/5ML IV SOLN: 5.0000 mg | Freq: Four times a day (QID) | INTRAVENOUS | Status: DC | PRN

## 2017-06-14 MED ORDER — SUCRALFATE 1 G PO TABS
1.0000 g | ORAL_TABLET | Freq: Three times a day (TID) | ORAL | Status: DC
  Administered 2017-06-14 – 2017-06-16 (×8): 1 g via ORAL
  Filled 2017-06-14 (×8): qty 1

## 2017-06-14 NOTE — Progress Notes (Signed)
6 Days Post-Op    CC: Abdominal pain  Subjective: He looks good, but still on O2, drops sats walking. Not using the I-S very often.  He is an old smoker but quit 40 some years ago.  1 of his major complaints and his wife's is his GERD symptoms.  He is followed by Dr. Cristina Brown but has had a hard time getting scheduled.  This problem appears to be at least a year or year and a half old.  He just got his first solid food today.  And his bowels are working.  Port sites are okay summer little red but Brown not bad.  Objective: Vital signs in last 24 hours: Temp:  [98.1 F (36.7 C)-98.6 F (37 C)] 98.5 F (36.9 C) (01/02 0539) Pulse Rate:  [68-72] 72 (01/02 0539) Resp:  [18] 18 (01/02 0539) BP: (129-140)/(53-59) 129/56 (01/02 0539) SpO2:  [92 %-97 %] 95 % (01/02 1032) Last BM Date: 06/13/17 480 p.o. listed yesterday 1250 urine Drain 10 BM x4 Afebrile vital signs are stable. Labs show slightly elevated glucose lipase is 98.  Otherwise unremarkable CXR on12/29:  Mild bibasilar airspace disease, favored to represent atelectasis. Early infection or aspiration cannot be excluded. Possible small right pleural effusion.   Intake/Output from previous day: 01/01 0701 - 01/02 0700 In: 690 [P.O.:480; I.V.:10; IV Piggyback:200] Out: 1260 [Urine:1250; Drains:10] Intake/Output this shift: Total I/O In: 480 [P.O.:480] Out: 1005 [Urine:1000; Drains:5]  General appearance: alert, cooperative and no distress Resp: clear to auscultation bilaterally and No wheezing but still on some O2.  O2 sats are 91-92% resting off the oxygen. Cardio: regular rate and rhythm, S1, S2 normal, no murmur, click, rub or gallop GI: Soft minimal distention.  Positive bowel sounds.  BM yesterday port sites are okay couple of them are a little red.  Lab Results:  Recent Labs    06/13/17 1016  WBC 9.3  HGB 13.5  HCT 39.7  PLT 319    BMET Recent Labs    06/13/17 1016  NA 133*  K 3.6  CL 99*  CO2 27   GLUCOSE 127*  BUN 18  CREATININE 0.83  CALCIUM 8.8*   PT/INR Recent Labs    06/13/17 1016  LABPROT 15.5*  INR 1.24    Recent Labs  Lab 06/08/17 0012 06/09/17 0458 06/10/17 0512 06/13/17 1016  AST 41 27 40 39  ALT 18 22 26  37  ALKPHOS 67 44 49 47  BILITOT 1.7* 0.9 1.2 0.6  PROT 7.7 5.7* 5.7* 5.9*  ALBUMIN 3.9 2.5* 2.5* 2.8*     Lipase     Component Value Date/Time   LIPASE 98 (H) 06/13/2017 1016     Medications: . acetaminophen  1,000 mg Oral TID  . albuterol  2.5 mg Nebulization BID  . amLODipine  10 mg Oral Daily  . aspirin EC  81 mg Oral Daily  . bisacodyl  10 mg Rectal Daily  . ciprofloxacin  500 mg Oral BID  . feeding supplement  237 mL Oral BID BM  . fluticasone  1 spray Each Nare Daily  . fluticasone furoate-vilanterol  1 puff Inhalation Daily  . gabapentin  300 mg Oral QHS  . heparin injection (subcutaneous)  5,000 Units Subcutaneous Q8H  . irbesartan  300 mg Oral Daily   And  . hydrochlorothiazide  12.5 mg Oral Daily  . lip balm  1 application Topical BID  . pantoprazole  40 mg Oral BID  . potassium chloride  40 mEq Oral  Daily  . psyllium  1 packet Oral BID  . rosuvastatin  10 mg Oral Daily  . saccharomyces boulardii  250 mg Oral BID  . sodium chloride flush  3 mL Intravenous Q12H   . sodium chloride    . lactated ringers    . methocarbamol (ROBAXIN)  IV    . metronidazole Stopped (06/14/17 0711)   Anti-infectives (From admission, onward)   Start     Dose/Rate Route Frequency Ordered Stop   06/13/17 2000  ciprofloxacin (CIPRO) tablet 500 mg     500 mg Oral 2 times daily 06/13/17 0905 06/16/17 1959   06/09/17 0600  ciprofloxacin (CIPRO) IVPB 400 mg  Status:  Discontinued     400 mg 200 mL/hr over 60 Minutes Intravenous Every 12 hours 06/08/17 2007 06/13/17 0905   06/08/17 2200  metroNIDAZOLE (FLAGYL) IVPB 500 mg     500 mg 100 mL/hr over 60 Minutes Intravenous Every 8 hours 06/08/17 2007     06/08/17 1819  ciprofloxacin (CIPRO) 400  MG/200ML IVPB    Comments:  Mickley, Holly   : cabinet override      06/08/17 1819 06/08/17 1822   06/08/17 0530  ciprofloxacin (CIPRO) IVPB 400 mg     400 mg 200 mL/hr over 60 Minutes Intravenous  Once 06/08/17 0527 06/08/17 0834   06/08/17 0530  metroNIDAZOLE (FLAGYL) IVPB 500 mg     500 mg 100 mL/hr over 60 Minutes Intravenous  Once 06/08/17 0527 06/08/17 7893     Assessment/Plan Gangrenous cholecystitis, intra-abdominal adhesions, abdominal wall hernias, Laparoscopic cholecystectomy with lysis of adhesions times 90 minutes, 06/08/17 Dr. Alphonsa Brown Lipase is elevated  - 98  Arthritis History of prostate cancer with radiation therapy. Hyperlipidemia Hypertension Remote history of tobacco use  FEN: Heart healthy diet ID: Cipro Flagyl 12/27 day 7 DVT: Heparin Foley: None Follow-up Dr. Alphonsa Brown    His wife says that the GERD symptoms are long-standing he did better with Pepcid than he did with the Prilosec at home.  He is followed by Dr. Cristina Brown.  We have him on PPI twice daily.  I will add some Carafate.  Discuss length of antibiotic treatment, encourage I-S check his O2 sats with ambulation.  Aim to discharge home the next 24-48 hrs. recheck labs in a.m.    LOS: 6 days    Julian Brown 06/14/2017 [phone]

## 2017-06-14 NOTE — Progress Notes (Addendum)
SATURATION QUALIFICATIONS: (This note is used to comply with regulatory documentation for home oxygen)  Patient Saturations on Room Air at Rest = 95%  Patient Saturations on Room Air while Ambulating = 87  Patient Saturations on 2 Liters of oxygen while resting = 98%  Please briefly explain why patient needs home oxygen:pt saturations fall into upper 80's while ambulating without oxygen.

## 2017-06-15 ENCOUNTER — Inpatient Hospital Stay (HOSPITAL_COMMUNITY): Payer: Medicare Other

## 2017-06-15 LAB — COMPREHENSIVE METABOLIC PANEL
ALT: 39 U/L (ref 17–63)
AST: 34 U/L (ref 15–41)
Albumin: 2.9 g/dL — ABNORMAL LOW (ref 3.5–5.0)
Alkaline Phosphatase: 43 U/L (ref 38–126)
Anion gap: 6 (ref 5–15)
BUN: 20 mg/dL (ref 6–20)
CO2: 28 mmol/L (ref 22–32)
Calcium: 9 mg/dL (ref 8.9–10.3)
Chloride: 99 mmol/L — ABNORMAL LOW (ref 101–111)
Creatinine, Ser: 1.01 mg/dL (ref 0.61–1.24)
GFR calc Af Amer: >60 mL/min (ref >60)
GFR calc non Af Amer: >60 mL/min (ref >60)
Glucose, Bld: 95 mg/dL (ref 65–99)
Potassium: 3.8 mmol/L (ref 3.5–5.1)
Sodium: 133 mmol/L — ABNORMAL LOW (ref 135–145)
Total Bilirubin: 0.8 mg/dL (ref 0.3–1.2)
Total Protein: 5.8 g/dL — ABNORMAL LOW (ref 6.5–8.1)

## 2017-06-15 LAB — CBC
HCT: 38 % — ABNORMAL LOW (ref 39.0–52.0)
Hemoglobin: 12.8 g/dL — ABNORMAL LOW (ref 13.0–17.0)
MCH: 33.5 pg (ref 26.0–34.0)
MCHC: 33.7 g/dL (ref 30.0–36.0)
MCV: 99.5 fL (ref 78.0–100.0)
Platelets: 347 10*3/uL (ref 150–400)
RBC: 3.82 MIL/uL — ABNORMAL LOW (ref 4.22–5.81)
RDW: 14 % (ref 11.5–15.5)
WBC: 8.5 10*3/uL (ref 4.0–10.5)

## 2017-06-15 LAB — LIPASE, BLOOD: Lipase: 118 U/L — ABNORMAL HIGH (ref 11–51)

## 2017-06-15 NOTE — Progress Notes (Signed)
7 Days Post-Op    CC: Abdominal pain  Subjective: Julian Brown looks good but is still on oxygen.  It is not recorded but she noted that when she took his oxygen off and walked him Julian Brown dropped as low as 78 for a few seconds and then came back up.  Julian Brown is up walking well.  Julian Brown is also tolerating soft diet well.  Julian Brown denies any abdominal pain, just soreness from the port sites.  Objective: Vital signs in last 24 hours: Temp:  [97.9 F (36.6 C)-98.8 F (37.1 C)] 97.9 F (36.6 C) (01/03 7858) Pulse Rate:  [73-78] 78 (01/03 0613) Resp:  [18-20] 20 (01/03 0613) BP: (120-148)/(46-67) 148/57 (01/03 0613) SpO2:  [93 %-96 %] 94 % (01/03 0613) Last BM Date: 06/14/17 720 PO 100 IV Urine 3625 Drain 15 Stool x 1 Afebrile, VSS  Sats OK on [place], but drops his saturations down to 87% walking  Labs OK except Lipase that is going up Intake/Output from previous day: 01/02 0701 - 01/03 0700 In: 830 [P.O.:720; I.V.:10; IV Piggyback:100] Out: 8502 [Urine:3625; Drains:15] Intake/Output this shift: Total I/O In: -  Out: 500 [Urine:500]  General appearance: alert, cooperative and no distress Resp: Some end expiratory wheezing. GI: Soft, sore, sites look fine.  Julian Brown sitting up but denies any significant abdominal discomfort.  Having bowel movements and tolerating diet well.  Lab Results:  Recent Labs    06/13/17 1016 06/15/17 0419  WBC 9.3 8.5  HGB 13.5 12.8*  HCT 39.7 38.0*  PLT 319 347    BMET Recent Labs    06/13/17 1016 06/15/17 0419  NA 133* 133*  K 3.6 3.8  CL 99* 99*  CO2 27 28  GLUCOSE 127* 95  BUN 18 20  CREATININE 0.83 1.01  CALCIUM 8.8* 9.0   PT/INR Recent Labs    06/13/17 1016  LABPROT 15.5*  INR 1.24    Recent Labs  Lab 06/09/17 0458 06/10/17 0512 06/13/17 1016 06/15/17 0419  AST 27 40 39 34  ALT 22 26 37 39  ALKPHOS 44 49 47 43  BILITOT 0.9 1.2 0.6 0.8  PROT 5.7* 5.7* 5.9* 5.8*  ALBUMIN 2.5* 2.5* 2.8* 2.9*     Lipase     Component Value Date/Time   LIPASE 118  (H) 06/15/2017 0419     Medications: . acetaminophen  1,000 mg Oral TID  . albuterol  2.5 mg Nebulization BID  . amLODipine  10 mg Oral Daily  . aspirin EC  81 mg Oral Daily  . bisacodyl  10 mg Rectal Daily  . ciprofloxacin  500 mg Oral BID  . feeding supplement  237 mL Oral BID BM  . fluticasone  1 spray Each Nare Daily  . fluticasone furoate-vilanterol  1 puff Inhalation Daily  . gabapentin  300 mg Oral QHS  . heparin injection (subcutaneous)  5,000 Units Subcutaneous Q8H  . irbesartan  300 mg Oral Daily   And  . hydrochlorothiazide  12.5 mg Oral Daily  . lip balm  1 application Topical BID  . pantoprazole  40 mg Oral BID  . potassium chloride  40 mEq Oral Daily  . psyllium  1 packet Oral BID  . rosuvastatin  10 mg Oral Daily  . saccharomyces boulardii  250 mg Oral BID  . sodium chloride flush  3 mL Intravenous Q12H  . sucralfate  1 g Oral TID WC & HS   . sodium chloride    . methocarbamol (ROBAXIN)  IV    .  metronidazole 500 mg (06/15/17 1700)   Anti-infectives (From admission, onward)   Start     Dose/Rate Route Frequency Ordered Stop   06/13/17 2000  ciprofloxacin (CIPRO) tablet 500 mg     500 mg Oral 2 times daily 06/13/17 0905 06/16/17 1959   06/09/17 0600  ciprofloxacin (CIPRO) IVPB 400 mg  Status:  Discontinued     400 mg 200 mL/hr over 60 Minutes Intravenous Every 12 hours 06/08/17 2007 06/13/17 0905   06/08/17 2200  metroNIDAZOLE (FLAGYL) IVPB 500 mg     500 mg 100 mL/hr over 60 Minutes Intravenous Every 8 hours 06/08/17 2007     06/08/17 1819  ciprofloxacin (CIPRO) 400 MG/200ML IVPB    Comments:  Mickley, Holly   : cabinet override      06/08/17 1819 06/08/17 1822   06/08/17 0530  ciprofloxacin (CIPRO) IVPB 400 mg     400 mg 200 mL/hr over 60 Minutes Intravenous  Once 06/08/17 0527 06/08/17 0834   06/08/17 0530  metroNIDAZOLE (FLAGYL) IVPB 500 mg     500 mg 100 mL/hr over 60 Minutes Intravenous  Once 06/08/17 0527 06/08/17 1749      Assessment/Plan Gangrenous cholecystitis, intra-abdominal adhesions, abdominal wall hernias, Laparoscopic cholecystectomy with lysis of adhesions times 90 minutes, 06/08/17 Dr. [name] Lipase is elevated  - 98 =>> 118 today WBC 8.5  Arthritis History of prostate cancer with radiation therapy. Hyperlipidemia Hypertension Remote history of tobacco use  FEN: Heart healthy diet ID: Cipro Flagyl 12/27 day 8 DVT: Heparin Foley: None Follow-up Dr. [name]  Plan: We will get a chest x-ray and may ask medicine to review because of his low O2 sats.  Julian Brown has not been on on oxygen in the past.  No complaints of GERD symptoms today.       LOS: 7 days    Julian Brown 06/15/2017 [phone]

## 2017-06-15 NOTE — Consult Note (Addendum)
Medical Consultation   Julian Brown  IFO:[id number]  DOB: 1932/09/05  DOA: 06/07/2017  PCP: Prince Solian, MD   Requesting physician: Surgery team   Reason for consultation: Hypoxia   History of Present Illness: Pt is 84 y.o. male with HTN, HLD, previous colon and prostate cancer, who presented on 06/08/2017 for evaluation of several days duration of abd pain. Pt underwent lap chole with lysis of adhesions 12/27 and is currently post op day #7. Was doing OK from surgical stand point but has remained hypoxic especially with exertion and unable to taper off oxygen. Patient currently denies chest pain, denies dyspnea at rest, also no reports of abdominal or urinary concerns.  Review of Systems:  Constitutional: Negative for fever, chills, diaphoresis, activity change, appetite change and fatigue.  HENT: Negative for ear pain, nosebleeds, congestion, facial swelling, rhinorrhea, neck pain, neck stiffness and ear discharge.   Eyes: Negative for pain, discharge, redness, itching and visual disturbance.  Respiratory: Negative for cough, choking, chest tightness, wheezing and stridor.   Cardiovascular: Negative for chest pain, palpitations and leg swelling.  Gastrointestinal: Negative for abdominal distention.  Genitourinary: Negative for dysuria, urgency, frequency, hematuria, flank pain, decreased urine volume, difficulty urinating and dyspareunia.  Musculoskeletal: Negative for back pain, joint swelling, arthralgias and gait problem.  Neurological: Negative for dizziness, tremors, seizures, syncope, facial asymmetry, speech difficulty, weakness, light-headedness, numbness and headaches.  Hematological: Negative for adenopathy. Does not bruise/bleed easily.  Psychiatric/Behavioral: Negative for hallucinations, behavioral problems, confusion, dysphoric mood, decreased concentration and agitation.   Past Medical History: Past Medical History:  Diagnosis Date  . Acute  gangrenous cholecystitis s/p lap cholecystectomy 06/08/2017 06/08/2017  . Arthritis   . ED (erectile dysfunction)   . Hyperlipemia   . Hypertension   . Prostate cancer [facility])    prostate cancer-tx radiation  . Wears glasses    Past Surgical History: Past Surgical History:  Procedure Laterality Date  . APPENDECTOMY    . CARPAL TUNNEL RELEASE  5/12   lt  . CARPAL TUNNEL RELEASE  12/13/2011   Procedure: CARPAL TUNNEL RELEASE;  Surgeon: [name]., MD;  Location: [place];  Service: Orthopedics;  Laterality: Right;  and inject right wrist  . CHOLECYSTECTOMY N/A 06/08/2017   Procedure: LAPAROSCOPIC CHOLECYSTECTOMY WITH LYSIS OF ADHESIONS;  Surgeon: [name], MD;  Location: [facility];  Service: General;  Laterality: N/A;  . [place]   hemicolectomy-rt-ca  . COLONOSCOPY     Allergies:   Allergies  Allergen Reactions  . Penicillins Swelling    Has patient had a PCN reaction causing immediate rash, facial/tongue/throat swelling, SOB or lightheadedness with hypotension: no (lip swelling "as soon as the pill touched my mouth so I didn't swallow it") Has patient had a PCN reaction causing severe rash involving mucus membranes or skin necrosis: no Has patient had a PCN reaction that required hospitalization: no Has patient had a PCN reaction occurring within the last 10 years: no If all of the above answers are "NO", then may proceed with Ceph   Social History:  reports that he quit smoking about 30 years ago. he has never used smokeless tobacco. He reports that he drinks alcohol. He reports that he does not use drugs.  Family History: Patient reports family history of hypertension on mother and father side  Physical Exam: Vitals:   06/14/17 2149 06/15/17 6767 06/15/17 1105 06/15/17 1354  BP: 139/67 (!) 148/57  (!) 122/50  Pulse: 73 78  68  Resp: 18 20  18   Temp: 98.8 F (37.1 C) 97.9 F (36.6 C)  98.1 F (36.7 C)  TempSrc: Oral Oral  Oral    SpO2: 93% 94% 91% 91%  Weight:      Height:        Constitutional: Alert and awake, oriented x3, not in any acute distress. Eyes: PERLA, EOMI, irises appear normal, anicteric sclera,  ENMT: external ears and nose appear normal. Lips appears normal, oropharynx mucosa, tongue, posterior pharynx appear normal  Neck: neck appears normal, no masses, normal ROM, no thyromegaly, no JVD  CVS: S1-S2 clear, no murmur rubs or gallops, no LE edema, normal pedal pulses  Respiratory:  Diminished breath sounds at bases, otherwise clear, no wheezing or tachypnea Abdomen: soft nontender, nondistended Musculoskeletal: : no cyanosis, clubbing or edema noted bilaterally Neuro: Cranial nerves II-XII intact, strength, sensation, reflexes Psych: judgement and insight appear normal, stable mood and affect, mental status Skin: no rashes or lesions or ulcers, no induration or nodules   Data reviewed:  I have personally reviewed following labs and imaging studies  Labs:  CBC: Recent Labs  Lab 06/09/17 0458 06/10/17 0512 06/11/17 0511 06/13/17 1016 06/15/17 0419  WBC 14.3* 11.9* 8.8 9.3 8.5  NEUTROABS 11.2* 9.0*  --  6.8  --   HGB 14.7 13.9 13.3 13.5 12.8*  HCT 43.2 42.2 40.4 39.7 38.0*  MCV 100.7* 101.7* 102.0* 99.3 99.5  PLT 231 265 272 319 433    Basic Metabolic Panel: Recent Labs  Lab 06/09/17 0458 06/10/17 0512 06/13/17 1016 06/15/17 0419  NA 133* 135 133* 133*  K 4.2 4.2 3.6 3.8  CL 100* 101 99* 99*  CO2 26 26 27 28   GLUCOSE 138* 111* 127* 95  BUN 27* 26* 18 20  CREATININE 1.14 0.97 0.83 1.01  CALCIUM 8.5* 8.4* 8.8* 9.0   Liver Function Tests: Recent Labs  Lab 06/09/17 0458 06/10/17 0512 06/13/17 1016 06/15/17 0419  AST 27 40 39 34  ALT 22 26 37 39  ALKPHOS 44 49 47 43  BILITOT 0.9 1.2 0.6 0.8  PROT 5.7* 5.7* 5.9* 5.8*  ALBUMIN 2.5* 2.5* 2.8* 2.9*   Recent Labs  Lab 06/13/17 1016 06/15/17 0419  LIPASE 98* 118*   Coagulation profile Recent Labs  Lab 06/13/17 1016   INR 1.24    Cardiac Enzymes: Recent Labs  Lab 06/12/17 1033  TROPONINI <0.03   Urinalysis    Component Value Date/Time   COLORURINE YELLOW 11/27/2012 0044   APPEARANCEUR CLEAR 11/27/2012 0044   LABSPEC 1.013 11/27/2012 0044   PHURINE 7.5 11/27/2012 0044   GLUCOSEU NEGATIVE 11/27/2012 0044   HGBUR TRACE (A) 11/27/2012 0044   BILIRUBINUR NEGATIVE 11/27/2012 0044   KETONESUR NEGATIVE 11/27/2012 0044   PROTEINUR 100 (A) 11/27/2012 0044   UROBILINOGEN 1.0 11/27/2012 0044   NITRITE NEGATIVE 11/27/2012 0044   LEUKOCYTESUR NEGATIVE 11/27/2012 0044   Inpatient Medications:   Scheduled Meds: . acetaminophen  1,000 mg Oral TID  . albuterol  2.5 mg Nebulization BID  . amLODipine  10 mg Oral Daily  . aspirin EC  81 mg Oral Daily  . bisacodyl  10 mg Rectal Daily  . ciprofloxacin  500 mg Oral BID  . feeding supplement  237 mL Oral BID BM  . fluticasone  1 spray Each Nare Daily  . fluticasone furoate-vilanterol  1 puff Inhalation Daily  . gabapentin  300 mg Oral QHS  .  heparin injection (subcutaneous)  5,000 Units Subcutaneous Q8H  . irbesartan  300 mg Oral Daily   And  . hydrochlorothiazide  12.5 mg Oral Daily  . lip balm  1 application Topical BID  . pantoprazole  40 mg Oral BID  . psyllium  1 packet Oral BID  . rosuvastatin  10 mg Oral Daily  . saccharomyces boulardii  250 mg Oral BID  . sodium chloride flush  3 mL Intravenous Q12H  . sucralfate  1 g Oral TID WC & HS   Continuous Infusions: . sodium chloride    . methocarbamol (ROBAXIN)  IV    . metronidazole Stopped (06/15/17 1521)   Radiological Exams on Admission: Dg Chest 2 View Result Date: 06/15/2017 Persistent mild bibasilar atelectasis and/or scarring again noted. Mild right base infiltrate cannot be excluded. Small right pleural effusion .  Ct Chest Wo Contrast Result Date: 06/15/2017 Stable left upper lobe nodules. No follow-up needed if patient is low-risk (and has no known or suspected primary neoplasm).  Non-contrast chest CT can be considered in 9 months if patient is high-risk.  Stable right middle lobe scarring. Mild right lower lobe atelectatic changes new from the prior exam. Stable left thyroid nodule which has been previously biopsied. Aortic Atherosclerosis (ICD10-I70.0) and Emphysema.  Impression/Recommendations Principal Problem:   Acute gangrenous cholecystitis s/p lap cholecystectomy 06/08/2017 -  Per primary team  Active Problems:   Hypoxia, also exertional - Patient maintaining oxygen saturations above 90% at rest however dropped to 80s noted with exertion - On physical exam lungs sound clear with diminished breath sounds at bases - Review of imaging studies indicate atelectasis and no clear infiltrate noted on CT chest - I have encouraged use of incentive spirometry and patient was educated on proper use - Would not initiate antibiotics at this time as no clear evidence of pneumonia on CT chest - Will ask RN to document oxygen levels with ambulation so that oxygen can be provided on discharge if needed - I expect that patient can be discharged in the morning if respiratory status remained stable    Left upper lobe nodules - Noted on CT chest, appears stable, outpatient follow-up is reasonable    Hypertension - Continue Norvasc and hydrochlorothiazide per home medical regimen    Hyperlipemia - Continue statin per home medical regimen  Thank you for this consultation.  Our [facility] hospitalist team will follow the patient with you. I have updated patient and his daughter at bedside, their questions were answered to their satisfaction. Will reevaluate patient in the morning prior to expected discharge   Time Spent: 45 minutes   Rebecca Eaton Magick-Myers M.D. [facility] 06/15/2017, 4:52 PM

## 2017-06-15 NOTE — Care Management Important Message (Signed)
Important Message  Patient Details  Name: CEFERINO LANG MRN: [id number] Date of Birth: 1933/05/20   Medicare Important Message Given:  Yes    Kerin Salen 06/15/2017, 10:10 AMImportant Message  Patient Details  Name: HARBOR VANOVER MRN: [id number] Date of Birth: September 29, 1932   Medicare Important Message Given:  Yes    Kerin Salen 06/15/2017, 10:10 AM

## 2017-06-16 DIAGNOSIS — K810 Acute cholecystitis: Secondary | ICD-10-CM

## 2017-06-16 DIAGNOSIS — R0902 Hypoxemia: Secondary | ICD-10-CM

## 2017-06-16 DIAGNOSIS — I10 Essential (primary) hypertension: Secondary | ICD-10-CM

## 2017-06-16 MED ORDER — ACETAMINOPHEN 500 MG PO TABS: ORAL_TABLET | ORAL | 0 refills | Status: DC

## 2017-06-16 MED ORDER — SUCRALFATE 1 G PO TABS: 1.0000 g | ORAL_TABLET | Freq: Three times a day (TID) | ORAL | 0 refills | Status: DC

## 2017-06-16 MED ORDER — TRAMADOL HCL 50 MG PO TABS: 50.0000 mg | ORAL_TABLET | Freq: Four times a day (QID) | ORAL | 0 refills | Status: DC | PRN

## 2017-06-16 MED ORDER — PANTOPRAZOLE SODIUM 40 MG PO TBEC: 40.0000 mg | DELAYED_RELEASE_TABLET | Freq: Two times a day (BID) | ORAL | 0 refills | Status: DC

## 2017-06-16 NOTE — Progress Notes (Signed)
Patient desaturate to 87%  to 89% on r/a and remains there.  But come up  90% to 94% with 2L  of 02 [place].  Have been monitoring patient through the night on continuous pulse ox.

## 2017-06-16 NOTE — Discharge Summary (Signed)
Physician Discharge Summary  Patient ID: Julian Brown MRN: [id number] DOB/AGE: 1932/07/27 84 y.o.  Admit date: 06/07/2017 Discharge date: 06/16/2017 Chief complaint: Abdominal pain  Admission Diagnoses:  Acute cholecystitis Hypertension hyperlipidemia History of colon cancer History of prostate cancer GERD   Discharge Diagnoses:  Gangrene is cholecystitis with intra-abdominal adhesions, abdominal wall hernia Postop exertional hypoxia History of tobacco use GERD History of prostate cancer with radiation therapy History of colon cancer with right hemicolectomy Hypertension Hyperlipidemia     Principal Problem:   Acute gangrenous cholecystitis s/p lap cholecystectomy 06/08/2017 Active Problems:   Hypertension   Hyperlipemia   PROCEDURES: Laparoscopic cholecystectomy with lysis of adhesions (90 minutes) 06/08/17, Dr. Elby Beck Course:  Patient is a 84 year old male with a past medical history start for hyperlipidemia, hypertension, previous colon cancer and prostate cancer. Patient comes in with a 4-day history of abdominal pain.  Patient was recently seen in the ER secondary to abdominal pain.  He underwent ultrasound that time which was normal.  Patient was discharged with constipation. Patient continued with abdominal pain, some nausea as well as some minimal emesis.  Patient can continued with p.o. diet with exacerbation of abdominal pain.  Patient states he developed some distention.  He does state that he has been constipated for approximately 6 days.  Patient was admitted by Dr. Rosendo Gros on 06/08/2017.  He was seen at a.m. by Dr. Lucia Gaskins and taken to the operating room that evening.  He was found to have a gangrenous cholecystitis with large number intra-abdominal adhesions requiring 90 minutes of enteral lysis.  At the completion of the procedure drain was placed and he was returned to the floor.  Postoperatively he made slow progress.  He has had some mild  shortness of breath and exertional dyspnea since his early postop course.  Second postop day.  He is placed on handheld nebulizers and we continue to monitor his oxygen work on his pulmonary toilet.  He also complained of significant reflux disease.  He was placed on Protonix for this.  This is a long-term issue.  He had some burning in his chest that became a major complaint and this was attributed to his GERD symptoms. On 06/14/17 I saw him he continued to have issues with his O2 sats dropping with ambulation and GERD symptoms.  He is followed by Dr. Cristina Gong and at this point I placed him on Protonix twice daily and Carafate.  He has had fairly good relief of his GI symptoms with this combination.  He continues to drop his sats with ambulation and we repeated a chest x-ray and got a medicine consult for evaluation.  He was seen by Deloria Lair at that point.  It was her opinion that there is atelectasis but no clear infiltrate they did not recommend antibiotics no clear evidence of pneumonia on CT.  He was continued on oxygen for ambulation.  He was seen on 06/16/16 by [employer], from the hospitalist service and he also made the same recommendation.  I walked the patient in the hall myself on 06/16/17.  His O2 sat went from 93% sitting down to 82% walking the hall without oxygen.  We have ordered home O2 and his wife is going to obtain a follow-up appointment with his primary care Dr Dagmar Hait, hopefully next week.  I have written for him to keep his O2 sats above 90% and to use 1-2 L nasal cannula.  His wife has purchased a pulse oximeter we actually used it  in the hospital it seems to work well.  They should be able to monitor him easily at home.   Because of his GERD symptoms we discharged him home on the Carafate 1 g 4 times daily along with Protonix 40 mg twice daily.  He had almost no drainage from his JP and this was removed prior to discharge.  He is tolerating soft diet and is fairly comfortable on the PPI and  Carafate combination. At this point we discharged the patient home.  Condition on discharge: Improving.  CBC Latest Ref Rng & Units 06/15/2017 06/13/2017 06/11/2017  WBC 4.0 - 10.5 K/uL 8.5 9.3 8.8  Hemoglobin 13.0 - 17.0 g/dL 12.8(L) 13.5 13.3  Hematocrit 39.0 - 52.0 % 38.0(L) 39.7 40.4  Platelets 150 - 400 K/uL 347 319 272   CMP Latest Ref Rng & Units 06/15/2017 06/13/2017 06/10/2017  Glucose 65 - 99 mg/dL 95 127(H) 111(H)  BUN 6 - 20 mg/dL 20 18 26(H)  Creatinine 0.61 - 1.24 mg/dL 1.01 0.83 0.97  Sodium 135 - 145 mmol/L 133(L) 133(L) 135  Potassium 3.5 - 5.1 mmol/L 3.8 3.6 4.2  Chloride 101 - 111 mmol/L 99(L) 99(L) 101  CO2 22 - 32 mmol/L 28 27 26   Calcium 8.9 - 10.3 mg/dL 9.0 8.8(L) 8.4(L)  Total Protein 6.5 - 8.1 g/dL 5.8(L) 5.9(L) 5.7(L)  Total Bilirubin 0.3 - 1.2 mg/dL 0.8 0.6 1.2  Alkaline Phos 38 - 126 U/L 43 47 49  AST 15 - 41 U/L 34 39 40  ALT 17 - 63 U/L 39 37 26   Disposition: 01-Home or Self Care   Allergies as of 06/16/2017      Reactions   Penicillins Swelling   Has patient had a PCN reaction causing immediate rash, facial/tongue/throat swelling, SOB or lightheadedness with hypotension: no (lip swelling "as soon as the pill touched my mouth so I didn't swallow it") Has patient had a PCN reaction causing severe rash involving mucus membranes or skin necrosis: no Has patient had a PCN reaction that required hospitalization: no Has patient had a PCN reaction occurring within the last 10 years: no If all of the above answers are "NO", then may proceed with Ceph      Medication List    STOP taking these medications   cloNIDine 0.1 MG tablet Commonly known as:  CATAPRES   sodium phosphate 7-19 GM/118ML Enem     TAKE these medications   acetaminophen 500 MG tablet Commonly known as:  TYLENOL As long as your having pain I would take the Tylenol(acetaminophen) every 8 hours.  He can buy this over-the-counter at any drugstore. Do not exceed 4000 mg of  Tylenol/acetaminophen in any 24-hour. What changed:    how much to take  how to take this  when to take this  reasons to take this  additional instructions   amLODipine 10 MG tablet Commonly known as:  NORVASC Take 10 mg by mouth daily.   aspirin EC 81 MG tablet Take 81 mg by mouth daily.   BREO ELLIPTA 100-25 MCG/INH Aepb Generic drug:  fluticasone furoate-vilanterol Inhale 1 puff into the lungs daily.   cholecalciferol 1000 units tablet Commonly known as:  VITAMIN D Take 2,000 Units by mouth daily.   fish oil-omega-3 fatty acids 1000 MG capsule Take 1 g by mouth daily.   fluticasone 50 MCG/ACT nasal spray Commonly known as:  FLONASE Place 1 spray into both nostrils daily.   glycerin adult 2 g suppository Place 1 suppository rectally  as needed for constipation.   magnesium hydroxide 400 MG/5ML suspension Commonly known as:  MILK OF MAGNESIA Take 5 mLs by mouth daily as needed for mild constipation.   olmesartan-hydrochlorothiazide 40-12.5 MG tablet Commonly known as:  BENICAR HCT Take 1 tablet by mouth daily.   pantoprazole 40 MG tablet Commonly known as:  PROTONIX Take 1 tablet (40 mg total) by mouth 2 (two) times daily.   rosuvastatin 10 MG tablet Commonly known as:  CRESTOR Take 10 mg by mouth daily.   sucralfate 1 g tablet Commonly known as:  CARAFATE Take 1 tablet (1 g total) by mouth 4 (four) times daily -  with meals and at bedtime.   traMADol 50 MG tablet Commonly known as:  ULTRAM Take 1-2 tablets (50-100 mg total) by mouth every 6 (six) hours as needed for moderate pain or severe pain.            Durable Medical Equipment  (From admission, onward)        Start     Ordered   06/16/17 1213  For home use only DME oxygen  Once    Question Answer Comment  Mode or (Route) Nasal cannula   Liters per Minute 2   Frequency Continuous (stationary and portable oxygen unit needed)   Oxygen conserving device Yes   Oxygen delivery system Gas       06/16/17 1213     Follow-up Information    [name], MD Follow up on 06/30/2017.   Specialty:  General Surgery Why:  Your appointment is at 10 AM.  Be at the office 30 minutes early for check-in.  Bring photo ID and insurance information. Contact information: [address] [place] [address] [phone]        Prince Solian, MD Follow up.   Specialty:  Internal Medicine Why:  Call and see next week and let him evaluate your lungs and other medical issues. Contact information: [address] [place] [address] [phone]           Signed: Earnstine Regal 06/16/2017, 4:24 PM

## 2017-06-16 NOTE — Progress Notes (Signed)
8 Days Post-Op    CC: Abdominal pain  Subjective He really wants to go home.  I walked him with O2 sat monitor.  He went from 93% sitting down to 82% walking.  His port sites look good he is tolerating a diet well.  His GERD symptoms seem to be better.  Objective: Vital signs in last 24 hours: Temp:  [97.8 F (36.6 C)-98.7 F (37.1 C)] 98.7 F (37.1 C) (01/04 0536) Pulse Rate:  [68-70] 70 (01/04 0536) Resp:  [16-18] 16 (01/04 0536) BP: (122-126)/(50-70) 126/70 (01/04 0536) SpO2:  [90 %-93 %] 91 % (01/04 0828) Last BM Date: 06/15/17 960 PO 500 IV Urine 2110 Drain 3  stool x 1 Afebrile, VSS No labs CT chest yesterday:  Lungs are well aerated bilaterally. Very mild right middle lobe scarring is again seen and stable. Mild right lower lobe atelectasis is seen. No sizable effusion is noted. 2 left upper lobe nodules are again seen and stable. Mild emphysematous changes are seen. Intake/Output from previous day: 01/03 0701 - 01/04 0700 In: 5102 [P.O.:960; I.V.:10; IV Piggyback:500] Out: 2113 [Urine:2110; Drains:3] Intake/Output this shift: Total I/O In: 280 [P.O.:180; IV Piggyback:100] Out: 400 [Urine:400]  General appearance: alert, cooperative and no distress Resp: clear to auscultation bilaterally and Less wheezing and rhonchi. GI: Soft, sore, port sites all look good.  He is tolerating a diet, less burning with p.o. intake.  Positive bowel sounds and bowel movement.  Lab Results:  Recent Labs    06/15/17 0419  WBC 8.5  HGB 12.8*  HCT 38.0*  PLT 347    BMET Recent Labs    06/15/17 0419  NA 133*  K 3.8  CL 99*  CO2 28  GLUCOSE 95  BUN 20  CREATININE 1.01  CALCIUM 9.0   PT/INR No results for input(s): LABPROT, INR in the last 72 hours.  Recent Labs  Lab 06/10/17 0512 06/13/17 1016 06/15/17 0419  AST 40 39 34  ALT 26 37 39  ALKPHOS 49 47 43  BILITOT 1.2 0.6 0.8  PROT 5.7* 5.9* 5.8*  ALBUMIN 2.5* 2.8* 2.9*     Lipase     Component Value  Date/Time   LIPASE 118 (H) 06/15/2017 0419     Medications: . acetaminophen  1,000 mg Oral TID  . albuterol  2.5 mg Nebulization BID  . amLODipine  10 mg Oral Daily  . aspirin EC  81 mg Oral Daily  . bisacodyl  10 mg Rectal Daily  . feeding supplement  237 mL Oral BID BM  . fluticasone  1 spray Each Nare Daily  . fluticasone furoate-vilanterol  1 puff Inhalation Daily  . heparin injection (subcutaneous)  5,000 Units Subcutaneous Q8H  . irbesartan  300 mg Oral Daily   And  . hydrochlorothiazide  12.5 mg Oral Daily  . lip balm  1 application Topical BID  . pantoprazole  40 mg Oral BID  . psyllium  1 packet Oral BID  . rosuvastatin  10 mg Oral Daily  . saccharomyces boulardii  250 mg Oral BID  . sodium chloride flush  3 mL Intravenous Q12H  . sucralfate  1 g Oral TID WC & HS    Assessment/Plan Gangrenous cholecystitis, intra-abdominal adhesions, abdominal wall hernias, Laparoscopic cholecystectomy with lysis of adhesions times 90 minutes, 06/08/17 Dr. [name] Lipase is elevated - 98 =>> 118 today WBC 8.5 Post op exertional hypoxia - evaluation per Medicine Home O2 if still dropping, follow up with PCP/Pulmonary.  No need  for antibiotics, continue IS  GERD -better on Carafate and PPI Arthritis History of prostate cancer with radiation therapy. Hyperlipidemia Hypertension Remote history of tobacco use  FEN: Heart healthy diet ID: Cipro Flagyl 12/27 day 8 DVT: Heparin Foley: None Follow-up Dr. [name]   Plan: Home today with home O2.  Ask his wife to call and arrange follow-up with Dr.AVVA next week if possible.  His wife has bought a sat monitor for home use.  We used it here in the hospital and seems to work fine.  He has follow-up appointments with Dr. Lucia Gaskins in 2 weeks.       LOS: 8 days    Lorra Freeman 06/16/2017 [phone]

## 2017-06-16 NOTE — Progress Notes (Signed)
Patient ID: Julian Brown, male   DOB: [birth year], 84 y.o.   MRN: [id number]  PROGRESS NOTE    Julian Brown  [facility]:[id number] DOB: January 25, 1933 DOA: 06/07/2017 PCP: Prince Solian, MD   Brief Narrative:  84 year old male with history of hypertension, hyperlipidemia, previous colon and prostate cancer presented on 06/08/2017 for evaluation of several days duration of abdominal pain.  Patient underwent lap cholecystectomy with lysis of adhesions on 06/08/2017.  Hospitalist service was asked to evaluate the patient on 06/15/2017 for hypoxia.   Assessment & Plan:   Principal Problem:   Acute gangrenous cholecystitis s/p lap cholecystectomy 06/08/2017 Active Problems:   Hypertension   Hyperlipemia    Hypoxia -Questionable cause.  Mostly exertional.  Currently on room air at rest.  CT of the chest did not show any infiltrates.  No need for any further antibiotics -Currently not tachypneic. -Continue incentive spirometry.  Patient might need home oxygen on discharge if his  ambulatory oxygen saturation drops down to the 80s; this can be arranged by the primary team. -Patient might benefit from outpatient pulmonary evaluation. -Patient is stable for discharge from medical point of view.  Discharge planning as per the primary team   Acute gangrenous cholecystitis s/p lap cholecystectomy 06/08/2017 -  Per primary team    Left upper lobe nodules - Noted on CT chest, appears stable, outpatient follow-up is reasonable    Hypertension - Stable.  Continue home regimen on discharge    Hyperlipemia - Continue statin per home medical regimen   Subjective: Patient seen and examined at bedside.  He feels fine and wants to go home.  Currently not short of breath.  No overnight fever or vomiting.  Objective: Vitals:   06/15/17 2147 06/15/17 2201 06/16/17 0536 06/16/17 0828  BP:  124/60 126/70   Pulse:  68 70   Resp:  17 16   Temp:  97.8 F (36.6 C) 98.7 F (37.1 C)   TempSrc:  Oral  Oral   SpO2: 93% 90% 90% 91%  Weight:      Height:        Intake/Output Summary (Last 24 hours) at 06/16/2017 0936 Last data filed at 06/16/2017 6629 Gross per 24 hour  Intake 1050 ml  Output 2013 ml  Net -963 ml   Filed Weights   06/08/17 1514  Weight: 104.3 kg (230 lb)    Examination:  General exam: Appears calm and comfortable  Respiratory system: Bilateral decreased breath sound at bases Cardiovascular system: S1 & S2 heard, rate controlled  gastrointestinal system: Abdomen is nondistended, soft and nontender. Extremities: No cyanosis, clubbing, edema   Data Reviewed: I have personally reviewed following labs and imaging studies  CBC: Recent Labs  Lab 06/10/17 0512 06/11/17 0511 06/13/17 1016 06/15/17 0419  WBC 11.9* 8.8 9.3 8.5  NEUTROABS 9.0*  --  6.8  --   HGB 13.9 13.3 13.5 12.8*  HCT 42.2 40.4 39.7 38.0*  MCV 101.7* 102.0* 99.3 99.5  PLT 265 272 319 476   Basic Metabolic Panel: Recent Labs  Lab 06/10/17 0512 06/13/17 1016 06/15/17 0419  NA 135 133* 133*  K 4.2 3.6 3.8  CL 101 99* 99*  CO2 26 27 28   GLUCOSE 111* 127* 95  BUN 26* 18 20  CREATININE 0.97 0.83 1.01  CALCIUM 8.4* 8.8* 9.0   GFR: Estimated Creatinine Clearance: 69.3 mL/min (by C-G formula based on SCr of 1.01 mg/dL). Liver Function Tests: Recent Labs  Lab 06/10/17 [phone] 06/13/17 1016 06/15/17 0419  AST 40 39 34  ALT 26 37 39  ALKPHOS 49 47 43  BILITOT 1.2 0.6 0.8  PROT 5.7* 5.9* 5.8*  ALBUMIN 2.5* 2.8* 2.9*   Recent Labs  Lab 06/13/17 1016 06/15/17 0419  LIPASE 98* 118*   No results for input(s): AMMONIA in the last 168 hours. Coagulation Profile: Recent Labs  Lab 06/13/17 1016  INR 1.24   Cardiac Enzymes: Recent Labs  Lab 06/12/17 1033  TROPONINI <0.03   BNP (last 3 results) No results for input(s): PROBNP in the last 8760 hours. HbA1C: No results for input(s): HGBA1C in the last 72 hours. CBG: No results for input(s): GLUCAP in the last 168 hours. Lipid  Profile: No results for input(s): CHOL, HDL, LDLCALC, TRIG, CHOLHDL, LDLDIRECT in the last 72 hours. Thyroid Function Tests: No results for input(s): TSH, T4TOTAL, FREET4, T3FREE, THYROIDAB in the last 72 hours. Anemia Panel: No results for input(s): VITAMINB12, FOLATE, FERRITIN, TIBC, IRON, RETICCTPCT in the last 72 hours. Sepsis Labs: No results for input(s): PROCALCITON, LATICACIDVEN in the last 168 hours.  No results found for this or any previous visit (from the past 240 hour(s)).       Radiology Studies: Dg Chest 2 View  Result Date: 06/15/2017 CLINICAL DATA:  Hypertension. Smoking history. Persistent cough. Shortness of breath. EXAM: CHEST  2 VIEW COMPARISON:  06/10/2017, 06/05/2017, 10/27/2010. FINDINGS: Mediastinum and hilar structures are normal. Stable cardiomegaly. Persistent mild bibasilar atelectasis and/or scarring again noted. Mild right base infiltrate cannot be excluded. Small right pleural effusion . IMPRESSION: Persistent mild bibasilar atelectasis and/or scarring again noted. Mild right base infiltrate cannot be excluded. Small right pleural effusion . Electronically Signed   By: Marcello Moores  Register   On: 06/15/2017 12:22   Ct Chest Wo Contrast  Result Date: 06/15/2017 CLINICAL DATA:  Hypoxia without supplemental oxygen EXAM: CT CHEST WITHOUT CONTRAST TECHNIQUE: Multidetector CT imaging of the chest was performed following the standard protocol without IV contrast. COMPARISON:  Plain film from earlier in the same day. FINDINGS: Cardiovascular: Somewhat limited by lack of IV contrast. Aortic calcifications are seen without aneurysmal dilatation. No cardiac enlargement is noted. Coronary calcifications are seen. Mediastinum/Nodes: Thoracic inlet demonstrates heterogeneity within the left lobe of the thyroid with an apparent 2.2 cm hypodense lesion. This has been previously biopsied. No hilar or mediastinal adenopathy is noted. The esophagus is within normal limits. Lungs/Pleura:  Lungs are well aerated bilaterally. Very mild right middle lobe scarring is again seen and stable. Mild right lower lobe atelectasis is seen. No sizable effusion is noted. 2 left upper lobe nodules are again seen and stable. Mild emphysematous changes are seen. Upper Abdomen: Visualized upper abdomen shows multiple cystic lesions within the liver stable from the previous exam. No other focal abnormality is noted. Musculoskeletal: Degenerative changes of the thoracic spine are seen. IMPRESSION: Stable left upper lobe nodules. No follow-up needed if patient is low-risk (and has no known or suspected primary neoplasm). Non-contrast chest CT can be considered in 9 months if patient is high-risk. This recommendation follows the consensus statement: Guidelines for Management of Incidental Pulmonary Nodules Detected on CT Images: From the Fleischner Society 2017; Radiology 2017; 284:228-243. Stable right middle lobe scarring. Mild right lower lobe atelectatic changes new from the prior exam. Stable left thyroid nodule which has been previously biopsied. Aortic Atherosclerosis (ICD10-I70.0) and Emphysema (ICD10-J43.9). Electronically Signed   By: Inez Catalina M.D.   On: 06/15/2017 16:14        Scheduled Meds: . acetaminophen  1,000 mg Oral TID  . albuterol  2.5 mg Nebulization BID  . amLODipine  10 mg Oral Daily  . aspirin EC  81 mg Oral Daily  . bisacodyl  10 mg Rectal Daily  . feeding supplement  237 mL Oral BID BM  . fluticasone  1 spray Each Nare Daily  . fluticasone furoate-vilanterol  1 puff Inhalation Daily  . heparin injection (subcutaneous)  5,000 Units Subcutaneous Q8H  . irbesartan  300 mg Oral Daily   And  . hydrochlorothiazide  12.5 mg Oral Daily  . lip balm  1 application Topical BID  . pantoprazole  40 mg Oral BID  . psyllium  1 packet Oral BID  . rosuvastatin  10 mg Oral Daily  . saccharomyces boulardii  250 mg Oral BID  . sodium chloride flush  3 mL Intravenous Q12H  . sucralfate   1 g Oral TID WC & HS   Continuous Infusions: . sodium chloride Stopped (06/15/17 1900)  . methocarbamol (ROBAXIN)  IV    . metronidazole Stopped (06/16/17 5379)     LOS: 8 days        Aline August, MD [facility] Pager [phone]  If 7PM-7AM, please contact night-coverage [email or web address] Password [facility] 06/16/2017, 9:36 AM

## 2017-06-16 NOTE — Progress Notes (Signed)
JP drain out per verbal order from PA. Discharge instructions were given to patient and prescriptions were given to patient. All questions were answered and patient was taken to main entrance via wheelchair.

## 2017-06-16 NOTE — Discharge Instructions (Signed)
[facility] ______[facility] °LAPAROSCOPIC SURGERY: POST OP INSTRUCTIONS °Always review your discharge instruction sheet given to you by the facility where your surgery was performed. °IF YOU HAVE DISABILITY OR FAMILY LEAVE FORMS, YOU MUST BRING THEM TO THE OFFICE FOR PROCESSING.   °DO NOT GIVE THEM TO YOUR DOCTOR. ° °1. A prescription for pain medication may be given to you upon discharge.  Take your pain medication as prescribed, if needed.  If narcotic pain medicine is not needed, then you may take acetaminophen (Tylenol) or ibuprofen (Advil) as needed. °2. Take your usually prescribed medications unless otherwise directed. °3. If you need a refill on your pain medication, please contact your pharmacy.  They will contact our office to request authorization. Prescriptions will not be filled after 5pm or on week-ends. °4. You should follow a light diet the first few days after arrival home, such as soup and crackers, etc.  Be sure to include lots of fluids daily. °5. Most patients will experience some swelling and bruising in the area of the incisions.  Ice packs will help.  Swelling and bruising can take several days to resolve.  °6. It is common to experience some constipation if taking pain medication after surgery.  Increasing fluid intake and taking a stool softener (such as Colace) will usually help or prevent this problem from occurring.  A mild laxative (Milk of Magnesia or Miralax) should be taken according to package instructions if there are no bowel movements after 48 hours. °7. Unless discharge instructions indicate otherwise, you may remove your bandages 24-48 hours after surgery, and you may shower at that time.  You may have steri-strips (small skin tapes) in place directly over the incision.  These strips should be left on the skin for 7-10 days.  If your surgeon used skin glue on the incision, you may shower in 24 hours.  The glue will flake off over the next 2-3 weeks.  Any sutures or  staples will be removed at the office during your follow-up visit. °8. ACTIVITIES:  You may resume regular (light) daily activities beginning the next day--such as daily self-care, walking, climbing stairs--gradually increasing activities as tolerated.  You may have sexual intercourse when it is comfortable.  Refrain from any heavy lifting or straining until approved by your doctor. °a. You may drive when you are no longer taking prescription pain medication, you can comfortably wear a seatbelt, and you can safely maneuver your car and apply brakes. °b. RETURN TO WORK:  __________________________________________________________ °9. You should see your doctor in the office for a follow-up appointment approximately 2-3 weeks after your surgery.  Make sure that you call for this appointment within a day or two after you arrive home to insure a convenient appointment time. °10. OTHER INSTRUCTIONS: __________________________________________________________________________________________________________________________ __________________________________________________________________________________________________________________________ °WHEN TO CALL YOUR DOCTOR: °1. Fever over 101.0 °2. Inability to urinate °3. Continued bleeding from incision. °4. Increased pain, redness, or drainage from the incision. °5. Increasing abdominal pain ° °The clinic staff is available to answer your questions during regular business hours.  Please don’t hesitate to call and ask to speak to one of the nurses for clinical concerns.  If you have a medical emergency, go to the nearest emergency room or call 911.  A surgeon from [facility] is always on call at the hospital. °[address], [address], [place], [place]  [address] ? [address] [address], [place],    [address] °[phone] ? [phone] ? FAX [phone] °Web site:   [email or web address]   Incentive Spirometer  An incentive spirometer is a  device used to help you keep your lungs healthy after surgery or when your activity is limited in the hospital. The incentive spirometer teaches you how to take slow deep breaths.  After surgery, it may be too painful to take deep breaths. You may also feel too weak to take deep breaths. When you do not breathe deeply enough, your lungs do not completely expand, which may lead to pneumonia.  How to Use an Incentive Spirometer *   Place the mouthpiece of the incentive spirometer in your mouth. Make sure you make a good seal over the mouthpiece with your lips.  Do not block the mouthpiece with your tongue. *   Breathe out (exhale) normally.  Breathe in (inhale) SLOWLY.  A piece in the incentive spirometer will rise as you breathe in.  Try to get this piece to rise as high as you can. *   Usually, there is a marker placed by your doctor or nurse that tells you how big of a breath you should take. *   Another smaller piece in the incentive spirometer looks like a ball or a disc.  Your goal should be to make sure this ball stays in the middle of the chamber while you breathe in. *   If you breathe in too fast, the ball will shoot to the top.  If you breathe in too slowly, the ball will stay at the bottom. *   Hold your breath for 3 to 5 seconds. Then slowly exhale. *   Take 10 to 15 breaths with your spirometer every 1 to 2 hours, or as often as instructed by your nurse or doctor. Other Tips *   If you have a surgical incision in your chest or abdomen, you may need to hold a pillow tightly to your belly while breathing in. This will help ease discomfort. *   If you do not make the number marked for you, do not get discouraged. This will improve with practice and as your body heals. *   If you start to feel dizzy or light-headed, remove the mouthpiece from your mouth and take some normal breaths. Then continue using the incentive spirometer. *   In addition to the incentive spirometer, you can help keep your  lungs healthy by turning frequently in bed, coughing, and taking deep breaths.  No lifting over 10 pounds for 6 weeks.   Home Oxygen Use, Adult  Your oxygen should be at 1-2 liters per nasal cannula while walking.  We don't want your saturations below 90%.     When a medical condition keeps you from getting enough oxygen, your health care provider may instruct you to take extra oxygen at home. Your health care provider will let you know:  When to take oxygen.  For how long to take oxygen.  How quickly oxygen should be delivered (flow rate), in liters per minute (LPM or L/M).  Home oxygen can be given through:  A mask.  A nasal cannula. This is a device or tube that goes in the nostrils.  A transtracheal catheter. This is a small, flexible tube placed in the trachea.  A tracheostomy. This is a surgically made opening in the trachea.  These devices are connected with tubing to an oxygen source, such as:  A tank. Tanks hold oxygen in gas form. They must be replaced when the oxygen is used up.  A liquid oxygen device.  This holds oxygen in liquid form. It must be replaced when the oxygen is used up.  An oxygen concentrator machine. This filters oxygen in the room. It uses electricity, so you must have a backup cylinder of oxygen in case the power goes out.  Supplies needed: To use oxygen, you will need:  A mask, nasal cannula, transtracheal catheter, or tracheostomy.  An oxygen tank, a liquid oxygen device, or an oxygen concentrator.  The tape that your health care provider recommends (optional).  If you use a transtracheal catheter and your prescribed flow rate is 1 LPM or greater, you will also need a humidifier. Risks and complications  Fire. This can happen if the oxygen is exposed to a heat source, flame, or spark.  Injury to skin. This can happen if liquid oxygen touches your skin.  Organ damage. This can happen if you get too little oxygen. How to use oxygen Your  health care provider will show you how to use your oxygen device. Follow her or his instructions. They may look something like this: 1. Wash your hands. 2. If you use an oxygen concentrator, make sure it is plugged in. 3. Place one end of the tube into the port on the tank, device, or machine. 4. Place the mask over your nose and mouth. Or, place the nasal cannula and secure it with tape if instructed. If you use a tracheostomy or transtracheal catheter, connect it to the oxygen source as directed. 5. Make sure the liter-flow setting on the machine is at the level prescribed by your health care provider. 6. Turn on the machine or adjust the knob on the tank or device to the correct liter-flow setting. 7. When you are done, turn off and unplug the machine, or turn the knob to OFF.  How to clean and care for the oxygen supplies Nasal cannula  Clean it with a warm, wet cloth daily or as needed.  Wash it with a liquid soap once a week.  Rinse it thoroughly once or twice a week.  Replace it every 2-4 weeks.  If you have an infection, such as a cold or pneumonia, change the cannula when you get better. Mask  Replace it every 2-4 weeks.  If you have an infection, such as a cold or pneumonia, change the mask when you get better. Humidifier bottle  Wash the bottle between each refill: ? Wash it with soap and warm water. ? Rinse it thoroughly. ? Disinfect it and its top. ? Air-dry it.  Make sure it is dry before you refill it. Oxygen concentrator  Clean the air filter at least twice a week according to directions from your home medical equipment and service company.  Wipe down the cabinet every day. To do this: ? Unplug the unit. ? Wipe down the cabinet with a damp cloth. ? Dry the cabinet. Other equipment  Change any extra tubing every 1-3 months.  Follow instructions from your health care provider about taking care of any other equipment. Safety tips Fire safety  tips   Keep your oxygen and oxygen supplies at least 5 ft away from sources of heat, flames, and sparks at all times.  Do not allow smoking near your oxygen. Put up "no smoking" signs in your home.  Do not use materials that can burn (are flammable) while you use oxygen.  When you go to a restaurant with portable oxygen, ask to be seated in the nonsmoking section.  Keep a Data processing manager close by.  Let your fire department know that you have oxygen in your home.  Test your home smoke detectors regularly. General safety tips  If you use an oxygen cylinder, make sure it is in a stand or secured to an object that will not move (fixed object).  If you use liquid oxygen, make sure its container is kept upright.  If you use an oxygen concentrator: ? [employer] movement psychotherapist company. Make sure you are given priority service in the event that your power goes out. ? Avoid using extension cords, if possible. Follow these instructions at home:  Use oxygen only as told by your health care provider.  Do not use alcohol or other drugs that make you relax (sedating drugs) unless instructed. They can slow down your breathing rate and make it hard to get in enough oxygen.  Know how and when to order a refill of oxygen.  Always keep a spare tank of oxygen. Plan ahead for holidays when you may not be able to get a prescription filled.  Use water-based lubricants on your lips or nostrils. Do not use oil-based products like petroleum jelly.  To prevent skin irritation on your cheeks or behind your ears, tuck some gauze under the tubing. Contact a health care provider if:  You get headaches often.  You have shortness of breath.  You have a lasting cough.  You have anxiety.  You are sleepy all the time.  You develop an illness that affects your breathing.  You cannot exercise at your regular level.  You are restless.  You have difficult or irregular breathing, and it is getting  worse.  You have a fever.  You have persistent redness under your nose. Get help right away if:  You are confused.  You have blue lips or fingernails.  You are struggling to breathe. This information is not intended to replace advice given to you by your health care provider. Make sure you discuss any questions you have with your health care provider. Document Released: 08/20/2003 Document Revised: 01/27/2016 Document Reviewed: 12/22/2015 Elsevier Interactive Patient Education  [employer].

## 2017-06-16 NOTE — Progress Notes (Signed)
SATURATION QUALIFICATIONS: (This note is used to comply with regulatory documentation for home oxygen)  Patient Saturations on Room Air at Rest = 94%  Patient Saturations on Room Air while Ambulating = 82%  Patient Saturations on 2 Liters of oxygen while Ambulating = 92%  Please briefly explain why patient needs home oxygen: patient has hypoxia when ambulating on room air sat drops to 82% when ambulating on room air

## 2017-10-25 ENCOUNTER — Ambulatory Visit (INDEPENDENT_AMBULATORY_CARE_PROVIDER_SITE_OTHER): Payer: Medicare Other | Admitting: Internal Medicine

## 2017-10-25 ENCOUNTER — Encounter: Payer: Self-pay | Admitting: Internal Medicine

## 2017-10-25 VITALS — BP 126/78 | HR 63 | Ht 71.0 in | Wt 227.2 lb

## 2017-10-25 DIAGNOSIS — R490 Dysphonia: Secondary | ICD-10-CM | POA: Diagnosis not present

## 2017-10-25 DIAGNOSIS — R911 Solitary pulmonary nodule: Secondary | ICD-10-CM

## 2017-10-25 DIAGNOSIS — R0602 Shortness of breath: Secondary | ICD-10-CM | POA: Diagnosis not present

## 2017-10-25 DIAGNOSIS — J439 Emphysema, unspecified: Secondary | ICD-10-CM | POA: Diagnosis not present

## 2017-10-25 DIAGNOSIS — R05 Cough: Secondary | ICD-10-CM | POA: Diagnosis not present

## 2017-10-25 DIAGNOSIS — R053 Chronic cough: Secondary | ICD-10-CM

## 2017-10-25 DIAGNOSIS — R918 Other nonspecific abnormal finding of lung field: Secondary | ICD-10-CM | POA: Diagnosis not present

## 2017-10-25 MED ORDER — TIOTROPIUM BROMIDE MONOHYDRATE 1.25 MCG/ACT IN AERS: 2.0000 | INHALATION_SPRAY | Freq: Every day | RESPIRATORY_TRACT | 0 refills | Status: DC

## 2017-10-25 NOTE — Patient Instructions (Signed)
Hoarseness of voice  - per Dr Wilburn Cornelia and Buccinni but please stop fish oil  Pulmonary emphysema, unspecified emphysema type ([place]) Shortness of breath Chronic cough  - some of your symptoms related to emphysema - start spiriva respimat daily  - albuterol as needed - do full PFT in 3-4 weeks  - you do need o2 with exertion but will hold off for now and will decide this during retesting - So will do simple walk test on room air at followuup (not 13mwd) at time of followup     Multiple lung nodules on CT - do CT chest without contrast Nov/dec 2019 as followup from Jan 2019  Followup 3-4 weeks with APP or myself but after PFT

## 2017-10-25 NOTE — Progress Notes (Signed)
Subjective:     Patient ID: Julian Brown, male   DOB: 22-Apr-1933, 84 y.o.   MRN: [id number]  PCP Prince Solian, MD  HPI   IOV 10/25/2017  Chief Complaint  Patient presents with  . Consult    Referred by Dr, Steva Ready Avva due to breathing. Pt has complaints of hoarseness for quite some time maybe two to three years. Pt becomes SOB when going upstairs or if does any exertion, cough with clear muucs.   Julian Brown is a 84 year old former smoker.  Referred by Dr. Dagmar Hait for evaluation of cough and shortness of breath.  Patient presents with his wife and history is gained from review of the past medical records and talking to the patient and wife.  Wife gives most of the history.  Patient defers to the wife.  As best as I can gather his most significant problem is hoarseness of voice.  This is chronic.  It has been there for at least a few years and more prominent in the last year and a half and worse after a cholecystectomy in January 2019 which was emergent and gangrenous.  The last few months he seen Dr. Wilburn Cornelia who apparently thought that the vocal cords looked inflamed.  Patient was referred to Dr. Cristina Gong GI and is on double amount of Protonix.  Medication review associated with the symptom shows that he is on fish oil.  He has been on double Protonix for the last few weeks and so far overall he has not noticed any improvement in his hoarseness of voice.  He also has chronic shortness of breath and cough.  Shortness of breath is been going on for at least a year and so is the cough.  They are unable to give me specific details but definitely both symptoms are worse after the emergent cholecystectomy in January 2019.  At that the postoperative phase in the hospital he did desaturate with exertion and has been using nighttime oxygen since then.  He is somewhat reluctant to take portable oxygen.  Today when we walked him he did desaturate with exertion and was corrected by 2 L of oxygen.   He did have a CT scan of the chest back in January that I personally visualized and it does show emphysema.  There is also aortic atherosclerosis but they did show me a report from [facility] in October 2017 that shows he had a normal cardiac stress test.  He has had previous pneumonia in August 2016 and the left lower lobe according to the wife.  His most recent chest x-ray with Dr. Dagmar Hait was in February 2019 that I have not visualized.  Of note he has some new findings of 3 mm left lobe and 4 mm left upper lobe nodule on the CT scan of the chest from January 2019.    Simple office walk 185 feet x  3 laps goal with forehead probe 10/25/2017   O2 used Room air  Number laps completed 2 and desaturated  Comments about pace Normal with limp  Resting Pulse Ox/HR 96% and 63/min  Final Pulse Ox/HR 84% and 81/min  Desaturated </= 88% yes  Desaturated <= 3% points yes  Got Tachycardic >/= 90/min no  Symptoms at end of test No complaints  Miscellaneous comments Corrected with 2L O2     IMPRESSION: Mediastinum/Nodes: Thoracic inlet demonstrates heterogeneity within the left lobe of the thyroid with an apparent 2.2 cm hypodense lesion. This has been previously biopsied. No hilar or  mediastinal adenopathy is noted. The esophagus is within normal limits.  Lungs/Pleura: Lungs are well aerated bilaterally. Very mild right middle lobe scarring is again seen and stable. Mild right lower lobe atelectasis is seen. No sizable effusion is noted. 2 left upper lobe nodules are again seen and stable. Mild emphysematous changes are seen.   Stable left upper lobe nodules. No follow-up needed if patient is low-risk (and has no known or suspected primary neoplasm). Non-contrast chest CT can be considered in 9 months if patient is high-risk. This recommendation follows the consensus statement: Guidelines for Management of Incidental Pulmonary Nodules Detected on CT Images: From the Fleischner Society  2017; Radiology 2017; 284:228-243.  Stable right middle lobe scarring.  Mild right lower lobe atelectatic changes new from the prior exam.  Stable left thyroid nodule which has been previously biopsied.  Aortic Atherosclerosis (ICD10-I70.0) and Emphysema (ICD10-J43.9).   Electronically Signed   By: Inez Catalina M.D.   On: 06/15/2017 16:14    Recent LABS  Results for Julian Brown, Julian Brown (MRN [id number]) as of 10/25/2017 10:09  Ref. Range 06/15/2017 04:19  Creatinine Latest Ref Range: 0.61 - 1.24 mg/dL 1.01  Results for Julian Brown, Julian Brown (MRN [id number]) as of 10/25/2017 10:09  Ref. Range 06/15/2017 04:19  Hemoglobin Latest Ref Range: 13.0 - 17.0 g/dL 12.8 (L)    has a past medical history of Acute gangrenous cholecystitis s/p lap cholecystectomy 06/08/2017 (06/08/2017), Arthritis, ED (erectile dysfunction), Hyperlipemia, Hypertension, Prostate cancer ([place]), and Wears glasses.   reports that he quit smoking about 30 years ago. His smoking use included cigarettes. He has a 36.00 pack-year smoking history. He has never used smokeless tobacco.  Past Surgical History:  Procedure Laterality Date  . APPENDECTOMY    . CARPAL TUNNEL RELEASE  5/12   lt  . CARPAL TUNNEL RELEASE  12/13/2011   Procedure: CARPAL TUNNEL RELEASE;  Surgeon: [name]., MD;  Location: [facility];  Service: Orthopedics;  Laterality: Right;  and inject right wrist  . CHOLECYSTECTOMY N/A 06/08/2017   Procedure: LAPAROSCOPIC CHOLECYSTECTOMY WITH LYSIS OF ADHESIONS;  Surgeon: [name], MD;  Location: [facility];  Service: General;  Laterality: N/A;  . [place]   hemicolectomy-rt-ca  . COLONOSCOPY      Allergies  Allergen Reactions  . Penicillins Swelling    Has patient had a PCN reaction causing immediate rash, facial/tongue/throat swelling, SOB or lightheadedness with hypotension: no (lip swelling "as soon as the pill touched my mouth so I didn't swallow it") Has patient had a  PCN reaction causing severe rash involving mucus membranes or skin necrosis: no Has patient had a PCN reaction that required hospitalization: no Has patient had a PCN reaction occurring within the last 10 years: no If all of the above answers are "NO", then may proceed with Ceph    Immunization History  Administered Date(s) Administered  . Influenza, High Dose Seasonal PF 02/11/2017    No family history on file.   Current Outpatient Medications:  .  acetaminophen (TYLENOL) 500 MG tablet, As long as your having pain I would take the Tylenol(acetaminophen) every 8 hours.  He can buy this over-the-counter at any drugstore. Do not exceed 4000 mg of Tylenol/acetaminophen in any 24-hour., Disp: 30 tablet, Rfl: 0 .  amLODipine (NORVASC) 10 MG tablet, Take 10 mg by mouth daily., Disp: , Rfl:  .  aspirin EC 81 MG tablet, Take 81 mg by mouth daily., Disp: , Rfl:  .  cholecalciferol (  VITAMIN D) 1000 UNITS tablet, Take 2,000 Units by mouth daily. , Disp: , Rfl:  .  fish oil-omega-3 fatty acids 1000 MG capsule, Take 1 g by mouth daily. , Disp: , Rfl:  .  hydrochlorothiazide (MICROZIDE) 12.5 MG capsule, , Disp: , Rfl:  .  Multiple Vitamins-Minerals (PRESERVISION AREDS 2) CAPS, Take by mouth., Disp: , Rfl:  .  olmesartan-hydrochlorothiazide (BENICAR HCT) 40-12.5 MG tablet, Take 1 tablet by mouth daily., Disp: , Rfl:  .  rosuvastatin (CRESTOR) 10 MG tablet, Take 10 mg by mouth daily., Disp: , Rfl:  .  sucralfate (CARAFATE) 1 g tablet, Take 1 tablet (1 g total) by mouth 4 (four) times daily -  with meals and at bedtime., Disp: 120 tablet, Rfl: 0   Review of Systems     Objective:   Physical Exam  Constitutional: He is oriented to person, place, and time. He appears well-developed and well-nourished. No distress.  HENT:  Head: Normocephalic and atraumatic.  Right Ear: External ear normal.  Left Ear: External ear normal.  Mouth/Throat: Oropharynx is clear and moist. No oropharyngeal exudate.   Hoarseness of voice  Eyes: Pupils are equal, round, and reactive to light. Conjunctivae and EOM are normal. Right eye exhibits no discharge. Left eye exhibits no discharge. No scleral icterus.  Neck: Normal range of motion. Neck supple. No JVD present. No tracheal deviation present. No thyromegaly present.  Cardiovascular: Normal rate, regular rhythm and intact distal pulses. Exam reveals no gallop and no friction rub.  No murmur heard. Pulmonary/Chest: Effort normal and breath sounds normal. No respiratory distress. He has no wheezes. He has no rales. He exhibits no tenderness.  Abdominal: Soft. Bowel sounds are normal. He exhibits no distension and no mass. There is no tenderness. There is no rebound and no guarding.  Visceral obesity +  Musculoskeletal: Normal range of motion. He exhibits no edema or tenderness.  Lymphadenopathy:    He has no cervical adenopathy.  Neurological: He is alert and oriented to person, place, and time. He has normal reflexes. No cranial nerve deficit. Coordination normal.  Skin: Skin is warm and dry. No rash noted. He is not diaphoretic. No erythema. No pallor.  Psychiatric: He has a normal mood and affect. His behavior is normal. Judgment and thought content normal.  Nursing note and vitals reviewed.  Vitals:   10/25/17 1011  BP: 126/78  Pulse: 63  SpO2: 93%  Weight: 227 lb 3.2 oz (103.1 kg)  Height: 5\' 11"  (1.803 m)    Estimated body mass index is 31.69 kg/m as calculated from the following:   Height as of this encounter: 5\' 11"  (1.803 m).   Weight as of this encounter: 227 lb 3.2 oz (103.1 kg).       Assessment:       ICD-10-CM   1. Hoarseness of voice R49.0   2. Pulmonary emphysema, unspecified emphysema type ([place]) J43.9   3. Shortness of breath R06.02   4. Chronic cough R05   5. Multiple lung nodules on CT R91.8        Plan:     Hoarseness of voice  - per Dr Wilburn Cornelia and Buccinni but please stop fish oil  Pulmonary emphysema,  unspecified emphysema type ([place]) Shortness of breath Chronic cough  - some of your symptoms related to emphysema - start spiriva respimat daily  - albuterol as needed - do full PFT in 3-4 weeks  - you do need o2 with exertion but will hold off for now  and will decide this during retesting - So will do simple walk test on room air at followuup (not 58mwd) at time of followup     Multiple lung nodules on CT - do CT chest without contrast Nov/dec 2019 as followup from Jan 2019  Followup 3-4 weeks with APP or myself but after PFT   Dr. Brand Males, M.D., [facility].C.P Pulmonary and Critical Care Medicine Staff Physician, [place] Director - Interstitial Lung Disease  Program  Pulmonary [place] at [place], [place], [address]  Pager: [phone], If no answer or between  15:00h - 7:00h: call [phone] Telephone: [phone]

## 2017-10-25 NOTE — Progress Notes (Signed)
   Subjective:    Patient ID: Julian Brown, male    DOB: 27-Feb-1933, 84 y.o.   MRN: [id number]  HPI    Review of Systems  Constitutional: Negative for fever and unexpected weight change.  HENT: Positive for postnasal drip and sneezing. Negative for congestion, dental problem, ear pain, nosebleeds, rhinorrhea, sinus pressure, sore throat and trouble swallowing.   Eyes: Negative for redness and itching.  Respiratory: Positive for cough, shortness of breath and wheezing. Negative for chest tightness.   Cardiovascular: Negative for palpitations and leg swelling.  Gastrointestinal: Negative for nausea and vomiting.  Genitourinary: Negative for dysuria.  Musculoskeletal: Positive for joint swelling.  Skin: Negative for rash.  Allergic/Immunologic: Negative.  Negative for environmental allergies, food allergies and immunocompromised state.  Neurological: Negative for headaches.  Hematological: Does not bruise/bleed easily.  Psychiatric/Behavioral: Negative for dysphoric mood. The patient is not nervous/anxious.        Objective:   Physical Exam        Assessment & Plan:

## 2017-11-20 ENCOUNTER — Ambulatory Visit (INDEPENDENT_AMBULATORY_CARE_PROVIDER_SITE_OTHER): Payer: Medicare Other | Admitting: Internal Medicine

## 2017-11-20 ENCOUNTER — Other Ambulatory Visit: Payer: Self-pay | Admitting: Internal Medicine

## 2017-11-20 ENCOUNTER — Encounter: Payer: Self-pay | Admitting: Internal Medicine

## 2017-11-20 VITALS — BP 132/78 | HR 61 | Ht 69.0 in | Wt 229.0 lb

## 2017-11-20 DIAGNOSIS — R918 Other nonspecific abnormal finding of lung field: Secondary | ICD-10-CM | POA: Diagnosis not present

## 2017-11-20 DIAGNOSIS — J449 Chronic obstructive pulmonary disease, unspecified: Secondary | ICD-10-CM | POA: Diagnosis not present

## 2017-11-20 DIAGNOSIS — J439 Emphysema, unspecified: Secondary | ICD-10-CM

## 2017-11-20 DIAGNOSIS — E669 Obesity, unspecified: Secondary | ICD-10-CM | POA: Diagnosis not present

## 2017-11-20 LAB — PULMONARY FUNCTION TEST
DL/VA % pred: 70 %
DL/VA: 3.21 ml/min/mmHg/L
DLCO unc % pred: 41 %
DLCO unc: 12.99 ml/min/mmHg
FEF 25-75 Post: 0.77 L/sec
FEF 25-75 Pre: 0.69 L/sec
FEF2575-%Change-Post: 12 %
FEF2575-%Pred-Post: 44 %
FEF2575-%Pred-Pre: 39 %
FEV1-%Change-Post: 5 %
FEV1-%Pred-Post: 54 %
FEV1-%Pred-Pre: 51 %
FEV1-Post: 1.43 L
FEV1-Pre: 1.35 L
FEV1FVC-%Change-Post: 0 %
FEV1FVC-%Pred-Pre: 85 %
FEV6-%Change-Post: 3 %
FEV6-%Pred-Post: 65 %
FEV6-%Pred-Pre: 63 %
FEV6-Post: 2.3 L
FEV6-Pre: 2.21 L
FEV6FVC-%Change-Post: -1 %
FEV6FVC-%Pred-Post: 104 %
FEV6FVC-%Pred-Pre: 106 %
FVC-%Change-Post: 5 %
FVC-%Pred-Post: 62 %
FVC-%Pred-Pre: 59 %
FVC-Post: 2.36 L
FVC-Pre: 2.23 L
Post FEV1/FVC ratio: 61 %
Post FEV6/FVC ratio: 97 %
Pre FEV1/FVC ratio: 61 %
Pre FEV6/FVC Ratio: 99 %
RV % pred: 125 %
RV: 3.33 L
TLC % pred: 84 %
TLC: 5.79 L

## 2017-11-20 MED ORDER — TIOTROPIUM BROMIDE MONOHYDRATE 1.25 MCG/ACT IN AERS: 2.0000 | INHALATION_SPRAY | Freq: Every day | RESPIRATORY_TRACT | 0 refills | Status: DC

## 2017-11-20 MED ORDER — FLUTICASONE FUROATE-VILANTEROL 100-25 MCG/INH IN AEPB: 1.0000 | INHALATION_SPRAY | Freq: Every day | RESPIRATORY_TRACT | 0 refills | Status: DC

## 2017-11-20 NOTE — Patient Instructions (Addendum)
ICD-10-CM   1. Stage 2 moderate COPD by GOLD classification ([place]) J44.9   2. Multiple lung nodules on CT R91.8   3. Obesity (BMI 30-39.9) E66.9     You have moderate bordering on severe copd You do drop oxygen with walking even with spiriva   plan -continue spiriva respimat daily - start low dose breo daily  - albuterol as needed  - cMA to ensure all prescriptions to [facility] -continue night o2 as before (as long as you drop oe2 with walking you are dropping o2 at night) - wil lhold off day portable o2 at your request  - discuss rehab at followup    Multiple lung nodules on CT - do CT chest without contrast Nov/dec 2019 as followup from Jan 2019  Obesity  - download myfitness pal free app  - track caloric intake for a week - then set for weight loss aiming to lose 1-2 pound a month and It will give you caloric ceiling  Followup Nov dec 2019 after CT chest

## 2017-11-20 NOTE — Progress Notes (Signed)
Subjective:     Patient ID: Julian Brown, male   DOB: Oct 19, 1932, 84 y.o.   MRN: [id number]  PCP Julian Solian, MD   HPI  PCP Julian Solian, MD  HPI   IOV 10/25/2017  Chief Complaint  Patient presents with  . Consult    Referred by Dr, Julian Brown due to breathing. Pt has complaints of hoarseness for quite some time maybe two to three years. Pt becomes SOB when going upstairs or if does any exertion, cough with clear muucs.   Julian Brown is a 84 year old former smoker.  Referred by Dr. Dagmar Brown for evaluation of cough and shortness of breath.  Patient presents with his wife and history is gained from review of the past medical records and talking to the patient and wife.  Wife gives most of the history.  Patient defers to the wife.  As best as I can gather his most significant problem is hoarseness of voice.  This is chronic.  It has been there for at least a few years and more prominent in the last year and a half and worse after a cholecystectomy in January 2019 which was emergent and gangrenous.  The last few months he seen Dr. Wilburn Brown who apparently thought that the vocal cords looked inflamed.  Patient was referred to Dr. Cristina Brown GI and is on double amount of Protonix.  Medication review associated with the symptom shows that he is on fish oil.  He has been on double Protonix for the last few weeks and so far overall he has not noticed any improvement in his hoarseness of voice.  He also has chronic shortness of breath and cough.  Shortness of breath is been going on for at least a year and so is the cough.  They are unable to give me specific details but definitely both symptoms are worse after the emergent cholecystectomy in January 2019.  At that the postoperative phase in the hospital he did desaturate with exertion and has been using nighttime oxygen since then.  He is somewhat reluctant to take portable oxygen.  Today when we walked him he did desaturate with exertion  and was corrected by 2 L of oxygen.  He did have a CT scan of the chest back in January that I personally visualized and it does show emphysema.  There is also aortic atherosclerosis but they did show me a report from [facility] in October 2017 that shows he had a normal cardiac stress test.  He has had previous pneumonia in August 2016 and the left lower lobe according to the wife.  His most recent chest x-ray with Dr. Dagmar Brown was in February 2019 that I have not visualized.  Of note he has some new findings of 3 mm left lobe and 4 mm left upper lobe nodule on the CT scan of the chest from January 2019.       IMPRESSION: Mediastinum/Nodes: Thoracic inlet demonstrates heterogeneity within the left lobe of the thyroid with an apparent 2.2 cm hypodense lesion. This has been previously biopsied. No hilar or mediastinal adenopathy is noted. The esophagus is within normal limits.  Lungs/Pleura: Lungs are well aerated bilaterally. Very mild right middle lobe scarring is again seen and stable. Mild right lower lobe atelectasis is seen. No sizable effusion is noted. 2 left upper lobe nodules are again seen and stable. Mild emphysematous changes are seen.   Stable left upper lobe nodules. No follow-up needed if patient is low-risk (and has no known  or suspected primary neoplasm). Non-contrast chest CT can be considered in 9 months if patient is high-risk. This recommendation follows the consensus statement: Guidelines for Management of Incidental Pulmonary Nodules Detected on CT Images: From the Fleischner Society 2017; Radiology 2017; 284:228-243.  Stable right middle lobe scarring.  Mild right lower lobe atelectatic changes new from the prior exam.  Stable left thyroid nodule which has been previously biopsied.  Aortic Atherosclerosis (ICD10-I70.0) and Emphysema (ICD10-J43.9).   Electronically Signed   By: Julian Brown M.D.   On: 06/15/2017 16:14  OV 11/20/2017  Chief  Complaint  Patient presents with  . Follow-up    PFT performed today.  Pt states he has been doing well since last visit.      84 year old male presents with his wife for follow-up of response to Spiriva for emphysema on CT scan of the chest and pulmonary function test.  He feels that Spiriva has helped him but objectively when we walked him again walking desaturation test showed exertional hypoxemia very similar to before.  He uses chronic daily night O2 but he does not want to use daytime exertional portable oxygen despite desaturation.  He on the other hand is open to taking more inhalers.  His pulmonary function test done today shows stage II bordering on stage III COPD.  We discussed the results to him.  His other issues lung nodules: He will have an upcoming CT scan of the chest end of the year 2019  Wife brought up new issue of obesity: She feels that he is obese with a BMI greater than 30.  She feels that 20 pounds of weight loss could help him significantly with the shortness of breath and cardiac situation.  She wanted dietary advice and we discussed this briefly.  He is not a diabetic and is not on any medications.   Results for COLBIN, JOVEL (MRN [id number]) as of 11/20/2017 11:02  Ref. Range 11/20/2017 09:12  FEV1-Post Latest Units: L 1.43  FEV1-%Pred-Post Latest Units: % 54  FEV1-%Change-Post Latest Units: % 5  Post FEV1/FVC ratio Latest Units: % 61  FEV1FVC-%Change-Post Latest Units: % 0  Results for MCKENNON, ZWART (MRN [id number]) as of 11/20/2017 11:02  Ref. Range 11/20/2017 09:12  DLCO unc Latest Units: ml/min/mmHg 12.99  DLCO unc % pred Latest Units: % 41    Simple office walk 185 feet x  3 laps goal with forehead probe 10/25/2017  11/20/2017   O2 used Room air Room air  Number laps completed 2 and desaturated 2 and desaturated  Comments about pace Normal with limp Normal pace  Resting Pulse Ox/HR 96% and 63/min 95% and 61/min  Final Pulse Ox/HR 84% and 81/min 87% and  85 at 2nd lap  Desaturated </= 88% yes yes  Desaturated <= 3% points yes yes  Got Tachycardic >/= 90/min no no  Symptoms at end of test No complaints No complaints  Miscellaneous comments Corrected with 2L O2 correcte with 2nd lap     has a past medical history of Acute gangrenous cholecystitis s/p lap cholecystectomy 06/08/2017 (06/08/2017), Arthritis, ED (erectile dysfunction), Hyperlipemia, Hypertension, Prostate cancer ([place]), and Wears glasses.   reports that he quit smoking about 30 years ago. His smoking use included cigarettes. He has a 36.00 pack-year smoking history. He has never used smokeless tobacco.  Past Surgical History:  Procedure Laterality Date  . APPENDECTOMY    . CARPAL TUNNEL RELEASE  5/12   lt  . CARPAL TUNNEL RELEASE  12/13/2011  Procedure: CARPAL TUNNEL RELEASE;  Surgeon: [name]., MD;  Location: [place];  Service: Orthopedics;  Laterality: Right;  and inject right wrist  . CHOLECYSTECTOMY N/A 06/08/2017   Procedure: LAPAROSCOPIC CHOLECYSTECTOMY WITH LYSIS OF ADHESIONS;  Surgeon: [name], MD;  Location: [facility];  Service: General;  Laterality: N/A;  . [place]   hemicolectomy-rt-ca  . COLONOSCOPY      Allergies  Allergen Reactions  . Penicillins Swelling    Has patient had a PCN reaction causing immediate rash, facial/tongue/throat swelling, SOB or lightheadedness with hypotension: no (lip swelling "as soon as the pill touched my mouth so I didn't swallow it") Has patient had a PCN reaction causing severe rash involving mucus membranes or skin necrosis: no Has patient had a PCN reaction that required hospitalization: no Has patient had a PCN reaction occurring within the last 10 years: no If all of the above answers are "NO", then may proceed with Ceph    Immunization History  Administered Date(s) Administered  . Influenza, High Dose Seasonal PF 02/11/2017    No family history on file.   Current Outpatient  Medications:  .  acetaminophen (TYLENOL) 500 MG tablet, As long as your having pain I would take the Tylenol(acetaminophen) every 8 hours.  He can buy this over-the-counter at any drugstore. Do not exceed 4000 mg of Tylenol/acetaminophen in any 24-hour., Disp: 30 tablet, Rfl: 0 .  amLODipine (NORVASC) 10 MG tablet, Take 10 mg by mouth daily., Disp: , Rfl:  .  aspirin EC 81 MG tablet, Take 81 mg by mouth daily., Disp: , Rfl:  .  cholecalciferol (VITAMIN D) 1000 UNITS tablet, Take 2,000 Units by mouth daily. , Disp: , Rfl:  .  Multiple Vitamins-Minerals (PRESERVISION AREDS 2) CAPS, Take by mouth., Disp: , Rfl:  .  olmesartan-hydrochlorothiazide (BENICAR HCT) 40-12.5 MG tablet, Take 1 tablet by mouth daily., Disp: , Rfl:  .  pantoprazole (PROTONIX) 40 MG tablet, , Disp: , Rfl:  .  rosuvastatin (CRESTOR) 10 MG tablet, Take 10 mg by mouth daily., Disp: , Rfl:  .  Tiotropium Bromide Monohydrate (SPIRIVA RESPIMAT) 1.25 MCG/ACT AERS, Inhale 2 puffs into the lungs daily., Disp: 1 Inhaler, Rfl: 0 .  hydrochlorothiazide (MICROZIDE) 12.5 MG capsule, , Disp: , Rfl:  .  sucralfate (CARAFATE) 1 g tablet, Take 1 tablet (1 g total) by mouth 4 (four) times daily -  with meals and at bedtime., Disp: 120 tablet, Rfl: 0    Review of Systems     Objective:   Physical Exam  Constitutional: He is oriented to person, place, and time. He appears well-developed and well-nourished. No distress.  HENT:  Head: Normocephalic and atraumatic.  Right Ear: External ear normal.  Left Ear: External ear normal.  Mouth/Throat: Oropharynx is clear and moist. No oropharyngeal exudate.  Eyes: Pupils are equal, round, and reactive to light. Conjunctivae and EOM are normal. Right eye exhibits no discharge. Left eye exhibits no discharge. No scleral icterus.  Neck: Normal range of motion. Neck supple. No JVD present. No tracheal deviation present. No thyromegaly present.  Cardiovascular: Normal rate, regular rhythm and intact distal  pulses. Exam reveals no gallop and no friction rub.  No murmur heard. Pulmonary/Chest: Effort normal and breath sounds normal. No respiratory distress. He has no wheezes. He has no rales. He exhibits no tenderness.  Abdominal: Soft. Bowel sounds are normal. He exhibits no distension and no mass. There is no tenderness. There is no rebound  and no guarding.  Musculoskeletal: Normal range of motion. He exhibits no edema or tenderness.  Lymphadenopathy:    He has no cervical adenopathy.  Neurological: He is alert and oriented to person, place, and time. He has normal reflexes. No cranial nerve deficit. Coordination normal.  Skin: Skin is warm and dry. No rash noted. He is not diaphoretic. No erythema. No pallor.  Psychiatric: He has a normal mood and affect. His behavior is normal. Judgment and thought content normal.  Nursing note and vitals reviewed.  Vitals:   11/20/17 1044  BP: 132/78  Pulse: 61  SpO2: 95%  Weight: 229 lb (103.9 kg)  Height: 5\' 9"  (1.753 m)   Estimated body mass index is 33.82 kg/m as calculated from the following:   Height as of this encounter: 5\' 9"  (1.753 m).   Weight as of this encounter: 229 lb (103.9 kg).     Assessment:       ICD-10-CM   1. Stage 2 moderate COPD by GOLD classification ([place]) J44.9   2. Multiple lung nodules on CT R91.8   3. Obesity (BMI 30-39.9) E66.9        Plan:     COPD You have moderate bordering on severe copd You do drop oxygen with walking even with spiriva   plan -continue spiriva respimat daily - start low dose breo daily  - albuterol as needed  - cMA to ensure all prescriptions to [facility] -continue night o2 as before (as long as you drop oe2 with walking you are dropping o2 at night) - wil lhold off day portable o2 at your request  - discuss rehab at followup    Multiple lung nodules on CT -ct chest end of they year 2019   Obesity  - download myfitness pal free app  - track caloric intake for a week - then  set for weight loss aiming to lose 1-2 pound a month and It will give you caloric ceiling  Followup Nov dec 2019 after CT chest

## 2017-11-20 NOTE — Progress Notes (Signed)
PFT done today. 

## 2017-11-20 NOTE — Addendum Note (Signed)
Addended by: Madolyn Frieze on: 11/20/2017 03:04 PM   Modules accepted: Orders

## 2018-01-17 ENCOUNTER — Telehealth: Payer: Self-pay | Admitting: Internal Medicine

## 2018-01-17 MED ORDER — FLUTICASONE FUROATE-VILANTEROL 100-25 MCG/INH IN AEPB: 1.0000 | INHALATION_SPRAY | Freq: Every day | RESPIRATORY_TRACT | 3 refills | Status: DC

## 2018-01-17 MED ORDER — TIOTROPIUM BROMIDE MONOHYDRATE 1.25 MCG/ACT IN AERS: 2.0000 | INHALATION_SPRAY | Freq: Every day | RESPIRATORY_TRACT | 3 refills | Status: DC

## 2018-01-17 NOTE — Telephone Encounter (Signed)
Per MW last OV note   You have moderate bordering on severe copd You do drop oxygen with walking even with spiriva   plan -continue spiriva respimat daily - start low dose breo daily  - albuterol as needed             - cMA to ensure all prescriptions to [facility] -continue night o2 as before (as long as you drop oe2 with walking you are dropping o2 at night) - wil lhold off day portable o2 at your request  - discuss rehab at followup  ------------  Called and spoke with patients wife, advised her of this. Patient is supposed to be taking these prescriptions. Patient states he needs refill. Refills sent. Nothing further needed.

## 2018-03-23 ENCOUNTER — Telehealth (INDEPENDENT_AMBULATORY_CARE_PROVIDER_SITE_OTHER): Payer: Self-pay | Admitting: Orthopaedic Surgery

## 2018-03-23 NOTE — Telephone Encounter (Signed)
appt made next week

## 2018-03-23 NOTE — Telephone Encounter (Signed)
Patient called advised he would like to get the injections in both of his knees again. Patient said his knees are hurting again. The number to contact  Patient is [phone]

## 2018-03-26 ENCOUNTER — Ambulatory Visit (INDEPENDENT_AMBULATORY_CARE_PROVIDER_SITE_OTHER): Payer: Medicare Other | Admitting: Orthopaedic Surgery

## 2018-03-26 ENCOUNTER — Encounter (INDEPENDENT_AMBULATORY_CARE_PROVIDER_SITE_OTHER): Payer: Self-pay | Admitting: Orthopaedic Surgery

## 2018-03-26 DIAGNOSIS — G8929 Other chronic pain: Secondary | ICD-10-CM | POA: Diagnosis not present

## 2018-03-26 DIAGNOSIS — M25531 Pain in right wrist: Secondary | ICD-10-CM

## 2018-03-26 DIAGNOSIS — M25561 Pain in right knee: Secondary | ICD-10-CM | POA: Diagnosis not present

## 2018-03-26 DIAGNOSIS — M1711 Unilateral primary osteoarthritis, right knee: Secondary | ICD-10-CM

## 2018-03-26 DIAGNOSIS — M1712 Unilateral primary osteoarthritis, left knee: Secondary | ICD-10-CM

## 2018-03-26 DIAGNOSIS — M25562 Pain in left knee: Secondary | ICD-10-CM

## 2018-03-26 MED ORDER — METHYLPREDNISOLONE ACETATE 40 MG/ML IJ SUSP
40.0000 mg | INTRAMUSCULAR | Status: AC | PRN
  Administered 2018-03-26: 40 mg via INTRA_ARTICULAR

## 2018-03-26 MED ORDER — LIDOCAINE HCL 1 % IJ SOLN
3.0000 mL | INTRAMUSCULAR | Status: AC | PRN
  Administered 2018-03-26: 3 mL

## 2018-03-26 MED ORDER — LIDOCAINE HCL 1 % IJ SOLN
1.0000 mL | INTRAMUSCULAR | Status: AC | PRN
  Administered 2018-03-26: 1 mL

## 2018-03-26 NOTE — Progress Notes (Signed)
Office Visit Note   Patient: Julian Brown           Date of Birth: 08-20-1932           MRN: [id number] Visit Date: 03/26/2018              Requested by: Prince Solian, MD [address] [place], [place] [address] PCP: Prince Solian, MD   Assessment & Plan: Visit Diagnoses:  1. Unilateral primary osteoarthritis, left knee   2. Unilateral primary osteoarthritis, right knee   3. Pain in right wrist   4. Chronic pain of left knee   5. Chronic pain of right knee     Plan: Per his wishes I did provide steroid injections in both knees and his right wrist.  He tolerated all these well.  All question concerns were answered and addressed.  Follow-up will be as needed.  He knows to wait at least 3 to 4 months between injections.  Follow-Up Instructions: Return if symptoms worsen or fail to improve.   Orders:  Orders Placed This Encounter  Procedures  . Large Joint Inj: R knee  . Large Joint Inj: L knee  . Medium Joint Inj: R radiocarpal   No orders of the defined types were placed in this encounter.     Procedures: Large Joint Inj: R knee on 03/26/2018 4:25 PM Indications: diagnostic evaluation and pain Details: 22 G 1.5 in needle, superolateral approach  Arthrogram: No  Medications: 3 mL lidocaine 1 %; 40 mg methylPREDNISolone acetate 40 MG/ML Outcome: tolerated well, no immediate complications Procedure, treatment alternatives, risks and benefits explained, specific risks discussed. Consent was given by the patient. Immediately prior to procedure a time out was called to verify the correct patient, procedure, equipment, support staff and site/side marked as required. Patient was prepped and draped in the usual sterile fashion.   Large Joint Inj: L knee on 03/26/2018 4:25 PM Indications: diagnostic evaluation and pain Details: 22 G 1.5 in needle, superolateral approach  Arthrogram: No  Medications: 3 mL lidocaine 1 %; 40 mg methylPREDNISolone acetate 40  MG/ML Outcome: tolerated well, no immediate complications Procedure, treatment alternatives, risks and benefits explained, specific risks discussed. Consent was given by the patient. Immediately prior to procedure a time out was called to verify the correct patient, procedure, equipment, support staff and site/side marked as required. Patient was prepped and draped in the usual sterile fashion.   Medium Joint Inj: R radiocarpal on 03/26/2018 4:25 PM Medications: 1 mL lidocaine 1 %; 40 mg methylPREDNISolone acetate 40 MG/ML      Clinical Data: No additional findings.   Subjective: Chief Complaint  Patient presents with  . Left Knee - Pain  . Right Knee - Pain  The patient comes in today hoping to have steroid injections in both his knees and his right wrist.  We have injected all 3 areas before but is been a long period of time.  He has known arthritis in his right wrist as well as both his knees.  There is been quite some time since we injected him last.  Of believe it was a year ago.  He said no other significant changes in medical status.  He is not interested in any type of surgical intervention.  He is not a diabetic either.  HPI  Review of Systems He currently denies any headache, chest pain, shortness of breath, fever, chills, nausea, vomiting.  Objective: Vital Signs: There were no vitals taken for this visit.  Physical  Exam He is alert and oriented x3 and in no acute distress Ortho Exam Examination of his right wrist shows pain throughout his range of motion.  It mainly hurts in the central wrist joint itself.  His knees have slight varus malalignment with slight effusions.  Both knees have global tenderness especially with range of motion but is full. Specialty Comments:  No specialty comments available.  Imaging: No results found.   PMFS History: Patient Active Problem List   Diagnosis Date Noted  . Hypertension   . Hyperlipemia   . Acute gangrenous  cholecystitis s/p lap cholecystectomy 06/08/2017 06/08/2017  . Pain in right wrist 01/04/2017  . Chronic pain of left knee 01/04/2017  . Chronic pain of right knee 01/04/2017  . Unilateral primary osteoarthritis, left knee 01/04/2017  . Unilateral primary osteoarthritis, right knee 01/04/2017   Past Medical History:  Diagnosis Date  . Acute gangrenous cholecystitis s/p lap cholecystectomy 06/08/2017 06/08/2017  . Arthritis   . ED (erectile dysfunction)   . Hyperlipemia   . Hypertension   . Prostate cancer [facility])    prostate cancer-tx radiation  . Wears glasses     History reviewed. No pertinent family history.  Past Surgical History:  Procedure Laterality Date  . APPENDECTOMY    . CARPAL TUNNEL RELEASE  5/12   lt  . CARPAL TUNNEL RELEASE  12/13/2011   Procedure: CARPAL TUNNEL RELEASE;  Surgeon: [name]., MD;  Location: [place];  Service: Orthopedics;  Laterality: Right;  and inject right wrist  . CHOLECYSTECTOMY N/A 06/08/2017   Procedure: LAPAROSCOPIC CHOLECYSTECTOMY WITH LYSIS OF ADHESIONS;  Surgeon: [name], MD;  Location: [facility];  Service: General;  Laterality: N/A;  . [place]   hemicolectomy-rt-ca  . COLONOSCOPY     Social History   Occupational History  . Not on file  Tobacco Use  . Smoking status: Former Smoker    Packs/day: 1.00    Years: 36.00    Pack years: 36.00    Types: Cigarettes    Last attempt to quit: 12/09/1986    Years since quitting: 31.3  . Smokeless tobacco: Never Used  Substance and Sexual Activity  . Alcohol use: Yes    Comment: occ  . Drug use: No  . Sexual activity: Not on file

## 2018-04-17 ENCOUNTER — Ambulatory Visit (INDEPENDENT_AMBULATORY_CARE_PROVIDER_SITE_OTHER)
Admission: RE | Admit: 2018-04-17 | Discharge: 2018-04-17 | Disposition: A | Discharge status: Home / Self Care | Payer: Medicare Other | Source: Ambulatory Visit | Attending: Internal Medicine | Admitting: Internal Medicine

## 2018-04-17 DIAGNOSIS — R918 Other nonspecific abnormal finding of lung field: Secondary | ICD-10-CM

## 2018-04-18 ENCOUNTER — Ambulatory Visit (INDEPENDENT_AMBULATORY_CARE_PROVIDER_SITE_OTHER): Payer: Medicare Other | Admitting: Internal Medicine

## 2018-04-18 ENCOUNTER — Encounter: Payer: Self-pay | Admitting: Internal Medicine

## 2018-04-18 VITALS — BP 110/70 | HR 67 | Ht 71.0 in | Wt 222.0 lb

## 2018-04-18 DIAGNOSIS — R911 Solitary pulmonary nodule: Secondary | ICD-10-CM

## 2018-04-18 DIAGNOSIS — J449 Chronic obstructive pulmonary disease, unspecified: Secondary | ICD-10-CM | POA: Diagnosis not present

## 2018-04-18 DIAGNOSIS — Z23 Encounter for immunization: Secondary | ICD-10-CM | POA: Diagnosis not present

## 2018-04-18 DIAGNOSIS — R918 Other nonspecific abnormal finding of lung field: Secondary | ICD-10-CM | POA: Diagnosis not present

## 2018-04-18 DIAGNOSIS — G4734 Idiopathic sleep related nonobstructive alveolar hypoventilation: Secondary | ICD-10-CM

## 2018-04-18 NOTE — Progress Notes (Signed)
PCP Prince Solian, MD   HPI  PCP Prince Solian, MD  HPI   IOV 10/25/2017  Chief Complaint  Patient presents with  . Consult    Referred by Dr, Steva Ready Avva due to breathing. Pt has complaints of hoarseness for quite some time maybe two to three years. Pt becomes SOB when going upstairs or if does any exertion, cough with clear muucs.   Romey Mathieson is a 84 year old former smoker.  Referred by Dr. Dagmar Hait for evaluation of cough and shortness of breath.  Patient presents with his wife and history is gained from review of the past medical records and talking to the patient and wife.  Wife gives most of the history.  Patient defers to the wife.  As best as I can gather his most significant problem is hoarseness of voice.  This is chronic.  It has been there for at least a few years and more prominent in the last year and a half and worse after a cholecystectomy in January 2019 which was emergent and gangrenous.  The last few months he seen Dr. Wilburn Cornelia who apparently thought that the vocal cords looked inflamed.  Patient was referred to Dr. Cristina Gong [facility] and is on double amount of Protonix.  Medication review associated with the symptom shows that he is on fish oil.  He has been on double Protonix for the last few weeks and so far overall he has not noticed any improvement in his hoarseness of voice.  He also has chronic shortness of breath and cough.  Shortness of breath is been going on for at least a year and so is the cough.  They are unable to give me specific details but definitely both symptoms are worse after the emergent cholecystectomy in January 2019.  At that the postoperative phase in the hospital he did desaturate with exertion and has been using nighttime oxygen since then.  He is somewhat reluctant to take portable oxygen.  Today when we walked him he did desaturate with exertion and was corrected by 2 L of oxygen.  He did have a CT scan of the chest back in January that I  personally visualized and it does show emphysema.  There is also aortic atherosclerosis but they did show me a report from [facility] in October 2017 that shows he had a normal cardiac stress test.  He has had previous pneumonia in August 2016 and the left lower lobe according to the wife.  His most recent chest x-ray with Dr. Dagmar Hait was in February 2019 that I have not visualized.  Of note he has some new findings of 3 mm left lobe and 4 mm left upper lobe nodule on the CT scan of the chest from January 2019.       IMPRESSION: Mediastinum/Nodes: Thoracic inlet demonstrates heterogeneity within the left lobe of the thyroid with an apparent 2.2 cm hypodense lesion. This has been previously biopsied. No hilar or mediastinal adenopathy is noted. The esophagus is within normal limits.  Lungs/Pleura: Lungs are well aerated bilaterally. Very mild right middle lobe scarring is again seen and stable. Mild right lower lobe atelectasis is seen. No sizable effusion is noted. 2 left upper lobe nodules are again seen and stable. Mild emphysematous changes are seen.   Stable left upper lobe nodules. No follow-up needed if patient is low-risk (and has no known or suspected primary neoplasm). Non-contrast chest CT can be considered in 9 months if patient is high-risk. This recommendation follows  the consensus statement: Guidelines for Management of Incidental Pulmonary Nodules Detected on CT Images: From the Fleischner Society 2017; Radiology 2017; 284:228-243.  Stable right middle lobe scarring.  Mild right lower lobe atelectatic changes new from the prior exam.  Stable left thyroid nodule which has been previously biopsied.  Aortic Atherosclerosis (ICD10-I70.0) and Emphysema (ICD10-J43.9).   Electronically Signed   By: Inez Catalina M.D.   On: 06/15/2017 16:14  OV 11/20/2017  Chief Complaint  Patient presents with  . Follow-up    PFT performed today.  Pt states he has been  doing well since last visit.      84 year old male presents with his wife for follow-up of response to Spiriva for emphysema on CT scan of the chest and pulmonary function test.  He feels that Spiriva has helped him but objectively when we walked him again walking desaturation test showed exertional hypoxemia very similar to before.  He uses chronic daily night O2 but he does not want to use daytime exertional portable oxygen despite desaturation.  He on the other hand is open to taking more inhalers.  His pulmonary function test done today shows stage II bordering on stage III COPD.  We discussed the results to him.  His other issues lung nodules: He will have an upcoming CT scan of the chest end of the year 2019  Wife brought up new issue of obesity: She feels that he is obese with a BMI greater than 30.  She feels that 20 pounds of weight loss could help him significantly with the shortness of breath and cardiac situation.  She wanted dietary advice and we discussed this briefly.  He is not a diabetic and is not on any medications.       OV 04/18/2018  Subjective:  Patient ID: DORTHY HUSTEAD, male , DOB: [birth year] , age 8 y.o. , MRN: [id number] , ADDRESS: [address] [place] [address]   04/18/2018 -   Chief Complaint  Patient presents with  . Follow-up    Ct 04/17/18. Breathing is doing okay at this time coughing up some mucus white in color.     HPI TIONNE DAYHOFF 84 y.o. -presents for follow-up with his wife.  He is now on Spiriva and last visit we added Breo.  He is not fully sure if it is better.  He thinks he is better but overall he feels he stable.  He wants to have a high-dose flu shot today.  He had a CT scan of the chest for his multiple lung nodules and is stable but he has a new 5 mm left upper lobe nodule.  At this point in time he is using his nocturnal oxygen but also using daytime portable oxygen as needed.Marland Kitchen  He is asking if he can travel without nocturnal  oxygen because he wants to travel.  In addition he is also trying to consolidate the portable and nocturnal oxygen systems because he does not really like the nocturnal oxygen system.  He is willing to have himself retested    Results for CLIFFTON, SPRADLEY (MRN [id number]) as of 11/20/2017 11:02  Ref. Range 11/20/2017 09:12  FEV1-Post Latest Units: L 1.43  FEV1-%Pred-Post Latest Units: % 54  FEV1-%Change-Post Latest Units: % 5  Post FEV1/FVC ratio Latest Units: % 61  FEV1FVC-%Change-Post Latest Units: % 0  Results for KELECHI, ORGERON (MRN [id number]) as of 11/20/2017 11:02  Ref. Range 11/20/2017 09:12  DLCO unc Latest Units: ml/min/mmHg 12.99  DLCO  unc % pred Latest Units: % 41    Simple office walk 185 feet x  3 laps goal with forehead probe 10/25/2017  11/20/2017   O2 used Room air Room air  Number laps completed 2 and desaturated 2 and desaturated  Comments about pace Normal with limp Normal pace  Resting Pulse Ox/HR 96% and 63/min 95% and 61/min  Final Pulse Ox/HR 84% and 81/min 87% and 85 at 2nd lap  Desaturated </= 88% yes yes  Desaturated <= 3% points yes yes  Got Tachycardic >/= 90/min no no  Symptoms at end of test No complaints No complaints  Miscellaneous comments Corrected with 2L O2 correcte with 2nd lap     Ct Chest Wo Contrast  Result Date: 04/17/2018 CLINICAL DATA:  Pulmonary nodules. EXAM: CT CHEST WITHOUT CONTRAST TECHNIQUE: Multidetector CT imaging of the chest was performed following the standard protocol without IV contrast. COMPARISON:  06/15/2017 FINDINGS: Cardiovascular: The heart size is normal. No substantial pericardial effusion. Coronary artery calcification is evident. Atherosclerotic calcification is noted in the wall of the thoracic aorta. Mediastinum/Nodes: No mediastinal lymphadenopathy. No evidence for gross hilar lymphadenopathy although assessment is limited by the lack of intravenous contrast on today's study. The esophagus has normal imaging features.  Similar appearance bilateral thyroid nodules measuring up to 2.2 cm in the left lobe. Lungs/Pleura: The central tracheobronchial airways are patent. Centrilobular emphysema noted. Stable appearance of scarring in the right middle lobe. 5 mm left upper lobe nodule (83/3) is new in the interval. Chronic atelectasis or scarring in the inferior left lower lobe is unchanged. Medial 2 mm left upper lobe nodule is similar to prior. No pleural effusion. Upper Abdomen: Stable appearance multiple hepatic cysts of varying size stable left adrenal thickening. Musculoskeletal: No worrisome lytic or sclerotic osseous abnormality. IMPRESSION: 1. New 5 mm left upper lobe pulmonary nodule. No follow-up needed if patient is low-risk. Non-contrast chest CT can be considered in 12 months if patient is high-risk. This recommendation follows the consensus statement: Guidelines for Management of Incidental Pulmonary Nodules Detected on CT Images: From the Fleischner Society 2017; Radiology 2017; 284:228-243. 2. Stable 2 mm left upper lobe pulmonary nodule. 3.  Aortic Atherosclerois (ICD10-170.0) 4.  Emphysema. ([id number]) Electronically Signed   By: Misty Stanley M.D.   On: 04/17/2018 15:39    ROS - per HPI     has a past medical history of Acute gangrenous cholecystitis s/p lap cholecystectomy 06/08/2017 (06/08/2017), Arthritis, ED (erectile dysfunction), Hyperlipemia, Hypertension, Prostate cancer ([place]), and Wears glasses.   reports that he quit smoking about 31 years ago. His smoking use included cigarettes. He has a 36.00 pack-year smoking history. He has never used smokeless tobacco.  Past Surgical History:  Procedure Laterality Date  . APPENDECTOMY    . CARPAL TUNNEL RELEASE  5/12   lt  . CARPAL TUNNEL RELEASE  12/13/2011   Procedure: CARPAL TUNNEL RELEASE;  Surgeon: [name]., MD;  Location: [facility];  Service: Orthopedics;  Laterality: Right;  and inject right wrist  . CHOLECYSTECTOMY  N/A 06/08/2017   Procedure: LAPAROSCOPIC CHOLECYSTECTOMY WITH LYSIS OF ADHESIONS;  Surgeon: [name], MD;  Location: [facility];  Service: General;  Laterality: N/A;  . [place]   hemicolectomy-rt-ca  . COLONOSCOPY      Allergies  Allergen Reactions  . Penicillins Swelling    Has patient had a PCN reaction causing immediate rash, facial/tongue/throat swelling, SOB or lightheadedness with hypotension: no (lip swelling "as soon  as the pill touched my mouth so I didn't swallow it") Has patient had a PCN reaction causing severe rash involving mucus membranes or skin necrosis: no Has patient had a PCN reaction that required hospitalization: no Has patient had a PCN reaction occurring within the last 10 years: no If all of the above answers are "NO", then may proceed with Ceph    Immunization History  Administered Date(s) Administered  . Influenza, High Dose Seasonal PF 02/11/2017    No family history on file.   Current Outpatient Medications:  .  acetaminophen (TYLENOL) 500 MG tablet, As long as your having pain I would take the Tylenol(acetaminophen) every 8 hours.  He can buy this over-the-counter at any drugstore. Do not exceed 4000 mg of Tylenol/acetaminophen in any 24-hour., Disp: 30 tablet, Rfl: 0 .  amLODipine (NORVASC) 10 MG tablet, Take 10 mg by mouth daily., Disp: , Rfl:  .  aspirin EC 81 MG tablet, Take 81 mg by mouth daily., Disp: , Rfl:  .  cholecalciferol (VITAMIN D) 1000 UNITS tablet, Take 2,000 Units by mouth daily. , Disp: , Rfl:  .  fluticasone furoate-vilanterol (BREO ELLIPTA) 100-25 MCG/INH AEPB, Inhale 1 puff into the lungs daily., Disp: 1 each, Rfl: 3 .  hydrochlorothiazide (MICROZIDE) 12.5 MG capsule, , Disp: , Rfl:  .  Multiple Vitamins-Minerals (PRESERVISION AREDS 2) CAPS, Take by mouth., Disp: , Rfl:  .  olmesartan-hydrochlorothiazide (BENICAR HCT) 40-12.5 MG tablet, Take 1 tablet by mouth daily., Disp: , Rfl:  .  pantoprazole (PROTONIX) 40 MG  tablet, , Disp: , Rfl:  .  rosuvastatin (CRESTOR) 10 MG tablet, Take 10 mg by mouth daily., Disp: , Rfl:  .  Tiotropium Bromide Monohydrate (SPIRIVA RESPIMAT) 1.25 MCG/ACT AERS, Inhale 2 puffs into the lungs daily., Disp: 1 Inhaler, Rfl: 0 .  Tiotropium Bromide Monohydrate (SPIRIVA RESPIMAT) 1.25 MCG/ACT AERS, Inhale 2 puffs into the lungs daily., Disp: 1 Inhaler, Rfl: 3 .  sucralfate (CARAFATE) 1 g tablet, Take 1 tablet (1 g total) by mouth 4 (four) times daily -  with meals and at bedtime., Disp: 120 tablet, Rfl: 0      Objective:   Vitals:   04/18/18 1217  BP: 110/70  Pulse: 67  SpO2: 94%  Weight: 222 lb (100.7 kg)  Height: 5\' 11"  (1.803 m)    Estimated body mass index is 30.96 kg/m as calculated from the following:   Height as of this encounter: 5\' 11"  (1.803 m).   Weight as of this encounter: 222 lb (100.7 kg).  @WEIGHTCHANGE @  [employer]   04/18/18 1217  Weight: 222 lb (100.7 kg)     Physical Exam  General Appearance:    Alert, cooperative, no distress, appears stated age - yes , Deconditioned looking - no , OBESE  - yes, Sitting on Wheelchair -  no  Head:    Normocephalic, without obvious abnormality, atraumatic  Eyes:    PERRL, conjunctiva/corneas clear,  Ears:    Normal TM's and external ear canals, both ears  Nose:   Nares normal, septum midline, mucosa normal, no drainage    or sinus tenderness. OXYGEN ON  - no . Patient is @ ra   Throat:   Lips, mucosa, and tongue normal; teeth and gums normal. Cyanosis on lips - no  Neck:   Supple, symmetrical, trachea midline, no adenopathy;    thyroid:  no enlargement/tenderness/nodules; no carotid   bruit or JVD  Back:     Symmetric, no curvature, ROM normal, no  CVA tenderness  Lungs:     Distress - no , Wheeze no, Barrell Chest - no, Purse lip breathing - no, Crackles - no   Chest Wall:    No tenderness or deformity.    Heart:    Regular rate and rhythm, S1 and S2 normal, no rub   or gallop, Murmur - no  Breast  Exam:    NOT DONE  Abdomen:     Soft, non-tender, bowel sounds active all four quadrants,    no masses, no organomegaly. Visceral obesity - yes  Genitalia:   NOT DONE  Rectal:   NOT DONE  Extremities:   Extremities - normal, Has Cane - no, Clubbing - no, Edema - no  Pulses:   2+ and symmetric all extremities  Skin:   Stigmata of Connective Tissue Disease - no  Lymph nodes:   Cervical, supraclavicular, and axillary nodes normal  Psychiatric:  Neurologic:   Pleasant - yes, Anxious - no, Flat affect - no  CAm-ICU - neg, Alert and Oriented x 3 - yes, Moves all 4s - yes, Speech - normal, Cognition - intact           Assessment:       ICD-10-CM   1. Multiple lung nodules on CT R91.8   2. Stage 2 moderate COPD by GOLD classification ([place]) J44.9   3. Nocturnal hypoxemia G47.34        Plan:     Patient Instructions  Multiple lung nodules on CT - stable jan 2019 to Nov 2019 with new 45mmLUL nodule  - repeat CT chest without contrast in 1year  Stage 2 moderate COPD by GOLD classification ([place]) Nocturnal hypoxemia  - stable disease - continue spiriva and breo scheduled - high dose flu shot 04/18/2018 - ok to use portable o2 as needed in day time - retest ONO to see if you stil continue to need it - generally ok in copd to use night o2 for 4h or so or even skip it for few days for travel needs  Followup 3-6 months or sooner if needed; CAT score and walk test (not 15mwd) at followup     SIGNATURE    Dr. Brand Males, M.D., F.C.C.P,  Pulmonary and Critical Care Medicine Staff Physician, [place] Director - Interstitial Lung Disease  Program  Pulmonary [place] [facility] [place], [place], [address]  Pager: [phone], If no answer or between  15:00h - 7:00h: call [phone] Telephone: [phone]  12:52 PM 04/18/2018

## 2018-04-18 NOTE — Patient Instructions (Addendum)
Multiple lung nodules on CT - stable jan 2019 to Nov 2019 with new 38mmLUL nodule  - repeat CT chest without contrast in 1year  Stage 2 moderate COPD by GOLD classification ([place]) Nocturnal hypoxemia  - stable disease - continue spiriva and breo scheduled - high dose flu shot 04/18/2018 - ok to use portable o2 as needed in day time - retest ONO to see if you stil continue to need it - generally ok in copd to use night o2 for 4h or so or even skip it for few days for travel needs  Followup 3-6 months or sooner if needed; CAT score and walk test (not 25mwd) at followup

## 2018-04-23 ENCOUNTER — Ambulatory Visit: Payer: Medicare Other | Admitting: Internal Medicine

## 2018-05-02 ENCOUNTER — Encounter: Payer: Self-pay | Admitting: Internal Medicine

## 2018-05-07 ENCOUNTER — Telehealth: Payer: Self-pay | Admitting: Internal Medicine

## 2018-05-07 NOTE — Telephone Encounter (Signed)
ONO on RA 05/01/18 - pulse ox < 88% t 5.5h.  Plan  - if already on 2L at night - requalify. Otherwise, start 2l [place] at night  - ensure followup

## 2018-05-09 NOTE — Telephone Encounter (Signed)
Called and spoke with pt letting him know the results of the ONO. Pt expressed understanding. Pt states he does wear O2 at night but stated he thought he might be wearing 1.5L. I stated to pt to make sure he wears 2L at night.  I did schedule pt a follow up appt with Wyn Quaker, Wednesday 05/16/18 at 10:45. I also gave pt our new office address and phone number.

## 2018-05-15 NOTE — Progress Notes (Signed)
@Patient  ID: Julian Brown, male    DOB: 08/03/1932, 84 y.o.   MRN: [id number]  Chief Complaint  Patient presents with  . Follow-up    COPD follow-up    Referring provider: Prince Solian, MD  HPI:  84 year old male former smoker followed in our office for COPD, chronic respiratory failure with nighttime hypoxemia  PMH: Hyperlipidemia, hypertension Smoker/ Smoking History: Former Smoker, 36 pack year Maintenance:  Breo 100,  Sprivia 1.25 Pt of: Dr. Chase Caller  05/16/2018  - Visit   84 year old male patient presenting today for follow-up visit.  Patient reports he has been nonadherent to his inhaler regimen.  Patient reports he is taking his Breo Ellipta 100 but is not rinsing his mouth out after use.  Patient has not been taking his Spiriva 1.25.  Patient reports he did not know that he could take those together.  Patient reports he routinely forgets to take his Spiriva.  Patient has had increased hoarseness, cough and sputum production over the last 3 weeks.  Patient believes this may be related to allergies.  Wife has similar symptoms.  Patient is recently completed overnight oximetry test which confirmed he needs 2 L of oxygen at night.  Patient increased his 1.5 L that he was using at night to 2 L.  MMRC - Breathlessness Score 2 - on level ground, I walk slower than people of the same age because of breathlessness, or have to stop for breathe when walking to my own pace  Patient and spouse are interested in a portable oxygen concentrator for when they travel.  They are contemplating purchasing an [employer] out of pocket.    Tests:  05/01/2018- overnight oximetry on room air- pulse oximetry less than 88% for 5-1/2 hours >>>Start 2 L nasal cannula at night  04/17/2018-CT chest without contrast-new 5 mm left upper lobe pulmonary nodule, noncontrast chest CT can be considered in 12 months patient is high risk, emphysema  11/20/2017-pulmonary function test FVC 2.23 (59% predicted),  postbronchodilator ratio 61, FEV1 54, no significant bronchodilator response, DLCO 41 which corrects to 70 with lung volumes Severe obstructive airways disease, probable restriction, severe diffusion defect  FENO:  No results found for: NITRICOXIDE  PFT: PFT Results Latest Ref Rng & Units 11/20/2017  FVC-Pre L 2.23  FVC-Predicted Pre % 59  FVC-Post L 2.36  FVC-Predicted Post % 62  Pre FEV1/FVC % % 61  Post FEV1/FCV % % 61  FEV1-Pre L 1.35  FEV1-Predicted Pre % 51  FEV1-Post L 1.43  DLCO UNC% % 41  DLCO COR %Predicted % 70  TLC L 5.79  TLC % Predicted % 84  RV % Predicted % 125    Imaging: Ct Chest Wo Contrast  Result Date: 04/17/2018 CLINICAL DATA:  Pulmonary nodules. EXAM: CT CHEST WITHOUT CONTRAST TECHNIQUE: Multidetector CT imaging of the chest was performed following the standard protocol without IV contrast. COMPARISON:  06/15/2017 FINDINGS: Cardiovascular: The heart size is normal. No substantial pericardial effusion. Coronary artery calcification is evident. Atherosclerotic calcification is noted in the wall of the thoracic aorta. Mediastinum/Nodes: No mediastinal lymphadenopathy. No evidence for gross hilar lymphadenopathy although assessment is limited by the lack of intravenous contrast on today's study. The esophagus has normal imaging features. Similar appearance bilateral thyroid nodules measuring up to 2.2 cm in the left lobe. Lungs/Pleura: The central tracheobronchial airways are patent. Centrilobular emphysema noted. Stable appearance of scarring in the right middle lobe. 5 mm left upper lobe nodule (83/3) is new in the interval. Chronic  atelectasis or scarring in the inferior left lower lobe is unchanged. Medial 2 mm left upper lobe nodule is similar to prior. No pleural effusion. Upper Abdomen: Stable appearance multiple hepatic cysts of varying size stable left adrenal thickening. Musculoskeletal: No worrisome lytic or sclerotic osseous abnormality. IMPRESSION: 1. New 5  mm left upper lobe pulmonary nodule. No follow-up needed if patient is low-risk. Non-contrast chest CT can be considered in 12 months if patient is high-risk. This recommendation follows the consensus statement: Guidelines for Management of Incidental Pulmonary Nodules Detected on CT Images: From the Fleischner Society 2017; Radiology 2017; 284:228-243. 2. Stable 2 mm left upper lobe pulmonary nodule. 3.  Aortic Atherosclerois (ICD10-170.0) 4.  Emphysema. ([id number]) Electronically Signed   By: Misty Stanley M.D.   On: 04/17/2018 15:39    Chart Review:    Specialty Problems      Pulmonary Problems   Allergic rhinitis   Nocturnal hypoxemia    05/01/2018- overnight oximetry on room air- pulse oximetry less than 88% for 5-1/2 hours >>>Start 2 L nasal cannula at night      Stage 2 moderate COPD by GOLD classification ([place])    11/20/2017-pulmonary function test FVC 2.23 (59% predicted), postbronchodilator ratio 61, FEV1 54, no significant bronchodilator response, DLCO 41 which corrects to 70 with lung volumes Severe obstructive airways disease, probable restriction, severe diffusion defect         Allergies  Allergen Reactions  . Penicillins Swelling    Has patient had a PCN reaction causing immediate rash, facial/tongue/throat swelling, SOB or lightheadedness with hypotension: no (lip swelling "as soon as the pill touched my mouth so I didn't swallow it") Has patient had a PCN reaction causing severe rash involving mucus membranes or skin necrosis: no Has patient had a PCN reaction that required hospitalization: no Has patient had a PCN reaction occurring within the last 10 years: no If all of the above answers are "NO", then may proceed with Ceph    Immunization History  Administered Date(s) Administered  . Influenza, High Dose Seasonal PF 02/11/2017, 04/18/2018    Past Medical History:  Diagnosis Date  . Acute gangrenous cholecystitis s/p lap cholecystectomy 06/08/2017  06/08/2017  . Arthritis   . ED (erectile dysfunction)   . Hyperlipemia   . Hypertension   . Prostate cancer [facility])    prostate cancer-tx radiation  . Wears glasses     Tobacco History: Social History   Tobacco Use  Smoking Status Former Smoker  . Packs/day: 1.00  . Years: 36.00  . Pack years: 36.00  . Types: Cigarettes  . Last attempt to quit: 12/09/1986  . Years since quitting: 31.4  Smokeless Tobacco Never Used   Counseling given: Yes  Continue to not smoke  Outpatient Encounter Medications as of 05/16/2018  Medication Sig  . acetaminophen (TYLENOL) 500 MG tablet As long as your having pain I would take the Tylenol(acetaminophen) every 8 hours.  He can buy this over-the-counter at any drugstore. Do not exceed 4000 mg of Tylenol/acetaminophen in any 24-hour.  Marland Kitchen amLODipine (NORVASC) 10 MG tablet Take 10 mg by mouth daily.  . cholecalciferol (VITAMIN D) 1000 UNITS tablet Take 2,000 Units by mouth daily.   . fluticasone furoate-vilanterol (BREO ELLIPTA) 100-25 MCG/INH AEPB Inhale 1 puff into the lungs daily.  . Multiple Vitamins-Minerals (PRESERVISION AREDS 2) CAPS Take by mouth.  . olmesartan-hydrochlorothiazide (BENICAR HCT) 40-12.5 MG tablet Take 1 tablet by mouth daily. Please order from [facility]  . pantoprazole (PROTONIX) 40 MG tablet   .  rosuvastatin (CRESTOR) 10 MG tablet Take 10 mg by mouth daily.  . Tiotropium Bromide Monohydrate (SPIRIVA RESPIMAT) 1.25 MCG/ACT AERS Inhale 2 puffs into the lungs daily.  . [DISCONTINUED] hydrochlorothiazide (MICROZIDE) 12.5 MG capsule   . [DISCONTINUED] Tiotropium Bromide Monohydrate (SPIRIVA RESPIMAT) 1.25 MCG/ACT AERS Inhale 2 puffs into the lungs daily.  Marland Kitchen aspirin EC 81 MG tablet Take 81 mg by mouth daily.  Marland Kitchen azithromycin (ZITHROMAX) 250 MG tablet 500mg  (two tablets) today, then 250mg  (1 tablet) for the next 4 days  . predniSONE (DELTASONE) 10 MG tablet 4 tabs for 2 days, then 3 tabs for 2 days, 2 tabs for 2 days, then 1 tab for 2  days, then stop  . sucralfate (CARAFATE) 1 g tablet Take 1 tablet (1 g total) by mouth 4 (four) times daily -  with meals and at bedtime.   No facility-administered encounter medications on file as of 05/16/2018.      Review of Systems  Review of Systems  Constitutional: Positive for fatigue. Negative for activity change, chills, fever and unexpected weight change.  HENT: Positive for voice change. Negative for congestion, postnasal drip, sinus pressure, sinus pain, sneezing and sore throat.   Eyes: Negative.   Respiratory: Positive for cough (thick white mucous sometimes, sometimes dry). Negative for shortness of breath and wheezing.   Cardiovascular: Negative for chest pain and palpitations.  Gastrointestinal: Negative for constipation, diarrhea, nausea and vomiting.  Endocrine: Negative.   Musculoskeletal: Negative.   Skin: Negative.   Neurological: Negative for dizziness and headaches.  Psychiatric/Behavioral: Negative.  Negative for dysphoric mood. The patient is not nervous/anxious.   All other systems reviewed and are negative.    Physical Exam  BP (!) 126/58 (BP Location: Left Arm, Cuff Size: Normal)   Pulse 69   Ht 5' 9.25" (1.759 m)   Wt 219 lb 12.8 oz (99.7 kg)   SpO2 90%   BMI 32.22 kg/m   Wt Readings from Last 5 Encounters:  05/16/18 219 lb 12.8 oz (99.7 kg)  04/18/18 222 lb (100.7 kg)  11/20/17 229 lb (103.9 kg)  10/25/17 227 lb 3.2 oz (103.1 kg)  06/08/17 230 lb (104.3 kg)    Physical Exam  Constitutional: He is oriented to person, place, and time and well-developed, well-nourished, and in no distress. No distress.  HENT:  Head: Normocephalic and atraumatic.  Right Ear: Hearing and external ear normal.  Left Ear: Hearing and external ear normal.  Nose: Mucosal edema and rhinorrhea present. Right sinus exhibits no maxillary sinus tenderness and no frontal sinus tenderness. Left sinus exhibits no maxillary sinus tenderness and no frontal sinus tenderness.    Mouth/Throat: Uvula is midline and oropharynx is clear and moist. No oropharyngeal exudate.  +TMs with effusion without infection, difficult to visualize Right TM due to cerumen, +PND  Eyes: Pupils are equal, round, and reactive to light.  Neck: Normal range of motion. Neck supple. No JVD present.  Cardiovascular: Normal rate, regular rhythm and normal heart sounds.  Pulmonary/Chest: Effort normal. No accessory muscle usage. No respiratory distress. He has no decreased breath sounds. He has wheezes (exp wheeze). He has no rhonchi.  Abdominal: Soft. Bowel sounds are normal. There is no tenderness.  Musculoskeletal: Normal range of motion. He exhibits no edema.  Lymphadenopathy:    He has no cervical adenopathy.  Neurological: He is alert and oriented to person, place, and time. Gait normal.  Skin: Skin is warm and dry. He is not diaphoretic. No erythema.  Psychiatric: Mood, memory,  affect and judgment normal.  Nursing note and vitals reviewed.     Lab Results:  CBC    Component Value Date/Time   WBC 8.5 06/15/2017 0419   RBC 3.82 (L) 06/15/2017 0419   HGB 12.8 (L) 06/15/2017 0419   HCT 38.0 (L) 06/15/2017 0419   PLT 347 06/15/2017 0419   MCV 99.5 06/15/2017 0419   MCH 33.5 06/15/2017 0419   MCHC 33.7 06/15/2017 0419   RDW 14.0 06/15/2017 0419   LYMPHSABS 0.9 06/13/2017 1016   MONOABS 1.5 (H) 06/13/2017 1016   EOSABS 0.0 06/13/2017 1016   BASOSABS 0.0 06/13/2017 1016    BMET    Component Value Date/Time   NA 133 (L) 06/15/2017 0419   K 3.8 06/15/2017 0419   CL 99 (L) 06/15/2017 0419   CO2 28 06/15/2017 0419   GLUCOSE 95 06/15/2017 0419   BUN 20 06/15/2017 0419   CREATININE 1.01 06/15/2017 0419   CALCIUM 9.0 06/15/2017 0419   GFRNONAA >60 06/15/2017 0419   GFRAA >60 06/15/2017 0419    BNP No results found for: BNP  ProBNP No results found for: PROBNP    Assessment & Plan:   Pleasant 84 year old male patient completing follow-up with our office today.   Patient with slight COPD exacerbation today we will treat with prednisone and azithromycin.  I believe this is due to nonadherence with inhalers as well as allergic rhinitis flare.  Patient to start taking Zyrtec daily.  Patient also to resume Breo 100 as well as Spiriva 1.25 use.  Walk in office today to qualify for portable oxygen concentrator.  Patient's oxygen saturations did drop to 87% requiring 2 L via POC.  Patient did not want to proceed further.  We can always reevaluate her resolution of COPD and AR flare.  Patient to follow-up with our office in 4 to 6 weeks to ensure symptoms are improving.  Stage 2 moderate COPD by GOLD classification ([facility]) Azithromycin 250mg  tablet  >>>Take 2 tablets (500mg  total) today, and then 1 tablet (250mg ) for the next four days  >>>take with food  >>>can also take probiotic and / or yogurt while on antibiotic   Prednisone 10mg  tablet  >>>4 tabs for 2 days, then 3 tabs for 2 days, 2 tabs for 2 days, then 1 tab for 2 days, then stop >>>take with food  >>>take in the morning   Spiriva Respimat 1.25 >>> 2 puffs daily >>> Do this every day >>>This is not a rescue inhaler  Breo Ellipta 100 >>> Take 1 puff daily in the morning right when you wake up >>>Rinse your mouth out after use >>>This is a daily maintenance inhaler, NOT a rescue inhaler >>>Contact our office if you are having difficulties affording or obtaining this medication >>>It is important for you to be able to take this daily and not miss any doses   Okay to use inhalers together   Note your daily symptoms > remember "red flags" for COPD:   >>>Increase in cough >>>increase in sputum production >>>increase in shortness of breath or activity  intolerance.   If you notice these symptoms, please call the office to be seen.   Follow up in 4-6 weeks to ensure symptoms are improving    Allergic rhinitis  Start daily zyrtec  >>>can use generic   Can use flonase as needed for  congestion or allergy symptoms  Follow up in 4-6 weeks to ensure symptoms are improving    Nocturnal hypoxemia  Continue home oxygen 2L  at night   Follow up in 4-6 weeks to ensure symptoms are improving    Abnormal finding on lung imaging Complete follow-up CT in [place], NP 05/16/2018

## 2018-05-16 ENCOUNTER — Encounter: Payer: Self-pay | Admitting: Pulmonary Disease

## 2018-05-16 ENCOUNTER — Ambulatory Visit (INDEPENDENT_AMBULATORY_CARE_PROVIDER_SITE_OTHER): Payer: Medicare Other | Admitting: Pulmonary Disease

## 2018-05-16 VITALS — BP 126/58 | HR 69 | Ht 69.25 in | Wt 219.8 lb

## 2018-05-16 DIAGNOSIS — G4734 Idiopathic sleep related nonobstructive alveolar hypoventilation: Secondary | ICD-10-CM | POA: Diagnosis not present

## 2018-05-16 DIAGNOSIS — J309 Allergic rhinitis, unspecified: Secondary | ICD-10-CM | POA: Diagnosis not present

## 2018-05-16 DIAGNOSIS — R918 Other nonspecific abnormal finding of lung field: Secondary | ICD-10-CM | POA: Diagnosis not present

## 2018-05-16 DIAGNOSIS — J449 Chronic obstructive pulmonary disease, unspecified: Secondary | ICD-10-CM | POA: Diagnosis not present

## 2018-05-16 MED ORDER — AZITHROMYCIN 250 MG PO TABS: ORAL_TABLET | ORAL | 0 refills | Status: DC

## 2018-05-16 MED ORDER — PREDNISONE 10 MG PO TABS: ORAL_TABLET | ORAL | 0 refills | Status: DC

## 2018-05-16 NOTE — Assessment & Plan Note (Signed)
Complete follow-up CT in November/2020

## 2018-05-16 NOTE — Assessment & Plan Note (Addendum)
  Continue home oxygen 2L at night   If you travel to go on extended vacation please work with your DME company to secure a rental option for overnight oxygen.  Follow up in 4-6 weeks to ensure symptoms are improving

## 2018-05-16 NOTE — Patient Instructions (Addendum)
Azithromycin 250mg  tablet  >>>Take 2 tablets (500mg  total) today, and then 1 tablet (250mg ) for the next four days  >>>take with food  >>>can also take probiotic and / or yogurt while on antibiotic   Prednisone 10mg  tablet  >>>4 tabs for 2 days, then 3 tabs for 2 days, 2 tabs for 2 days, then 1 tab for 2 days, then stop >>>take with food  >>>take in the morning   Continue home oxygen 2L at night   Start daily zyrtec  >>>can use generic   Can use flonase as needed for congestion or allergy symptoms  Walk for POC today   Spiriva Respimat 1.25 >>> 2 puffs daily >>> Do this every day >>>This is not a rescue inhaler  Breo Ellipta 100 >>> Take 1 puff daily in the morning right when you wake up >>>Rinse your mouth out after use >>>This is a daily maintenance inhaler, NOT a rescue inhaler >>>Contact our office if you are having difficulties affording or obtaining this medication >>>It is important for you to be able to take this daily and not miss any doses   Okay to use inhalers together    Note your daily symptoms > remember "red flags" for COPD:   >>>Increase in cough >>>increase in sputum production >>>increase in shortness of breath or activity  intolerance.   If you notice these symptoms, please call the office to be seen.    Complete follow-up CT in November/2020   Follow up in 4-6 weeks to ensure symptoms are improving     It is flu season:   >>>Remember to be washing your hands regularly, using hand sanitizer, be careful to use around herself with has contact with people who are sick will increase her chances of getting sick yourself. >>> Best ways to protect herself from the flu: Receive the yearly flu vaccine, practice good hand hygiene washing with soap and also using hand sanitizer when available, eat a nutritious meals, get adequate rest, hydrate appropriately   Please contact the office if your symptoms worsen or you have concerns that you are not  improving.   Thank you for choosing [facility] for your healthcare, and for allowing [place] to partner with you on your healthcare journey. I am thankful to be able to provide care to you today.   Wyn Quaker FNP-C    Home Oxygen Use, Adult When a medical condition keeps you from getting enough oxygen, your health care provider may instruct you to take extra oxygen at home. Your health care provider will let you know:  When to take oxygen.  For how long to take oxygen.  How quickly oxygen should be delivered (flow rate), in liters per minute (LPM or L/M).  Home oxygen can be given through:  A mask.  A nasal cannula. This is a device or tube that goes in the nostrils.  A transtracheal catheter. This is a small, flexible tube placed in the trachea.  A tracheostomy. This is a surgically made opening in the trachea.  These devices are connected with tubing to an oxygen source, such as:  A tank. Tanks hold oxygen in gas form. They must be replaced when the oxygen is used up.  A liquid oxygen device. This holds oxygen in liquid form. It must be replaced when the oxygen is used up.  An oxygen concentrator machine. This filters oxygen in the room. It uses electricity, so you must have a backup cylinder of oxygen in case the power  goes out.  Supplies needed: To use oxygen, you will need:  A mask, nasal cannula, transtracheal catheter, or tracheostomy.  An oxygen tank, a liquid oxygen device, or an oxygen concentrator.  The tape that your health care provider recommends (optional).  If you use a transtracheal catheter and your prescribed flow rate is 1 LPM or greater, you will also need a humidifier. Risks and complications  Fire. This can happen if the oxygen is exposed to a heat source, flame, or spark.  Injury to skin. This can happen if liquid oxygen touches your skin.  Organ damage. This can happen if you get too little oxygen. How to use oxygen Your health care  provider will show you how to use your oxygen device. Follow her or his instructions. They may look something like this: 1. Wash your hands. 2. If you use an oxygen concentrator, make sure it is plugged in. 3. Place one end of the tube into the port on the tank, device, or machine. 4. Place the mask over your nose and mouth. Or, place the nasal cannula and secure it with tape if instructed. If you use a tracheostomy or transtracheal catheter, connect it to the oxygen source as directed. 5. Make sure the liter-flow setting on the machine is at the level prescribed by your health care provider. 6. Turn on the machine or adjust the knob on the tank or device to the correct liter-flow setting. 7. When you are done, turn off and unplug the machine, or turn the knob to OFF.  How to clean and care for the oxygen supplies Nasal cannula  Clean it with a warm, wet cloth daily or as needed.  Wash it with a liquid soap once a week.  Rinse it thoroughly once or twice a week.  Replace it every 2-4 weeks.  If you have an infection, such as a cold or pneumonia, change the cannula when you get better. Mask  Replace it every 2-4 weeks.  If you have an infection, such as a cold or pneumonia, change the mask when you get better. Humidifier bottle  Wash the bottle between each refill: ? Wash it with soap and warm water. ? Rinse it thoroughly. ? Disinfect it and its top. ? Air-dry it.  Make sure it is dry before you refill it. Oxygen concentrator  Clean the air filter at least twice a week according to directions from your home medical equipment and service company.  Wipe down the cabinet every day. To do this: ? Unplug the unit. ? Wipe down the cabinet with a damp cloth. ? Dry the cabinet. Other equipment  Change any extra tubing every 1-3 months.  Follow instructions from your health care provider about taking care of any other equipment. Safety tips Fire safety tips   Keep your  oxygen and oxygen supplies at least 5 ft away from sources of heat, flames, and sparks at all times.  Do not allow smoking near your oxygen. Put up "no smoking" signs in your home.  Do not use materials that can burn (are flammable) while you use oxygen.  When you go to a restaurant with portable oxygen, ask to be seated in the nonsmoking section.  Keep a Data processing manager close by. Let your fire department know that you have oxygen in your home.  Test your home smoke detectors regularly. General safety tips  If you use an oxygen cylinder, make sure it is in a stand or secured to an object that will  not move (fixed object).  If you use liquid oxygen, make sure its container is kept upright.  If you use an oxygen concentrator: ? Dance movement psychotherapist company. Make sure you are given priority service in the event that your power goes out. ? Avoid using extension cords, if possible. Follow these instructions at home:  Use oxygen only as told by your health care provider.  Do not use alcohol or other drugs that make you relax (sedating drugs) unless instructed. They can slow down your breathing rate and make it hard to get in enough oxygen.  Know how and when to order a refill of oxygen.  Always keep a spare tank of oxygen. Plan ahead for holidays when you may not be able to get a prescription filled.  Use water-based lubricants on your lips or nostrils. Do not use oil-based products like petroleum jelly.  To prevent skin irritation on your cheeks or behind your ears, tuck some gauze under the tubing. Contact a health care provider if:  You get headaches often.  You have shortness of breath.  You have a lasting cough.  You have anxiety.  You are sleepy all the time.  You develop an illness that affects your breathing.  You cannot exercise at your regular level.  You are restless.  You have difficult or irregular breathing, and it is getting worse.  You have a  fever.  You have persistent redness under your nose. Get help right away if:  You are confused.  You have blue lips or fingernails.  You are struggling to breathe. This information is not intended to replace advice given to you by your health care provider. Make sure you discuss any questions you have with your health care provider. Document Released: 08/20/2003 Document Revised: 01/27/2016 Document Reviewed: 12/22/2015 Elsevier Interactive Patient Education  [employer].

## 2018-05-16 NOTE — Assessment & Plan Note (Signed)
  Start daily zyrtec  >>>can use generic   Can use flonase as needed for congestion or allergy symptoms  Follow up in 4-6 weeks to ensure symptoms are improving

## 2018-05-16 NOTE — Assessment & Plan Note (Signed)
Azithromycin 250mg  tablet  >>>Take 2 tablets (500mg  total) today, and then 1 tablet (250mg ) for the next four days  >>>take with food  >>>can also take probiotic and / or yogurt while on antibiotic   Prednisone 10mg  tablet  >>>4 tabs for 2 days, then 3 tabs for 2 days, 2 tabs for 2 days, then 1 tab for 2 days, then stop >>>take with food  >>>take in the morning   Spiriva Respimat 1.25 >>> 2 puffs daily >>> Do this every day >>>This is not a rescue inhaler  Breo Ellipta 100 >>> Take 1 puff daily in the morning right when you wake up >>>Rinse your mouth out after use >>>This is a daily maintenance inhaler, NOT a rescue inhaler >>>Contact our office if you are having difficulties affording or obtaining this medication >>>It is important for you to be able to take this daily and not miss any doses   Okay to use inhalers together   Note your daily symptoms > remember "red flags" for COPD:   >>>Increase in cough >>>increase in sputum production >>>increase in shortness of breath or activity  intolerance.   If you notice these symptoms, please call the office to be seen.   Follow up in 4-6 weeks to ensure symptoms are improving

## 2018-06-26 NOTE — Progress Notes (Signed)
@Patient  ID: Julian Brown, male    DOB: 10-Oct-1932, 85 y.o.   MRN: [id number]  Chief Complaint  Patient presents with  . Follow-up    COPD follow up     Referring provider: Prince Solian, MD  HPI:  85 year old male former smoker followed in our office for COPD, chronic respiratory failure with nighttime hypoxemia  PMH: Hyperlipidemia, hypertension Smoker/ Smoking History: Former Smoker, 36 pack year Maintenance:  Breo 100,  Sprivia 1.25 Pt of: Dr. Chase Caller  06/27/2018  - Visit   85 year old male former smoker presenting to office today for COPD follow-up visit.  Patient reports he feels much better since last office visit.  Patient completed Z-Pak and prednisone taper.  Patient did start taking daily antihistamine.  Patient feels that his breathing has improved.  Patient was previously seen by his PCP last week who noted that his lungs were clear.  Patient remains adherent to the Brio 100 as well as Spiriva 1.25.  He has been using them every day as prescribed.  Patient rinses his mouth out after use.  Patient is even started exercising at the [facility] again mainly doing 30 minutes on a bike and sometimes walking afterwards.  MMRC - Breathlessness Score 1 - I get short of breath when hurrying on level ground or walking up a slight hill     Tests:    FENO:  No results found for: NITRICOXIDE  PFT: PFT Results Latest Ref Rng & Units 11/20/2017  FVC-Pre L 2.23  FVC-Predicted Pre % 59  FVC-Post L 2.36  FVC-Predicted Post % 62  Pre FEV1/FVC % % 61  Post FEV1/FCV % % 61  FEV1-Pre L 1.35  FEV1-Predicted Pre % 51  FEV1-Post L 1.43  DLCO UNC% % 41  DLCO COR %Predicted % 70  TLC L 5.79  TLC % Predicted % 84  RV % Predicted % 125    Imaging: No results found.    Specialty Problems      Pulmonary Problems   Allergic rhinitis   Nocturnal hypoxemia    05/01/2018- overnight oximetry on room air- pulse oximetry less than 88% for 5-1/2 hours >>>Start 2 L nasal  cannula at night      Stage 2 moderate COPD by GOLD classification ([place])    11/20/2017-pulmonary function test FVC 2.23 (59% predicted), postbronchodilator ratio 61, FEV1 54, no significant bronchodilator response, DLCO 41 which corrects to 70 with lung volumes Severe obstructive airways disease, probable restriction, severe diffusion defect         Allergies  Allergen Reactions  . Penicillins Swelling    Has patient had a PCN reaction causing immediate rash, facial/tongue/throat swelling, SOB or lightheadedness with hypotension: no (lip swelling "as soon as the pill touched my mouth so I didn't swallow it") Has patient had a PCN reaction causing severe rash involving mucus membranes or skin necrosis: no Has patient had a PCN reaction that required hospitalization: no Has patient had a PCN reaction occurring within the last 10 years: no If all of the above answers are "NO", then may proceed with Ceph    Immunization History  Administered Date(s) Administered  . Influenza, High Dose Seasonal PF 02/11/2017, 04/18/2018    Past Medical History:  Diagnosis Date  . Acute gangrenous cholecystitis s/p lap cholecystectomy 06/08/2017 06/08/2017  . Arthritis   . ED (erectile dysfunction)   . Hyperlipemia   . Hypertension   . Prostate cancer [facility])    prostate cancer-tx radiation  . Wears glasses  Tobacco History: Social History   Tobacco Use  Smoking Status Former Smoker  . Packs/day: 1.00  . Years: 36.00  . Pack years: 36.00  . Types: Cigarettes  . Last attempt to quit: 12/09/1986  . Years since quitting: 31.5  Smokeless Tobacco Never Used   Counseling given: Yes   Outpatient Encounter Medications as of 06/27/2018  Medication Sig  . acetaminophen (TYLENOL) 500 MG tablet As long as your having pain I would take the Tylenol(acetaminophen) every 8 hours.  He can buy this over-the-counter at any drugstore. Do not exceed 4000 mg of Tylenol/acetaminophen in any 24-hour.  Marland Kitchen  amLODipine (NORVASC) 10 MG tablet Take 10 mg by mouth daily.  Marland Kitchen aspirin EC 81 MG tablet Take 81 mg by mouth daily.  . cholecalciferol (VITAMIN D) 1000 UNITS tablet Take 2,000 Units by mouth daily.   . fluticasone furoate-vilanterol (BREO ELLIPTA) 100-25 MCG/INH AEPB Inhale 1 puff into the lungs daily.  . Multiple Vitamins-Minerals (PRESERVISION AREDS 2) CAPS Take by mouth.  . olmesartan-hydrochlorothiazide (BENICAR HCT) 40-12.5 MG tablet Take 1 tablet by mouth daily. Please order from [facility]  . pantoprazole (PROTONIX) 40 MG tablet   . rosuvastatin (CRESTOR) 10 MG tablet Take 10 mg by mouth daily.  . Tiotropium Bromide Monohydrate (SPIRIVA RESPIMAT) 1.25 MCG/ACT AERS Inhale 2 puffs into the lungs daily.  . [DISCONTINUED] azithromycin (ZITHROMAX) 250 MG tablet 500mg  (two tablets) today, then 250mg  (1 tablet) for the next 4 days  . [DISCONTINUED] predniSONE (DELTASONE) 10 MG tablet 4 tabs for 2 days, then 3 tabs for 2 days, 2 tabs for 2 days, then 1 tab for 2 days, then stop  . sucralfate (CARAFATE) 1 g tablet Take 1 tablet (1 g total) by mouth 4 (four) times daily -  with meals and at bedtime.   No facility-administered encounter medications on file as of 06/27/2018.      Review of Systems  Review of Systems  Constitutional: Negative for activity change, chills, fatigue, fever and unexpected weight change.  HENT: Negative for postnasal drip, rhinorrhea, sinus pressure, sinus pain, sneezing and sore throat.   Eyes: Negative.   Respiratory: Negative for cough, shortness of breath and wheezing.   Cardiovascular: Negative for chest pain, palpitations and leg swelling.  Gastrointestinal: Negative for constipation, diarrhea, nausea and vomiting.  Endocrine: Negative.   Musculoskeletal: Negative.   Skin: Negative.   Neurological: Negative for dizziness and headaches.  Psychiatric/Behavioral: Negative.  Negative for dysphoric mood. The patient is not nervous/anxious.   All other systems  reviewed and are negative.    Physical Exam  BP (!) 144/74 (BP Location: Left Arm, Cuff Size: Normal)   Pulse 79   Ht 5\' 11"  (1.803 m)   Wt 225 lb (102.1 kg)   SpO2 92%   BMI 31.38 kg/m   Wt Readings from Last 5 Encounters:  06/27/18 225 lb (102.1 kg)  05/16/18 219 lb 12.8 oz (99.7 kg)  04/18/18 222 lb (100.7 kg)  11/20/17 229 lb (103.9 kg)  10/25/17 227 lb 3.2 oz (103.1 kg)     Physical Exam  Constitutional: He is oriented to person, place, and time and well-developed, well-nourished, and in no distress. No distress.  HENT:  Head: Normocephalic and atraumatic.  Right Ear: Hearing, tympanic membrane, external ear and ear canal normal.  Left Ear: Hearing, tympanic membrane, external ear and ear canal normal.  Nose: Mucosal edema present. Right sinus exhibits no maxillary sinus tenderness and no frontal sinus tenderness. Left sinus exhibits no maxillary sinus  tenderness and no frontal sinus tenderness.  Mouth/Throat: Uvula is midline.  Eyes: Pupils are equal, round, and reactive to light.  Neck: Normal range of motion. Neck supple. No JVD present.  Cardiovascular: Normal rate, regular rhythm and normal heart sounds.  Pulmonary/Chest: Effort normal and breath sounds normal. No accessory muscle usage. No respiratory distress. He has no decreased breath sounds. He has no wheezes. He has no rhonchi.  Abdominal: Soft. Bowel sounds are normal. There is no abdominal tenderness.  Musculoskeletal: Normal range of motion.        General: No edema.  Lymphadenopathy:    He has no cervical adenopathy.  Neurological: He is alert and oriented to person, place, and time. Gait normal.  Skin: Skin is warm and dry. He is not diaphoretic. No erythema.  Psychiatric: Mood, memory, affect and judgment normal.  Nursing note and vitals reviewed.     Lab Results:  CBC    Component Value Date/Time   WBC 8.5 06/15/2017 0419   RBC 3.82 (L) 06/15/2017 0419   HGB 12.8 (L) 06/15/2017 0419   HCT  38.0 (L) 06/15/2017 0419   PLT 347 06/15/2017 0419   MCV 99.5 06/15/2017 0419   MCH 33.5 06/15/2017 0419   MCHC 33.7 06/15/2017 0419   RDW 14.0 06/15/2017 0419   LYMPHSABS 0.9 06/13/2017 1016   MONOABS 1.5 (H) 06/13/2017 1016   EOSABS 0.0 06/13/2017 1016   BASOSABS 0.0 06/13/2017 1016    BMET    Component Value Date/Time   NA 133 (L) 06/15/2017 0419   K 3.8 06/15/2017 0419   CL 99 (L) 06/15/2017 0419   CO2 28 06/15/2017 0419   GLUCOSE 95 06/15/2017 0419   BUN 20 06/15/2017 0419   CREATININE 1.01 06/15/2017 0419   CALCIUM 9.0 06/15/2017 0419   GFRNONAA >60 06/15/2017 0419   GFRAA >60 06/15/2017 0419    BNP No results found for: BNP  ProBNP No results found for: PROBNP    Assessment & Plan:     Stage 2 moderate COPD by GOLD classification ([place]) Assessment: COPD stage II Former smoker Lungs clear to auscultation today mMRC 1  Plan: Symbicort 160 Spiriva 1.25 Continue to exercise daily Continue Zyrtec Follow-up in 3 months   Allergic rhinitis Plan: Zyrtec daily Can use Flonase 1 spray each nostril as needed for nasal congestion Can also do nasal saline rinses  Abnormal finding on lung imaging Plan: CT in [place], NP 06/27/2018   This appointment was 28 minutes along with over 50% of the time in direct face-to-face patient care, assessment, plan of care, and follow-up.

## 2018-06-27 ENCOUNTER — Encounter: Payer: Self-pay | Admitting: Pulmonary Disease

## 2018-06-27 ENCOUNTER — Ambulatory Visit (INDEPENDENT_AMBULATORY_CARE_PROVIDER_SITE_OTHER): Payer: Medicare Other | Admitting: Pulmonary Disease

## 2018-06-27 ENCOUNTER — Telehealth: Payer: Self-pay | Admitting: Pulmonary Disease

## 2018-06-27 DIAGNOSIS — J449 Chronic obstructive pulmonary disease, unspecified: Secondary | ICD-10-CM | POA: Diagnosis not present

## 2018-06-27 DIAGNOSIS — R918 Other nonspecific abnormal finding of lung field: Secondary | ICD-10-CM | POA: Diagnosis not present

## 2018-06-27 DIAGNOSIS — J309 Allergic rhinitis, unspecified: Secondary | ICD-10-CM | POA: Diagnosis not present

## 2018-06-27 NOTE — Patient Instructions (Addendum)
Breo Ellipta 100 >>> Take 1 puff daily in the morning right when you wake up >>>Rinse your mouth out after use >>>This is a daily maintenance inhaler, NOT a rescue inhaler >>>Contact our office if you are having difficulties affording or obtaining this medication >>>It is important for you to be able to take this daily and not miss any doses   Spiriva Respimat 1.25 >>> 2 puffs daily >>> Do this every day >>>This is not a rescue inhaler  Continue Zyrtec daily  Can use Flonase 1 spray each nostril daily as needed for nasal congestion    Keep follow up with [facility]   Follow up in 3 months with Dr. Chase Caller  It is flu season:   >>>Remember to be washing your hands regularly, using hand sanitizer, be careful to use around herself with has contact with people who are sick will increase her chances of getting sick yourself. >>> Best ways to protect herself from the flu: Receive the yearly flu vaccine, practice good hand hygiene washing with soap and also using hand sanitizer when available, eat a nutritious meals, get adequate rest, hydrate appropriately   Please contact the office if your symptoms worsen or you have concerns that you are not improving.   Thank you for choosing [facility] for your healthcare, and for allowing [place] to partner with you on your healthcare journey. I am thankful to be able to provide care to you today.   Wyn Quaker FNP-C

## 2018-06-27 NOTE — Telephone Encounter (Signed)
[facility]C PCP office to obtain vaccination records for pna. Left message to call back

## 2018-06-27 NOTE — Assessment & Plan Note (Signed)
Plan: Zyrtec daily Can use Flonase 1 spray each nostril as needed for nasal congestion Can also do nasal saline rinses

## 2018-06-27 NOTE — Assessment & Plan Note (Addendum)
Plan: CT in November/2020

## 2018-06-27 NOTE — Assessment & Plan Note (Signed)
Assessment: COPD stage II Former smoker Lungs clear to auscultation today mMRC 1  Plan: Symbicort 160 Spiriva 1.25 Continue to exercise daily Continue Zyrtec Follow-up in 3 months

## 2018-06-28 NOTE — Telephone Encounter (Signed)
Closing message nothing further needed.

## 2018-06-28 NOTE — Telephone Encounter (Signed)
immunization has been updated within pt's chart.

## 2018-06-28 NOTE — Telephone Encounter (Signed)
Called and spoke with Kenney Houseman at patients PCP. She stated that the only pna vaccine that patient has received was the prevnar vaccine on 11/06/2014. Will route this to [place] and leave in [place] box for follow up if needed.

## 2018-07-31 ENCOUNTER — Ambulatory Visit (INDEPENDENT_AMBULATORY_CARE_PROVIDER_SITE_OTHER): Payer: Medicare Other | Admitting: Orthopaedic Surgery

## 2018-07-31 ENCOUNTER — Ambulatory Visit (INDEPENDENT_AMBULATORY_CARE_PROVIDER_SITE_OTHER): Payer: Self-pay

## 2018-07-31 ENCOUNTER — Encounter (INDEPENDENT_AMBULATORY_CARE_PROVIDER_SITE_OTHER): Payer: Self-pay | Admitting: Orthopaedic Surgery

## 2018-07-31 ENCOUNTER — Telehealth (INDEPENDENT_AMBULATORY_CARE_PROVIDER_SITE_OTHER): Payer: Self-pay

## 2018-07-31 ENCOUNTER — Ambulatory Visit (INDEPENDENT_AMBULATORY_CARE_PROVIDER_SITE_OTHER): Payer: Medicare Other

## 2018-07-31 DIAGNOSIS — M25562 Pain in left knee: Secondary | ICD-10-CM

## 2018-07-31 DIAGNOSIS — M19031 Primary osteoarthritis, right wrist: Secondary | ICD-10-CM | POA: Insufficient documentation

## 2018-07-31 DIAGNOSIS — G8929 Other chronic pain: Secondary | ICD-10-CM

## 2018-07-31 DIAGNOSIS — M25531 Pain in right wrist: Secondary | ICD-10-CM | POA: Diagnosis not present

## 2018-07-31 DIAGNOSIS — M1712 Unilateral primary osteoarthritis, left knee: Secondary | ICD-10-CM | POA: Diagnosis not present

## 2018-07-31 MED ORDER — LIDOCAINE HCL 1 % IJ SOLN
3.0000 mL | INTRAMUSCULAR | Status: AC | PRN
  Administered 2018-07-31: 3 mL

## 2018-07-31 MED ORDER — METHYLPREDNISOLONE ACETATE 40 MG/ML IJ SUSP
40.0000 mg | INTRAMUSCULAR | Status: AC | PRN
  Administered 2018-07-31: 40 mg via INTRA_ARTICULAR

## 2018-07-31 MED ORDER — LIDOCAINE HCL 1 % IJ SOLN
2.0000 mL | INTRAMUSCULAR | Status: AC | PRN
  Administered 2018-07-31: 2 mL

## 2018-07-31 NOTE — Progress Notes (Signed)
Office Visit Note   Patient: Julian Brown           Date of Birth: 1932-08-11           MRN: [id number] Visit Date: 07/31/2018              Requested by: Prince Solian, MD [address] [place], [place] [address] PCP: Prince Solian, MD   Assessment & Plan: Visit Diagnoses:  1. Chronic pain of left knee   2. Pain in right wrist   3. Unilateral primary osteoarthritis, left knee   4. Primary osteoarthritis of right wrist     Plan: A combination of the steroid injection followed a month later by hyaluronic acid in the left knee has helped him so much in the past and he like to have this regimen again today.  I provided steroid injection easily in his left knee today after counseling about the risk and benefits of injections.  He will continue to work on strengthening his quad muscles and I recommended that strengthening exercises when he goes to the [facility].  We will order hyaluronic acid for his left knee.  He does not need anything for his right knee in spite of his radiographs showing arthritis because he is pain-free with the right knee.  Also did provide a steroid injection in his right wrist today as well which he tolerated well.  Follow-Up Instructions: Return in about 4 weeks (around 08/28/2018).   Orders:  Orders Placed This Encounter  Procedures  . Large Joint Inj  . Medium Joint Inj  . XR KNEE 3 VIEW LEFT  . XR Wrist Complete Right   No orders of the defined types were placed in this encounter.     Procedures: Large Joint Inj: L knee on 07/31/2018 9:59 AM Indications: diagnostic evaluation and pain Details: 22 G 1.5 in needle, superolateral approach  Arthrogram: No  Medications: 3 mL lidocaine 1 %; 40 mg methylPREDNISolone acetate 40 MG/ML Outcome: tolerated well, no immediate complications Procedure, treatment alternatives, risks and benefits explained, specific risks discussed. Consent was given by the patient. Immediately prior to procedure a time  out was called to verify the correct patient, procedure, equipment, support staff and site/side marked as required. Patient was prepped and draped in the usual sterile fashion.   Medium Joint Inj: R radiocarpal on 07/31/2018 9:59 AM Medications: 2 mL lidocaine 1 %; 40 mg methylPREDNISolone acetate 40 MG/ML      Clinical Data: No additional findings.   Subjective: Chief Complaint  Patient presents with  . Left Knee - Pain  . Right Wrist - Pain  The patient is well-known to me.  We have seen him for years now with bilateral knee pain with left worse than the right even though the left has more joint space in the right has significant loss the medial compartment of the left knee that hurt the most.  He is a very active 85 years old.  He is active going to the gym.  He has not tried his quad training exercises.  He is requesting a steroid in his left knee today in his right wrist.  He has had chronic right wrist problems.  He is left-hand dominant.  He denies any injury to his right wrist over the years.  We have injected both areas before.  He is not a diabetic either.  He is very active at 85 years old.  HPI  Review of Systems He currently denies any  headache, chest pain, shortness of breath, fever, chills, nausea, vomiting.  Objective: Vital Signs: There were no vitals taken for this visit.  Physical Exam He is alert and orient x3 and in no acute distress Ortho Exam Examination of both his knees shows patellofemoral crepitation.  His left knee though is more painful than his right knee along the medial joint line as well.  Both knees have varus malalignment that is correctable.  Both knees are ligamentously stable.  Examination of his right wrist does show global pain.  He has limited flexion extension as well as pronation supination of the wrist.  There is some grinding as well in the wrist joint. Specialty Comments:  No specialty comments available.  Imaging: Xr Wrist Complete  Right  Result Date: 07/31/2018 3 views of the right wrist show severe arthritis of the radiocarpal joint.  There is fusion between the lunate and the capitate.  There is severe disease of the scaphoid suggesting old injuries.  There is beaking of the radial styloid and scaphoid itself  Xr Knee 3 View Left  Result Date: 07/31/2018 3 views of the left knee show no acute findings.  There is moderate osteoarthritis in all 3 compartments with particular osteophytes and joint space narrowing.  On the AP view the right knee can be seen which shows severe medial joint line loss of space.    PMFS History: Patient Active Problem List   Diagnosis Date Noted  . Primary osteoarthritis of right wrist 07/31/2018  . Stage 2 moderate COPD by GOLD classification ([place]) 05/16/2018  . Allergic rhinitis 05/16/2018  . Nocturnal hypoxemia 05/16/2018  . Abnormal finding on lung imaging 05/16/2018  . Hypertension   . Hyperlipemia   . Acute gangrenous cholecystitis s/p lap cholecystectomy 06/08/2017 06/08/2017  . Pain in right wrist 01/04/2017  . Chronic pain of left knee 01/04/2017  . Chronic pain of right knee 01/04/2017  . Unilateral primary osteoarthritis, left knee 01/04/2017  . Unilateral primary osteoarthritis, right knee 01/04/2017   Past Medical History:  Diagnosis Date  . Acute gangrenous cholecystitis s/p lap cholecystectomy 06/08/2017 06/08/2017  . Arthritis   . ED (erectile dysfunction)   . Hyperlipemia   . Hypertension   . Prostate cancer [facility])    prostate cancer-tx radiation  . Wears glasses     History reviewed. No pertinent family history.  Past Surgical History:  Procedure Laterality Date  . APPENDECTOMY    . CARPAL TUNNEL RELEASE  5/12   lt  . CARPAL TUNNEL RELEASE  12/13/2011   Procedure: CARPAL TUNNEL RELEASE;  Surgeon: [name]., MD;  Location: [place];  Service: Orthopedics;  Laterality: Right;  and inject right wrist  . CHOLECYSTECTOMY N/A  06/08/2017   Procedure: LAPAROSCOPIC CHOLECYSTECTOMY WITH LYSIS OF ADHESIONS;  Surgeon: [name], MD;  Location: [facility];  Service: General;  Laterality: N/A;  . [place]   hemicolectomy-rt-ca  . COLONOSCOPY     Social History   Occupational History  . Not on file  Tobacco Use  . Smoking status: Former Smoker    Packs/day: 1.00    Years: 36.00    Pack years: 36.00    Types: Cigarettes    Last attempt to quit: 12/09/1986    Years since quitting: 31.6  . Smokeless tobacco: Never Used  Substance and Sexual Activity  . Alcohol use: Yes    Comment: occ  . Drug use: No  . Sexual activity: Not on file

## 2018-07-31 NOTE — Telephone Encounter (Signed)
Left knee gel injection ?

## 2018-08-01 NOTE — Telephone Encounter (Signed)
Noted  

## 2018-08-06 ENCOUNTER — Telehealth (INDEPENDENT_AMBULATORY_CARE_PROVIDER_SITE_OTHER): Payer: Self-pay

## 2018-08-06 NOTE — Telephone Encounter (Signed)
Submitted VOB for Monovisc, left knee. 

## 2018-08-10 ENCOUNTER — Telehealth (INDEPENDENT_AMBULATORY_CARE_PROVIDER_SITE_OTHER): Payer: Self-pay

## 2018-08-10 NOTE — Telephone Encounter (Signed)
Tried calling patient on cell phone to schedule for gel injection, but VM was full.  Called and left a VM on home phone and advised patient to CB to schedule with Dr. Ninfa Linden.  Patient approved for Monovisc, left knee. [place] Patient is covered at 100% through 2nd insurance. No Co-pay No PA required

## 2018-08-15 ENCOUNTER — Encounter (INDEPENDENT_AMBULATORY_CARE_PROVIDER_SITE_OTHER): Payer: Self-pay | Admitting: Orthopaedic Surgery

## 2018-08-15 ENCOUNTER — Ambulatory Visit (INDEPENDENT_AMBULATORY_CARE_PROVIDER_SITE_OTHER): Payer: Medicare Other | Admitting: Orthopaedic Surgery

## 2018-08-15 DIAGNOSIS — M1712 Unilateral primary osteoarthritis, left knee: Secondary | ICD-10-CM | POA: Diagnosis not present

## 2018-08-15 MED ORDER — LIDOCAINE HCL 1 % IJ SOLN
3.0000 mL | INTRAMUSCULAR | Status: AC | PRN
  Administered 2018-08-15: 3 mL

## 2018-08-15 MED ORDER — HYALURONAN 88 MG/4ML IX SOSY
88.0000 mg | PREFILLED_SYRINGE | INTRA_ARTICULAR | Status: AC | PRN
  Administered 2018-08-15: 88 mg via INTRA_ARTICULAR

## 2018-08-15 NOTE — Progress Notes (Signed)
   Procedure Note  Patient: Julian Brown             Date of Birth: 1933/01/17           MRN: [id number]             Visit Date: 08/15/2018  Procedures: Visit Diagnoses: Unilateral primary osteoarthritis, left knee  Large Joint Inj: L knee on 08/15/2018 10:21 AM Indications: diagnostic evaluation and pain Details: 22 G 1.5 in needle, superolateral approach  Arthrogram: No  Medications: 3 mL lidocaine 1 %; 88 mg Hyaluronan 88 MG/4ML Outcome: tolerated well, no immediate complications Procedure, treatment alternatives, risks and benefits explained, specific risks discussed. Consent was given by the patient. Immediately prior to procedure a time out was called to verify the correct patient, procedure, equipment, support staff and site/side marked as required. Patient was prepped and draped in the usual sterile fashion.    The patient is here today for scheduled hyaluronic acid injection with Monovisc into the left knee to treat the pain from osteoarthritis.  He is tried failed other forms of conservative treatment with that knee.  He has had injections in the past.  On exam today he does have a moderate knee joint effusion of the left knee.  I did remove about 50 cc of serous fluid from the knee that was consistent with fluid from synovitis and osteoarthritis.  I then placed a Monovisc in his knee without difficulty.  He got already immediate relief and just having the fluid off his knee.  He understands that we are hoping this helps provide a cushioning environment for the knee.  Follow-up is as needed.

## 2018-09-26 ENCOUNTER — Ambulatory Visit: Payer: Medicare Other | Admitting: Internal Medicine

## 2018-10-09 ENCOUNTER — Ambulatory Visit: Payer: Medicare Other | Admitting: Internal Medicine

## 2018-11-09 ENCOUNTER — Other Ambulatory Visit: Payer: Self-pay | Admitting: Internal Medicine

## 2019-01-01 ENCOUNTER — Other Ambulatory Visit: Payer: Self-pay | Admitting: Internal Medicine

## 2019-01-09 ENCOUNTER — Encounter: Payer: Self-pay | Admitting: Orthopaedic Surgery

## 2019-01-09 ENCOUNTER — Ambulatory Visit (INDEPENDENT_AMBULATORY_CARE_PROVIDER_SITE_OTHER): Payer: Medicare Other | Admitting: Orthopaedic Surgery

## 2019-01-09 ENCOUNTER — Other Ambulatory Visit: Payer: Self-pay

## 2019-01-09 DIAGNOSIS — M19031 Primary osteoarthritis, right wrist: Secondary | ICD-10-CM | POA: Diagnosis not present

## 2019-01-09 DIAGNOSIS — M1712 Unilateral primary osteoarthritis, left knee: Secondary | ICD-10-CM

## 2019-01-09 MED ORDER — LIDOCAINE HCL 1 % IJ SOLN
3.0000 mL | INTRAMUSCULAR | Status: AC | PRN
  Administered 2019-01-09: 3 mL

## 2019-01-09 MED ORDER — METHYLPREDNISOLONE ACETATE 40 MG/ML IJ SUSP
40.0000 mg | INTRAMUSCULAR | Status: AC | PRN
  Administered 2019-01-09: 40 mg via INTRA_ARTICULAR

## 2019-01-09 NOTE — Progress Notes (Signed)
   Procedure Note  Patient: TRACI PLEMONS             Date of Birth: 1932/11/26           MRN: [id number]             Visit Date: 01/09/2019 HPI: Mr. Bedoya comes in today requesting injection in his left knee he has known osteoarthritis knee.  He denies any injury to the knee.  Left knee: Good range of motion.  Tenderness along medial joint line.  No abnormal warmth or effusion.  Procedures: Visit Diagnoses:  1. Unilateral primary osteoarthritis, left knee   2. Primary osteoarthritis of right wrist     Large Joint Inj: L knee on 01/09/2019 11:02 AM Indications: pain Details: 22 G 1.5 in needle, anterolateral approach  Arthrogram: No  Medications: 3 mL lidocaine 1 %; 40 mg methylPREDNISolone acetate 40 MG/ML Outcome: tolerated well, no immediate complications Procedure, treatment alternatives, risks and benefits explained, specific risks discussed. Consent was given by the patient. Immediately prior to procedure a time out was called to verify the correct patient, procedure, equipment, support staff and site/side marked as required. Patient was prepped and draped in the usual sterile fashion.     Plan: We will see him back in September.  To proceed with Monovisc injection.  No need to call the office 2 to 3 weeks prior to wanting the injection.  Did ask about his right wrist which he has documented osteoarthritis of he is having pain in the hand.  I discussed with him that he could try some diclofenac gel applied 4 times daily to the wrist.  Questions encouraged and answered

## 2019-01-10 ENCOUNTER — Telehealth: Payer: Self-pay

## 2019-01-10 ENCOUNTER — Other Ambulatory Visit: Payer: Self-pay

## 2019-01-10 NOTE — Telephone Encounter (Signed)
Left knee gel injection ?

## 2019-01-11 NOTE — Telephone Encounter (Signed)
Noted  

## 2019-01-24 ENCOUNTER — Telehealth: Payer: Self-pay

## 2019-01-24 NOTE — Telephone Encounter (Signed)
Submitted VOB for Monovisc, left knee. 

## 2019-01-25 ENCOUNTER — Telehealth: Payer: Self-pay

## 2019-01-25 NOTE — Telephone Encounter (Signed)
Patient is aware that he is approved for gel injection.  Approved for Monovisc, left knee. [place] Secondary insurance will pick up remaining eligible expenses at 100% No Co-pay No PA required  Appt. 02/20/2019 with Dr. Ninfa Linden

## 2019-02-03 IMAGING — CR DG CHEST 2V
2 series · 2 of 2 positions shown · non-contrast
Comparison: CT chest dated 03/23/2017

CLINICAL DATA: Dyspnea

EXAM:
CHEST  2 VIEW

[w chest lat]
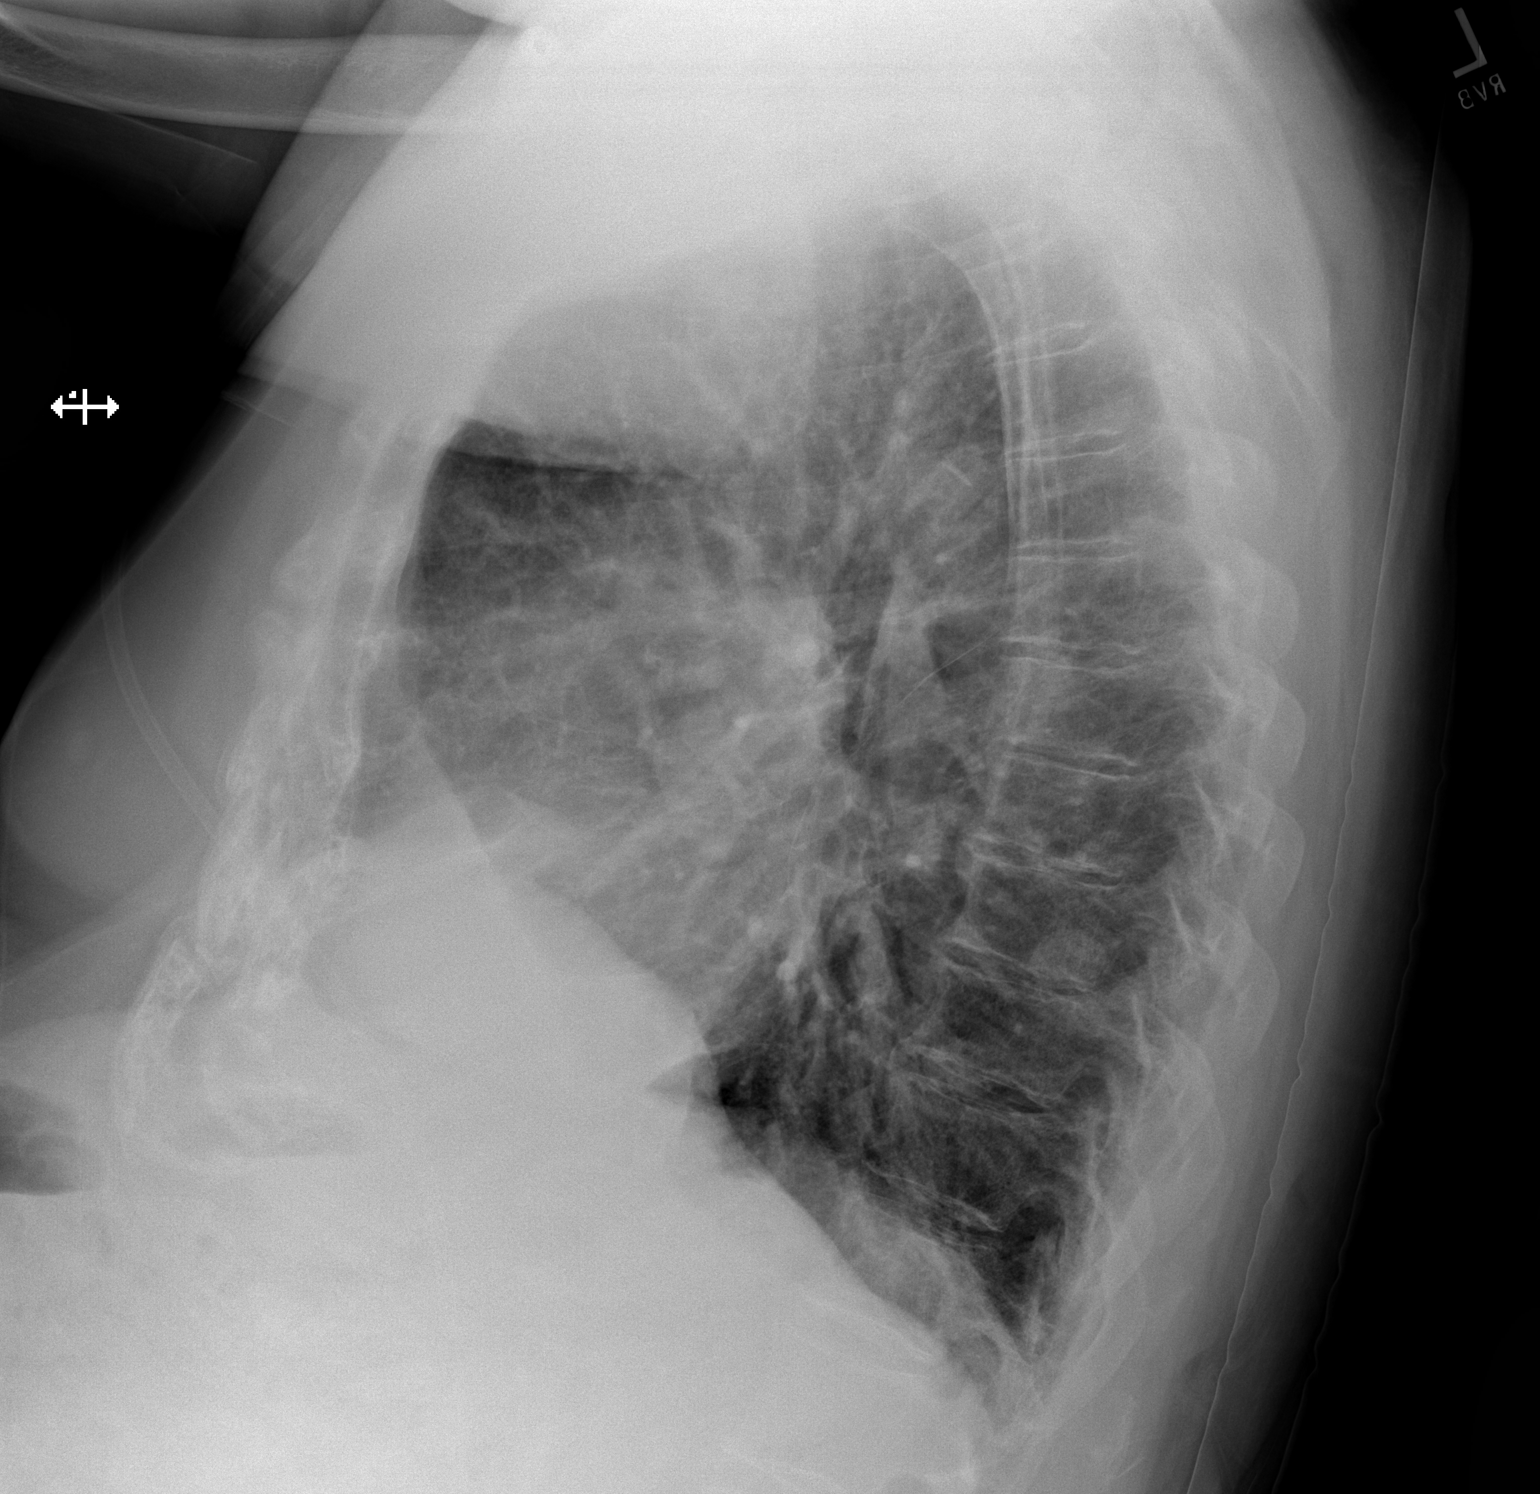

[x chest ap]
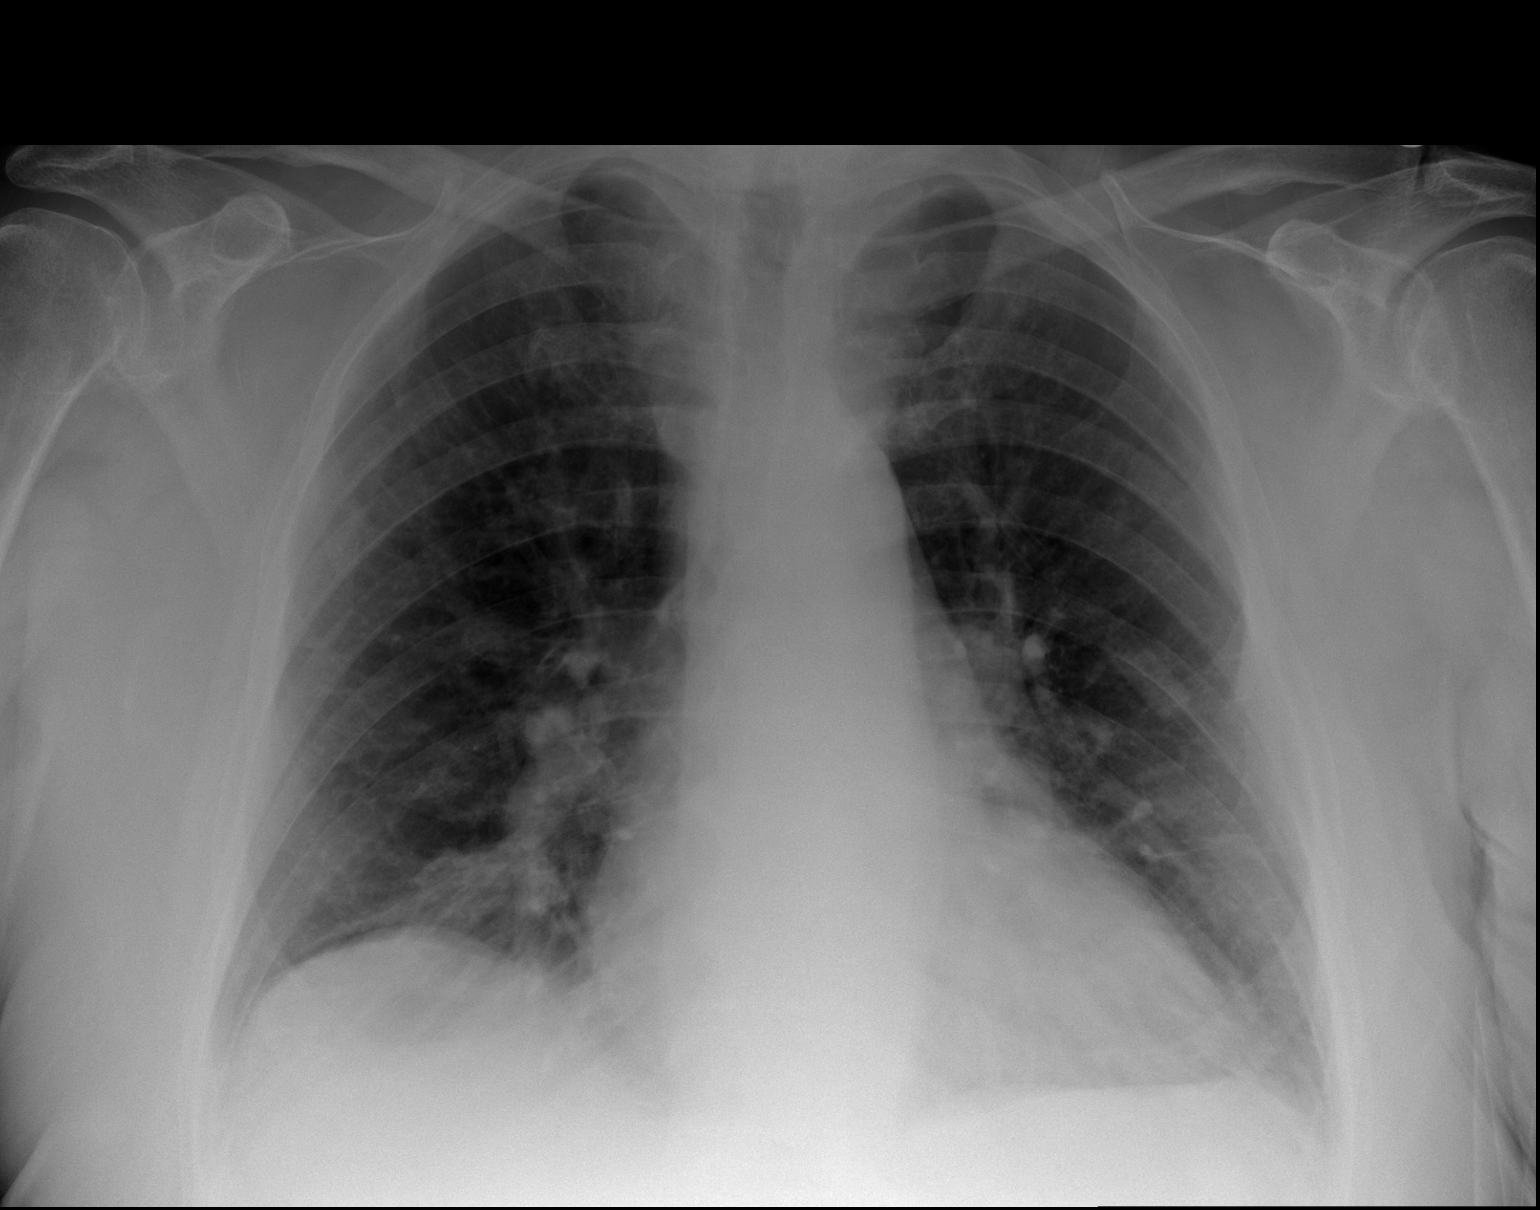

[2 of 2 positions shown; findings below may reference images not displayed]

FINDINGS: Increased interstitial markings. No focal consolidation. No pleural
effusion or pneumothorax.

The heart is top-normal in size.

Osseous spurring overlying the lower thoracic spine on the lateral
view.
IMPRESSION: No evidence of acute cardiopulmonary disease.

## 2019-02-06 IMAGING — CT CT ABD-PELV W/ CM
2 of 5 series · 16 of 46 positions shown, 18 images · IV contrast (ISOVUE)
Comparison: 06/05/2017 CT abdomen and pelvis

CLINICAL DATA: 84 y/o M; 1 week of constipation and abdominal
distention. History of right colon cancer hemicolectomy as well as
prostate cancer post radiation and chemotherapy.

EXAM:
CT ABDOMEN AND PELVIS WITH CONTRAST
TECHNIQUE: Multidetector CT imaging of the abdomen and pelvis was performed
using the standard protocol following bolus administration of
intravenous contrast.
CONTRAST:  100mL [id number] IOPAMIDOL ([id number]) INJECTION 61%

[Series 2: axial st · axial · 0.79mm/px · z∈[+1239,+1669]mm · 13 of 100 slices shown, 15 images]
[im 7/100  soft-tissue]
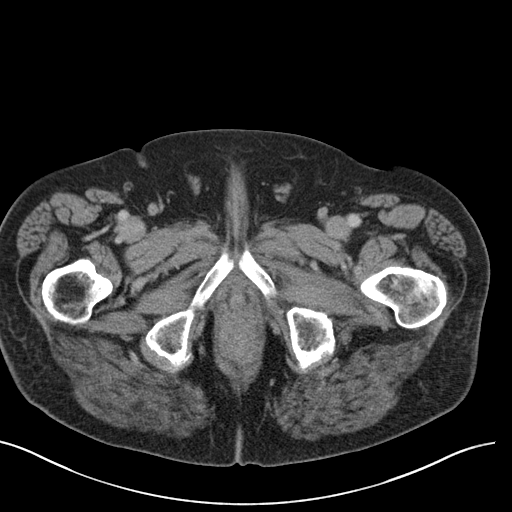
[im 7/100  bone]
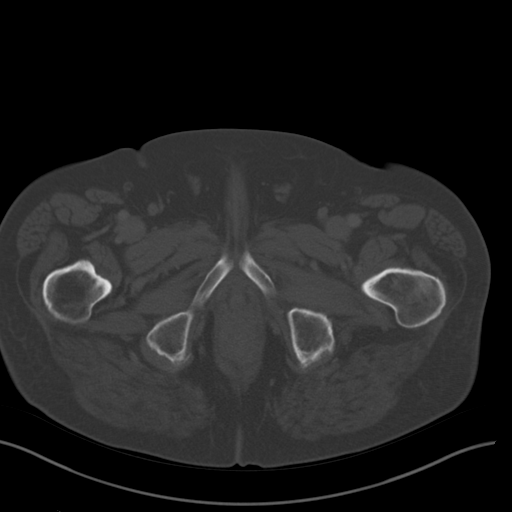
[im 14/100  soft-tissue]
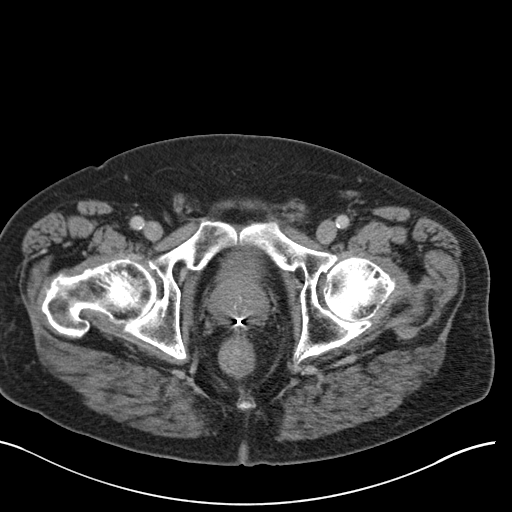
[im 20/100  soft-tissue]
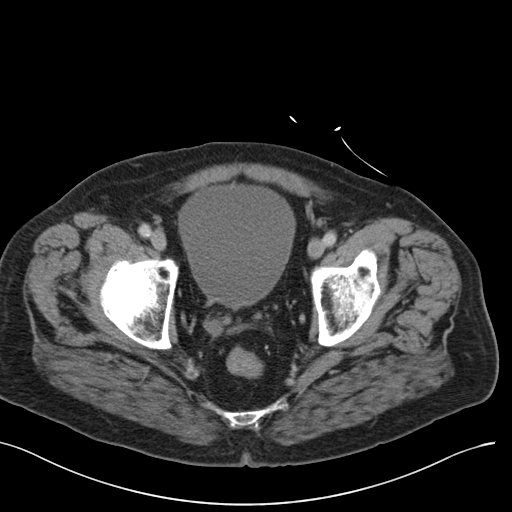
[im 27/100  soft-tissue]
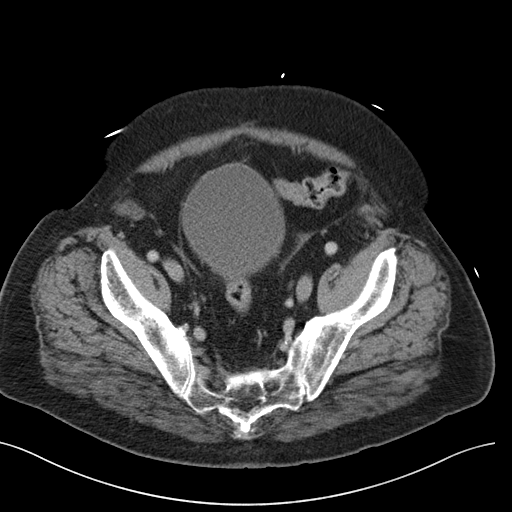
[im 34/100  soft-tissue]
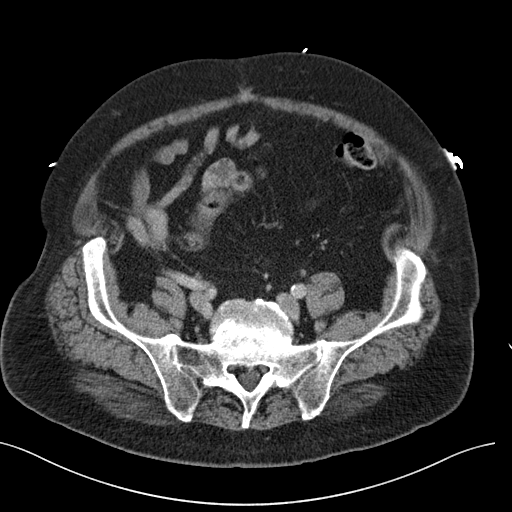
[im 40/100  soft-tissue]
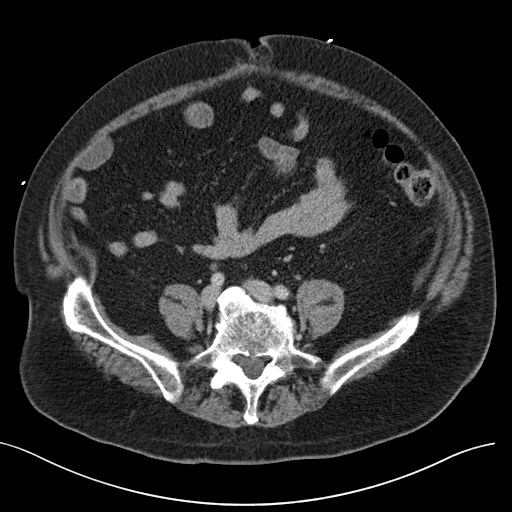
[im 53/100  soft-tissue]
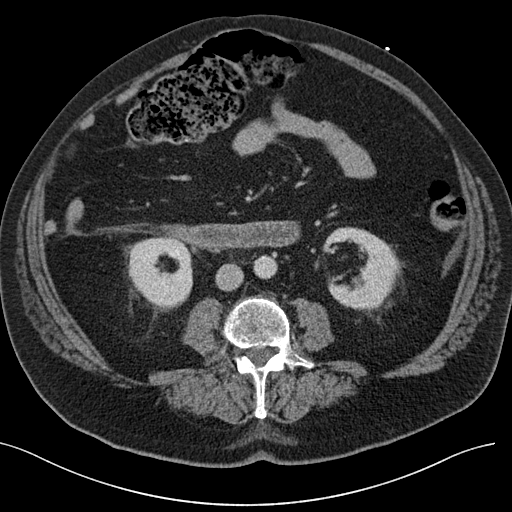
[im 60/100  soft-tissue]
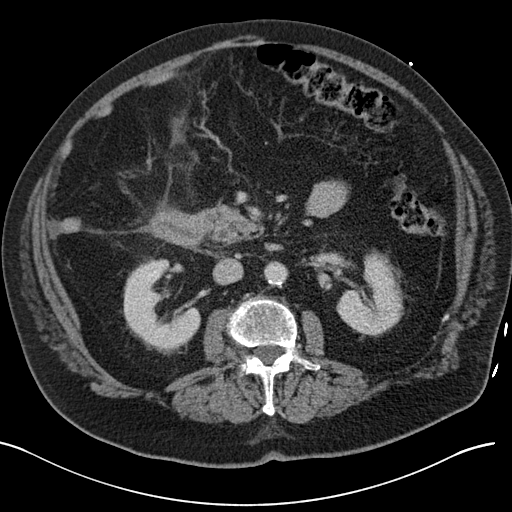
[im 67/100  soft-tissue]
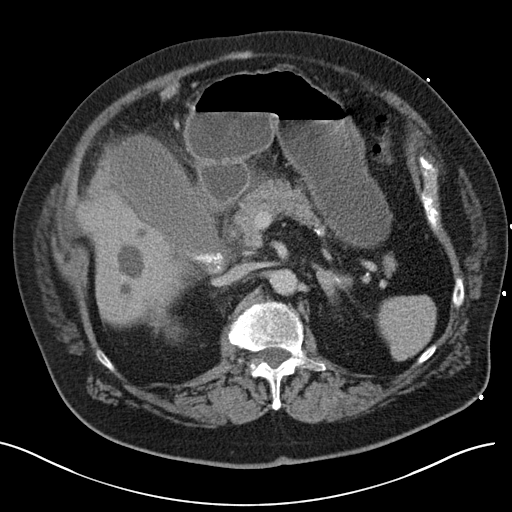
[im 67/100  bone]
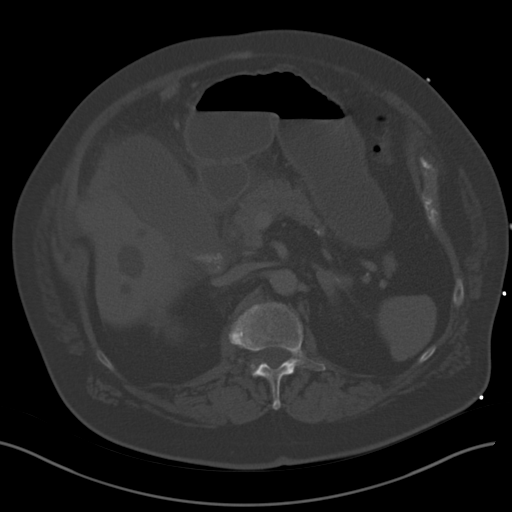
[im 73/100  soft-tissue]
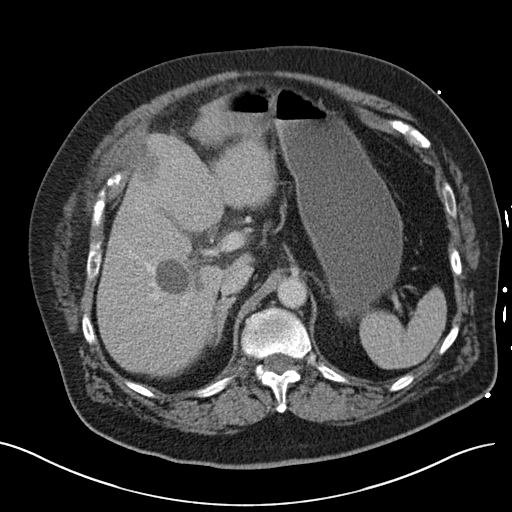
[im 80/100  soft-tissue]
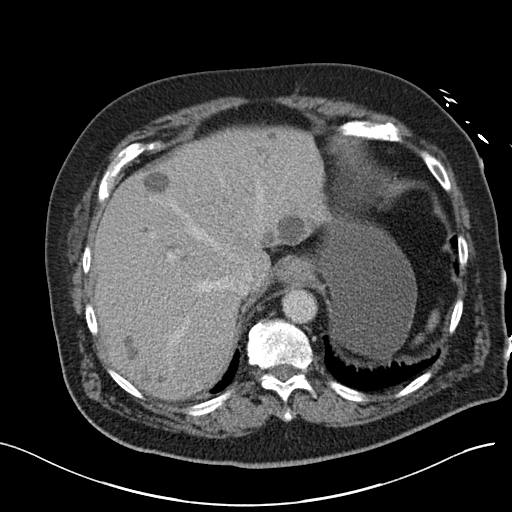
[im 86/100  soft-tissue]
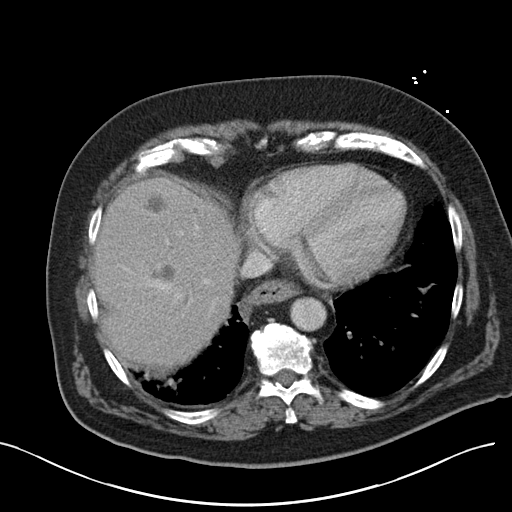
[im 93/100  soft-tissue]
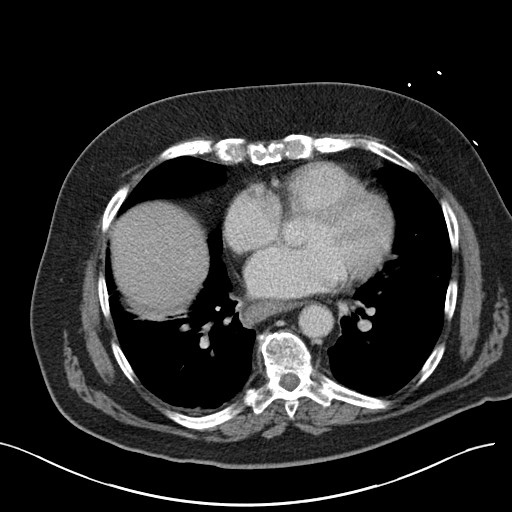

[Series 5: coronal st · coronal · 0.80mm/px · 3 of 118 slices shown]
[im 40/118  soft-tissue]
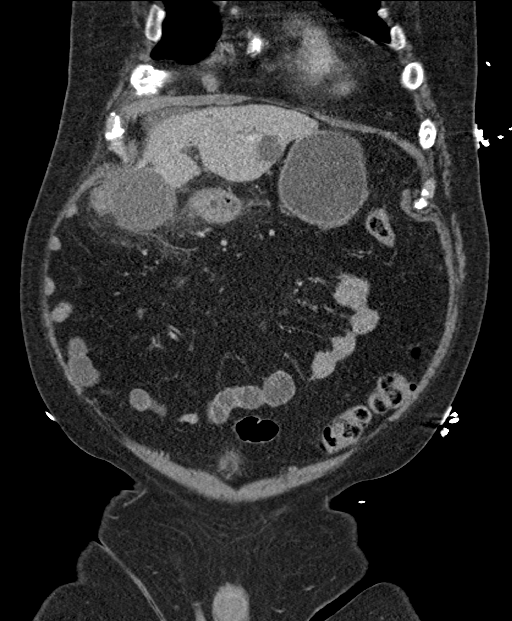
[im 53/118  soft-tissue]
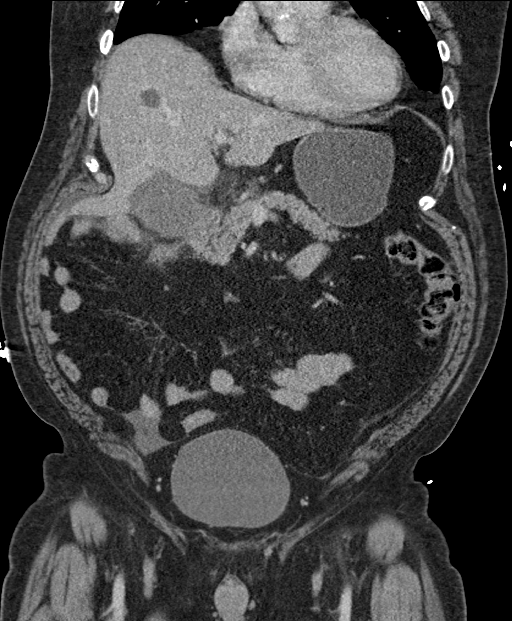
[im 66/118  soft-tissue]
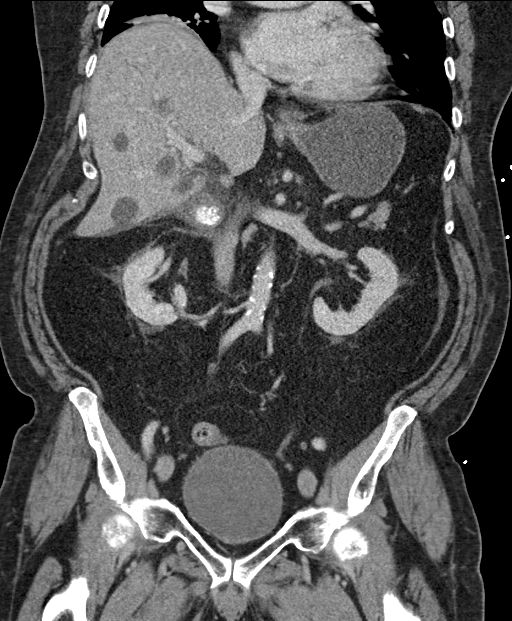

[16 of 46 positions shown; findings below may reference images not displayed]

FINDINGS: Lower chest: Mild coronary artery calcification.

Hepatobiliary: Numerous stable hepatic cyst. Interval development of
gallbladder distention, pericholecystic fluid, and gallbladder wall
thickening is likely representing acute cholecystitis.
Cholelithiasis. No biliary ductal dilatation.

Pancreas: Unremarkable. No pancreatic ductal dilatation or
surrounding inflammatory changes.

Spleen: Normal in size without focal abnormality.

Adrenals/Urinary Tract: Adrenal glands are unremarkable. Kidneys are
normal, without renal calculi, focal lesion, or hydronephrosis.
Bladder is unremarkable.

Stomach/Bowel: Right hemicolectomy. Patent enteric colonic
anastomosis. No obstructive or inflammatory changes of the bowel.

Vascular/Lymphatic: Aortic atherosclerosis. No enlarged abdominal or
pelvic lymph nodes.

Reproductive: Prostate seeds noted.

Other: Small volume of ascites.

Musculoskeletal: No fracture is seen. Mild right hip osteoarthrosis
and multilevel lumbar spondylosis.
IMPRESSION: 1. Interval development of gallbladder wall thickening and
pericholecystic fluid compatible with acute cholecystitis.
Cholelithiasis. No biliary ductal dilatation.
2. Multiple hepatic cysts.
3. Aortic atherosclerosis.

By: Bisrat Christenson M.D.

## 2019-02-20 ENCOUNTER — Ambulatory Visit (INDEPENDENT_AMBULATORY_CARE_PROVIDER_SITE_OTHER): Payer: Medicare Other | Admitting: Orthopaedic Surgery

## 2019-02-20 ENCOUNTER — Encounter: Payer: Self-pay | Admitting: Orthopaedic Surgery

## 2019-02-20 ENCOUNTER — Other Ambulatory Visit: Payer: Self-pay

## 2019-02-20 DIAGNOSIS — M1712 Unilateral primary osteoarthritis, left knee: Secondary | ICD-10-CM

## 2019-02-20 MED ORDER — LIDOCAINE HCL 1 % IJ SOLN
3.0000 mL | INTRAMUSCULAR | Status: AC | PRN
  Administered 2019-02-20: 3 mL

## 2019-02-20 MED ORDER — HYALURONAN 88 MG/4ML IX SOSY
88.0000 mg | PREFILLED_SYRINGE | INTRA_ARTICULAR | Status: AC | PRN
  Administered 2019-02-20: 88 mg via INTRA_ARTICULAR

## 2019-02-20 NOTE — Progress Notes (Signed)
   Procedure Note  Patient: Julian Brown             Date of Birth: 28-Nov-1932           MRN: [id number]             Visit Date: 02/20/2019  HPI Mr. Ralph returns today for left knee Monovisc injection.  He had a cortisone injection in the knee back in July states helped some.  Also has complained of some low back pain no radicular symptoms.  He is having no waking pain.  States to some soreness in his low back.  Review of systems: No fevers chills see HPI otherwise negative.  Physical exam: Left knee full extension full flexion tenderness along medial joint line no abnormal warmth erythema or effusion.  Procedures: Visit Diagnoses:  1. Unilateral primary osteoarthritis, left knee     Large Joint Inj: L knee on 02/20/2019 9:59 AM Indications: pain Details: 22 G 1.5 in needle, anterolateral approach  Arthrogram: No  Medications: 3 mL lidocaine 1 %; 88 mg Hyaluronan 88 MG/4ML Outcome: tolerated well, no immediate complications Procedure, treatment alternatives, risks and benefits explained, specific risks discussed. Consent was given by the patient. Immediately prior to procedure a time out was called to verify the correct patient, procedure, equipment, support staff and site/side marked as required. Patient was prepped and draped in the usual sterile fashion.     Plan: He is given some back exercises he can try  His condition worsens in regards to his back as normal as follow-up for exactly when.  In regards to the left knee he understands that he needs to wait 3 months between cortisone injections in 6 months between supplemental injections.  Follow-up with [place] on as-needed basis.

## 2019-04-10 ENCOUNTER — Telehealth: Payer: Self-pay | Admitting: *Deleted

## 2019-04-10 ENCOUNTER — Other Ambulatory Visit: Payer: Self-pay | Admitting: *Deleted

## 2019-04-10 DIAGNOSIS — R911 Solitary pulmonary nodule: Secondary | ICD-10-CM

## 2019-04-10 NOTE — Telephone Encounter (Signed)
Called patient left message not to come for CT appt scheduled 11/6. Left number to call back [phone].  Sent message to [facility] and [place] [facility]riage and to put in new order call patient to reschedule at different facility.

## 2019-04-16 ENCOUNTER — Ambulatory Visit
Admission: RE | Admit: 2019-04-16 | Discharge: 2019-04-16 | Disposition: A | Discharge status: Home / Self Care | Payer: Medicare Other | Source: Ambulatory Visit | Attending: Internal Medicine | Admitting: Internal Medicine

## 2019-04-16 DIAGNOSIS — R911 Solitary pulmonary nodule: Secondary | ICD-10-CM

## 2019-04-19 ENCOUNTER — Inpatient Hospital Stay: Admission: RE | Admit: 2019-04-19 | Payer: Medicare Other | Source: Ambulatory Visit

## 2019-04-22 ENCOUNTER — Ambulatory Visit: Payer: Medicare Other | Admitting: Internal Medicine

## 2019-04-23 ENCOUNTER — Other Ambulatory Visit (INDEPENDENT_AMBULATORY_CARE_PROVIDER_SITE_OTHER): Payer: Medicare Other

## 2019-04-23 ENCOUNTER — Other Ambulatory Visit: Payer: Self-pay

## 2019-04-23 ENCOUNTER — Ambulatory Visit (INDEPENDENT_AMBULATORY_CARE_PROVIDER_SITE_OTHER): Payer: Medicare Other | Admitting: Internal Medicine

## 2019-04-23 ENCOUNTER — Telehealth: Payer: Self-pay | Admitting: Internal Medicine

## 2019-04-23 ENCOUNTER — Encounter: Payer: Self-pay | Admitting: Internal Medicine

## 2019-04-23 VITALS — BP 124/78 | HR 73 | Ht 71.0 in | Wt 220.8 lb

## 2019-04-23 DIAGNOSIS — J449 Chronic obstructive pulmonary disease, unspecified: Secondary | ICD-10-CM

## 2019-04-23 DIAGNOSIS — J309 Allergic rhinitis, unspecified: Secondary | ICD-10-CM

## 2019-04-23 DIAGNOSIS — R490 Dysphonia: Secondary | ICD-10-CM

## 2019-04-23 DIAGNOSIS — J441 Chronic obstructive pulmonary disease with (acute) exacerbation: Secondary | ICD-10-CM

## 2019-04-23 LAB — CBC WITH DIFFERENTIAL/PLATELET
Basophils Absolute: 0.1 10*3/uL (ref 0.0–0.1)
Basophils Relative: 0.9 % (ref 0.0–3.0)
Eosinophils Absolute: 0.1 10*3/uL (ref 0.0–0.7)
Eosinophils Relative: 0.6 % (ref 0.0–5.0)
HCT: 45.7 % (ref 39.0–52.0)
Hemoglobin: 15.6 g/dL (ref 13.0–17.0)
Lymphocytes Relative: 24.2 % (ref 12.0–46.0)
Lymphs Abs: 2 10*3/uL (ref 0.7–4.0)
MCHC: 34 g/dL (ref 30.0–36.0)
MCV: 98.3 fl (ref 78.0–100.0)
Monocytes Absolute: 1.1 10*3/uL — ABNORMAL HIGH (ref 0.1–1.0)
Monocytes Relative: 12.8 % — ABNORMAL HIGH (ref 3.0–12.0)
Neutro Abs: 5.1 10*3/uL (ref 1.4–7.7)
Neutrophils Relative %: 61.5 % (ref 43.0–77.0)
Platelets: 303 10*3/uL (ref 150.0–400.0)
RBC: 4.65 Mil/uL (ref 4.22–5.81)
RDW: 13.7 % (ref 11.5–15.5)
WBC: 8.3 10*3/uL (ref 4.0–10.5)

## 2019-04-23 MED ORDER — DOXYCYCLINE HYCLATE 100 MG PO TABS: 100.0000 mg | ORAL_TABLET | Freq: Two times a day (BID) | ORAL | 0 refills | Status: DC

## 2019-04-23 MED ORDER — PREDNISONE 10 MG PO TABS: ORAL_TABLET | ORAL | 0 refills | Status: DC

## 2019-04-23 NOTE — Patient Instructions (Addendum)
Multiple lung nodules on CT - stable jan 2019 to Nov 2019 with new 32mmLUL nodule -> stable Nov 2020  - benign  Plan  - no further ct  Stage 2 moderate COPD by GOLD classification (HCC) Nocturnal hypoxemia  - continue spiriva and breo scheduled  Plan -  Continue  o2 at night  - retest walk test on room air - if abnormal, can try to qualify for innogen - defere flu shot and pneumovax 04/23/2019 due to flare up - blood test for IgE, cbc with diff, and blood allergy test and alpha 1   COPD Flare up - might be going on currently  Plan  - Take doxycycline 100mg  po twice daily x 5 days; take after meals and avoid sunlight - Please take prednisone 40 mg x1 day, then 30 mg x1 day, then 20 mg x1 day, then 10 mg x1 day, and then 5 mg x1 day and stop  Hoarse voice - following surgery dec 2018  Plan  - re-refer ENT  followup  6 weeks or sooner if needed

## 2019-04-23 NOTE — Progress Notes (Signed)
OV 04/23/2019  Subjective:  Patient ID: Julian Brown, male , DOB: 09-24-1932 , age 85 y.o. , MRN: [id number] , ADDRESS: [address] [place] [id number]   04/23/2019 -   Chief Complaint  Patient presents with  . Follow-up    Pt states he has been doing well since last visit. Pt's wife says that pt has been coughing a lot with white to yellow phlegm and has SOB with activities.   COPD and nodule follow-up  HPI Julian Brown 85 y.o. -presents with his wife.  He is not giving much of a history.  Wife is giving most of the history.  She is very concerned.  She says that in the summer 2020 he decompensated with increased cough and congestion.  Saw primary care physician x-ray was normal.  Does not recollect if he got antibiotics and steroids.  Now for the last few weeks he has had increased cough, congestion, increased urine volume but no change in sputum color or wheezing or shortness of breath.  COPD CAT score is 13 but this is filled by him.  She is reporting weight more symptoms.  He is compliant with the Spiriva and Breo.  In addition she is asking about the impact of seasonal allergies on this and is requesting allergy testing.  He is using nighttime oxygen.  She is asking about having a portable indigent system.  Walking desaturation test was abnormal a few years ago.  But she says the canisters are all heavy.  He is not using portable oxygen.  Do not know why.  They are interested in this  Lung nodule.  This is stable: Results are below.  New issue: Reporting hoarseness of voice since gallbladder surgery in December 2018.  Says saw ENT over a year ago and was told that the vocal cords were red but no nodules or any other abnormalities.  Does not remember any strictures.     CAT COPD Symptom & Quality of Life Score (GSK trademark) 0 is no burden. 5 is highest burden 04/23/2019   Never Cough -> Cough all the time 3  No phlegm in chest -> Chest is full of phlegm 2   No chest tightness -> Chest feels very tight 1  No dyspnea for 1 flight stairs/hill -> Very dyspneic for 1 flight of stairs 4  No limitations for ADL at home -> Very limited with ADL at home 0  Confident leaving home -> Not at all confident leaving home 0  Sleep soundly -> Do not sleep soundly because of lung condition 0  Lots of Energy -> No energy at all 3  TOTAL Score (max 40)  13      Results for Julian Brown (MRN [id number]) as of 11/20/2017 11:02  Ref. Range 11/20/2017 09:12  FEV1-Post Latest Units: L 1.43  FEV1-%Pred-Post Latest Units: % 54  FEV1-%Change-Post Latest Units: % 5  Post FEV1/FVC ratio Latest Units: % 61  FEV1FVC-%Change-Post Latest Units: % 0  Results for Julian Brown (MRN [id number]) as of 11/20/2017 11:02  Ref. Range 11/20/2017 09:12  DLCO unc Latest Units: ml/min/mmHg 12.99  DLCO unc % pred Latest Units: % 41    Simple office walk 185 feet x  3 laps goal with forehead probe 10/25/2017  11/20/2017  04/23/2019  O2 used Room air Room air Room air  Number laps completed 2 and desaturated 2 and desaturated 1 and deaurates  Comments about pace Normal with limp Normal  pace normla pace  Resting Pulse Ox/HR 96% and 63/min 95% and 61/min 97% and 75/min  Final Pulse Ox/HR 84% and 81/min 87% and 85 at 2nd lap 88% and and 89/min  Desaturated </= 88% yes yes   Desaturated <= 3% points yes yes   Got Tachycardic >/= 90/min no no   Symptoms at end of test No complaints No complaints dyspnea  Miscellaneous comments Corrected with 2L O2 correcte with 2nd lap mild      IMPRESSION: CT chest nov 2020 1. Stable tiny left upper lobe pulmonary nodules. Interval 1 year stability consistent with benign etiology. No new or progressive findings on today's study. 2. Stable bilateral thyroid nodules. 3. Aortic Atherosclerosis (ICD10-I70.0) and Emphysema (ICD10-J43.9).   Electronically Signed   By: Misty Stanley M.D.   On: 04/16/2019 13:06   ROS - per HPI     has  a past medical history of Acute gangrenous cholecystitis s/p lap cholecystectomy 06/08/2017 (06/08/2017), Arthritis, ED (erectile dysfunction), Hyperlipemia, Hypertension, Prostate cancer ([place]), and Wears glasses.   reports that he quit smoking about 32 years ago. His smoking use included cigarettes. He has a 36.00 pack-year smoking history. He has never used smokeless tobacco.  Past Surgical History:  Procedure Laterality Date  . APPENDECTOMY    . CARPAL TUNNEL RELEASE  5/12   lt  . CARPAL TUNNEL RELEASE  12/13/2011   Procedure: CARPAL TUNNEL RELEASE;  Surgeon: [name]., MD;  Location: [facility];  Service: Orthopedics;  Laterality: Right;  and inject right wrist  . CHOLECYSTECTOMY N/A 06/08/2017   Procedure: LAPAROSCOPIC CHOLECYSTECTOMY WITH LYSIS OF ADHESIONS;  Surgeon: [name], MD;  Location: [facility];  Service: General;  Laterality: N/A;  . [place]   hemicolectomy-rt-ca  . COLONOSCOPY      Allergies  Allergen Reactions  . Penicillins Swelling    Has patient had a PCN reaction causing immediate rash, facial/tongue/throat swelling, SOB or lightheadedness with hypotension: no (lip swelling "as soon as the pill touched my mouth so I didn't swallow it") Has patient had a PCN reaction causing severe rash involving mucus membranes or skin necrosis: no Has patient had a PCN reaction that required hospitalization: no Has patient had a PCN reaction occurring within the last 10 years: no If all of the above answers are "NO", then may proceed with Ceph    Immunization History  Administered Date(s) Administered  . Influenza, High Dose Seasonal PF 02/11/2017, 04/18/2018  . Pneumococcal Conjugate-13 11/06/2014    No family history on file.   Current Outpatient Medications:  .  amLODipine (NORVASC) 10 MG tablet, Take 10 mg by mouth daily., Disp: , Rfl:  .  aspirin EC 81 MG tablet, Take 81 mg by mouth daily., Disp: , Rfl:  .  BREO ELLIPTA 100-25  MCG/INH AEPB, INHALE 1 PUFF INTO THE LUNGS ONCE DAILY., Disp: 60 each, Rfl: 0 .  cholecalciferol (VITAMIN D) 1000 UNITS tablet, Take 2,000 Units by mouth daily. , Disp: , Rfl:  .  Multiple Vitamins-Minerals (PRESERVISION AREDS 2) CAPS, Take by mouth., Disp: , Rfl:  .  olmesartan-hydrochlorothiazide (BENICAR HCT) 40-12.5 MG tablet, Take 1 tablet by mouth daily. Please order from [facility], Disp: , Rfl:  .  rosuvastatin (CRESTOR) 10 MG tablet, Take 10 mg by mouth daily., Disp: , Rfl:  .  SPIRIVA RESPIMAT 1.25 MCG/ACT AERS, USE 2 PUFFS EACH DAY, Disp: 4 g, Rfl: 0 .  doxycycline (VIBRA-TABS) 100 MG tablet, Take 1 tablet (  100 mg total) by mouth 2 (two) times daily., Disp: 10 tablet, Rfl: 0 .  predniSONE (DELTASONE) 10 MG tablet, Take 4tabs x1day, 3tabs x1day, 2tabs x1day, 1tab x1day, 0.5tab x1day then stop, Disp: 11 tablet, Rfl: 0      Objective:   Vitals:   04/23/19 1113  BP: 124/78  Pulse: 73  SpO2: 93%  Weight: 220 lb 12.8 oz (100.2 kg)  Height: 5\' 11"  (1.803 m)    Estimated body mass index is 30.8 kg/m as calculated from the following:   Height as of this encounter: 5\' 11"  (1.803 m).   Weight as of this encounter: 220 lb 12.8 oz (100.2 kg).  @WEIGHTCHANGE @  [employer]   04/23/19 1113  Weight: 220 lb 12.8 oz (100.2 kg)     Physical Exam  General Appearance:    Alert, cooperative, no distress, appears stated age - yes , Deconditioned looking - no , OBESE  - yes, Sitting on Wheelchair -  no  Head:    Normocephalic, without obvious abnormality, atraumatic  Eyes:    PERRL, conjunctiva/corneas clear,  Ears:    Normal TM's and external ear canals, both ears  Nose:   Nares normal, septum midline, mucosa normal, no drainage    or sinus tenderness. OXYGEN ON  - ra . Patient is @ no   Throat:   Lips, mucosa, and tongue normal; teeth and gums normal. Cyanosis on lips - no  Neck:   Supple, symmetrical, trachea midline, no adenopathy;    thyroid:  no enlargement/tenderness/nodules; no  carotid   bruit or JVD  Back:     Symmetric, no curvature, ROM normal, no CVA tenderness  Lungs:     Distress - no , Wheeze no, Barrell Chest - no, Purse lip breathing - no, Crackles - no   Chest Wall:    No tenderness or deformity.    Heart:    Regular rate and rhythm, S1 and S2 normal, no rub   or gallop, Murmur - no  Breast Exam:    NOT DONE  Abdomen:     Soft, non-tender, bowel sounds active all four quadrants,    no masses, no organomegaly. Visceral obesity - yes  Genitalia:   NOT DONE  Rectal:   NOT DONE  Extremities:   Extremities - normal, Has Cane - no, Clubbing - no, Edema - no  Pulses:   2+ and symmetric all extremities  Skin:   Stigmata of Connective Tissue Disease - no  Lymph nodes:   Cervical, supraclavicular, and axillary nodes normal  Psychiatric:  Neurologic:   Pleasant - yes, Anxious - no, Flat affect - no  CAm-ICU - neg, Alert and Oriented x 3 - yes, Moves all 4s - yes, Speech - normal, Cognition - intact           Assessment:       ICD-10-CM   1. Stage 2 moderate COPD by GOLD classification (HCC)  J44.9 Alpha-1 antitrypsin phenotype    CBC w/Diff  2. Allergic rhinitis, unspecified seasonality, unspecified trigger  J30.9 IgE    Allergy/RAST panel  3. Hoarseness of voice  R49.0 Ambulatory referral to ENT  4. COPD with acute exacerbation ([place])  J44.1        Plan:     Patient Instructions  Multiple lung nodules on CT - stable jan 2019 to Nov 2019 with new 40mmLUL nodule -> stable Nov 2020  - benign  Plan  - no further ct  Stage 2 moderate COPD  by GOLD classification ([place]) Nocturnal hypoxemia  - continue spiriva and breo scheduled  Plan -  Continue  o2 at night  - retest walk test on room air - if abnormal, can try to qualify for innogen - defere flu shot and pneumovax 04/23/2019 due to flare up - blood test for IgE, cbc with diff, and blood allergy test and alpha 1   COPD Flare up - might be going on currently  Plan  - Take doxycycline  100mg  po twice daily x 5 days; take after meals and avoid sunlight - Please take prednisone 40 mg x1 day, then 30 mg x1 day, then 20 mg x1 day, then 10 mg x1 day, and then 5 mg x1 day and stop  Hoarse voice - following surgery dec 2018  Plan  - re-refer ENT  followup  6 weeks or sooner if needed     SIGNATURE    Dr. Brand Males, M.D., F.C.C.P,  Pulmonary and Critical Care Medicine Staff Physician, [place] Director - Interstitial Lung Disease  Program  Pulmonary [place] at [place], [place], [address]  Pager: [phone], If no answer or between  15:00h - 7:00h: call [phone] Telephone: [phone]  11:57 AM 04/23/2019

## 2019-04-23 NOTE — Telephone Encounter (Signed)
FYI MR. Pt is already on 2L of conitnuous oxt=ygen with home fill, according to [facility].       Mariann Barter sent to [place], Rachell Cipro, Melissa; [place], Jeanie Cooks, Stephani Police        I have called and left staff message. Patient was ordered to be on 2lpm cont out of the hospital in Jan 2019. He has a homefill unit. We can send a request for him to be reviewed for a poc, but it is not the inogen. It would be one he would pull like a suitcase and it is heavier than the homefill tanks.            . Type Date User Summary Attachment  General 04/23/2019 12:40 PM Vella Kohler D - -  Note   CM sent to [employer].          . Type Date User Summary Attachment  Provider Comments 04/23/2019 12:21 PM [place], Waldemar Dickens, CMA Provider Comments -  Note   Route (nasal cannula OR mask): nasal cannula Liter Flow: 2L Frequency (continuous with stationary and portable oxygen unit needed OR only at night): continuous with stationary and portable unit and at night Length of Need: Lifetime Was this based off an ONO?: no DME: [employer] Oxygen Conserving Device (Yes or No): yes Type of Oxygen: Gas If new O2 start, please evaluate and titrate for best fit POC or portable O2

## 2019-04-25 NOTE — Telephone Encounter (Signed)
They are wanting innogen portable or light portable (please check with them). He desaturated with walking

## 2019-04-25 NOTE — Telephone Encounter (Signed)
Order has been placed for the inogen based off of pt's last OV. Nothing further needed.

## 2019-04-27 LAB — RESPIRATORY ALLERGY PROFILE REGION II ~~LOC~~

## 2019-04-27 LAB — INTERPRETATION:

## 2019-04-27 LAB — ALPHA-1 ANTITRYPSIN PHENOTYPE: A-1 Antitrypsin, Ser: 148 mg/dL (ref 83–199)

## 2019-05-14 ENCOUNTER — Telehealth: Payer: Self-pay | Admitting: Internal Medicine

## 2019-05-14 NOTE — Telephone Encounter (Signed)
Patient's wife is calling in for lab results done on 04/23/19. Please advise.

## 2019-05-18 NOTE — Telephone Encounter (Signed)
bloood allergy panel, IgE and alpha 1  - all normal

## 2019-05-20 NOTE — Telephone Encounter (Signed)
Spoke with the pt's spouse and notified of results  She verbalized understanding  Nothing further needed

## 2019-05-23 ENCOUNTER — Ambulatory Visit (INDEPENDENT_AMBULATORY_CARE_PROVIDER_SITE_OTHER): Payer: Medicare Other | Admitting: Pulmonary Disease

## 2019-05-23 ENCOUNTER — Other Ambulatory Visit: Payer: Self-pay

## 2019-05-23 ENCOUNTER — Encounter: Payer: Self-pay | Admitting: Pulmonary Disease

## 2019-05-23 DIAGNOSIS — R059 Cough, unspecified: Secondary | ICD-10-CM

## 2019-05-23 DIAGNOSIS — R05 Cough: Secondary | ICD-10-CM | POA: Diagnosis not present

## 2019-05-23 DIAGNOSIS — J449 Chronic obstructive pulmonary disease, unspecified: Secondary | ICD-10-CM

## 2019-05-23 MED ORDER — PREDNISONE 10 MG PO TABS: ORAL_TABLET | ORAL | 0 refills | Status: DC

## 2019-05-23 MED ORDER — LEVOFLOXACIN 500 MG PO TABS: 500.0000 mg | ORAL_TABLET | Freq: Every day | ORAL | 0 refills | Status: DC

## 2019-05-23 NOTE — Addendum Note (Signed)
Addended by: Valerie Salts on: 05/23/2019 03:11 PM   Modules accepted: Orders

## 2019-05-23 NOTE — Assessment & Plan Note (Signed)
Plan: Sputum cultures today We will treat as acute bronchitis/COPD exacerbation Levaquin today Prednisone taper today

## 2019-05-23 NOTE — Assessment & Plan Note (Signed)
Plan: Levaquin today Prednisone taper today Sputum cultures Continue Breo Ellipta 100 continue Spiriva Respimat 1.25 Keep follow-up with our office on 06/12/2019

## 2019-05-23 NOTE — Progress Notes (Signed)
Virtual Visit via Telephone Note  I connected with Julian Brown on 05/23/19 at  3:00 PM EST by telephone and verified that I am speaking with the correct person using two identifiers.  Location: Patient: Home Provider: [facility] - [id number], [place], [place], [place] [address]   I discussed the limitations, risks, security and privacy concerns of performing an evaluation and management service by telephone and the availability of in person appointments. I also discussed with the patient that there may be a patient responsible charge related to this service. The patient expressed understanding and agreed to proceed.  Patient consented to consult via telephone: Yes People present and their role in pt care: Pt   History of Present Illness:  85 year old male former smoker followed in our office for COPD  Past medical history: Smoking history: Former smoker.  Quit 1988.  36-pack-year smoking history. Maintenance: Breo Ellipta 100, Spiriva Respimat Patient of Dr. Chase Caller  Chief complaint: Chest tightness and cough, acute visit   85 year old male former smoker followed in our office for COPD.  Patient was last seen on 04/23/2019 by Dr. Chase Caller.  At that last office visit patient was continued on Spiriva Respimat and Breo Ellipta.  He was instructed to continue oxygen levels at night.  He also had blood work completed for IgE, CBC with differential as well as a blood allergy test and alpha-1.  He had a history of small pulmonary nodules that are felt to be benign and no need to further follow on CT imaging.  He was referred to ENT for for voice hoarseness.  He was also treated as a COPD exacerbation with doxycycline as well as a prednisone taper.  Patient also completed follow-up with urology on 05/06/2019 for a malignant tumor of his prostate.  Patient contacted our office on 05/23/2019 reporting chest tightness and cough.  Patient reports he continues to have thick  mucus.  He feels that he has a wet chest cold.  He does have a productive cough with clear mucus.  It is hard for him to clear the mucus on his own.  He did have some improvement when he was treated with doxycycline for 5 days as well as prednisone in November/2020.  Patient has no sputum cultures on file.  Patient reports has been afebrile.  Patient reports no other known sick contacts.  No recent exposures to Covid.  Denies body aches, chills, loss of sense of smell or taste.  Patient is fatigued.  Patient did confirm that he has an upcoming ENT appointment with Dr. Benjamine Mola on 05/29/2019.  Observations/Objective:  05/23/2019 - temp - 97.4  04/23/2019-RAST panel-no elevations, IgE 15 04/23/2019-CBC with differential-eosinophils relative 0.6, eosinophils absolute 0.1 04/23/2019-alpha-1-148, phenotype PI*MM  04/16/2019-CT chest without contrast-stable tiny left upper lobe pulmonary nodules interval 1 year stability consistent with benign etiology, centrilobular emphysema, stable chronic atelectasis or scarring right middle lobe, stable bilateral thyroid nodules, aortic arthrosclerosis  11/20/2017-pulmonary function test-FVC 2.23 (59% predicted), postbronchodilator ratio 61, postbronchodilator FEV1 1.43 (54% predicted), no bronchodilator response, DLCO 12.99 (41% predicted)  Social History   Tobacco Use  Smoking Status Former Smoker  . Packs/day: 1.00  . Years: 36.00  . Pack years: 36.00  . Types: Cigarettes  . Quit date: 12/09/1986  . Years since quitting: 32.4  Smokeless Tobacco Never Used   Immunization History  Administered Date(s) Administered  . Influenza, High Dose Seasonal PF 02/11/2017, 04/18/2018  . Pneumococcal Conjugate-13 11/06/2014      Assessment  and Plan:  Stage 2 moderate COPD by GOLD classification ([place]) Plan: Levaquin today Prednisone taper today Sputum cultures Continue Breo Ellipta 100 continue Spiriva Respimat 1.25 Keep follow-up with our office on  06/12/2019   Cough Plan: Sputum cultures today We will treat as acute bronchitis/COPD exacerbation Levaquin today Prednisone taper today   Follow Up Instructions:  Return in about 20 days (around 06/12/2019), or if symptoms worsen or fail to improve, for Follow up with Wyn Quaker FNP-C.   I discussed the assessment and treatment plan with the patient. The patient was provided an opportunity to ask questions and all were answered. The patient agreed with the plan and demonstrated an understanding of the instructions.   The patient was advised to call back or seek an in-person evaluation if the symptoms worsen or if the condition fails to improve as anticipated.  I provided 25 minutes of non-face-to-face time during this encounter.   Lauraine Rinne, NP

## 2019-05-23 NOTE — Patient Instructions (Addendum)
You were seen today by Lauraine Rinne, NP  for:   1. Stage 2 moderate COPD by GOLD classification (HCC)  - levofloxacin (LEVAQUIN) 500 MG tablet; Take 1 tablet (500 mg total) by mouth daily.  Dispense: 7 tablet; Refill: 0 - predniSONE (DELTASONE) 10 MG tablet; 4 tabs for 2 days, then 3 tabs for 2 days, 2 tabs for 2 days, then 1 tab for 2 days, then stop  Dispense: 20 tablet; Refill: 0  Breo Ellipta 100 >>> Take 1 puff daily in the morning right when you wake up >>>Rinse your mouth out after use >>>This is a daily maintenance inhaler, NOT a rescue inhaler >>>Contact our office if you are having difficulties affording or obtaining this medication >>>It is important for you to be able to take this daily and not miss any doses   Spiriva Respimat 1.25 >>> 2 puffs daily >>> Do this every day >>>This is not a rescue inhaler  Note your daily symptoms > remember "red flags" for COPD:   >>>Increase in cough >>>increase in sputum production >>>increase in shortness of breath or activity  intolerance.   If you notice these symptoms, please call the office to be seen.    2. Cough  We will test your sputum: Respiratory sputum, AFB and fungal  Please follow the instructions on the paperwork provided   We recommend today:  No orders of the defined types were placed in this encounter.  No orders of the defined types were placed in this encounter.  Meds ordered this encounter  Medications  . levofloxacin (LEVAQUIN) 500 MG tablet    Sig: Take 1 tablet (500 mg total) by mouth daily.    Dispense:  7 tablet    Refill:  0  . predniSONE (DELTASONE) 10 MG tablet    Sig: 4 tabs for 2 days, then 3 tabs for 2 days, 2 tabs for 2 days, then 1 tab for 2 days, then stop    Dispense:  20 tablet    Refill:  0    Follow Up:    Return in about 20 days (around 06/12/2019), or if symptoms worsen or fail to improve, for Follow up with Wyn Quaker FNP-C.   Please do your part to reduce the spread of  COVID-19:      Reduce your risk of any infection  and COVID19 by using the similar precautions used for avoiding the common cold or flu:  Marland Kitchen Wash your hands often with soap and warm water for at least 20 seconds.  If soap and water are not readily available, use an alcohol-based hand sanitizer with at least 60% alcohol.  . If coughing or sneezing, cover your mouth and nose by coughing or sneezing into the elbow areas of your shirt or coat, into a tissue or into your sleeve (not your hands). Langley Gauss A MASK when in public  . Avoid shaking hands with others and consider head nods or verbal greetings only. . Avoid touching your eyes, nose, or mouth with unwashed hands.  . Avoid close contact with people who are sick. . Avoid places or events with large numbers of people in one location, like concerts or sporting events. . If you have some symptoms but not all symptoms, continue to monitor at home and seek medical attention if your symptoms worsen. . If you are having a medical emergency, call 911.   ADDITIONAL HEALTHCARE OPTIONS FOR PATIENTS   Telehealth / e-Visit: [email or web address]  [facility]: [facility]: [phone]                   [facility]: [phone]     It is flu season:   >>> Best ways to protect herself from the flu: Receive the yearly flu vaccine, practice good hand hygiene washing with soap and also using hand sanitizer when available, eat a nutritious meals, get adequate rest, hydrate appropriately   Please contact the office if your symptoms worsen or you have concerns that you are not improving.   Thank you for choosing [facility] for your healthcare, and for allowing [place] to partner with you on your healthcare journey. I am thankful to be able to provide care to you today.   Wyn Quaker FNP-C

## 2019-05-24 ENCOUNTER — Other Ambulatory Visit: Payer: Medicare Other

## 2019-05-24 DIAGNOSIS — R059 Cough, unspecified: Secondary | ICD-10-CM

## 2019-05-24 DIAGNOSIS — R05 Cough: Secondary | ICD-10-CM

## 2019-05-29 ENCOUNTER — Other Ambulatory Visit: Payer: Self-pay | Admitting: Otolaryngology

## 2019-05-29 ENCOUNTER — Other Ambulatory Visit: Payer: Self-pay | Admitting: Internal Medicine

## 2019-05-29 DIAGNOSIS — R490 Dysphonia: Secondary | ICD-10-CM

## 2019-06-05 ENCOUNTER — Ambulatory Visit
Admission: RE | Admit: 2019-06-05 | Discharge: 2019-06-05 | Disposition: A | Discharge status: Home / Self Care | Payer: Medicare Other | Source: Ambulatory Visit | Attending: Otolaryngology | Admitting: Otolaryngology

## 2019-06-05 ENCOUNTER — Other Ambulatory Visit: Payer: Medicare Other

## 2019-06-05 DIAGNOSIS — R490 Dysphonia: Secondary | ICD-10-CM

## 2019-06-05 MED ORDER — IOPAMIDOL (ISOVUE-300) INJECTION 61%
75.0000 mL | Freq: Once | INTRAVENOUS | Status: AC | PRN
  Administered 2019-06-05: 75 mL via INTRAVENOUS

## 2019-06-12 ENCOUNTER — Ambulatory Visit (INDEPENDENT_AMBULATORY_CARE_PROVIDER_SITE_OTHER): Payer: Medicare Other | Admitting: Pulmonary Disease

## 2019-06-12 ENCOUNTER — Other Ambulatory Visit: Payer: Self-pay

## 2019-06-12 ENCOUNTER — Telehealth: Payer: Self-pay | Admitting: Pulmonary Disease

## 2019-06-12 ENCOUNTER — Encounter: Payer: Self-pay | Admitting: Pulmonary Disease

## 2019-06-12 VITALS — BP 122/70 | HR 84 | Temp 98.4°F | Ht 71.0 in | Wt 207.0 lb

## 2019-06-12 DIAGNOSIS — R490 Dysphonia: Secondary | ICD-10-CM | POA: Insufficient documentation

## 2019-06-12 DIAGNOSIS — J449 Chronic obstructive pulmonary disease, unspecified: Secondary | ICD-10-CM | POA: Diagnosis not present

## 2019-06-12 DIAGNOSIS — R918 Other nonspecific abnormal finding of lung field: Secondary | ICD-10-CM

## 2019-06-12 MED ORDER — TRELEGY ELLIPTA 100-62.5-25 MCG/INH IN AEPB: 1.0000 | INHALATION_SPRAY | Freq: Every day | RESPIRATORY_TRACT | 0 refills | Status: DC

## 2019-06-12 NOTE — Assessment & Plan Note (Addendum)
Plan: Continue follow-up with ENT Continue to follow-up with [facility] specialist - Dr. Nicolette Bang from [id number]

## 2019-06-12 NOTE — Assessment & Plan Note (Signed)
Plan: Trial of Trelegy Ellipta Hold Breo Ellipta and Spiriva Respimat 1.25 Increase overall physical activity

## 2019-06-12 NOTE — Telephone Encounter (Signed)
Called and spoke with pt's wife [name] who stated pt was referred to Dr. Nicolette Bang from [place] was calling to make Aaron Edelman aware of this as pt had an appt with him today 12/30. Routing to Elsie as an Pharmacist, hospital.

## 2019-06-12 NOTE — Progress Notes (Addendum)
@Patient  ID: Julian Brown, male    DOB: 11-17-1932, 85 y.o.   MRN: [id number]  Chief Complaint  Patient presents with  . Follow-up    F/U for COPD.     Referring provider: Prince Solian, MD  HPI:  85 year old male former smoker followed in our office for COPD  Past medical history: Hypertension, hyperlipidemia Smoking history: Former smoker.  Quit 1988.  36-pack-year smoking history. Maintenance: Breo Ellipta 100, Spiriva Respimat 1.25 Patient of Dr. Chase Caller  06/13/2019  - Visit   85 year old male former smoker followed in our office for COPD and emphysema.  Patient also has chronic cough as well as worsening hoarseness.  The hoarseness have been present for about 2 years.  He was encouraged at last office visit to present to ENT Dr. Benjamine Mola.  Upon evaluation by ENT he was shown to have vocal cord paralysis on the left-hand side.  This prompted a CT of his neck to be ordered.  Those results are listed below:  06/05/2019-CT soft tissue neck with contrast-asymmetric soft tissue posteriorly on the left at the level of the true vocal cords with an anterior displacement of the left arytenoid cartilage, this is concerning for neoplasm, recommend direct visualization  Patient reports that he was seen by Dr. Joya Gaskins at [facility].  Patient was then referred to a another specialist (Dr. Nicolette Bang from [facility]) who is scheduled to see you in 06/25/2019.  They are unsure of his name.  They will report this back to [place].  He continues to be maintained on Breo Ellipta 100 as well as Spiriva Respimat 1.25.  He continues to be short of breath and having occasional fatigue.  He admits he has not been as active lately due to the holidays as well as the colder weather.   Tests:   04/23/2019-RAST panel-no elevations, IgE 15 04/23/2019-CBC with differential-eosinophils relative 0.6, eosinophils absolute 0.1 04/23/2019-alpha-1-148, phenotype PI*MM  04/16/2019-CT chest without contrast-stable  tiny left upper lobe pulmonary nodules interval 1 year stability consistent with benign etiology, centrilobular emphysema, stable chronic atelectasis or scarring right middle lobe, stable bilateral thyroid nodules, aortic arthrosclerosis  11/20/2017-pulmonary function test-FVC 2.23 (59% predicted), postbronchodilator ratio 61, postbronchodilator FEV1 1.43 (54% predicted), no bronchodilator response, DLCO 12.99 (41% predicted)  FENO:  No results found for: NITRICOXIDE  PFT: PFT Results Latest Ref Rng & Units 11/20/2017  FVC-Pre L 2.23  FVC-Predicted Pre % 59  FVC-Post L 2.36  FVC-Predicted Post % 62  Pre FEV1/FVC % % 61  Post FEV1/FCV % % 61  FEV1-Pre L 1.35  FEV1-Predicted Pre % 51  FEV1-Post L 1.43  DLCO UNC% % 41  DLCO COR %Predicted % 70  TLC L 5.79  TLC % Predicted % 84  RV % Predicted % 125    WALK:  SIX MIN WALK 04/23/2019 05/16/2018 11/20/2017 10/25/2017 10/25/2017  Supplimental Oxygen during Test? (L/min) No No No Yes No  O2 Flow Rate - - - 2 -  Type - - - Continuous -  Tech Comments: Pt placed on 2L O2 and sats came up to 93 on 2L mid lap 1 he O2 sats dropped to 87-88 and heart rate dropped to 55 O2 applied at 2L to finish last 2 laps Pt walked all 3 required laps and pt's O2 did drop below 90% which does qualify him for daytime O2. Pt denied any complaints. Pt required 2L O2 with exertion and maintained stable sats. Pt was stopped and placed on O2    Imaging:  CT SOFT TISSUE NECK W CONTRAST  Result Date: 06/05/2019 CLINICAL DATA:  Vocal cord paralysis. Courses. EXAM: CT NECK WITH CONTRAST TECHNIQUE: Multidetector CT imaging of the neck was performed using the standard protocol following the bolus administration of intravenous contrast. CONTRAST:  86mL ISOVUE-300 IOPAMIDOL (ISOVUE-300) INJECTION 61% COMPARISON:  None. FINDINGS: Pharynx and larynx: Nasopharynx is unremarkable. Soft palate is within normal limits. The tongue base is normal. Epiglottis is within normal limits.  Asymmetric soft tissue is present posteriorly on the left at the level of the true vocal cords with anterior displacement left arytenoid cartilage. A soft tissue mass measures 16 x 18 x 7 mm. Detail is somewhat obscured by patient motion. Right vocal cord is within normal limits. Thyroid cartilage is intact. Trachea is otherwise normal. Salivary glands: The submandibular and parotid glands and ducts are within normal limits. Thyroid: Thyroid is mildly heterogeneous without a dominant lesion or significant enlargement. Lymph nodes: No significant cervical adenopathy is present. Vascular: Atherosclerotic calcifications are present carotid bifurcations bilaterally without significant stenosis. Limited intracranial: Visualized intracranial contents are within normal limits. Mastoids and visualized paranasal sinuses: The paranasal sinuses and mastoid air cells are clear. Skeleton: Multilevel degenerative changes are present cervical spine. No focal lytic or blastic lesions are present. Upper chest: Lung apices are clear. Thoracic inlet is normal. IMPRESSION: 1. Asymmetric soft tissue posteriorly on the left at the level of the true vocal cords with anterior displacement of the left arytenoid cartilage. This is concerning for a neoplasm. Recommend direct visualization. 2. No significant cervical adenopathy. 3. Atherosclerosis. 4. Multilevel degenerative changes of the cervical spine. Electronically Signed   By: San Morelle M.D.   On: 06/05/2019 13:52    Lab Results:  CBC    Component Value Date/Time   WBC 8.3 04/23/2019 1234   RBC 4.65 04/23/2019 1234   HGB 15.6 04/23/2019 1234   HCT 45.7 04/23/2019 1234   PLT 303.0 04/23/2019 1234   MCV 98.3 04/23/2019 1234   MCH 33.5 06/15/2017 0419   MCHC 34.0 04/23/2019 1234   RDW 13.7 04/23/2019 1234   LYMPHSABS 2.0 04/23/2019 1234   MONOABS 1.1 (H) 04/23/2019 1234   EOSABS 0.1 04/23/2019 1234   BASOSABS 0.1 04/23/2019 1234    BMET    Component Value  Date/Time   NA 133 (L) 06/15/2017 0419   K 3.8 06/15/2017 0419   CL 99 (L) 06/15/2017 0419   CO2 28 06/15/2017 0419   GLUCOSE 95 06/15/2017 0419   BUN 20 06/15/2017 0419   CREATININE 1.01 06/15/2017 0419   CALCIUM 9.0 06/15/2017 0419   GFRNONAA >60 06/15/2017 0419   GFRAA >60 06/15/2017 0419    BNP No results found for: BNP  ProBNP No results found for: PROBNP  Specialty Problems      Pulmonary Problems   Allergic rhinitis   Nocturnal hypoxemia    05/01/2018- overnight oximetry on room air- pulse oximetry less than 88% for 5-1/2 hours >>>Start 2 L nasal cannula at night      Stage 2 moderate COPD by GOLD classification ([place])    11/20/2017-pulmonary function test FVC 2.23 (59% predicted), postbronchodilator ratio 61, FEV1 54, no significant bronchodilator response, DLCO 41 which corrects to 70 with lung volumes Severe obstructive airways disease, probable restriction, severe diffusion defect      Cough      Allergies  Allergen Reactions  . Penicillins Swelling    Has patient had a PCN reaction causing immediate rash, facial/tongue/throat swelling, SOB or lightheadedness with hypotension:  no (lip swelling "as soon as the pill touched my mouth so I didn't swallow it") Has patient had a PCN reaction causing severe rash involving mucus membranes or skin necrosis: no Has patient had a PCN reaction that required hospitalization: no Has patient had a PCN reaction occurring within the last 10 years: no If all of the above answers are "NO", then may proceed with Ceph    Immunization History  Administered Date(s) Administered  . Fluad Quad(high Dose 65+) 05/14/2019  . Influenza, High Dose Seasonal PF 02/11/2017, 04/18/2018  . Pneumococcal Conjugate-13 11/06/2014    Past Medical History:  Diagnosis Date  . Acute gangrenous cholecystitis s/p lap cholecystectomy 06/08/2017 06/08/2017  . Arthritis   . ED (erectile dysfunction)   . Hyperlipemia   . Hypertension   .  Prostate cancer [facility])    prostate cancer-tx radiation  . Wears glasses     Tobacco History: Social History   Tobacco Use  Smoking Status Former Smoker  . Packs/day: 1.00  . Years: 36.00  . Pack years: 36.00  . Types: Cigarettes  . Quit date: 12/09/1986  . Years since quitting: 32.5  Smokeless Tobacco Never Used   Counseling given: Yes  Continue to not smoke  Outpatient Encounter Medications as of 06/12/2019  Medication Sig  . amLODipine (NORVASC) 10 MG tablet Take 10 mg by mouth daily.  Marland Kitchen aspirin EC 81 MG tablet Take 81 mg by mouth daily.  Marland Kitchen BREO ELLIPTA 100-25 MCG/INH AEPB INHALE 1 PUFF INTO THE LUNGS ONCE DAILY.  . cholecalciferol (VITAMIN D) 1000 UNITS tablet Take 2,000 Units by mouth daily.   . Multiple Vitamins-Minerals (PRESERVISION AREDS 2) CAPS Take by mouth.  . olmesartan-hydrochlorothiazide (BENICAR HCT) 40-12.5 MG tablet Take 1 tablet by mouth daily. Please order from [facility]  . rosuvastatin (CRESTOR) 10 MG tablet Take 10 mg by mouth daily.  Marland Kitchen SPIRIVA RESPIMAT 1.25 MCG/ACT AERS USE 2 PUFFS EACH DAY  . Fluticasone-Umeclidin-Vilant (TRELEGY ELLIPTA) 100-62.5-25 MCG/INH AEPB Inhale 1 puff into the lungs daily.  . [DISCONTINUED] doxycycline (VIBRA-TABS) 100 MG tablet Take 1 tablet (100 mg total) by mouth 2 (two) times daily.  . [DISCONTINUED] levofloxacin (LEVAQUIN) 500 MG tablet Take 1 tablet (500 mg total) by mouth daily.  . [DISCONTINUED] predniSONE (DELTASONE) 10 MG tablet 4 tabs for 2 days, then 3 tabs for 2 days, 2 tabs for 2 days, then 1 tab for 2 days, then stop   No facility-administered encounter medications on file as of 06/12/2019.     Review of Systems  Review of Systems  Constitutional: Positive for fatigue. Negative for activity change, chills, fever and unexpected weight change.  HENT: Positive for voice change. Negative for postnasal drip, rhinorrhea, sinus pressure, sinus pain and sore throat.   Eyes: Negative.   Respiratory: Positive for  shortness of breath. Negative for cough and wheezing.   Cardiovascular: Negative for chest pain and palpitations.  Gastrointestinal: Negative for constipation, diarrhea, nausea and vomiting.  Endocrine: Negative.   Genitourinary: Negative.   Musculoskeletal: Negative.   Skin: Negative.   Neurological: Negative for dizziness and headaches.  Psychiatric/Behavioral: Negative.  Negative for dysphoric mood. The patient is not nervous/anxious.   All other systems reviewed and are negative.    Physical Exam  BP 122/70 (BP Location: Left Arm, Patient Position: Sitting, Cuff Size: Normal)   Pulse 84   Temp 98.4 F (36.9 C) (Temporal)   Ht 5\' 11"  (1.803 m)   Wt 207 lb (93.9 kg)   SpO2 94%  BMI 28.87 kg/m   Wt Readings from Last 5 Encounters:  06/12/19 207 lb (93.9 kg)  04/23/19 220 lb 12.8 oz (100.2 kg)  06/27/18 225 lb (102.1 kg)  05/16/18 219 lb 12.8 oz (99.7 kg)  04/18/18 222 lb (100.7 kg)    BMI Readings from Last 5 Encounters:  06/12/19 28.87 kg/m  04/23/19 30.80 kg/m  06/27/18 31.38 kg/m  05/16/18 32.22 kg/m  04/18/18 30.96 kg/m     Physical Exam Vitals and nursing note reviewed.  Constitutional:      General: He is not in acute distress.    Appearance: Normal appearance. He is obese.  HENT:     Head: Normocephalic and atraumatic.     Right Ear: Hearing, tympanic membrane, ear canal and external ear normal.     Left Ear: Hearing, tympanic membrane, ear canal and external ear normal.     Nose: Nose normal. No mucosal edema or rhinorrhea.     Right Turbinates: Not enlarged.     Left Turbinates: Not enlarged.     Mouth/Throat:     Mouth: Mucous membranes are dry.     Pharynx: Oropharynx is clear. No oropharyngeal exudate.  Eyes:     Pupils: Pupils are equal, round, and reactive to light.  Neck:     Thyroid: No thyroid mass, thyromegaly or thyroid tenderness.  Cardiovascular:     Rate and Rhythm: Normal rate and regular rhythm.     Pulses: Normal pulses.      Heart sounds: Normal heart sounds. No murmur.  Pulmonary:     Effort: Pulmonary effort is normal.     Breath sounds: No decreased breath sounds, wheezing or rales.  Musculoskeletal:     Cervical back: Normal range of motion.     Right lower leg: No edema.     Left lower leg: No edema.  Lymphadenopathy:     Cervical: No cervical adenopathy.  Skin:    General: Skin is warm and dry.     Capillary Refill: Capillary refill takes less than 2 seconds.     Findings: No erythema or rash.  Neurological:     General: No focal deficit present.     Mental Status: He is alert and oriented to person, place, and time.     Motor: No weakness.     Coordination: Coordination normal.     Gait: Gait is intact. Gait normal.  Psychiatric:        Mood and Affect: Mood normal.        Behavior: Behavior normal. Behavior is cooperative.        Thought Content: Thought content normal.        Judgment: Judgment normal.       Assessment & Plan:   Stage 2 moderate COPD by GOLD classification (HCC) Plan: Trial of Trelegy Ellipta Hold Breo Ellipta and Spiriva Respimat 1.25 Increase overall physical activity    Abnormal finding on lung imaging Plan: Continue follow-up with [facility] specialists  Hoarseness of voice Plan: Continue follow-up with ENT Continue to follow-up with [facility] specialist - Dr. Nicolette Bang from [facility]    Return in about 3 months (around 09/10/2019), or if symptoms worsen or fail to improve, for Follow up with Dr. Purnell Shoemaker.   Lauraine Rinne, NP 06/13/2019   This appointment was 28 minutes long with over 50% of the time in direct face-to-face patient care, assessment, plan of care, and follow-up.

## 2019-06-12 NOTE — Patient Instructions (Addendum)
You were seen today by Lauraine Rinne, NP  for:   1. Stage 2 moderate COPD by GOLD classification ([place])  Trial of Trelegy Ellipta  >>> 1 puff daily in the morning >>>rinse mouth out after use  >>> This inhaler contains 3 medications that help manage her respiratory status, contact our office if you cannot afford this medication or unable to remain on this medication  Hold Breo Ellipta and Spiriva when using Trelegy Ellipta  Contact our office if you feel that the Trelegy Ellipta has worked well for you so we can send in a prescription  Note your daily symptoms > remember "red flags" for COPD:   >>>Increase in cough >>>increase in sputum production >>>increase in shortness of breath or activity  intolerance.   If you notice these symptoms, please call the office to be seen.    2. Hoarseness of voice  Continue to follow-up with ENT Continue to follow-up with [facility] specialist  3. Abnormal finding on lung/neck imaging  Continue to follow-up with weight for specialist   We recommend today:  No orders of the defined types were placed in this encounter.  No orders of the defined types were placed in this encounter.  Meds ordered this encounter  Medications  . Fluticasone-Umeclidin-Vilant (TRELEGY ELLIPTA) 100-62.5-25 MCG/INH AEPB    Sig: Inhale 1 puff into the lungs daily.    Dispense:  2 each    Refill:  0    Order Specific Question:   Lot Number?    Answer:   [id number]    Order Specific Question:   Expiration Date?    Answer:   09/10/2020    Order Specific Question:   Manufacturer?    Answer:   [employer] [[email or web address]]    Follow Up:     Return in about 3 months (around 09/10/2019), or if symptoms worsen or fail to improve, for Follow up with Dr. Purnell Shoemaker.   Please do your part to reduce the spread of COVID-19:      Reduce your risk of any infection  and COVID19 by using the similar precautions used for avoiding the common cold or flu:  Marland Kitchen Wash your hands often with  soap and warm water for at least 20 seconds.  If soap and water are not readily available, use an alcohol-based hand sanitizer with at least 60% alcohol.  . If coughing or sneezing, cover your mouth and nose by coughing or sneezing into the elbow areas of your shirt or coat, into a tissue or into your sleeve (not your hands). Langley Gauss A MASK when in public  . Avoid shaking hands with others and consider head nods or verbal greetings only. . Avoid touching your eyes, nose, or mouth with unwashed hands.  . Avoid close contact with people who are sick. . Avoid places or events with large numbers of people in one location, like concerts or sporting events. . If you have some symptoms but not all symptoms, continue to monitor at home and seek medical attention if your symptoms worsen. . If you are having a medical emergency, call 911.   [place] / e-Visit: [email or web address]         [facility]: [phone]  [facility]: [id number]                   [facility]: [id number]     It is flu season:   >>> Best  ways to protect herself from the flu: Receive the yearly flu vaccine, practice good hand hygiene washing with soap and also using hand sanitizer when available, eat a nutritious meals, get adequate rest, hydrate appropriately   Please contact the office if your symptoms worsen or you have concerns that you are not improving.   Thank you for choosing [facility] for your healthcare, and for allowing [place] to partner with you on your healthcare journey. I am thankful to be able to provide care to you today.   Wyn Quaker FNP-C    COPD and Physical Activity Chronic obstructive pulmonary disease (COPD) is a long-term (chronic) condition that affects the lungs. COPD is a general term that can be used to describe many different lung problems  that cause lung swelling (inflammation) and limit airflow, including chronic bronchitis and emphysema. The main symptom of COPD is shortness of breath, which makes it harder to do even simple tasks. This can also make it harder to exercise and be active. Talk with your health care provider about treatments to help you breathe better and actions you can take to prevent breathing problems during physical activity. What are the benefits of exercising with COPD? Exercising regularly is an important part of a healthy lifestyle. You can still exercise and do physical activities even though you have COPD. Exercise and physical activity improve your shortness of breath by increasing blood flow (circulation). This causes your heart to pump more oxygen through your body. Moderate exercise can improve your:  Oxygen use.  Energy level.  Shortness of breath.  Strength in your breathing muscles.  Heart health.  Sleep.  Self-esteem and feelings of self-worth.  Depression, stress, and anxiety levels. Exercise can benefit everyone with COPD. The severity of your disease may affect how hard you can exercise, especially at first, but everyone can benefit. Talk with your health care provider about how much exercise is safe for you, and which activities and exercises are safe for you. What actions can I take to prevent breathing problems during physical activity?  Sign up for a pulmonary rehabilitation program. This type of program may include: ? Education about lung diseases. ? Exercise classes that teach you how to exercise and be more active while improving your breathing. This usually involves:  Exercise using your lower extremities, such as a stationary bicycle.  About 30 minutes of exercise, 2 to 5 times per week, for 6 to 12 weeks  Strength training, such as push ups or leg lifts. ? Nutrition education. ? Group classes in which you can talk with others who also have COPD and learn ways to manage  stress.  If you use an oxygen tank, you should use it while you exercise. Work with your health care provider to adjust your oxygen for your physical activity. Your resting flow rate is different from your flow rate during physical activity.  While you are exercising: ? Take slow breaths. ? Pace yourself and do not try to go too fast. ? Purse your lips while breathing out. Pursing your lips is similar to a kissing or whistling position. ? If doing exercise that uses a quick burst of effort, such as weight lifting:  Breathe in before starting the exercise.  Breathe out during the hardest part of the exercise (such as raising the weights). Where to find support You can find support for exercising with COPD from:  Your health care provider.  A pulmonary rehabilitation program.  Your local health department or community  health programs.  Support groups, online or in-person. Your health care provider may be able to recommend support groups. Where to find more information You can find more information about exercising with COPD from:  American Lung Association: ClassInsider.se.  COPD Foundation: https://www.rivera.net/. Contact a health care provider if:  Your symptoms get worse.  You have chest pain.  You have nausea.  You have a fever.  You have trouble talking or catching your breath.  You want to start a new exercise program or a new activity. Summary  COPD is a general term that can be used to describe many different lung problems that cause lung swelling (inflammation) and limit airflow. This includes chronic bronchitis and emphysema.  Exercise and physical activity improve your shortness of breath by increasing blood flow (circulation). This causes your heart to provide more oxygen to your body.  Contact your health care provider before starting any exercise program or new activity. Ask your health care provider what exercises and activities are safe for you. This information is  not intended to replace advice given to you by your health care provider. Make sure you discuss any questions you have with your health care provider. Document Released: 06/22/2017 Document Revised: 09/19/2018 Document Reviewed: 06/22/2017 Elsevier Patient Education  2020 [employer].

## 2019-06-12 NOTE — Assessment & Plan Note (Signed)
Plan: Continue follow-up with [facility] specialists

## 2019-06-13 NOTE — Telephone Encounter (Signed)
06/13/2019 1023  Thank you for letting me know.  I will added to my note from yesterday.  Nothing further needed.  Wyn Quaker, FNP

## 2019-06-24 LAB — RESPIRATORY CULTURE OR RESPIRATORY AND SPUTUM CULTURE
MICRO NUMBER:: 1189559
RESULT:: NORMAL
SPECIMEN QUALITY:: ADEQUATE

## 2019-06-24 LAB — FUNGUS CULTURE W SMEAR
MICRO NUMBER:: 1189558
SMEAR:: NONE SEEN
SPECIMEN QUALITY:: ADEQUATE

## 2019-07-26 ENCOUNTER — Telehealth: Payer: Self-pay | Admitting: *Deleted

## 2019-07-26 NOTE — Telephone Encounter (Signed)
Oncology Nurse Navigator Documentation  Placed introductory call to new referral patient Julian Brown.  Spoke with wife as he is having difficulty talking.  Introduced myself as the H&N oncology nurse navigator that works with Dr. Isidore Moos to whom he has been referred by Dr. Nicolette Bang, [facility].  She confirmed understanding of referral.  Briefly explained my role as their navigator, provided my contact information.   Explained she will be contacted by Dr. Pearlie Oyster scheduler to arrange appointment.  She stated preference for telephone consult.  She confirmed understanding of [place] location per Julian Brown' treatment for prostate cancer with Dr. Tammi Klippel a number of years ago.   I explained arrival and registration process, noted the availability of gratis valet service.  I explained the purpose of a dental evaluation prior to starting RT, indicated he might be contacted by [facility] DM to arrange an appt.    I encouraged them to call me with questions/concerns prior to appt with Dr. Isidore Moos.      She verbalized understanding of information provided, expressed appreciation for my call.  Navigator Initial Assessment . Employment Status: retired . Currently on FMLA / STD: n/a . Living Situation: lives with wife . Support System: wife, 2 dtrs who also live in  . PCP: . PCD: . Financial Concerns: . Transportation Needs: no . Sensory Deficits: . Language Barriers/Interpreter Needed:  no . Ambulation Needs: no . DME Used in Home: no . Psychosocial Needs:  no . Concerns/Needs Understanding Cancer:  addressed/answered by navigator to best of ability . Self-Expressed Needs: no  Gayleen Orem, RN, BSN Head & Neck Oncology Nurse [place] at [place] [phone]

## 2019-07-30 NOTE — Progress Notes (Signed)
Error, duplicate

## 2019-08-01 ENCOUNTER — Encounter: Payer: Self-pay | Admitting: *Deleted

## 2019-08-01 NOTE — Progress Notes (Signed)
Head and Neck Cancer Location of Tumor / Histology:  07/12/19 FINAL PATHOLOGIC DIAGNOSIS MICROSCOPIC EXAMINATION AND DIAGNOSIS LEFT ARYTENOID, BIOPSY: Invasive squamous cell carcinoma. Fragments of benign bone and cartilage.  Patient presented with symptoms of: Progressive hoarseness in January 2021 for 2 years.   Biopsies of Left arytenoid revealed: invasive squamous cell carcinoma.   Nutrition Status Yes No Comments  Weight changes? [x]  []  He lost about 10 after surgery, but reports his weight has improved since the initial weight loss.   Swallowing concerns? [x]  []  He is able to eat most foods except steak, chicken since surgery. Before surgery he was only eating softer foods.   PEG? []  [x]     Referrals Yes No Comments  Social Work? []  [x]    Dentistry? []  [x]    Swallowing therapy? []  [x]    Nutrition? []  [x]    Med/Onc? []  [x]     Safety Issues Yes No Comments  Prior radiation? [x]  []  Yes, prostate radiation. His wife believes around 2004-2006. Dr. Tammi Klippel.   Pacemaker/ICD? []  [x]    Possible current pregnancy? []  [x]    Is the patient on methotrexate? []  [x]     Tobacco/Marijuana/Snuff/ETOH use: He is a former smoker, quitting in 1988. He does drink alcohol.   Past/Anticipated interventions by otolaryngology, if any:  07/12/19 Dr. Nicolette Bang Procedures/Surgeries performed during hospitalization:  SUSPENSION MICRO DIRECT LARYNGOSCOPY W/ CO2 LASER (N/A Airway   Past/Anticipated interventions by medical oncology, if any:  Not scheduled.     Current Complaints / other details:

## 2019-08-02 ENCOUNTER — Ambulatory Visit: Payer: Medicare Other

## 2019-08-02 ENCOUNTER — Ambulatory Visit
Admission: RE | Admit: 2019-08-02 | Discharge: 2019-08-02 | Disposition: A | Discharge status: Home / Self Care | Payer: Medicare Other | Source: Ambulatory Visit | Attending: Radiation Oncology | Admitting: Radiation Oncology

## 2019-08-05 NOTE — Progress Notes (Signed)
Radiation Oncology         [phone] ________________________________  Initial outpatient Consultation by telephone as patient was unable to access MyChart video during pandemic precautions  Name: Julian Brown MRN: [id number]  Date: 08/06/2019  DOB: January 02, 1933  [id number], Ravisankar, MD  Francina Ames, MD   REFERRING PHYSICIAN: Francina Ames, MD  DIAGNOSIS:    ICD-10-CM   1. Screening for hypothyroidism  Z13.29 TSH  2. Malignant neoplasm of supraglottis (HCC)  C32.1 CBC with Differential/Platelet    Comprehensive metabolic panel    TSH    Ambulatory referral to Social Work    Ambulatory referral to Physical Therapy    Amb Referral to Nutrition and Diabetic E    Referral to Neuro Rehab  3. Other malaise  R53.81 TSH   Cancer Staging Malignant neoplasm of supraglottis ([place]) Staging form: Larynx - Supraglottis, AJCC 8th Edition - Clinical stage from 08/06/2019: Stage III (cT3, cN0, cM0) - Signed by Eppie Gibson, MD on 08/08/2019   CHIEF COMPLAINT: Here to discuss management of throat cancer   HISTORY OF PRESENT ILLNESS::Julian Brown is a 86 y.o. male who presented with hoarseness. The hoarseness was first noted over 3 years ago. Per his wife, the patient was evaluated by ENT in 2019 but no findings were noted. He was referred to Dr. Benjamine Mola on 05/29/2019, and laryngoscopy was performed. Laryngoscopy revealed: left vocal cord paralysis of unknown etiology, causing patient's chronic hoarseness; significant posterior laryngeal edema with possible soft tissue mass. He proceeded to neck CT on 06/05/2019, showing: asymmetric soft tissue posteriorly on left at level of true vocal cords with anterior displacement of left arytenoid cartilage; no significant cervical adenopathy.  Subsequently, the patient saw Dr. Nicolette Bang who performed suspension micro direct laryngoscopy on 07/12/2019. Per notes, it appears that patient has paralysis of the vocal fold and cord on the left, and a  cartilagenous mass was removed by CO2 laser in the OR. Biopsy of left arytenoid on 07/12/2019 revealed: invasive squamous cell carcinoma. Of note, the patient experienced postoperative complications, including a firm submandibular abscess. The drainage obtained from the abscess was benign. He was treated with clindamycin and discharged.  Swallowing issues, if any: he is able to swallow most foods except steak, lettuce since surgery. Before surgery he was only eating softer foods.  Ate chick fil A sandwich this week.   Weight Changes: 10-15lb weight  Wt Readings from Last 3 Encounters:  06/12/19 207 lb (93.9 kg)  04/23/19 220 lb 12.8 oz (100.2 kg)  06/27/18 225 lb (102.1 kg)     Tobacco history, if any: former smoker, quit in 1988  ETOH abuse, if any: none, occasional use  Prior cancers, if any: yes, prostate cancer  PREVIOUS RADIATION THERAPY: Yes  02/2009: Prostate, IMRT (Dr. Tammi Klippel)  PAST MEDICAL HISTORY:  has a past medical history of Acute gangrenous cholecystitis s/p lap cholecystectomy 06/08/2017 (06/08/2017), Arthritis, Colon cancer ([place]), ED (erectile dysfunction), Emphysema of lung ([place]), Hyperlipemia, Hypertension, Prostate cancer ([place]), and Wears glasses.    PAST SURGICAL HISTORY: Past Surgical History:  Procedure Laterality Date  . APPENDECTOMY    . CARPAL TUNNEL RELEASE  5/12   lt  . CARPAL TUNNEL RELEASE  12/13/2011   Procedure: CARPAL TUNNEL RELEASE;  Surgeon: [name]., MD;  Location: [facility];  Service: Orthopedics;  Laterality: Right;  and inject right wrist  . CHOLECYSTECTOMY N/A 06/08/2017   Procedure: LAPAROSCOPIC CHOLECYSTECTOMY WITH LYSIS OF ADHESIONS;  Surgeon: [name], MD;  Location: [facility];  Service: General;  Laterality: N/A;  . COLON SURGERY  1997   hemicolectomy-rt-ca  . COLONOSCOPY     about 12 inches of colon removed due to colon cancer    FAMILY HISTORY: family history is not on file.  SOCIAL HISTORY:  reports that  he quit smoking about 32 years ago. His smoking use included cigarettes. He has a 36.00 pack-year smoking history. He has never used smokeless tobacco. He reports current alcohol use. He reports that he does not use drugs.  ALLERGIES: Penicillins  MEDICATIONS:  Current Outpatient Medications  Medication Sig Dispense Refill  . amLODipine (NORVASC) 10 MG tablet Take 10 mg by mouth daily.    Marland Kitchen aspirin EC 81 MG tablet Take 81 mg by mouth daily.    Marland Kitchen BREO ELLIPTA 100-25 MCG/INH AEPB INHALE 1 PUFF INTO THE LUNGS ONCE DAILY. 60 each 0  . cholecalciferol (VITAMIN D) 1000 UNITS tablet Take 2,000 Units by mouth daily.     . fluticasone (FLONASE) 50 MCG/ACT nasal spray     . Multiple Vitamins-Minerals (PRESERVISION AREDS 2) CAPS Take by mouth.    . olmesartan-hydrochlorothiazide (BENICAR HCT) 40-12.5 MG tablet Take 1 tablet by mouth daily. Please order from [facility]    . rosuvastatin (CRESTOR) 10 MG tablet Take 10 mg by mouth daily.    Marland Kitchen SPIRIVA RESPIMAT 1.25 MCG/ACT AERS USE 2 PUFFS EACH DAY 4 g 5  . sodium fluoride (PREVIDENT 5000 PLUS) 1.1 % CREA dental cream Apply to tooth brush. Brush teeth for 2 minutes. Spit out excess-DO NOT swallow. Repeat nightly. 1 Tube prn   No current facility-administered medications for this encounter.    REVIEW OF SYSTEMS:  Notable for that above.   PHYSICAL EXAM:  vitals were not taken for this visit.   General: Alert and oriented, in no acute distress. Hoarse Psychiatric: Judgment and insight are intact. Affect is appropriate.    LABORATORY DATA:  Lab Results  Component Value Date   WBC 8.3 04/23/2019   HGB 15.6 04/23/2019   HCT 45.7 04/23/2019   MCV 98.3 04/23/2019   PLT 303.0 04/23/2019   CMP     Component Value Date/Time   NA 133 (L) 06/15/2017 0419   K 3.8 06/15/2017 0419   CL 99 (L) 06/15/2017 0419   CO2 28 06/15/2017 0419   GLUCOSE 95 06/15/2017 0419   BUN 20 06/15/2017 0419   CREATININE 1.01 06/15/2017 0419   CALCIUM 9.0 06/15/2017 0419     PROT 5.8 (L) 06/15/2017 0419   ALBUMIN 2.9 (L) 06/15/2017 0419   AST 34 06/15/2017 0419   ALT 39 06/15/2017 0419   ALKPHOS 43 06/15/2017 0419   BILITOT 0.8 06/15/2017 0419   GFRNONAA >60 06/15/2017 0419   GFRAA >60 06/15/2017 0419      No results found for: TSH   RADIOGRAPHY: No results found.    IMPRESSION/PLAN: This is a delightful patient with head and neck cancer, referred for non-operative treatment.  I do recommend radiotherapy for this patient.  While chemotherapy could be considered concurrently, I doubt the potential benefits outweigh the risks, given his comorbidities. He will be discussed at our ENT board.  We discussed the potential risks, benefits, and side effects of radiotherapy. We talked in detail about acute and late effects. We discussed that some of the most bothersome acute effects may be mucositis, dysgeusia, salivary changes, skin irritation, hair loss, dehydration, weight loss and fatigue. We talked about late effects which include but are not  necessarily limited to dysphagia, hypothyroidism, soft tissue/cartilage injury, xerostomia, and neck edema. No guarantees of treatment were given.  I look forward to participating in the patient's care.    Simulation (treatment planning) will take place soon, after dental release  We also discussed that the treatment of head and neck cancer is a multidisciplinary process to maximize treatment outcomes and quality of life. For this reason the following referrals have been or will be made:   Dentistry for dental evaluation, advice on reducing risk of cavities, osteoradionecrosis, or other oral issues. Port sheet completed and sent to Dr. Enrique Sack to estimate dose to tooth roots.   Nutritionist for nutrition support during and after treatment.   Speech language pathology for swallowing and/or speech therapy.   Social work for social support.    Physical therapy due to risk of lymphedema in neck and deconditioning.    Baseline labs including TSH.  This encounter was provided by telemedicine platform by telephone as patient was unable to access MyChart video during pandemic precautions The patient has given verbal consent for this type of encounter and has been advised to only accept a meeting of this type in a secure network environment. The time spent during this encounter in total on date of service was 60 minutes. The attendants for this meeting include Eppie Gibson  and GRIFFYN Brown.  During the encounter, Eppie Gibson was located at [facility] Radiation Oncology Department.  Julian Brown was located at home.   __________________________________________   Eppie Gibson, MD   This document serves as a record of services personally performed by Eppie Gibson, MD. It was created on her behalf by Wilburn Mylar, a trained medical scribe. The creation of this record is based on the scribe's personal observations and the provider's statements to them. This document has been checked and approved by the attending provider.

## 2019-08-06 ENCOUNTER — Encounter: Payer: Self-pay | Admitting: Radiation Oncology

## 2019-08-06 ENCOUNTER — Encounter: Payer: Self-pay | Admitting: *Deleted

## 2019-08-06 ENCOUNTER — Other Ambulatory Visit: Payer: Self-pay

## 2019-08-06 ENCOUNTER — Ambulatory Visit
Admission: RE | Admit: 2019-08-06 | Discharge: 2019-08-06 | Disposition: A | Discharge status: Home / Self Care | Payer: Medicare Other | Source: Ambulatory Visit | Attending: Radiation Oncology | Admitting: Radiation Oncology

## 2019-08-06 DIAGNOSIS — C320 Malignant neoplasm of glottis: Secondary | ICD-10-CM

## 2019-08-06 DIAGNOSIS — Z1329 Encounter for screening for other suspected endocrine disorder: Secondary | ICD-10-CM

## 2019-08-06 DIAGNOSIS — C321 Malignant neoplasm of supraglottis: Secondary | ICD-10-CM

## 2019-08-06 DIAGNOSIS — R5381 Other malaise: Secondary | ICD-10-CM

## 2019-08-06 HISTORY — DX: Emphysema, unspecified: J43.9

## 2019-08-06 NOTE — Progress Notes (Signed)
Oncology Nurse Navigator Documentation  Joined Julian Brown and his wife during Teleconsult with Dr. Isidore Moos to discuss XRT for his laryngeal SCC. They voiced understanding of:  Plan for 7 weeks M-F tmt.  Referrals to SLP, PT, Nuitrition, SW.  They agreed to appt with SLP next Thursday morning during H&N MDC.  Referral to [facility]istry for pre-radiotherapy evaluation to include impressions for SPD.  Scheduling of CT SIM early next week pending final fitting for SPD. I again provided my phone number, encouraged them to call me with questions/concerns moving forward.  Gayleen Orem, RN, BSN Head & Neck Oncology Nurse [place] at [place] [phone]

## 2019-08-06 NOTE — Progress Notes (Signed)
Dental Form with Estimates of Radiation Dose      Diagnosis:    ICD-10-CM   1. Screening for hypothyroidism  Z13.29 TSH  2. Malignant neoplasm of supraglottis (HCC)  C32.1 CBC with Differential/Platelet    Comprehensive metabolic panel    TSH    Ambulatory referral to Social Work    Ambulatory referral to Physical Therapy    Amb Referral to Nutrition and Diabetic E    Referral to Neuro Rehab  3. Other malaise  R53.81 TSH    Prognosis: curative  Anticipated # of fractions: 35    Daily?: yes  # of weeks of radiotherapy: 7  Chemotherapy?: no  Anticipated xerostomia:  Mild permanent    Pre-simulation needs: Scatter protection  Simulation: ASAP - ideally by 08/13/19 in the AM.  Other Notes:   Please contact Eppie Gibson, MD, with patient's disposition after evaluation and/or dental treatment.

## 2019-08-07 ENCOUNTER — Encounter: Payer: Self-pay | Admitting: Rehabilitation

## 2019-08-07 ENCOUNTER — Other Ambulatory Visit: Payer: Self-pay

## 2019-08-07 ENCOUNTER — Telehealth: Payer: Self-pay | Admitting: Nutrition

## 2019-08-07 ENCOUNTER — Ambulatory Visit: Payer: Medicare Other | Attending: Radiation Oncology | Admitting: Rehabilitation

## 2019-08-07 ENCOUNTER — Encounter: Payer: Self-pay | Admitting: General Practice

## 2019-08-07 DIAGNOSIS — R293 Abnormal posture: Secondary | ICD-10-CM | POA: Diagnosis present

## 2019-08-07 DIAGNOSIS — C321 Malignant neoplasm of supraglottis: Secondary | ICD-10-CM | POA: Diagnosis present

## 2019-08-07 DIAGNOSIS — Z01818 Encounter for other preprocedural examination: Secondary | ICD-10-CM

## 2019-08-07 NOTE — Progress Notes (Signed)
[place] Initial Psychosocial Assessment Clinical Social Work  Clinical Social Work contacted by phone to assess psychosocial, emotional, mental health, and spiritual needs of the patient. Patient driving and reports being hoarse - asked CSW to speak w wife.    Barriers to care/review of distress screen:  - Transportation:  Do you anticipate any problems getting to appointments?  Do you have someone who can help run errands for you if you need it?  No problems w transport, both patient and wife drive.   - Help at home:  What is your living situation (alone, family, other)?  If you are physically unable to care for yourself, who would you call on to help you?  Lives w wife.   - Support system:  What does your support system look like?  Who would you call on if you needed some kind of practical help?  What if you needed someone to talk to for emotional support?  Have family members in [place]:  Are you concerned about finances.  Considering returning to work?  If not, applying for disability?  Have good insurance, Medicare and supplemental.  No concerns about finances.   What is your understanding of where you are with your cancer? Its cause?  Your treatment plan and what happens next?  86 year old male, newly diagnosed with   What are your worries for the future as you begin treatment for cancer?  Pt reports that he does not worry, wife says she worries more than him.    What are your hopes and priorities during your treatment? What is important to you? What are your goals for your care?  Loves music, loves grandchildren and want to have contact w them.  Being able to listen to music while in treatment might be helpful to him and reduce any anxiety.   CSW Summary:  Patient and family psychosocial functioning including strengths, limitations, and coping skills:  86 yo male, newly diagnosed w throat cancer after prolonged period of hoarseness, had two recent hospitalizations.  Lives w  wife who is very supportive, able to help w transportation and any care needs in the home.  No anxiety re upcoming radiation treatments, but does wonder if he will be able to drive during treatments.  No financial worries, except about whether insurance will pay bills for recent inpatient stays and to what extent.  Explained role of Patient and [facility], encouraged them to either call us directly or ask for help via Nurse Navigator.  They do not anticipate any issues, but appreciate the offer of help if needed. Provided information on Triage Cancer, a national organization that specializes in helping patients diagnosed w cancer with their insurance or financial concerns.  Encouraged wife to reach out to them for assistance if needed.    Identifications of barriers to care: none identified  Availability of community resources:  [place] as needed  Clinical Social Worker follow up needed: No.   Edwyna Shell, [place] Worker Phone:  [phone]

## 2019-08-07 NOTE — Telephone Encounter (Signed)
Scheduled 3/2 appt per 2/24 sch msg. Left pt a voicemail with appt date and time.

## 2019-08-07 NOTE — Therapy (Signed)
[place], [place], [address] Phone: [phone]   Fax:  [phone]  Physical Therapy Evaluation  Patient Details  Name: Julian Brown MRN: [id number] Date of Birth: 1933-04-13 Referring Provider (PT): Dr. Isidore Moos   Encounter Date: 08/07/2019  PT End of Session - 08/07/19 1350    Visit Number  1    Number of Visits  1    PT Start Time  1108    PT Stop Time  1140    PT Time Calculation (min)  32 min    Activity Tolerance  Patient tolerated treatment well    Behavior During Therapy  [facility] for tasks assessed/performed       Past Medical History:  Diagnosis Date  . Acute gangrenous cholecystitis s/p lap cholecystectomy 06/08/2017 06/08/2017  . Arthritis   . ED (erectile dysfunction)   . Emphysema of lung ([place])   . Hyperlipemia   . Hypertension   . Prostate cancer [facility])    prostate cancer-tx radiation  . Wears glasses     Past Surgical History:  Procedure Laterality Date  . APPENDECTOMY    . CARPAL TUNNEL RELEASE  5/12   lt  . CARPAL TUNNEL RELEASE  12/13/2011   Procedure: CARPAL TUNNEL RELEASE;  Surgeon: [name]., MD;  Location: [place];  Service: Orthopedics;  Laterality: Right;  and inject right wrist  . CHOLECYSTECTOMY N/A 06/08/2017   Procedure: LAPAROSCOPIC CHOLECYSTECTOMY WITH LYSIS OF ADHESIONS;  Surgeon: [name], MD;  Location[facility];  Service: General;  Laterality: N/A;  . [place]   hemicolectomy-rt-ca  . COLONOSCOPY     about 12 inches of colon removed due to colon cancer    There were no vitals filed for this visit.   Subjective Assessment - 08/07/19 1116    Subjective  I will be starting radiation soon    Patient is accompained by:  Family member   wife   Pertinent History  OA bil knees, HTN, high cholesterol, stage 2 COPD, Recently diagnosed with Larynx Cancer Pre radiation. SUSPENSION MICRO DIRECT LARYNGOSCOPY W/ CO2 LASER  on 07/12/19 at [facility]    Patient Stated Goals  learn what to do before radiation    Currently in Pain?  No/denies         [facility] PT Assessment - 08/07/19 0001      Assessment   Medical Diagnosis  larynx cancer    Referring Provider (PT)  Dr. Isidore Moos    Onset Date/Surgical Date  07/12/19    Hand Dominance  Right    Prior Therapy  no      Precautions   Precaution Comments  none      Restrictions   Weight Bearing Restrictions  No      Balance Screen   Has the patient fallen in the past 6 months  No    Has the patient had a decrease in activity level because of a fear of falling?   No    Is the patient reluctant to leave their home because of a fear of falling?   No      Home Environment   Living Environment  Private residence    Living Arrangements  Spouse/significant other    Available Help at Discharge  Family    Type of [place]    Additional Comments  no home mobility issues      Prior Function   Level of Independence  Independent    Vocation  Retired      Charity fundraiser Status  Within Functional Limits for tasks assessed      Coordination   Gross Motor Movements are Fluid and Coordinated  Yes      Posture/Postural Control   Posture/Postural Control  Postural limitations    Postural Limitations  Rounded Shoulders;Forward head;Increased thoracic kyphosis      ROM / Strength   AROM / PROM / Strength  AROM      AROM   Overall AROM Comments  shoulders WNL in seated and no pain    AROM Assessment Site  Shoulder;Cervical    Right/Left Shoulder  Right;Left    Cervical Flexion  35    Cervical Extension  47    Cervical - Right Side Bend  13    Cervical - Left Side Bend  22    Cervical - Right Rotation  56    Cervical - Left Rotation  56      Ambulation/Gait   Gait Comments  gait WNL; no AD        LYMPHEDEMA/ONCOLOGY QUESTIONNAIRE - 08/07/19 1123      Lymphedema Assessments   Lymphedema Assessments  Head and Neck      Head and Neck   8 cm  superior to sternal notch around neck  46 cm    Other  26   tragus to tragus over the largest part of the neck/chin            Objective measurements completed on examination: See above findings.              PT Education - 08/07/19 1350    Education Details  why exercise, walking program, postural and neck exercises, breathing    Person(s) Educated  Patient;Spouse    Methods  Explanation;Demonstration;Tactile cues;Verbal cues;Handout    Comprehension  Verbalized understanding;Returned demonstration;Verbal cues required;Tactile cues required              Head and Neck Clinic Goals - 08/07/19 1354      Patient will be able to verbalize understanding of a home exercise program for cervical range of motion, posture, and walking.    Status  Achieved      Patient will be able to verbalize understanding of proper sitting and standing posture.    Status  Achieved      Patient will be able to verbalize understanding of lymphedema risk and availability of treatment for this condition.    Status  Achieved         Plan - 08/07/19 1351    Clinical Impression Statement  Pt presents with wife pre radiation for cancer of the larynx.  Currently pt is mobile at home in the community limited slighty by COPD and requiring O2 at night.  Pt is currently not active and pretty sedentary during the day.  Cervical and shoulder AROM WNL but with poor seated posture and accessory muscle breathing.  Pt was educated on the inportance of exercise and movement during radiation to combat fatigue and decrease effects on the muscles and skin.    Personal Factors and Comorbidities  Comorbidity 1    Comorbidities  COPD    Stability/Clinical Decision Making  Stable/Uncomplicated    Clinical Decision Making  Low    Rehab Potential  Excellent    PT Frequency  One time visit    PT Treatment/Interventions  ADLs/Self Care Home Management    PT Next Visit Plan  reassess if  return after radiation    Consulted and Agree with Plan of Care  Patient;Family member/caregiver       Patient will benefit from skilled therapeutic intervention in order to improve the following deficits and impairments:  Postural dysfunction  Visit Diagnosis: Abnormal posture     Problem List Patient Active Problem List   Diagnosis Date Noted  . Hoarseness of voice 06/12/2019  . Cough 05/23/2019  . Primary osteoarthritis of right wrist 07/31/2018  . Stage 2 moderate COPD by GOLD classification ([place]) 05/16/2018  . Allergic rhinitis 05/16/2018  . Nocturnal hypoxemia 05/16/2018  . Abnormal finding on lung imaging 05/16/2018  . Hypertension   . Hyperlipemia   . Acute gangrenous cholecystitis s/p lap cholecystectomy 06/08/2017 06/08/2017  . Pain in right wrist 01/04/2017  . Chronic pain of left knee 01/04/2017  . Chronic pain of right knee 01/04/2017  . Unilateral primary osteoarthritis, left knee 01/04/2017  . Unilateral primary osteoarthritis, right knee 01/04/2017    Stark Bray 08/07/2019, 1:54 PM  [place], [place], [address] Phone: [phone]   Fax:  [phone]  Name: Julian Brown MRN: [id number] Date of Birth: Dec 29, 1932

## 2019-08-08 ENCOUNTER — Encounter (HOSPITAL_COMMUNITY): Payer: Self-pay | Admitting: Dentistry

## 2019-08-08 ENCOUNTER — Encounter: Payer: Self-pay | Admitting: Radiation Oncology

## 2019-08-08 ENCOUNTER — Ambulatory Visit (HOSPITAL_COMMUNITY): Payer: Self-pay | Admitting: Dentistry

## 2019-08-08 VITALS — BP 153/65 | HR 68 | Temp 98.2°F

## 2019-08-08 DIAGNOSIS — K0530 Chronic periodontitis, unspecified: Secondary | ICD-10-CM

## 2019-08-08 DIAGNOSIS — M264 Malocclusion, unspecified: Secondary | ICD-10-CM

## 2019-08-08 DIAGNOSIS — K08409 Partial loss of teeth, unspecified cause, unspecified class: Secondary | ICD-10-CM

## 2019-08-08 DIAGNOSIS — K036 Deposits [accretions] on teeth: Secondary | ICD-10-CM

## 2019-08-08 DIAGNOSIS — K06010 Localized gingival recession, unspecified: Secondary | ICD-10-CM

## 2019-08-08 DIAGNOSIS — K030 Excessive attrition of teeth: Secondary | ICD-10-CM

## 2019-08-08 DIAGNOSIS — J392 Other diseases of pharynx: Secondary | ICD-10-CM

## 2019-08-08 DIAGNOSIS — C321 Malignant neoplasm of supraglottis: Secondary | ICD-10-CM

## 2019-08-08 DIAGNOSIS — K0850 Unsatisfactory restoration of tooth, unspecified: Secondary | ICD-10-CM

## 2019-08-08 DIAGNOSIS — Z01818 Encounter for other preprocedural examination: Secondary | ICD-10-CM

## 2019-08-08 DIAGNOSIS — K0889 Other specified disorders of teeth and supporting structures: Secondary | ICD-10-CM

## 2019-08-08 MED ORDER — SODIUM FLUORIDE 1.1 % DT CREA: TOPICAL_CREAM | DENTAL | 99 refills | Status: DC

## 2019-08-08 MED FILL — DENTA 5000 PLUS CREAM: 1.1 | 30 days supply | Qty: 51 | Fill #0

## 2019-08-08 NOTE — Progress Notes (Signed)
DENTAL CONSULTATION  Date of Consultation:  08/08/2019 Patient Name:   Julian Brown Date of Birth:   01-20-1933 Medical Record Number: [id number]  COVID 19 SCREENING: The patient does not symptoms concerning for COVID-19 infection (Including fever, chills, cough, or new SHORTNESS OF BREATH).    VITALS: BP (!) 153/65 (BP Location: Right Arm)   Pulse 68   Temp 98.2 F (36.8 C)   CHIEF COMPLAINT: Patient referred by Dr. Isidore Moos for dental consultation.  HPI: Julian Brown is an 86 year old male recently diagnosed with malignant neoplasm of the supraglottis.  Patient with anticipated radiation therapy with Dr. Isidore Moos.  Patient is now seen as part of a medically necessary preradiation therapy dental protocol examination.  The patient currently denies acute toothaches, swellings, or abscesses.  Patient was last seen by his primary dentist on February 27, 2018.  This was with Dr. Daneen Schick.  Patient had an exam, cleaning, and repair of a partial denture.  He usually sees the dentist every 6 to 9 months but has been unable to do so due to Covid restrictions.  Patient has an upper and lower partial denture.  These were fabricated  15-20 years ago by Dr. Aggie Moats, who has since retired.  Patient indicates that the partials "fit good". Patient denies having dental phobia.  PROBLEM LIST: Patient Active Problem List   Diagnosis Date Noted  . Malignant neoplasm of supraglottis ([place]) 08/08/2019    Priority: High  . Hoarseness of voice 06/12/2019  . Cough 05/23/2019  . Primary osteoarthritis of right wrist 07/31/2018  . Stage 2 moderate COPD by GOLD classification ([place]) 05/16/2018  . Allergic rhinitis 05/16/2018  . Nocturnal hypoxemia 05/16/2018  . Abnormal finding on lung imaging 05/16/2018  . Hypertension   . Hyperlipemia   . Acute gangrenous cholecystitis s/p lap cholecystectomy 06/08/2017 06/08/2017  . Pain in right wrist 01/04/2017  . Chronic pain of left knee 01/04/2017  . Chronic  pain of right knee 01/04/2017  . Unilateral primary osteoarthritis, left knee 01/04/2017  . Unilateral primary osteoarthritis, right knee 01/04/2017    PMH: Past Medical History:  Diagnosis Date  . Acute gangrenous cholecystitis s/p lap cholecystectomy 06/08/2017 06/08/2017  . Arthritis   . ED (erectile dysfunction)   . Emphysema of lung ([place])   . Hyperlipemia   . Hypertension   . Prostate cancer [facility])    prostate cancer-tx radiation  . Wears glasses     PSH: Past Surgical History:  Procedure Laterality Date  . APPENDECTOMY    . CARPAL TUNNEL RELEASE  5/12   lt  . CARPAL TUNNEL RELEASE  12/13/2011   Procedure: CARPAL TUNNEL RELEASE;  Surgeon: [name]., MD;  Location: [place];  Service: Orthopedics;  Laterality: Right;  and inject right wrist  . CHOLECYSTECTOMY N/A 06/08/2017   Procedure: LAPAROSCOPIC CHOLECYSTECTOMY WITH LYSIS OF ADHESIONS;  Surgeon: [name], MD;  [facility];  Service: General;  Laterality: N/A;  . [place]   hemicolectomy-rt-ca  . COLONOSCOPY     about 12 inches of colon removed due to colon cancer    ALLERGIES: Allergies  Allergen Reactions  . Penicillins Swelling    Has patient had a PCN reaction causing immediate rash, facial/tongue/throat swelling, SOB or lightheadedness with hypotension: no (lip swelling "as soon as the pill touched my mouth so I didn't swallow it") Has patient had a PCN reaction causing severe rash involving mucus membranes or skin necrosis: no Has patient had a  PCN reaction that required hospitalization: no Has patient had a PCN reaction occurring within the last 10 years: no If all of the above answers are "NO", then may proceed with Ceph    MEDICATIONS: Current Outpatient Medications  Medication Sig Dispense Refill  . amLODipine (NORVASC) 10 MG tablet Take 10 mg by mouth daily.    Marland Kitchen aspirin EC 81 MG tablet Take 81 mg by mouth daily.    Marland Kitchen BREO ELLIPTA 100-25 MCG/INH AEPB  INHALE 1 PUFF INTO THE LUNGS ONCE DAILY. 60 each 0  . cholecalciferol (VITAMIN D) 1000 UNITS tablet Take 2,000 Units by mouth daily.     . fluticasone (FLONASE) 50 MCG/ACT nasal spray     . Multiple Vitamins-Minerals (PRESERVISION AREDS 2) CAPS Take by mouth.    . olmesartan-hydrochlorothiazide (BENICAR HCT) 40-12.5 MG tablet Take 1 tablet by mouth daily. Please order from [facility]    . rosuvastatin (CRESTOR) 10 MG tablet Take 10 mg by mouth daily.    Marland Kitchen SPIRIVA RESPIMAT 1.25 MCG/ACT AERS USE 2 PUFFS EACH DAY 4 g 5   No current facility-administered medications for this visit.    LABS: Lab Results  Component Value Date   WBC 8.3 04/23/2019   HGB 15.6 04/23/2019   HCT 45.7 04/23/2019   MCV 98.3 04/23/2019   PLT 303.0 04/23/2019      Component Value Date/Time   NA 133 (L) 06/15/2017 0419   K 3.8 06/15/2017 0419   CL 99 (L) 06/15/2017 0419   CO2 28 06/15/2017 0419   GLUCOSE 95 06/15/2017 0419   BUN 20 06/15/2017 0419   CREATININE 1.01 06/15/2017 0419   CALCIUM 9.0 06/15/2017 0419   GFRNONAA >60 06/15/2017 0419   GFRAA >60 06/15/2017 0419   Lab Results  Component Value Date   INR 1.24 06/13/2017   No results found for: PTT  SOCIAL HISTORY: Social History   Socioeconomic History  . Marital status: Married    Spouse name: Not on file  . Number of children: Not on file  . Years of education: Not on file  . Highest education level: Not on file  Occupational History  . Not on file  Tobacco Use  . Smoking status: Former Smoker    Packs/day: 1.00    Years: 36.00    Pack years: 36.00    Types: Cigarettes    Quit date: 12/09/1986    Years since quitting: 32.6  . Smokeless tobacco: Never Used  Substance and Sexual Activity  . Alcohol use: Yes    Comment: occ  . Drug use: No  . Sexual activity: Not on file  Other Topics Concern  . Not on file  Social History Narrative  . Not on file   Social Determinants of Health   Financial Resource Strain:   . Difficulty of  Paying Living Expenses: Not on file  Food Insecurity:   . Worried About Charity fundraiser in the Last Year: Not on file  . Ran Out of Food in the Last Year: Not on file  Transportation Needs:   . Lack of Transportation (Medical): Not on file  . Lack of Transportation (Non-Medical): Not on file  Physical Activity:   . Days of Exercise per Week: Not on file  . Minutes of Exercise per Session: Not on file  Stress:   . Feeling of Stress : Not on file  Social Connections:   . Frequency of Communication with Friends and Family: Not on file  . Frequency of Social  Gatherings with Friends and Family: Not on file  . Attends Religious Services: Not on file  . Active Member of Clubs or Organizations: Not on file  . Attends Archivist Meetings: Not on file  . Marital Status: Not on file  Intimate Partner Violence:   . Fear of Current or Ex-Partner: Not on file  . Emotionally Abused: Not on file  . Physically Abused: Not on file  . Sexually Abused: Not on file    FAMILY HISTORY: History reviewed. No pertinent family history.  REVIEW OF SYSTEMS: Reviewed with the patient as per History of present illness. Psych: Patient denies having dental phobia.  DENTAL HISTORY: CHIEF COMPLAINT: Patient referred by Dr. Isidore Moos for dental consultation.  HPI: ODEAN BLYE is an 86 year old male recently diagnosed with malignant neoplasm of the supraglottis.  Patient with anticipated radiation therapy with Dr. Isidore Moos.  Patient is now seen as part of a medically necessary preradiation therapy dental protocol examination.  The patient currently denies acute toothaches, swellings, or abscesses.  Patient was last seen by his primary dentist on February 27, 2018.  This was with Dr. Daneen Schick.  Patient had an exam, cleaning, and repair of a partial denture.  He usually sees the dentist every 6 to 9 months but has been unable to do so due to Covid restrictions.  Patient has an upper and lower  partial denture.  These were fabricated  15-20 years ago by Dr. Aggie Moats, who has since retired.  Patient indicates that the partials "fit good". Patient denies having dental phobia.  DENTAL EXAMINATION: GENERAL: The patient is a well-developed, well-nourished male in no acute distress. HEAD AND NECK: There is no right or left neck lymphadenopathy.  The patient denies acute TMJ symptoms.  Maximum interincisal opening is measured at 35 mm. INTRAORAL EXAM: Patient has normal saliva.  There is no evidence of oral abscess formation. DENTITION: Patient is missing tooth numbers 1-3, 11-16, 18-20, 23-26, and 28-32.  There is evidence of maxillary and mandibular incisal attrition. PERIODONTAL: The patient has chronic periodontitis with plaque and calculus accumulations, gingival recession, and moderate bone loss.  Tooth mobility noted as per dental charting form. DENTAL CARIES/SUBOPTIMAL RESTORATIONS: No significant dental caries noted.  There are multiple suboptimal dental restorations could be evaluated for replacement or restored with crowns. ENDODONTIC: The patient currently denies acute pulpitis symptoms.  I do not see any evidence periapical pathology or radiolucency. The patient has had previous root canal therapy associated with tooth #17. CROWN AND BRIDGE: There are no crown restorations.  Patient could be evaluated in the future for multiple crown and bridge restorations. PROSTHODONTIC: Patient has upper and lower cast partial dentures.  These were fabricated approximately 15 to 20 years ago by Dr. Aggie Moats.  These are ill fitting and suboptimal but the patient indicates that they  "fit good". OCCLUSION: Patient has a poor occlusal scheme secondary to multiple missing teeth, generalized maxillary mandibular incisal attrition, and supra eruption and drifting of the unopposed teeth into the edentulous areas.  RADIOGRAPHIC INTERPRETATION: Orthopantogram was taken and supplemented with 9 periapical  radiographs.  These are suboptimal secondary to patient's gag reflex. There are multiple missing teeth.  There is moderate bone loss.  There is supra eruption and drifting of the unopposed teeth into the edentulous areas.  There is evidence of maxillary and mandibular incisal attrition.  Multiple dental restorations are noted.  There is a previous root canal therapy associated with tooth #17.   ASSESSMENTS: 1.  Malignant  neoplasm of the supraglottis 2.  Preradiation therapy dental protocol 3.  Chronic periodontitis with bone loss 4.  Gingival recession 5.  Accretions 6.  Incipient tooth mobility 7.  Maxillary and mandibular incisal attrition 8.  Multiple suboptimal dental restorations 9.  Multiple missing teeth 10.  Supra eruption and drifting of the unopposed teeth into the edentulous areas 11.  Ill fitting maxillary mandibular cast partial dentures  12.  Poor occlusal scheme and malocclusion 13.  Hyperactive gag reflex  PLAN/RECOMMENDATIONS: 1. I discussed the risks, benefits, and complications of various treatment options with the patient in relationship to his medical and dental conditions, anticipated radiation therapy, and radiation therapy side effects to include xerostomia, radiation caries, trismus, mucositis, taste changes, gum and jawbone changes, and risk for infection and osteoradionecrosis. We discussed various treatment options to include no treatment, extraction of teeth in the primary field radiation therapy, alveoloplasty, pre-prosthetic surgery as indicated, periodontal therapy, dental restorations, root canal therapy, crown and bridge therapy, implant therapy, and replacement of missing teeth as indicated.  We also discussed fabrication of fluoride trays and scatter protection devices.  After review of the anticipated ports and doses with Dr. Isidore Moos, it was determined that there are no teeth in the primary radiation therapy and no extractions will be required at this time.   The patient currently wishes to proceed with impressions today for the fabrication of scatter protection devices but wishes to defer proceeding with fluoride trays at this time.  A prescription for Prevident 5000 toothpaste will be sent to [facility] with refills for 1 year with normal brush on directions.  Patient will return to the dental clinic tomorrow for insertion of the scatter protection devices.  Patient will then proceed with simulation appointment with Dr. Isidore Moos as indicated.  Patient is to contact his primary dentist, Dr. Daneen Schick, to schedule a dental cleaning as time and space permits.  Patient will then need to follow-up with Dr. Kalman Shan for continued periodontal maintenance procedures and comprehensive dental care is indicated approximately 3 months after the last radiation treatment has been provided.   2. Discussion of findings with medical team and coordination of future medical and dental care as needed.  I spent in excess of  120 minutes during the conduct of this consultation and >50% of this time involved direct face-to-face encounter for counseling and/or coordination of the patient's care.    Lenn Cal, DDS

## 2019-08-08 NOTE — Patient Instructions (Signed)

## 2019-08-09 ENCOUNTER — Encounter (HOSPITAL_COMMUNITY): Payer: Self-pay | Admitting: Dentistry

## 2019-08-09 ENCOUNTER — Other Ambulatory Visit: Payer: Self-pay

## 2019-08-09 ENCOUNTER — Ambulatory Visit (HOSPITAL_COMMUNITY): Payer: Medicare Other | Admitting: Dentistry

## 2019-08-09 ENCOUNTER — Telehealth: Payer: Self-pay | Admitting: *Deleted

## 2019-08-09 VITALS — BP 136/66 | HR 64 | Temp 98.1°F

## 2019-08-09 DIAGNOSIS — Z463 Encounter for fitting and adjustment of dental prosthetic device: Secondary | ICD-10-CM

## 2019-08-09 DIAGNOSIS — C321 Malignant neoplasm of supraglottis: Secondary | ICD-10-CM | POA: Diagnosis present

## 2019-08-09 DIAGNOSIS — Z01818 Encounter for other preprocedural examination: Secondary | ICD-10-CM

## 2019-08-09 NOTE — Patient Instructions (Signed)
COVID-19 Education: The signs and symptoms of COVID-19 were discussed with the patient and how to seek care for testing (follow up with PCP or arrange E-visit).   The importance of social distancing was discussed today.  COVID-19 Vaccine Information can be found at: ShippingScam.co.uk For questions related to vaccine distribution or appointments, please email [email or web address] or call [phone].   TRISMUS  Trismus is a condition where the jaw does not allow the mouth to open as wide as it usually does.  This can happen almost suddenly, or in other cases the process is so slow, it is hard to notice it-until it is too far along.  When the jaw joints and/or muscles have been exposed to radiation treatments, the onset of Trismus is very slow.  This is because the muscles are losing their stretching ability over a long period of time, as long as 2 YEARS after the end of radiation.  It is therefore important to exercise these muscles and joints.  TRISMUS EXERCISES   Stack of tongue depressors measuring the same or a little less than the last documented MIO (Maximum Interincisal Opening).  Secure them with a rubber band on both ends.  Place the stack in the patient's mouth, supporting the other end.  Allow 30 seconds for muscle stretching.  Rest for a few seconds.  Repeat 3-5 times  For all radiation patients, this exercise is recommended in the mornings and evenings unless otherwise instructed.  The exercise should be done for a period of 2 YEARS after the end of radiation.  MIO should be checked routinely on recall dental visits by the general dentist or the hospital dentist.  The patient is advised to report any changes, soreness, or difficulties encountered when doing the exercises.

## 2019-08-09 NOTE — Telephone Encounter (Addendum)
Oncology Nurse Navigator Documentation  Spoke with Mrs. Schellhase.  Informed her of 3/2 8:00 CT SIM, encouraged 7:40 [place] arrival for registration and arrival to Radiation Waiting.    Explained 3/2 12:00 Nutrition appt will be by telephone.  Confirmed understanding of 3/4 9:30 SLP appt at [facility], encouraged 8:50 arrival for lobby registration to accommodate 9:00 arrival to Radiation Waiting.  Addendum 66:  Spoke with Mrs. Scharpf, informed her of 7:15 arrival on 3/2 for IV Start prior to 8:00 CT SIM.  She voiced understanding.  Gayleen Orem, RN, BSN Head & Neck Oncology Nurse [place] at [place] [phone]

## 2019-08-09 NOTE — Progress Notes (Signed)
Has armband been applied?  Yes  Does patient have an allergy to IV contrast dye?: No   Has patient ever received premedication for IV contrast dye?: N/A  Does patient take metformin?: No  If patient does take metformin when was the last dose: N/A  Date of lab work: 07/19/19 BUN: 11 CR: 0.82 EGFR: 81  IV site: Left AC  Has IV site been added to flowsheet?  Yes  BP (!) 157/69 (BP Location: Left Arm)   Pulse 72   Temp 98.2 F (36.8 C) (Tympanic)   Resp 20   Wt 213 lb 9.6 oz (96.9 kg)   SpO2 93%   BMI 29.79 kg/m

## 2019-08-09 NOTE — Progress Notes (Signed)
08/09/2019  Patient Name:   Julian Brown Date of Birth:   [birth year] Medical Record Number: [id number]   COVID 19 SCREENING: The patient does not symptoms concerning for COVID-19 infection (Including fever, chills, cough, or new SHORTNESS OF BREATH).   BP 136/66 (BP Location: Left Arm)   Pulse 64   Temp 98.1 F (36.7 C)   Julian Brown now presents for insertion of upper and lower scatter protection devices.  PROCEDURE: Appliances were tried in and adjusted as needed. [place] Trismus device was previously fabricated at 35 mm using 20 sticks. Postop instructions were provided and a written and verbal format concerning the use and care of appliances. All questions were answered. Patient did pick up his fluoride at the pharmacy yesterday.  Patient is already started using fluoride at bedtime as instructed. Patient call if he desires periodic oral examination during radiation therapy in the next 2 to 3 weeks.  Otherwise, he will return to dental medicine 1 month after the last radiation treatment has been provided. Patient to call if questions or problems arise before then.   Lenn Cal, DDS

## 2019-08-12 ENCOUNTER — Telehealth: Payer: Self-pay | Admitting: *Deleted

## 2019-08-12 NOTE — Telephone Encounter (Signed)
CALLED PATIENT TO INFORM OF LAB FOR 08-13-19 @ 9:15 AM, SPOKE WITH PATIENT'S WIFE- MARY AND SHE IS AWARE OF THIS APPT.

## 2019-08-13 ENCOUNTER — Encounter: Payer: Self-pay | Admitting: *Deleted

## 2019-08-13 ENCOUNTER — Ambulatory Visit
Admission: RE | Admit: 2019-08-13 | Discharge: 2019-08-13 | Disposition: A | Discharge status: Home / Self Care | Payer: Medicare Other | Source: Ambulatory Visit | Attending: Radiation Oncology | Admitting: Radiation Oncology

## 2019-08-13 ENCOUNTER — Inpatient Hospital Stay: Payer: Medicare Other | Attending: Radiation Oncology | Admitting: Nutrition

## 2019-08-13 ENCOUNTER — Other Ambulatory Visit: Payer: Self-pay

## 2019-08-13 VITALS — BP 157/69 | HR 72 | Temp 98.2°F | Resp 20 | Wt 213.6 lb

## 2019-08-13 DIAGNOSIS — R5381 Other malaise: Secondary | ICD-10-CM

## 2019-08-13 DIAGNOSIS — Z1329 Encounter for screening for other suspected endocrine disorder: Secondary | ICD-10-CM

## 2019-08-13 DIAGNOSIS — C321 Malignant neoplasm of supraglottis: Secondary | ICD-10-CM | POA: Insufficient documentation

## 2019-08-13 DIAGNOSIS — Z510 Encounter for antineoplastic radiation therapy: Secondary | ICD-10-CM | POA: Diagnosis not present

## 2019-08-13 LAB — COMPREHENSIVE METABOLIC PANEL
ALT: 12 U/L (ref 0–44)
AST: 14 U/L — ABNORMAL LOW (ref 15–41)
Albumin: 3.9 g/dL (ref 3.5–5.0)
Alkaline Phosphatase: 57 U/L (ref 38–126)
Anion gap: 10 (ref 5–15)
BUN: 21 mg/dL (ref 8–23)
CO2: 27 mmol/L (ref 22–32)
Calcium: 10 mg/dL (ref 8.9–10.3)
Chloride: 102 mmol/L (ref 98–111)
Creatinine, Ser: 0.93 mg/dL (ref 0.61–1.24)
GFR calc Af Amer: >60 mL/min (ref >60)
GFR calc non Af Amer: >60 mL/min (ref >60)
Glucose, Bld: 100 mg/dL — ABNORMAL HIGH (ref 70–99)
Potassium: 4.1 mmol/L (ref 3.5–5.1)
Sodium: 139 mmol/L (ref 135–145)
Total Bilirubin: 0.5 mg/dL (ref 0.3–1.2)
Total Protein: 7.2 g/dL (ref 6.5–8.1)

## 2019-08-13 LAB — CBC WITH DIFFERENTIAL/PLATELET
Abs Immature Granulocytes: 0.02 10*3/uL (ref 0.00–0.07)
Basophils Absolute: 0.1 10*3/uL (ref 0.0–0.1)
Basophils Relative: 1 %
Eosinophils Absolute: 0 10*3/uL (ref 0.0–0.5)
Eosinophils Relative: 1 %
HCT: 45.8 % (ref 39.0–52.0)
Hemoglobin: 15.7 g/dL (ref 13.0–17.0)
Immature Granulocytes: 0 %
Lymphocytes Relative: 20 %
Lymphs Abs: 1.6 10*3/uL (ref 0.7–4.0)
MCH: 33.4 pg (ref 26.0–34.0)
MCHC: 34.3 g/dL (ref 30.0–36.0)
MCV: 97.4 fL (ref 80.0–100.0)
Monocytes Absolute: 0.8 10*3/uL (ref 0.1–1.0)
Monocytes Relative: 10 %
Neutro Abs: 5.4 10*3/uL (ref 1.7–7.7)
Neutrophils Relative %: 68 %
Platelets: 228 10*3/uL (ref 150–400)
RBC: 4.7 MIL/uL (ref 4.22–5.81)
RDW: 12.2 % (ref 11.5–15.5)
WBC: 7.8 10*3/uL (ref 4.0–10.5)
nRBC: 0 % (ref 0.0–0.2)

## 2019-08-13 LAB — TSH: TSH: 0.494 u[IU]/mL (ref 0.320–4.118)

## 2019-08-13 MED ORDER — SODIUM CHLORIDE 0.9% FLUSH
10.0000 mL | Freq: Once | INTRAVENOUS | Status: AC
  Administered 2019-08-13: 10 mL via INTRAVENOUS

## 2019-08-13 NOTE — Progress Notes (Signed)
86 year old male diagnosed with supraglottis cancer to receive 35 fractions radiation therapy.  He is a patient of Dr. Isidore Moos.  Past medical history includes upper and lower partial dentures, COPD, hypertension, hyperlipidemia, prostate cancer status post radiation therapy, and colon cancer.  Medications include vitamin D, Crestor, multivitamin.  Labs were reviewed.  Height: 5 feet 11 inches. Weight: 213.6 pounds March 2. 207 pounds June 12, 2019. 225 pounds June 27, 2018. BMI: 29.79.  Patient lives with his wife who does most of the cooking.  She would like to be sure she receives written handouts so she understands what to prepare for patient to eat. Patient denies nutrition issues at this time.  He reports he is eating well and chewing and swallowing without difficulty.  Nutrition diagnosis: Predicted suboptimal energy intake related to laryngeal cancer and associated treatments as evidenced by the history or presence of a condition for which research shows an increased incidence of suboptimal energy intake.  Intervention: Educated patient to consume small frequent meals and snacks with higher calorie, high-protein foods. Provided fact sheets. Encouraged oral nutrition supplements as needed. Recommended weight maintenance. Questions were answered.  Teach back method used.  Contact information provided.  Monitoring, evaluation, goals: Patient will tolerate increased calories and protein for weight maintenance.  Next visit:Wednesday, March 24 by telephone.  **Disclaimer: This note was dictated with voice recognition software. Similar sounding words can inadvertently be transcribed and this note may contain transcription errors which may not have been corrected upon publication of note.**

## 2019-08-13 NOTE — Progress Notes (Addendum)
Oncology Nurse Navigator Documentation  To provide support, encouragement and care continuity, met with Mr. Mcfarland during his CT SIM.  Marland Kitchen Provided New Patient Information packet, discussed contents: o Contact information for physician(s), myself, other members of the Care Team. o Advance Directive information ([place] blue pamphlet with LCSW contact info).  Provided [facility] AD booklet per request. o Fall Prevention Patient [facility] campus map with highlight of [place] sheet o Symptom Management Clinic information  He tolerated CT SIM without difficulty, denied questions/concerns.    I toured him to [facility] treatment area, explained procedures for lobby registration, arrival to Radiation Waiting, arrival to tmt area and preparation for tmt.  He voiced understanding.    I provided/reviewed Epic appointment calendar, noted this Thursday's [place] to meet with SLP.  Escorted him to lobby to register for labs following SIM.  I encouraged him to call me prior to 3/10 New Start.  Gayleen Orem, RN, BSN Head & Neck Oncology Nurse [place] at [place] [phone]

## 2019-08-14 ENCOUNTER — Telehealth: Payer: Self-pay | Admitting: *Deleted

## 2019-08-14 NOTE — Telephone Encounter (Signed)
Oncology Nurse Navigator Documentation  Spoke with Julian Brown, confirmed 9:10 [place] arrival for lobby registration and arrival to Radiation Waiting for H&N [facility]/SLP appt.  Gayleen Orem, RN, BSN Head & Neck Oncology Nurse [place] at [place] [phone]

## 2019-08-15 ENCOUNTER — Ambulatory Visit: Payer: Medicare Other | Attending: Radiation Oncology

## 2019-08-15 ENCOUNTER — Other Ambulatory Visit: Payer: Self-pay

## 2019-08-15 ENCOUNTER — Encounter: Payer: Self-pay | Admitting: *Deleted

## 2019-08-15 DIAGNOSIS — R1310 Dysphagia, unspecified: Secondary | ICD-10-CM | POA: Insufficient documentation

## 2019-08-15 NOTE — Therapy (Signed)
[facility] [address] [place], [place], [address] Phone: [phone]   Fax:  [phone]  Speech Language Pathology Evaluation  Patient Details  Name: Julian Brown MRN: [id number] Date of Birth: February 09, 1933 Referring Provider (SLP): Eppie Gibson, MD   Encounter Date: 08/15/2019  End of Session - 08/15/19 1235    Visit Number  1    Number of Visits  4    Date for SLP Re-Evaluation  11/13/19   90 days   SLP Start Time  0930    SLP Stop Time   [id number]    SLP Time Calculation (min)  45 min    Activity Tolerance  Patient tolerated treatment well       Past Medical History:  Diagnosis Date  . Acute gangrenous cholecystitis s/p lap cholecystectomy 06/08/2017 06/08/2017  . Arthritis   . Colon cancer ([place])   . ED (erectile dysfunction)   . Emphysema of lung ([place])   . Hyperlipemia   . Hypertension   . Prostate cancer [facility])    prostate cancer-tx radiation  . Wears glasses     Past Surgical History:  Procedure Laterality Date  . APPENDECTOMY    . CARPAL TUNNEL RELEASE  5/12   lt  . CARPAL TUNNEL RELEASE  12/13/2011   Procedure: CARPAL TUNNEL RELEASE;  Surgeon: [name]., MD;  Location: [facility];  Service: Orthopedics;  Laterality: Right;  and inject right wrist  . CHOLECYSTECTOMY N/A 06/08/2017   Procedure: LAPAROSCOPIC CHOLECYSTECTOMY WITH LYSIS OF ADHESIONS;  Surgeon: [name], MD;  Location: [facility];  Service: General;  Laterality: N/A;  . [place]   hemicolectomy-rt-ca  . COLONOSCOPY     about 12 inches of colon removed due to colon cancer    There were no vitals filed for this visit.      SLP Evaluation [facility] - 08/15/19 0932      SLP Visit Information   SLP Received On  08/15/19    Referring Provider (SLP)  Eppie Gibson, MD    Onset Date  2019    Medical Diagnosis  supraglottic ISCC      Subjective   Patient/Family Stated Goal  Improve swallowing after  treatment back to normal.      General Information   HPI  pt presented to ENT with hoarseness in 2019 without notable findings. May 29 2019 [facility] did laryngoscopy revealing lt vocal fold paralysis of unknown etiology for cause of hoarseness - significant posterior laryngeal edema with possible soft tissue mass. 06-05-19 - CT nect assymetric soft tissue posteriorly on lt vocal fold and 07-12-19 [facility] ([facility]) performed biopsy revealing ISCC.       Prior Functional Status   Cognitive/Linguistic Baseline  Baseline deficits    Baseline deficit details  memory    Type of Home  House     Lives With  Spouse    Available Support  Family      Cognition   Overall Cognitive Status  Impaired/Different from baseline    Area of Impairment  Memory    Memory Comments  pt looked to wife for all questions of history; could not recall if he saw MD yesterday at cancer center    Problem Solving  Slow processing    Attention  Sustained   impaired at times    Awareness  --    Problem Solving  --    Behaviors  --   decr'd  sustained/selective attention skills     Pt currently tolerates regular diet/thin liquids.  POs: Pt ate [place] sandwich and drank H2O without overt s/s aspiration. Thyroid elevation appeared adequate, and swallows appeared timely. Pt's swallow deemed WFL/WNL at this time.   Because data states the risk for dysphagia during and after radiation treatment is high due to undergoing radiation tx, SLP taught pt about the possibility of reduced/limited ability for PO intake during rad tx. SLP encouraged pt to continue swallowing POs as far into rad tx as possible, even ingesting POs and/or completing HEP shortly after administration of pain meds. Among other modifications for days when pt cannot functionally swallow, SLP talked about performing only non-swallowing tasks on the handout/HEP, and then adding swallowing tasks back in when it becomes possible to do so.  SLP educated pt  re: changes to swallowing musculature after rad tx, and why adherence to dysphagia HEP provided today and PO consumption was necessary to inhibit muscular disuse atrophy and to reduce muscle fibrosis following rad tx. Pt demonstrated understanding of these things to SLP.    SLP then developed a HEP for pt and pt was instructed how to perform exercises involving lingual, vocal, and pharyngeal strengthening. SLP performed each exercise and pt return demonstrated each exercise. SLP ensured pt performance was correct prior to moving on to next exercise. Pt was instructed to complete this program 2-3 times a day, until 6 months after his last rad tx, then x2-3 a week after that.                 SLP Education - 08/15/19 1234    Education Details  HEP procedure, late effects head/neck radiation on swallowing, pt likely require wife assistance with HEP    Person(s) Educated  Patient;Spouse    Methods  Explanation;Demonstration;Verbal cues;Handout    Comprehension  Verbalized understanding;Returned demonstration;Verbal cues required;Need further instruction       SLP Short Term Goals - 08/15/19 1245      SLP SHORT TERM GOAL #1   Title  pt will perform HEP with occasional min A    Time  1    Period  --   session   Status  New      SLP SHORT TERM GOAL #2   Title  pt will tell SLP rationale for HEP completion with modified independence    Time  2    Period  --   sessions   Status  New      SLP SHORT TERM GOAL #3   Title  pt and/or wife witll tell SLP 3 overt s/s aspiration PNA    Time  2    Period  --   sessions   Status  New       SLP Long Term Goals - 08/15/19 1246      SLP LONG TERM GOAL #1   Title  pt and/or wife will state how a food journal can foster hastened return to most normalized diet    Time  3    Period  --   sessions   Status  New      SLP LONG TERM GOAL #2   Title  pt will complete HEP for swallowing with rare min A over two sessions    Time  5     Period  --   sessions   Status  New      SLP LONG TERM GOAL #3   Title  pt will tell SLP when  he can decr frequency of HEP after radiation,with modified independence, over two sessions    Time  6    Period  --   sessions   Status  New       Plan - 08/15/19 1236    Clinical Impression Statement  Pt presents today with WNL/WFL swallowing ability with [place] sandiwch and water.  No overt s/sx aspiration PNA reported or observed today. Data suggests that as pts progress through rad therapy that their swallowing ability will decrease. Also, WNL swallowing is threatened by muscle fibrosis that will likely develop after rad is completed. Therefore, SLP developed an indivdualized exercise program to mitigate/eliminate muscle fibrosis following rad tx. Skilled ST would be beneficial to the pt in order to regularly assess pt's safety with POs and/or need for instrumental swallow assessment, as well as to assess proper completion of HEP.    Speech Therapy Frequency  --   once approx every four weeks   Duration  --   3 visits   Treatment/Interventions  Aspiration precaution training;Pharyngeal strengthening exercises;Diet toleration management by SLP;Trials of upgraded texture/liquids;Patient/family education;Internal/external aids;SLP instruction and feedback;Compensatory techniques    Potential to Achieve Goals  Good    Potential Considerations  Ability to learn/carryover information   pt observed slower processing, decr'd memory (looked to wife for questions re: medical history, pt could not recall >2 details from yesterday's visit to cancer center)      Patient will benefit from skilled therapeutic intervention in order to improve the following deficits and impairments:   Dysphagia, unspecified type    Problem List Patient Active Problem List   Diagnosis Date Noted  . Malignant neoplasm of supraglottis ([place]) 08/08/2019  . Hoarseness of voice 06/12/2019  . Cough 05/23/2019  . Primary  osteoarthritis of right wrist 07/31/2018  . Stage 2 moderate COPD by GOLD classification ([place]) 05/16/2018  . Allergic rhinitis 05/16/2018  . Nocturnal hypoxemia 05/16/2018  . Abnormal finding on lung imaging 05/16/2018  . Hypertension   . Hyperlipemia   . Acute gangrenous cholecystitis s/p lap cholecystectomy 06/08/2017 06/08/2017  . Pain in right wrist 01/04/2017  . Chronic pain of left knee 01/04/2017  . Chronic pain of right knee 01/04/2017  . Unilateral primary osteoarthritis, left knee 01/04/2017  . Unilateral primary osteoarthritis, right knee 01/04/2017    [facility] 08/15/2019, 12:49 PM  [facility] [address] [place], [place], [address] Phone: [phone]   Fax:  [phone]  Name: Julian Brown MRN: [id number] Date of Birth: 1932/10/22

## 2019-08-15 NOTE — Patient Instructions (Signed)
SWALLOWING EXERCISES Do these until 6 months after your last day of radiation, then 2-3 times per week afterwards  1. Effortful Swallows - Press your tongue against the roof of your mouth for 3 seconds, then squeeze the muscles in your neck while you swallow your saliva or a sip of water - Repeat 10-15 times, 2-3 times a day, and use whenever you eat or drink  2. Masako Swallow - swallow with your tongue sticking out - Stick tongue out past your teeth and gently bite tongue with your teeth - Swallow, while holding your tongue with your teeth - Repeat 10-15 times, 2-3 times a day *use a wet spoon if your mouth gets dry*  3. Shaker Exercise - head lift - Lie flat on your back in your bed or on a couch without pillows - Raise your head and look at your feet - KEEP YOUR SHOULDERS DOWN - HOLD FOR 45-60 SECONDS, then lower your head back down - Repeat 3 times, 2-3 times a day  4. Mendelsohn Maneuver - "half swallow" exercise - Start to swallow, and keep your Adam's apple up by squeezing hard with the muscles of the throat - Hold the squeeze for 5-7 seconds and then relax - Repeat 10-15 times, 2-3 times a day *use a wet spoon if your mouth gets dry*  5. Chin pushback - Open your mouth  - Place your fist UNDER your chin near your neck - Tuck your chin and push back with your fist for 5 seconds - Repeat 10 times, 2-3 times a day

## 2019-08-16 ENCOUNTER — Other Ambulatory Visit: Payer: Self-pay | Admitting: Internal Medicine

## 2019-08-16 DIAGNOSIS — Z510 Encounter for antineoplastic radiation therapy: Secondary | ICD-10-CM | POA: Diagnosis not present

## 2019-08-19 NOTE — Progress Notes (Signed)
Oncology Nurse Navigator Documentation  Met with Mr. Pohle upon his arrival for H&N [place].  He was accompanied by his wife.  Provided verbal overview of [place], the clinician who will be seeing him, encouraged him to ask questions during his time with them.  He was seen by SLP Garald Balding.  Spoke with them at end of [facility], addressed questions. I encouraged them to call me with needs/concerns.  Gayleen Orem, RN, BSN Head & Neck Oncology [place] at [place] [phone]

## 2019-08-20 ENCOUNTER — Ambulatory Visit: Payer: Medicare Other | Attending: Internal Medicine

## 2019-08-20 DIAGNOSIS — Z23 Encounter for immunization: Secondary | ICD-10-CM | POA: Insufficient documentation

## 2019-08-20 NOTE — Progress Notes (Signed)
   Covid-19 Vaccination Clinic  Name:  [name]    MRN: [id number] DOB: [birth year]  08/20/2019  Mr. Czerwonka was observed post Covid-19 immunization for 15 minutes without incident. He was provided with Vaccine Information Sheet and instruction to access the V-Safe system.   Mr. Manriquez was instructed to call 911 with any severe reactions post vaccine: Marland Kitchen Difficulty breathing  . Swelling of face and throat  . A fast heartbeat  . A bad rash all over body  . Dizziness and weakness   Immunizations Administered    Name Date Dose VIS Date Route   Pfizer COVID-19 Vaccine 08/20/2019  8:34 AM 0.3 mL 05/24/2019 Intramuscular   Manufacturer: [employer]   Lot: [id number]   [place]: [id number]

## 2019-08-21 ENCOUNTER — Ambulatory Visit: Payer: Self-pay

## 2019-08-21 ENCOUNTER — Ambulatory Visit
Admission: RE | Admit: 2019-08-21 | Discharge: 2019-08-21 | Disposition: A | Discharge status: Home / Self Care | Payer: Medicare Other | Source: Ambulatory Visit | Attending: Radiation Oncology | Admitting: Radiation Oncology

## 2019-08-21 ENCOUNTER — Other Ambulatory Visit: Payer: Self-pay

## 2019-08-21 ENCOUNTER — Encounter: Payer: Self-pay | Admitting: *Deleted

## 2019-08-21 DIAGNOSIS — C321 Malignant neoplasm of supraglottis: Secondary | ICD-10-CM

## 2019-08-21 DIAGNOSIS — Z510 Encounter for antineoplastic radiation therapy: Secondary | ICD-10-CM | POA: Diagnosis not present

## 2019-08-21 MED ORDER — SONAFINE EX EMUL
1.0000 "application " | Freq: Once | CUTANEOUS | Status: AC
  Administered 2019-08-21: 1 via TOPICAL

## 2019-08-21 NOTE — Progress Notes (Signed)
Oncology Nurse Navigator Documentation  To provide support, encouragement and care continuity, met with Mr. Pryde for his initial  RT.    I reviewed the 2-step treatment process, answered questions.   He completed tmt without difficulty, denied concerns/questions.  I escorted him Nursing for post-SIM education with RN Anderson [place].  I encouraged them to call me with questions/concerns as tmts proceed.  Gayleen Orem, RN, BSN Head & Neck Oncology Nurse [place] at [place] [phone]

## 2019-08-21 NOTE — Progress Notes (Signed)

## 2019-08-22 ENCOUNTER — Ambulatory Visit
Admission: RE | Admit: 2019-08-22 | Discharge: 2019-08-22 | Disposition: A | Discharge status: Home / Self Care | Payer: Medicare Other | Source: Ambulatory Visit | Attending: Radiation Oncology | Admitting: Radiation Oncology

## 2019-08-22 ENCOUNTER — Other Ambulatory Visit: Payer: Self-pay

## 2019-08-22 DIAGNOSIS — Z510 Encounter for antineoplastic radiation therapy: Secondary | ICD-10-CM | POA: Diagnosis not present

## 2019-08-23 ENCOUNTER — Other Ambulatory Visit: Payer: Self-pay

## 2019-08-23 ENCOUNTER — Ambulatory Visit
Admission: RE | Admit: 2019-08-23 | Discharge: 2019-08-23 | Disposition: A | Discharge status: Home / Self Care | Payer: Medicare Other | Source: Ambulatory Visit | Attending: Radiation Oncology | Admitting: Radiation Oncology

## 2019-08-23 DIAGNOSIS — Z510 Encounter for antineoplastic radiation therapy: Secondary | ICD-10-CM | POA: Diagnosis not present

## 2019-08-26 ENCOUNTER — Ambulatory Visit
Admission: RE | Admit: 2019-08-26 | Discharge: 2019-08-26 | Disposition: A | Discharge status: Home / Self Care | Payer: Medicare Other | Source: Ambulatory Visit | Attending: Radiation Oncology | Admitting: Radiation Oncology

## 2019-08-26 ENCOUNTER — Other Ambulatory Visit: Payer: Self-pay | Admitting: Radiation Oncology

## 2019-08-26 ENCOUNTER — Other Ambulatory Visit: Payer: Self-pay

## 2019-08-26 DIAGNOSIS — Z510 Encounter for antineoplastic radiation therapy: Secondary | ICD-10-CM | POA: Diagnosis not present

## 2019-08-26 DIAGNOSIS — C321 Malignant neoplasm of supraglottis: Secondary | ICD-10-CM

## 2019-08-26 MED ORDER — LIDOCAINE VISCOUS HCL 2 % MT SOLN: OROMUCOSAL | 3 refills | Status: DC

## 2019-08-27 ENCOUNTER — Ambulatory Visit
Admission: RE | Admit: 2019-08-27 | Discharge: 2019-08-27 | Disposition: A | Discharge status: Home / Self Care | Payer: Medicare Other | Source: Ambulatory Visit | Attending: Radiation Oncology | Admitting: Radiation Oncology

## 2019-08-27 ENCOUNTER — Other Ambulatory Visit: Payer: Self-pay

## 2019-08-27 DIAGNOSIS — Z510 Encounter for antineoplastic radiation therapy: Secondary | ICD-10-CM | POA: Diagnosis not present

## 2019-08-28 ENCOUNTER — Other Ambulatory Visit: Payer: Self-pay

## 2019-08-28 ENCOUNTER — Ambulatory Visit
Admission: RE | Admit: 2019-08-28 | Discharge: 2019-08-28 | Disposition: A | Discharge status: Home / Self Care | Payer: Medicare Other | Source: Ambulatory Visit | Attending: Radiation Oncology | Admitting: Radiation Oncology

## 2019-08-28 DIAGNOSIS — Z510 Encounter for antineoplastic radiation therapy: Secondary | ICD-10-CM | POA: Diagnosis not present

## 2019-08-29 ENCOUNTER — Ambulatory Visit
Admission: RE | Admit: 2019-08-29 | Discharge: 2019-08-29 | Disposition: A | Discharge status: Home / Self Care | Payer: Medicare Other | Source: Ambulatory Visit | Attending: Radiation Oncology | Admitting: Radiation Oncology

## 2019-08-29 ENCOUNTER — Other Ambulatory Visit: Payer: Self-pay

## 2019-08-29 DIAGNOSIS — Z510 Encounter for antineoplastic radiation therapy: Secondary | ICD-10-CM | POA: Diagnosis not present

## 2019-08-30 ENCOUNTER — Other Ambulatory Visit: Payer: Self-pay

## 2019-08-30 ENCOUNTER — Ambulatory Visit
Admission: RE | Admit: 2019-08-30 | Discharge: 2019-08-30 | Disposition: A | Discharge status: Home / Self Care | Payer: Medicare Other | Source: Ambulatory Visit | Attending: Radiation Oncology | Admitting: Radiation Oncology

## 2019-08-30 DIAGNOSIS — Z510 Encounter for antineoplastic radiation therapy: Secondary | ICD-10-CM | POA: Diagnosis not present

## 2019-09-02 ENCOUNTER — Ambulatory Visit
Admission: RE | Admit: 2019-09-02 | Discharge: 2019-09-02 | Disposition: A | Discharge status: Home / Self Care | Payer: Medicare Other | Source: Ambulatory Visit | Attending: Radiation Oncology | Admitting: Radiation Oncology

## 2019-09-02 ENCOUNTER — Other Ambulatory Visit: Payer: Self-pay

## 2019-09-02 DIAGNOSIS — Z510 Encounter for antineoplastic radiation therapy: Secondary | ICD-10-CM | POA: Diagnosis not present

## 2019-09-03 ENCOUNTER — Ambulatory Visit
Admission: RE | Admit: 2019-09-03 | Discharge: 2019-09-03 | Disposition: A | Discharge status: Home / Self Care | Payer: Medicare Other | Source: Ambulatory Visit | Attending: Radiation Oncology | Admitting: Radiation Oncology

## 2019-09-03 ENCOUNTER — Other Ambulatory Visit: Payer: Self-pay

## 2019-09-03 DIAGNOSIS — Z510 Encounter for antineoplastic radiation therapy: Secondary | ICD-10-CM | POA: Diagnosis not present

## 2019-09-04 ENCOUNTER — Other Ambulatory Visit: Payer: Self-pay

## 2019-09-04 ENCOUNTER — Ambulatory Visit
Admission: RE | Admit: 2019-09-04 | Discharge: 2019-09-04 | Disposition: A | Discharge status: Home / Self Care | Payer: Medicare Other | Source: Ambulatory Visit | Attending: Radiation Oncology | Admitting: Radiation Oncology

## 2019-09-04 ENCOUNTER — Inpatient Hospital Stay: Payer: Medicare Other | Admitting: Nutrition

## 2019-09-04 DIAGNOSIS — Z510 Encounter for antineoplastic radiation therapy: Secondary | ICD-10-CM | POA: Diagnosis not present

## 2019-09-04 NOTE — Progress Notes (Signed)
Contacted patient by telephone and spoke to patient and wife for follow-up on supraglottis cancer. Patient is tolerating radiation therapy and has completed 11 out of 35 treatments. He is just beginning to report some occasional throat pain. He has denied other nutrition impact symptoms. Weight improved and documented as 216.4 pounds March 22, increased from 213.6 pounds March 2.    Nutrition diagnosis: Predicted suboptimal energy intake continues.  Intervention: Patient educated to continue strategies for increased calories and protein. Reviewed importance of softer foods as needed. Encourage patient to have oral nutrition supplements available when needed.  Monitoring, evaluation, goals: Patient will continue adequate calories and protein for minimal weight loss.  Next visit: Wednesday, March 31 by telephone.  **Disclaimer: This note was dictated with voice recognition software. Similar sounding words can inadvertently be transcribed and this note may contain transcription errors which may not have been corrected upon publication of note.**

## 2019-09-05 ENCOUNTER — Other Ambulatory Visit: Payer: Self-pay

## 2019-09-05 ENCOUNTER — Ambulatory Visit
Admission: RE | Admit: 2019-09-05 | Discharge: 2019-09-05 | Disposition: A | Discharge status: Home / Self Care | Payer: Medicare Other | Source: Ambulatory Visit | Attending: Radiation Oncology | Admitting: Radiation Oncology

## 2019-09-05 DIAGNOSIS — Z510 Encounter for antineoplastic radiation therapy: Secondary | ICD-10-CM | POA: Diagnosis not present

## 2019-09-06 ENCOUNTER — Other Ambulatory Visit: Payer: Self-pay

## 2019-09-06 ENCOUNTER — Ambulatory Visit
Admission: RE | Admit: 2019-09-06 | Discharge: 2019-09-06 | Disposition: A | Discharge status: Home / Self Care | Payer: Medicare Other | Source: Ambulatory Visit | Attending: Radiation Oncology | Admitting: Radiation Oncology

## 2019-09-06 DIAGNOSIS — Z510 Encounter for antineoplastic radiation therapy: Secondary | ICD-10-CM | POA: Diagnosis not present

## 2019-09-09 ENCOUNTER — Ambulatory Visit
Admission: RE | Admit: 2019-09-09 | Discharge: 2019-09-09 | Disposition: A | Discharge status: Home / Self Care | Payer: Medicare Other | Source: Ambulatory Visit | Attending: Radiation Oncology | Admitting: Radiation Oncology

## 2019-09-09 ENCOUNTER — Other Ambulatory Visit: Payer: Self-pay

## 2019-09-09 ENCOUNTER — Other Ambulatory Visit: Payer: Self-pay | Admitting: Radiation Oncology

## 2019-09-09 DIAGNOSIS — C321 Malignant neoplasm of supraglottis: Secondary | ICD-10-CM

## 2019-09-09 DIAGNOSIS — Z510 Encounter for antineoplastic radiation therapy: Secondary | ICD-10-CM | POA: Diagnosis not present

## 2019-09-09 MED ORDER — SUCRALFATE 1 G PO TABS: ORAL_TABLET | ORAL | 5 refills | Status: DC

## 2019-09-10 ENCOUNTER — Ambulatory Visit
Admission: RE | Admit: 2019-09-10 | Discharge: 2019-09-10 | Disposition: A | Discharge status: Home / Self Care | Payer: Medicare Other | Source: Ambulatory Visit | Attending: Radiation Oncology | Admitting: Radiation Oncology

## 2019-09-10 ENCOUNTER — Ambulatory Visit: Payer: Medicare Other | Attending: Internal Medicine

## 2019-09-10 ENCOUNTER — Other Ambulatory Visit: Payer: Self-pay

## 2019-09-10 DIAGNOSIS — Z23 Encounter for immunization: Secondary | ICD-10-CM

## 2019-09-10 DIAGNOSIS — Z510 Encounter for antineoplastic radiation therapy: Secondary | ICD-10-CM | POA: Diagnosis not present

## 2019-09-10 NOTE — Progress Notes (Signed)
   Covid-19 Vaccination Clinic  Name:  Julian Brown    MRN: [id number] DOB: 11/20/1932  09/10/2019  Mr. Julian Brown was observed post Covid-19 immunization for 15 minutes without incident. He was provided with Vaccine Information Sheet and instruction to access the V-Safe system.   Mr. Julian Brown was instructed to call 911 with any severe reactions post vaccine: Marland Kitchen Difficulty breathing  . Swelling of face and throat  . A fast heartbeat  . A bad rash all over body  . Dizziness and weakness   Immunizations Administered    Name Date Dose VIS Date Route   Pfizer COVID-19 Vaccine 09/10/2019  2:17 PM 0.3 mL 05/24/2019 Intramuscular   Manufacturer: [employer]   Lot: [id number]   [place]: [id number]

## 2019-09-11 ENCOUNTER — Inpatient Hospital Stay: Payer: Medicare Other | Admitting: Nutrition

## 2019-09-11 ENCOUNTER — Other Ambulatory Visit: Payer: Self-pay

## 2019-09-11 ENCOUNTER — Encounter: Payer: Self-pay | Admitting: *Deleted

## 2019-09-11 ENCOUNTER — Ambulatory Visit
Admission: RE | Admit: 2019-09-11 | Discharge: 2019-09-11 | Disposition: A | Discharge status: Home / Self Care | Payer: Medicare Other | Source: Ambulatory Visit | Attending: Radiation Oncology | Admitting: Radiation Oncology

## 2019-09-11 DIAGNOSIS — Z510 Encounter for antineoplastic radiation therapy: Secondary | ICD-10-CM | POA: Diagnosis not present

## 2019-09-11 NOTE — Progress Notes (Signed)
I spoke with patient and wife over the telephone. Weight decreased to 210.8 pounds March 29 down from 216.4 pounds March 22. Patient reports he is not surprised that he has lost weight as he has not been able to eat as much. Patient reports increased fatigue and some painful and difficult swallowing. He has tried lidocaine but states it does not help much.  He has not tried Carafate yet.  Nutrition diagnosis: Predicted suboptimal energy intake has evolved into inadequate oral intake related to supraglottis cancer as evidenced by 6 pound weight loss over 1 week  Intervention: Patient will take medications as prescribed for pain and difficult swallowing.  Educated patient to contact MD if this does not improve. Recommended patient try smaller amounts at meals and add high-calorie high-protein shakes in between meals. Provided support and encouragement.  Monitoring, evaluation, goals: Patient will tolerate increased calories and protein to minimize further weight loss.  Next visit: Wednesday, April 7 by telephone.  **Disclaimer: This note was dictated with voice recognition software. Similar sounding words can inadvertently be transcribed and this note may contain transcription errors which may not have been corrected upon publication of note.**

## 2019-09-11 NOTE — Progress Notes (Signed)
Oncology Nurse Navigator Documentation  Confirmed patient's attendance at tomorrow morning's H&N [place].  Wife understands to arrive at 11:00 for registration, that he will be directed to Radiation Waiting where he will be met prior to the start of [place].  Gayleen Orem, RN, BSN Head & Neck Oncology Nurse [place] at [place] [phone]

## 2019-09-12 ENCOUNTER — Ambulatory Visit: Payer: Medicare Other | Attending: Radiation Oncology

## 2019-09-12 ENCOUNTER — Ambulatory Visit
Admission: RE | Admit: 2019-09-12 | Discharge: 2019-09-12 | Disposition: A | Discharge status: Home / Self Care | Payer: Medicare Other | Source: Ambulatory Visit | Attending: Radiation Oncology | Admitting: Radiation Oncology

## 2019-09-12 ENCOUNTER — Other Ambulatory Visit: Payer: Self-pay

## 2019-09-12 DIAGNOSIS — C321 Malignant neoplasm of supraglottis: Secondary | ICD-10-CM | POA: Insufficient documentation

## 2019-09-12 DIAGNOSIS — R1310 Dysphagia, unspecified: Secondary | ICD-10-CM | POA: Diagnosis present

## 2019-09-12 DIAGNOSIS — Z510 Encounter for antineoplastic radiation therapy: Secondary | ICD-10-CM | POA: Diagnosis present

## 2019-09-12 NOTE — Therapy (Signed)
[facility] [address] [place], [place], [address] Phone: [phone]   Fax:  [phone]  Speech Language Pathology Treatment  Patient Details  Name: Julian Brown MRN: [id number] Date of Birth: [birth year] Referring Provider (SLP): Eppie Gibson, MD   Encounter Date: 09/12/2019  End of Session - 09/12/19 1237    Visit Number  2    Number of Visits  4    Date for SLP Re-Evaluation  11/13/19   90 days   SLP Start Time  1140    SLP Stop Time   1225    SLP Time Calculation (min)  45 min    Activity Tolerance  Patient tolerated treatment well       Past Medical History:  Diagnosis Date  . Acute gangrenous cholecystitis s/p lap cholecystectomy 06/08/2017 06/08/2017  . Arthritis   . Colon cancer ([place])   . ED (erectile dysfunction)   . Emphysema of lung ([place])   . Hyperlipemia   . Hypertension   . Prostate cancer [facility])    prostate cancer-tx radiation  . Wears glasses     Past Surgical History:  Procedure Laterality Date  . APPENDECTOMY    . CARPAL TUNNEL RELEASE  5/12   lt  . CARPAL TUNNEL RELEASE  12/13/2011   Procedure: CARPAL TUNNEL RELEASE;  Surgeon: [name]., MD;  Location: [facility];  Service: Orthopedics;  Laterality: Right;  and inject right wrist  . CHOLECYSTECTOMY N/A 06/08/2017   Procedure: LAPAROSCOPIC CHOLECYSTECTOMY WITH LYSIS OF ADHESIONS;  Surgeon: [name], MD;  Location: [facility];  Service: General;  Laterality: N/A;  . [place]   hemicolectomy-rt-ca  . COLONOSCOPY     about 12 inches of colon removed due to colon cancer    There were no vitals filed for this visit.  Subjective Assessment - 09/12/19 1229    Subjective  Pt eats some liquids and some solids. Aphonic.    Patient is accompained by:  Family member   wife [name]           ADULT SLP TREATMENT - 09/12/19 1149      General Information   Behavior/Cognition  Cooperative;Pleasant  mood;Requires cueing;Hard of hearing      Treatment Provided   Treatment provided  Dysphagia      Dysphagia Treatment   Temperature Spikes Noted  No    Respiratory Status  Room air    Oral Cavity - Dentition  Missing dentition   partials   Treatment Methods  Therapeutic exercise;Patient/caregiver education;Compensation strategy training;Skilled observation    Patient observed directly with PO's  Yes    Type of PO's observed  Dysphagia 3 (soft);Thin liquids    Liquids provided via  Cup    Oral Phase Signs & Symptoms  Prolonged mastication    Pharyngeal Phase Signs & Symptoms  --   none noted   Other treatment/comments  Pt ate cheese stick and drank H2O without any overt s/sx aspiration today. He req'd min-mod A occasionally with HEP procedure- wife states pt completes HEP x1/day routinely (rarely x2/day) so SLP reiterated need to complete BID. Strangely, when SLP asked pt's frequency of HEP completion pt shook his head, indicating he was NOT completing his HEP at all. SLP ?s pt's cognitive communication status due to this and also his level of cueing necessary if he was completing HEP x1/day. SLP educated pt and wife that pt could in fact take pain meds prior to  completion of HEP, and he may need to pare down the HEP to non-swallowing exercises during the next 4 weeks and then add swallowing exercises back in as he is able. SLP had to cue pt to rationale for HEP.       Progression Toward Goals   Progression toward goals  Progressing toward goals       SLP Education - 09/12/19 1207    Education Details  taking pain meds prior to HEP/meals, HEP procedure    Person(s) Educated  Patient;Spouse    Methods  Explanation;Handout;Demonstration;Verbal cues    Comprehension  Verbalized understanding;Returned demonstration;Verbal cues required;Need further instruction       SLP Short Term Goals - 09/12/19 1237      SLP SHORT TERM GOAL #1   Title  pt will perform HEP with occasional min A     Time  --    Period  --    Status  Partially Met      SLP SHORT TERM GOAL #2   Title  pt will tell SLP rationale for HEP completion with modified independence    Time  1    Period  --   sessions   Status  On-going      SLP SHORT TERM GOAL #3   Title  pt and/or wife witll tell SLP 3 overt s/s aspiration PNA    Time  1    Period  --   sessions   Status  On-going       SLP Long Term Goals - 09/12/19 1239      SLP LONG TERM GOAL #1   Title  pt and/or wife will state how a food journal can foster hastened return to most normalized diet    Time  2    Period  --   sessions   Status  On-going      SLP LONG TERM GOAL #2   Title  pt will complete HEP for swallowing with rare min A over two sessions    Time  4    Period  --   sessions   Status  On-going      SLP LONG TERM GOAL #3   Title  pt will tell SLP when he can decr frequency of HEP after radiation,with modified independence, over two sessions    Time  5    Period  --   sessions   Status  On-going       Plan - 09/12/19 1237    Clinical Impression Statement  Pt presents today with WNL/WFL swallowing ability with cheese stick and water.  No overt s/sx aspiration PNA reported or observed today. See "other comments" for more details. Data suggests that as pts progress through rad therapy that their swallowing ability will decrease. Also, WNL swallowing is threatened by muscle fibrosis that will likely develop after rad is completed. Therefore, SLP developed an indivdualized exercise program to mitigate/eliminate muscle fibrosis following rad tx. Skilled ST would be beneficial to the pt in order to regularly assess pt's safety with POs and/or need for instrumental swallow assessment, as well as to assess proper completion of HEP.    Speech Therapy Frequency  --   once approx every four weeks   Duration  --   3 visits   Treatment/Interventions  Aspiration precaution training;Pharyngeal strengthening exercises;Diet toleration  management by SLP;Trials of upgraded texture/liquids;Patient/family education;Internal/external aids;SLP instruction and feedback;Compensatory techniques    Potential to Achieve Goals  Good    Potential Considerations  Ability to learn/carryover information   pt observed slower processing, decr'd memory (looked to wife for questions re: medical history, pt could not recall >2 details from yesterday's visit to cancer center)      Patient will benefit from skilled therapeutic intervention in order to improve the following deficits and impairments:   Dysphagia, unspecified type    Problem List Patient Active Problem List   Diagnosis Date Noted  . Malignant neoplasm of supraglottis ([place]) 08/08/2019  . Hoarseness of voice 06/12/2019  . Cough 05/23/2019  . Primary osteoarthritis of right wrist 07/31/2018  . Stage 2 moderate COPD by GOLD classification ([place]) 05/16/2018  . Allergic rhinitis 05/16/2018  . Nocturnal hypoxemia 05/16/2018  . Abnormal finding on lung imaging 05/16/2018  . Hypertension   . Hyperlipemia   . Acute gangrenous cholecystitis s/p lap cholecystectomy 06/08/2017 06/08/2017  . Pain in right wrist 01/04/2017  . Chronic pain of left knee 01/04/2017  . Chronic pain of right knee 01/04/2017  . Unilateral primary osteoarthritis, left knee 01/04/2017  . Unilateral primary osteoarthritis, right knee 01/04/2017    [facility], CCC-SLP  09/12/2019, 12:40 PM  [facility] [address] [place], [place], [address] Phone: [phone]   Fax:  [phone]   Name: Julian Brown MRN: [id number] Date of Birth: [birth year]

## 2019-09-12 NOTE — Progress Notes (Signed)
Oncology Nurse Navigator Documentation  Met with patient after his [place] appointment with Glendell Docker SLP. I reinforced SLP exercises to continue to prevent swallowing concerns related to radiation therapy. I verbalized that I was available to answer any questions/ concerns as needed.

## 2019-09-13 ENCOUNTER — Other Ambulatory Visit: Payer: Self-pay

## 2019-09-13 ENCOUNTER — Ambulatory Visit
Admission: RE | Admit: 2019-09-13 | Discharge: 2019-09-13 | Disposition: A | Discharge status: Home / Self Care | Payer: Medicare Other | Source: Ambulatory Visit | Attending: Radiation Oncology | Admitting: Radiation Oncology

## 2019-09-13 DIAGNOSIS — C321 Malignant neoplasm of supraglottis: Secondary | ICD-10-CM | POA: Diagnosis not present

## 2019-09-16 ENCOUNTER — Other Ambulatory Visit: Payer: Self-pay

## 2019-09-16 ENCOUNTER — Ambulatory Visit
Admission: RE | Admit: 2019-09-16 | Discharge: 2019-09-16 | Disposition: A | Discharge status: Home / Self Care | Payer: Medicare Other | Source: Ambulatory Visit | Attending: Radiation Oncology | Admitting: Radiation Oncology

## 2019-09-16 DIAGNOSIS — C321 Malignant neoplasm of supraglottis: Secondary | ICD-10-CM | POA: Diagnosis not present

## 2019-09-17 ENCOUNTER — Ambulatory Visit
Admission: RE | Admit: 2019-09-17 | Discharge: 2019-09-17 | Disposition: A | Discharge status: Home / Self Care | Payer: Medicare Other | Source: Ambulatory Visit | Attending: Radiation Oncology | Admitting: Radiation Oncology

## 2019-09-17 ENCOUNTER — Other Ambulatory Visit: Payer: Self-pay

## 2019-09-17 DIAGNOSIS — C321 Malignant neoplasm of supraglottis: Secondary | ICD-10-CM | POA: Diagnosis not present

## 2019-09-18 ENCOUNTER — Inpatient Hospital Stay: Payer: Medicare Other | Attending: Radiation Oncology | Admitting: Nutrition

## 2019-09-18 ENCOUNTER — Other Ambulatory Visit: Payer: Self-pay

## 2019-09-18 ENCOUNTER — Ambulatory Visit
Admission: RE | Admit: 2019-09-18 | Discharge: 2019-09-18 | Disposition: A | Discharge status: Home / Self Care | Payer: Medicare Other | Source: Ambulatory Visit | Attending: Radiation Oncology | Admitting: Radiation Oncology

## 2019-09-18 ENCOUNTER — Ambulatory Visit: Payer: Medicare Other | Admitting: Pulmonary Disease

## 2019-09-18 DIAGNOSIS — C321 Malignant neoplasm of supraglottis: Secondary | ICD-10-CM | POA: Diagnosis not present

## 2019-09-18 DIAGNOSIS — R1310 Dysphagia, unspecified: Secondary | ICD-10-CM | POA: Insufficient documentation

## 2019-09-18 NOTE — Progress Notes (Signed)
Nutrition follow-up completed with patient over the telephone. Weight decreased and documented as 207.2 pounds on April 6 down from 216.4 pounds March 22.   Patient has completed 21 out of 35 radiation treatments. Patient states that he is trying to eat but he is not always hungry.  Nutrition diagnosis: Inadequate oral intake continues.  Intervention: Educated patient and wife on importance of increasing calories and protein to minimize further weight loss.   Reviewed strategies for adding calories and protein to meals. Encourage patient to drink 1 milkshake and 3-4 oral nutrition supplements daily.  Monitoring, evaluation, goals: Patient will increase calories and protein to minimize weight loss.  Next visit: Wednesday, April 14.  **Disclaimer: This note was dictated with voice recognition software. Similar sounding words can inadvertently be transcribed and this note may contain transcription errors which may not have been corrected upon publication of note.**

## 2019-09-19 ENCOUNTER — Other Ambulatory Visit: Payer: Self-pay

## 2019-09-19 ENCOUNTER — Ambulatory Visit
Admission: RE | Admit: 2019-09-19 | Discharge: 2019-09-19 | Disposition: A | Discharge status: Home / Self Care | Payer: Medicare Other | Source: Ambulatory Visit | Attending: Radiation Oncology | Admitting: Radiation Oncology

## 2019-09-19 DIAGNOSIS — C321 Malignant neoplasm of supraglottis: Secondary | ICD-10-CM | POA: Diagnosis not present

## 2019-09-20 ENCOUNTER — Ambulatory Visit
Admission: RE | Admit: 2019-09-20 | Discharge: 2019-09-20 | Disposition: A | Discharge status: Home / Self Care | Payer: Medicare Other | Source: Ambulatory Visit | Attending: Radiation Oncology | Admitting: Radiation Oncology

## 2019-09-20 ENCOUNTER — Other Ambulatory Visit: Payer: Self-pay

## 2019-09-20 DIAGNOSIS — C321 Malignant neoplasm of supraglottis: Secondary | ICD-10-CM | POA: Diagnosis not present

## 2019-09-23 ENCOUNTER — Other Ambulatory Visit: Payer: Self-pay | Admitting: Radiation Oncology

## 2019-09-23 ENCOUNTER — Ambulatory Visit
Admission: RE | Admit: 2019-09-23 | Discharge: 2019-09-23 | Disposition: A | Discharge status: Home / Self Care | Payer: Medicare Other | Source: Ambulatory Visit | Attending: Radiation Oncology | Admitting: Radiation Oncology

## 2019-09-23 ENCOUNTER — Other Ambulatory Visit: Payer: Self-pay

## 2019-09-23 DIAGNOSIS — C321 Malignant neoplasm of supraglottis: Secondary | ICD-10-CM | POA: Diagnosis not present

## 2019-09-23 DIAGNOSIS — G893 Neoplasm related pain (acute) (chronic): Secondary | ICD-10-CM

## 2019-09-23 MED ORDER — GABAPENTIN 300 MG PO CAPS: 300.0000 mg | ORAL_CAPSULE | Freq: Three times a day (TID) | ORAL | 1 refills | Status: DC

## 2019-09-24 ENCOUNTER — Ambulatory Visit
Admission: RE | Admit: 2019-09-24 | Discharge: 2019-09-24 | Disposition: A | Discharge status: Home / Self Care | Payer: Medicare Other | Source: Ambulatory Visit | Attending: Radiation Oncology | Admitting: Radiation Oncology

## 2019-09-24 ENCOUNTER — Other Ambulatory Visit: Payer: Self-pay

## 2019-09-24 DIAGNOSIS — C321 Malignant neoplasm of supraglottis: Secondary | ICD-10-CM | POA: Diagnosis not present

## 2019-09-25 ENCOUNTER — Inpatient Hospital Stay: Payer: Medicare Other | Admitting: Nutrition

## 2019-09-25 ENCOUNTER — Ambulatory Visit
Admission: RE | Admit: 2019-09-25 | Discharge: 2019-09-25 | Disposition: A | Discharge status: Home / Self Care | Payer: Medicare Other | Source: Ambulatory Visit | Attending: Radiation Oncology | Admitting: Radiation Oncology

## 2019-09-25 ENCOUNTER — Other Ambulatory Visit: Payer: Self-pay

## 2019-09-25 DIAGNOSIS — C321 Malignant neoplasm of supraglottis: Secondary | ICD-10-CM | POA: Diagnosis not present

## 2019-09-25 NOTE — Progress Notes (Signed)
Nutrition follow-up completed with patient over the telephone.  I also spoke with patient's wife. Weight decreased and documented as 205.4 pounds on April 12.   Patient has been experiencing decreased taste sensation. His throat is sore and he is trying to drink more milkshakes, oral nutrition supplements, and eat soft foods. Wife reports patient ate more dinner last night than he has in several weeks and she attributes this to gabapentin.  Nutrition diagnosis: Inadequate oral intake continues.  Intervention: Provided support and encouragement for patient to continue strategies for increased oral intake. Recommended patient consume a total of 3 oral nutrition supplements or milkshakes or some combination of the 2.  Monitoring, evaluation, goals: Patient will tolerate adequate calories and protein to minimize weight loss.  Next visit: Wednesday, April 21 by telephone.  **Disclaimer: This note was dictated with voice recognition software. Similar sounding words can inadvertently be transcribed and this note may contain transcription errors which may not have been corrected upon publication of note.**

## 2019-09-26 ENCOUNTER — Ambulatory Visit
Admission: RE | Admit: 2019-09-26 | Discharge: 2019-09-26 | Disposition: A | Discharge status: Home / Self Care | Payer: Medicare Other | Source: Ambulatory Visit | Attending: Radiation Oncology | Admitting: Radiation Oncology

## 2019-09-26 ENCOUNTER — Other Ambulatory Visit: Payer: Self-pay

## 2019-09-26 DIAGNOSIS — C321 Malignant neoplasm of supraglottis: Secondary | ICD-10-CM | POA: Diagnosis not present

## 2019-09-27 ENCOUNTER — Ambulatory Visit
Admission: RE | Admit: 2019-09-27 | Discharge: 2019-09-27 | Disposition: A | Discharge status: Home / Self Care | Payer: Medicare Other | Source: Ambulatory Visit | Attending: Radiation Oncology | Admitting: Radiation Oncology

## 2019-09-27 ENCOUNTER — Other Ambulatory Visit: Payer: Self-pay

## 2019-09-27 DIAGNOSIS — C321 Malignant neoplasm of supraglottis: Secondary | ICD-10-CM | POA: Diagnosis not present

## 2019-09-30 ENCOUNTER — Other Ambulatory Visit: Payer: Self-pay | Admitting: Radiation Oncology

## 2019-09-30 ENCOUNTER — Other Ambulatory Visit: Payer: Self-pay

## 2019-09-30 ENCOUNTER — Ambulatory Visit
Admission: RE | Admit: 2019-09-30 | Discharge: 2019-09-30 | Disposition: A | Discharge status: Home / Self Care | Payer: Medicare Other | Source: Ambulatory Visit | Attending: Radiation Oncology | Admitting: Radiation Oncology

## 2019-09-30 DIAGNOSIS — C321 Malignant neoplasm of supraglottis: Secondary | ICD-10-CM

## 2019-09-30 MED ORDER — HYDROCODONE-ACETAMINOPHEN 7.5-325 MG/15ML PO SOLN: 15.0000 mL | Freq: Three times a day (TID) | ORAL | 0 refills | Status: DC | PRN

## 2019-09-30 MED ORDER — DOCUSATE SODIUM 100 MG PO CAPS: 200.0000 mg | ORAL_CAPSULE | Freq: Every day | ORAL | 1 refills | Status: DC

## 2019-10-01 ENCOUNTER — Ambulatory Visit
Admission: RE | Admit: 2019-10-01 | Discharge: 2019-10-01 | Disposition: A | Discharge status: Home / Self Care | Payer: Medicare Other | Source: Ambulatory Visit | Attending: Radiation Oncology | Admitting: Radiation Oncology

## 2019-10-01 ENCOUNTER — Other Ambulatory Visit: Payer: Self-pay

## 2019-10-01 DIAGNOSIS — C321 Malignant neoplasm of supraglottis: Secondary | ICD-10-CM | POA: Diagnosis not present

## 2019-10-02 ENCOUNTER — Inpatient Hospital Stay: Payer: Medicare Other | Admitting: Nutrition

## 2019-10-02 ENCOUNTER — Other Ambulatory Visit: Payer: Self-pay

## 2019-10-02 ENCOUNTER — Ambulatory Visit
Admission: RE | Admit: 2019-10-02 | Discharge: 2019-10-02 | Disposition: A | Discharge status: Home / Self Care | Payer: Medicare Other | Source: Ambulatory Visit | Attending: Radiation Oncology | Admitting: Radiation Oncology

## 2019-10-02 DIAGNOSIS — C321 Malignant neoplasm of supraglottis: Secondary | ICD-10-CM | POA: Diagnosis not present

## 2019-10-02 NOTE — Progress Notes (Signed)
Nutrition follow-up completed with patient and wife.  Patient is struggling to eat foods and wants to know if he can drink more Ensure. Last weight documented was 205.4 pounds on April 19.  This was stable from weight on April 12.  Patient has pain medication which he is hoping will allow him to eat more.  Nutrition diagnosis: Inadequate oral intake continues.  Intervention:  Patient educated to continue strategies for increased oral intake. Patient would require 6 bottles of Ensure plus to meet minimum estimated nutrition needs. Wife is doubtful patient will be able to consume this amount however she is supportive.  Monitoring, evaluation, goals: Patient will work to increase oral intake.  Next visit: Patient to contact me with questions or concerns.  **Disclaimer: This note was dictated with voice recognition software. Similar sounding words can inadvertently be transcribed and this note may contain transcription errors which may not have been corrected upon publication of note.**

## 2019-10-03 ENCOUNTER — Ambulatory Visit
Admission: RE | Admit: 2019-10-03 | Discharge: 2019-10-03 | Disposition: A | Discharge status: Home / Self Care | Payer: Medicare Other | Source: Ambulatory Visit | Attending: Radiation Oncology | Admitting: Radiation Oncology

## 2019-10-03 ENCOUNTER — Other Ambulatory Visit: Payer: Self-pay

## 2019-10-03 DIAGNOSIS — C321 Malignant neoplasm of supraglottis: Secondary | ICD-10-CM | POA: Diagnosis not present

## 2019-10-04 ENCOUNTER — Other Ambulatory Visit: Payer: Self-pay

## 2019-10-04 ENCOUNTER — Ambulatory Visit
Admission: RE | Admit: 2019-10-04 | Discharge: 2019-10-04 | Disposition: A | Discharge status: Home / Self Care | Payer: Medicare Other | Source: Ambulatory Visit | Attending: Radiation Oncology | Admitting: Radiation Oncology

## 2019-10-04 ENCOUNTER — Inpatient Hospital Stay: Payer: Medicare Other

## 2019-10-04 VITALS — BP 141/55 | HR 75 | Temp 98.5°F | Resp 20

## 2019-10-04 DIAGNOSIS — C321 Malignant neoplasm of supraglottis: Secondary | ICD-10-CM

## 2019-10-04 DIAGNOSIS — R1310 Dysphagia, unspecified: Secondary | ICD-10-CM | POA: Diagnosis present

## 2019-10-04 LAB — BASIC METABOLIC PANEL - CANCER CENTER ONLY
Anion gap: 12 (ref 5–15)
BUN: 20 mg/dL (ref 8–23)
CO2: 28 mmol/L (ref 22–32)
Calcium: 10.2 mg/dL (ref 8.9–10.3)
Chloride: 101 mmol/L (ref 98–111)
Creatinine: 0.95 mg/dL (ref 0.61–1.24)
GFR, Est AFR Am: >60 mL/min (ref >60)
GFR, Estimated: >60 mL/min (ref >60)
Glucose, Bld: 135 mg/dL — ABNORMAL HIGH (ref 70–99)
Potassium: 3.9 mmol/L (ref 3.5–5.1)
Sodium: 141 mmol/L (ref 135–145)

## 2019-10-04 MED ORDER — SODIUM CHLORIDE 0.9 % IV SOLN
Freq: Once | INTRAVENOUS | Status: AC
  Filled 2019-10-04: qty 250

## 2019-10-04 NOTE — Progress Notes (Signed)
Patient seen by Dr. Isidore Moos today d/t new onset of difficulty swallowing and increased risk for dehydration. Per Dr. Isidore Moos: patient should receive 1L-NS over 2 hours either today or tomorrow (schedule permitting), and then: Monday, Wednesday, Friday for the next 3 weeks (or until patient feels he's improved and having adequate oral intake).   Patient able to be worked in today for first round of IVFs. High priority scheduling message sent for subsequent IVF appointments. Fluid orders added under supportive treatment plan. Monica Martin-RN updated on plan and will give patient new schedule if future appointments are made before patient leaves. Patient and wife escorted up to infusion waiting area, and know to call clinic if they have any other questions/concerns.

## 2019-10-04 NOTE — Patient Instructions (Signed)
Dehydration, Adult Dehydration is condition in which there is not enough water or other fluids in the body. This happens when a person loses more fluids than he or she takes in. Important body parts cannot work right without the right amount of fluids. Any loss of fluids from the body can cause dehydration. Dehydration can be mild, worse, or very bad. It should be treated right away to keep it from getting very bad. What are the causes? This condition may be caused by:  Conditions that cause loss of water or other fluids, such as: ? Watery poop (diarrhea). ? Vomiting. ? Sweating a lot. ? Peeing (urinating) a lot.  Not drinking enough fluids, especially when you: ? Are ill. ? Are doing things that take a lot of energy to do.  Other illnesses and conditions, such as fever or infection.  Certain medicines, such as medicines that take extra fluid out of the body (diuretics).  Lack of safe drinking water.  Not being able to get enough water and food. What increases the risk? The following factors may make you more likely to develop this condition:  Having a long-term (chronic) illness that has not been treated the right way, such as: ? Diabetes. ? Heart disease. ? Kidney disease.  Being 65 years of age or older.  Having a disability.  Living in a place that is high above the ground or sea (high in altitude). The thinner, dried air causes more fluid loss.  Doing exercises that put stress on your body for a long time. What are the signs or symptoms? Symptoms of dehydration depend on how bad it is. Mild or worse dehydration  Thirst.  Dry lips or dry mouth.  Feeling dizzy or light-headed, especially when you stand up from sitting.  Muscle cramps.  Your body making: ? Dark pee (urine). Pee may be the color of tea. ? Less pee than normal. ? Less tears than normal.  Headache. Very bad dehydration  Changes in skin. Skin may: ? Be cold to the touch (clammy). ? Be blotchy  or pale. ? Not go back to normal right after you lightly pinch it and let it go.  Little or no tears, pee, or sweat.  Changes in vital signs, such as: ? Fast breathing. ? Low blood pressure. ? Weak pulse. ? Pulse that is more than 100 beats a minute when you are sitting still.  Other changes, such as: ? Feeling very thirsty. ? Eyes that look hollow (sunken). ? Cold hands and feet. ? Being mixed up (confused). ? Being very tired (lethargic) or having trouble waking from sleep. ? Short-term weight loss. ? Loss of consciousness. How is this treated? Treatment for this condition depends on how bad it is. Treatment should start right away. Do not wait until your condition gets very bad. Very bad dehydration is an emergency. You will need to go to a hospital.  Mild or worse dehydration can be treated at home. You may be asked to: ? Drink more fluids. ? Drink an oral rehydration solution (ORS). This drink helps get the right amounts of fluids and salts and minerals in the blood (electrolytes).  Very bad dehydration can be treated: ? With fluids through an IV tube. ? By getting normal levels of salts and minerals in your blood. This is often done by giving salts and minerals through a tube. The tube is passed through your nose and into your stomach. ? By treating the root cause. Follow these instructions at   home: Oral rehydration solution If told by your doctor, drink an ORS:  Make an ORS. Use instructions on the package.  Start by drinking small amounts, about  cup (120 mL) every 5-10 minutes.  Slowly drink more until you have had the amount that your doctor said to have. Eating and drinking         Drink enough clear fluid to keep your pee pale yellow. If you were told to drink an ORS, finish the ORS first. Then, start slowly drinking other clear fluids. Drink fluids such as: ? Water. Do not drink only water. Doing that can make the salt (sodium) level in your body get too  low. ? Water from ice chips you suck on. ? Fruit juice that you have added water to (diluted). ? Low-calorie sports drinks.  Eat foods that have the right amounts of salts and minerals, such as: ? Bananas. ? Oranges. ? Potatoes. ? Tomatoes. ? Spinach.  Do not drink alcohol.  Avoid: ? Drinks that have a lot of sugar. These include:  High-calorie sports drinks.  Fruit juice that you did not add water to.  Soda.  Caffeine. ? Foods that are greasy or have a lot of fat or sugar. General instructions  Take over-the-counter and prescription medicines only as told by your doctor.  Do not take salt tablets. Doing that can make the salt level in your body get too high.  Return to your normal activities as told by your doctor. Ask your doctor what activities are safe for you.  Keep all follow-up visits as told by your doctor. This is important. Contact a doctor if:  You have pain in your belly (abdomen) and the pain: ? Gets worse. ? Stays in one place.  You have a rash.  You have a stiff neck.  You get angry or annoyed (irritable) more easily than normal.  You are more tired or have a harder time waking than normal.  You feel: ? Weak or dizzy. ? Very thirsty. Get help right away if you have:  Any symptoms of very bad dehydration.  Symptoms of vomiting, such as: ? You cannot eat or drink without vomiting. ? Your vomiting gets worse or does not go away. ? Your vomit has blood or green stuff in it.  Symptoms that get worse with treatment.  A fever.  A very bad headache.  Problems with peeing or pooping (having a bowel movement), such as: ? Watery poop that gets worse or does not go away. ? Blood in your poop (stool). This may cause poop to look black and tarry. ? Not peeing in 6-8 hours. ? Peeing only a small amount of very dark pee in 6-8 hours.  Trouble breathing. These symptoms may be an emergency. Do not wait to see if the symptoms will go away. Get  medical help right away. Call your local emergency services (911 in the [place] Do not drive yourself to the hospital. Summary  Dehydration is a condition in which there is not enough water or other fluids in the body. This happens when a person loses more fluids than he or she takes in.  Treatment for this condition depends on how bad it is. Treatment should be started right away. Do not wait until your condition gets very bad.  Drink enough clear fluid to keep your pee pale yellow. If you were told to drink an oral rehydration solution (ORS), finish the ORS first. Then, start slowly drinking other clear fluids.  Take over-the-counter and prescription medicines only as told by your doctor.  Get help right away if you have any symptoms of very bad dehydration. This information is not intended to replace advice given to you by your health care provider. Make sure you discuss any questions you have with your health care provider. Document Revised: 01/10/2019 Document Reviewed: 01/10/2019 Elsevier Patient Education  2020 [place] (COVID-19) Are you at risk?  Are you at risk for the Coronavirus (COVID-19)?  To be considered HIGH RISK for Coronavirus (COVID-19), you have to meet the following criteria:  . Traveled to [place], [place], [place], [place] or [place]; or in the [place] to [place], [place], [place], or [place]; and have fever, cough, and shortness of breath within the last 2 weeks of travel OR . Been in close contact with a person diagnosed with COVID-19 within the last 2 weeks and have fever, cough, and shortness of breath . IF YOU DO NOT MEET THESE CRITERIA, YOU ARE CONSIDERED LOW RISK FOR COVID-19.  What to do if you are HIGH RISK for COVID-19?  Marland Kitchen If you are having a medical emergency, call 911. . Seek medical care right away. Before you go to a doctor's office, urgent care or emergency department, call ahead and tell them about your recent travel,  contact with someone diagnosed with COVID-19, and your symptoms. You should receive instructions from your physician's office regarding next steps of care.  . When you arrive at healthcare provider, tell the healthcare staff immediately you have returned from visiting [place], [place], [place], [place] or [place]; or traveled in the [place] to [place], [place], [place], or [place]; in the last two weeks or you have been in close contact with a person diagnosed with COVID-19 in the last 2 weeks.   . Tell the health care staff about your symptoms: fever, cough and shortness of breath. . After you have been seen by a medical provider, you will be either: o Tested for (COVID-19) and discharged home on quarantine except to seek medical care if symptoms worsen, and asked to  - Stay home and avoid contact with others until you get your results (4-5 days)  - Avoid travel on public transportation if possible (such as bus, train, or airplane) or o Sent to the Emergency Department by EMS for evaluation, COVID-19 testing, and possible admission depending on your condition and test results.  What to do if you are LOW RISK for COVID-19?  Reduce your risk of any infection by using the same precautions used for avoiding the common cold or flu:  Marland Kitchen Wash your hands often with soap and warm water for at least 20 seconds.  If soap and water are not readily available, use an alcohol-based hand sanitizer with at least 60% alcohol.  . If coughing or sneezing, cover your mouth and nose by coughing or sneezing into the elbow areas of your shirt or coat, into a tissue or into your sleeve (not your hands). . Avoid shaking hands with others and consider head nods or verbal greetings only. . Avoid touching your eyes, nose, or mouth with unwashed hands.  . Avoid close contact with people who are sick. . Avoid places or events with large numbers of people in one location, like concerts or sporting events. . Carefully  consider travel plans you have or are making. . If you are planning any travel outside or inside the [place], visit the CDC's Travelers' Health  webpage for the latest health notices. . If you have some symptoms but not all symptoms, continue to monitor at home and seek medical attention if your symptoms worsen. . If you are having a medical emergency, call 911.   [place] / e-Visit: [email or web address]         [facility]: [facility]: [id number]                   [facility]: [phone]

## 2019-10-04 NOTE — Progress Notes (Signed)
Oncology Nurse Navigator Documentation  I spoke to Mr. Julian Brown wife Julian Brown on the phone this morning. I informed her that Julian Brown is scheduled for a lab today at 11:00, I advised them to arrive at 10:45 to allow time for COVID screening and registration. He will then receive his lab, radiation treatment, and see Dr. Isidore Moos for assessment after radiation. She voiced her understanding of the above and verbalized to me that they will be here for those appointments.   [name] RN, BSN, OCN Head & Neck Oncology Nurse [place] at [facility] Phone # [phone]  Fax # [phone]

## 2019-10-07 ENCOUNTER — Inpatient Hospital Stay: Payer: Medicare Other

## 2019-10-07 ENCOUNTER — Ambulatory Visit
Admission: RE | Admit: 2019-10-07 | Discharge: 2019-10-07 | Disposition: A | Discharge status: Home / Self Care | Payer: Medicare Other | Source: Ambulatory Visit | Attending: Radiation Oncology | Admitting: Radiation Oncology

## 2019-10-07 ENCOUNTER — Other Ambulatory Visit: Payer: Self-pay

## 2019-10-07 VITALS — BP 136/62 | HR 72 | Temp 99.1°F | Resp 16

## 2019-10-07 DIAGNOSIS — C321 Malignant neoplasm of supraglottis: Secondary | ICD-10-CM

## 2019-10-07 MED ORDER — SODIUM CHLORIDE 0.9 % IV SOLN
Freq: Once | INTRAVENOUS | Status: AC
  Filled 2019-10-07: qty 250

## 2019-10-07 NOTE — Patient Instructions (Signed)

## 2019-10-08 ENCOUNTER — Ambulatory Visit
Admission: RE | Admit: 2019-10-08 | Discharge: 2019-10-08 | Disposition: A | Discharge status: Home / Self Care | Payer: Medicare Other | Source: Ambulatory Visit | Attending: Radiation Oncology | Admitting: Radiation Oncology

## 2019-10-08 ENCOUNTER — Encounter: Payer: Self-pay | Admitting: Radiation Oncology

## 2019-10-08 ENCOUNTER — Other Ambulatory Visit: Payer: Self-pay

## 2019-10-08 DIAGNOSIS — C321 Malignant neoplasm of supraglottis: Secondary | ICD-10-CM | POA: Diagnosis not present

## 2019-10-08 NOTE — Progress Notes (Signed)
Oncology Nurse Navigator Documentation  Met with Julian Brown and his wife [name] after final RT to offer support and to celebrate end of radiation treatment.   Provided verbal/written post-RT guidance:  Importance of keeping all follow-up appts, especially those with Nutrition and SLP.  Importance of protecting treatment area from sun.  Continuation of Sonafine application 2-3 times daily, application of antibiotic ointment to areas of raw skin; when supply of Sonafine exhausted transition to OTC lotion with vitamin E. Provided/reviewed Epic calendar of upcoming appts. Explained my role as navigator will continue for several more months, encouraged him to call me with needs/concerns.    [name] RN, BSN, OCN Head & Neck Oncology Nurse [place] at [facility] Phone # [phone]  Fax # [phone]

## 2019-10-09 ENCOUNTER — Inpatient Hospital Stay: Payer: Medicare Other

## 2019-10-09 ENCOUNTER — Other Ambulatory Visit: Payer: Self-pay

## 2019-10-09 VITALS — BP 126/92 | HR 86 | Temp 98.7°F | Resp 18 | Ht 71.0 in | Wt 199.4 lb

## 2019-10-09 DIAGNOSIS — C321 Malignant neoplasm of supraglottis: Secondary | ICD-10-CM | POA: Diagnosis not present

## 2019-10-09 MED ORDER — SODIUM CHLORIDE 0.9 % IV SOLN
Freq: Once | INTRAVENOUS | Status: AC
  Filled 2019-10-09: qty 250

## 2019-10-09 NOTE — Patient Instructions (Signed)

## 2019-10-09 NOTE — Progress Notes (Signed)
Pt received 1L IVF NS, tolerated well.  Able to drink during infusion, declined to use restroom or any food.  Pt aware to f/u as needed, denied any questions/concerns at time of d/c.

## 2019-10-11 ENCOUNTER — Other Ambulatory Visit: Payer: Self-pay

## 2019-10-11 ENCOUNTER — Inpatient Hospital Stay: Payer: Medicare Other

## 2019-10-11 VITALS — BP 124/54 | HR 63 | Temp 98.7°F | Resp 16

## 2019-10-11 DIAGNOSIS — C321 Malignant neoplasm of supraglottis: Secondary | ICD-10-CM | POA: Diagnosis not present

## 2019-10-11 MED ORDER — SODIUM CHLORIDE 0.9 % IV SOLN
Freq: Once | INTRAVENOUS | Status: AC
  Filled 2019-10-11: qty 250

## 2019-10-11 NOTE — Patient Instructions (Signed)

## 2019-10-15 NOTE — Progress Notes (Signed)
Oncology Nurse Navigator Documentation  Mrs. Julian Brown called me today. She reports that Mr. Julian Brown is resting most of the day. He has been able to eat very small amounts of food and is drinking 3-4 ensure plus daily. He continues to have thick saliva which causes him to spit out his food at times. I encouraged him to continue to drink ensure as tolerated, and advised him to keep using the baking soda rinses to help with the thick saliva.  Julian Brown is aware of his appointment at 11:00 with Garald Balding SLP on Thursday 10/17/19.  [name] RN, BSN, OCN Head & Neck Oncology Nurse [place] at [facility] Phone # [phone]  Fax # [phone]

## 2019-10-17 ENCOUNTER — Ambulatory Visit: Payer: Medicare Other | Attending: Radiation Oncology

## 2019-10-17 ENCOUNTER — Other Ambulatory Visit: Payer: Self-pay

## 2019-10-17 DIAGNOSIS — R1310 Dysphagia, unspecified: Secondary | ICD-10-CM | POA: Diagnosis not present

## 2019-10-17 NOTE — Progress Notes (Signed)
Oncology Nurse Navigator Documentation  I assisted Mr. Bowlby and his wife from registration to meet with Glendell Docker in the [facility]. I provided support regarding the registration process. They met with Glendell Docker as scheduled and were assisted to the lobby when the visit was completed. They know to call me if they have any further questions or concerns.   [name] RN, BSN, OCN Head & Neck Oncology Nurse [place] at [facility] Phone # [phone]  Fax # [phone]

## 2019-10-17 NOTE — Therapy (Signed)
[facility] [address] [place], , [address] Phone: [phone]   Fax:  [phone]  Speech Language Pathology Treatment  Patient Details  Name: Julian Brown MRN: [id number] Date of Birth: 03/08/1933 Referring Provider (SLP): Squire, Sarah, MD   Encounter Date: 10/17/2019  End of Session - 10/17/19 1215    Visit Number  3    Number of Visits  4    Date for SLP Re-Evaluation  11/13/19   90 days   SLP Start Time  1118    SLP Stop Time   1158    SLP Time Calculation (min)  40 min    Activity Tolerance  Patient tolerated treatment well       Past Medical History:  Diagnosis Date  . Acute gangrenous cholecystitis s/p lap cholecystectomy 06/08/2017 06/08/2017  . Arthritis   . Colon cancer (HCC)   . ED (erectile dysfunction)   . Emphysema of lung (HCC)   . Hyperlipemia   . Hypertension   . Prostate cancer (HCC)    prostate cancer-tx radiation  . Wears glasses     Past Surgical History:  Procedure Laterality Date  . APPENDECTOMY    . CARPAL TUNNEL RELEASE  5/12   lt  . CARPAL TUNNEL RELEASE  12/13/2011   Procedure: CARPAL TUNNEL RELEASE;  Surgeon: Robert V Sypher Jr., MD;  Location: [facility];  Service: Orthopedics;  Laterality: Right;  and inject right wrist  . CHOLECYSTECTOMY N/A 06/08/2017   Procedure: LAPAROSCOPIC CHOLECYSTECTOMY WITH LYSIS OF ADHESIONS;  Surgeon: Newman, David, MD;  Location: [facility];  Service: General;  Laterality: N/A;  . COLON SURGERY  1997   hemicolectomy-rt-ca  . COLONOSCOPY     about 12 inches of colon removed due to colon cancer    There were no vitals filed for this visit.  Subjective Assessment - 10/17/19 1127    Subjective  Pt remains aphonic. He ate stewed apples, mashed green beans and a few minute bites of pork loin last night.    Patient is accompained by:  Family member   wife   Currently in Pain?  No/denies            ADULT SLP  TREATMENT - 10/17/19 1132      General Information   Behavior/Cognition  Cooperative;Pleasant mood;Requires cueing;Hard of hearing      Treatment Provided   Treatment provided  Dysphagia      Dysphagia Treatment   Temperature Spikes Noted  No    Respiratory Status  Room air    Oral Cavity - Dentition  Missing dentition    Treatment Methods  Therapeutic exercise;Patient/caregiver education;Skilled observation    Patient observed directly with PO's  Yes    Type of PO's observed  Dysphagia 1 (puree);Thin liquids    Oral Phase Signs & Symptoms  --   none noted   Pharyngeal Phase Signs & Symptoms  Immediate cough   no cough 10/10 with smaller bites/sips   Other treatment/comments  Pt with coughing 60% of the time with larger (heaping teapoons) bites, and larger sips. SLP cued pt for smaller bites and smaller sips and coughing decr'd to 0% with 11/11 boluses. Told pt/wife to have pt take smaller bites and smaller sips at this time. Pt req'd rare min A for HEP. Wife reports pt has been keeping up with exercises - almost every day. SLP reminded pt/wife of BID completion, every day. SLP cued pt for when he needed to   pare down frequency. Wife stated some meds difficult to swallow so pt has not been taking meds - SLP told wife to call MD to verify this practice.       Assessment / Recommendations / Plan   Plan  Continue with current plan of care      Dysphagia Recommendations   Diet recommendations  --   as tolerated   Liquids provided via  Cup    Medication Administration  --   as tolerated     Progression Toward Goals   Progression toward goals  Progressing toward goals       SLP Education - 10/17/19 1229    Education Details  rationale for HEP, late effects radiation on swallowing, call MD about meds, take vegetable or coconut oil prior to meds to aid in pharymgeal transfer    Person(s) Educated  Patient;Spouse    Methods  Explanation;Demonstration;Verbal cues    Comprehension   Verbalized understanding;Returned demonstration;Verbal cues required;Need further instruction       SLP Short Term Goals - 10/17/19 1222      SLP SHORT TERM GOAL #1   Title  pt will perform HEP with occasional min A    Status  Partially Met      SLP SHORT TERM GOAL #2   Title  pt will tell SLP rationale for HEP completion with modified independence    Period  --   sessions   Status  Partially Met      SLP SHORT TERM GOAL #3   Title  pt and/or wife witll tell SLP 3 overt s/s aspiration PNA    Period  --   sessions   Status  Partially Met       SLP Long Term Goals - 10/17/19 1223      SLP LONG TERM GOAL #1   Title  pt and/or wife will state how a food journal can foster hastened return to most normalized diet    Time  1    Period  --   sessions   Status  On-going      SLP LONG TERM GOAL #2   Title  pt will complete HEP for swallowing with rare min A over two sessions    Time  3    Period  --   sessions   Status  On-going      SLP LONG TERM GOAL #3   Title  pt will tell SLP when he can decr frequency of HEP after radiation,with modified independence, over two sessions    Time  4    Period  --   sessions   Status  On-going       Plan - 10/17/19 1216    Clinical Impression Statement  Pt presents today with WNL/WFL swallowing ability with appleasuce and water, using small bites (level teaspoon) and sips (11/11 boluses). Overt s/sx aspiration see (immediate cough) seen with larger sips and larger bites, 60% of the time. No overt s/sx aspiraiton PNA reported or observed today. See "other comments" for more details. Data suggests that as pts progress through rad therapy that their swallowing ability will decrease. Also, WNL swallowing is threatened by muscle fibrosis that will likely develop after rad is completed. Therefore, SLP developed an indivdualized exercise program to mitigate/eliminate muscle fibrosis following rad tx. Skilled ST would be beneficial to the pt in  order to regularly assess pt's safety with POs and/or need for instrumental swallow assessment, as well as to assess proper completion of  HEP.    Speech Therapy Frequency  --   once approx every four weeks   Duration  --   3 visits   Treatment/Interventions  Aspiration precaution training;Pharyngeal strengthening exercises;Diet toleration management by SLP;Trials of upgraded texture/liquids;Patient/family education;Internal/external aids;SLP instruction and feedback;Compensatory techniques    Potential to Achieve Goals  Good    Potential Considerations  Ability to learn/carryover information   pt observed slower processing, decr'd memory (looked to wife for questions re: medical history, pt could not recall >2 details from yesterday's visit to cancer center)      Patient will benefit from skilled therapeutic intervention in order to improve the following deficits and impairments:   Dysphagia, unspecified type    Problem List Patient Active Problem List   Diagnosis Date Noted  . Malignant neoplasm of supraglottis (HCC) 08/08/2019  . Hoarseness of voice 06/12/2019  . Cough 05/23/2019  . Primary osteoarthritis of right wrist 07/31/2018  . Stage 2 moderate COPD by GOLD classification (HCC) 05/16/2018  . Allergic rhinitis 05/16/2018  . Nocturnal hypoxemia 05/16/2018  . Abnormal finding on lung imaging 05/16/2018  . Hypertension   . Hyperlipemia   . Acute gangrenous cholecystitis s/p lap cholecystectomy 06/08/2017 06/08/2017  . Pain in right wrist 01/04/2017  . Chronic pain of left knee 01/04/2017  . Chronic pain of right knee 01/04/2017  . Unilateral primary osteoarthritis, left knee 01/04/2017  . Unilateral primary osteoarthritis, right knee 01/04/2017    SCHINKE,CARL ,MS, CCC-SLP  10/17/2019, 12:30 PM  [facility] [address] [place], [place], [address] Phone: [phone]   Fax:  [phone]   Name: Broc F  Mcmeekin MRN: [id number] Date of Birth: 04/15/1933 

## 2019-10-18 ENCOUNTER — Ambulatory Visit (INDEPENDENT_AMBULATORY_CARE_PROVIDER_SITE_OTHER): Payer: Medicare Other | Admitting: Pulmonary Disease

## 2019-10-18 ENCOUNTER — Encounter: Payer: Self-pay | Admitting: Pulmonary Disease

## 2019-10-18 VITALS — BP 124/84 | HR 77 | Temp 97.4°F | Ht 71.0 in | Wt 190.0 lb

## 2019-10-18 DIAGNOSIS — C321 Malignant neoplasm of supraglottis: Secondary | ICD-10-CM

## 2019-10-18 DIAGNOSIS — J449 Chronic obstructive pulmonary disease, unspecified: Secondary | ICD-10-CM

## 2019-10-18 DIAGNOSIS — G4734 Idiopathic sleep related nonobstructive alveolar hypoventilation: Secondary | ICD-10-CM | POA: Diagnosis not present

## 2019-10-18 NOTE — Assessment & Plan Note (Signed)
Managed by [facility] Status post partial mass removal as well as radiation treatment Per patient he is to follow-up as needed at this point in time  Plan: Follow-up with [facility] ENT/oncology as needed

## 2019-10-18 NOTE — Patient Instructions (Addendum)
You were seen today by Lauraine Rinne, NP  for:   1. Stage 2 moderate COPD by GOLD classification (HCC)  Breo Ellipta 100 >>> Take 1 puff daily in the morning right when you wake up >>>Rinse your mouth out after use >>>This is a daily maintenance inhaler, NOT a rescue inhaler >>>Contact our office if you are having difficulties affording or obtaining this medication >>>It is important for you to be able to take this daily and not miss any doses   Spiriva Respimat 1.25 >>> 2 puffs daily >>> Do this every day >>>This is not a rescue inhaler  Note your daily symptoms > remember "red flags" for COPD:   >>>Increase in cough >>>increase in sputum production >>>increase in shortness of breath or activity  intolerance.   If you notice these symptoms, please call the office to be seen.    2. Nocturnal hypoxemia  Continue oxygen therapy at night  I do not believe that you need to purchase a portable oxygen concentrator based off your walk today  3. Hoarseness of voice 4. Malignant neoplasm of supraglottis ([place])  I am glad that you finish your treatments  Continue to follow-up with [facility] as instructed   Follow Up:    Return in about 4 months (around 02/18/2020), or if symptoms worsen or fail to improve, for Follow up with Dr. Purnell Shoemaker.   Please do your part to reduce the spread of COVID-19:      Reduce your risk of any infection  and COVID19 by using the similar precautions used for avoiding the common cold or flu:  Marland Kitchen Wash your hands often with soap and warm water for at least 20 seconds.  If soap and water are not readily available, use an alcohol-based hand sanitizer with at least 60% alcohol.  . If coughing or sneezing, cover your mouth and nose by coughing or sneezing into the elbow areas of your shirt or coat, into a tissue or into your sleeve (not your hands). Langley Gauss A MASK when in public  . Avoid shaking hands with others and consider head nods or verbal greetings  only. . Avoid touching your eyes, nose, or mouth with unwashed hands.  . Avoid close contact with people who are sick. . Avoid places or events with large numbers of people in one location, like concerts or sporting events. . If you have some symptoms but not all symptoms, continue to monitor at home and seek medical attention if your symptoms worsen. . If you are having a medical emergency, call 911.   [place] / e-Visit: [email or web address]         [facility]: [facility]: [id number]                   [facility]: [id number]     It is flu season:   >>> Best ways to protect herself from the flu: Receive the yearly flu vaccine, practice good hand hygiene washing with soap and also using hand sanitizer when available, eat a nutritious meals, get adequate rest, hydrate appropriately   Please contact the office if your symptoms worsen or you have concerns that you are not improving.   Thank you for choosing [facility] for your healthcare, and for allowing [place] to partner with you on your healthcare journey. I am thankful to be able to provide care to you today.   Aaron Edelman  [facility] FNP-C

## 2019-10-18 NOTE — Assessment & Plan Note (Signed)
Walk today in office, no oxygen desaturations  Plan: Continue oxygen therapy at night Patient does not qualify for portable oxygen concentrator at this time nor does he clinically need this based off of walk in office

## 2019-10-18 NOTE — Assessment & Plan Note (Signed)
Stable breathing  Plan: Continue Breo Ellipta 100 Continue Spiriva Respimat 1.25

## 2019-10-18 NOTE — Progress Notes (Signed)
@Patient  ID: Julian Brown, male    DOB: 15-Oct-1932, 86 y.o.   MRN: [id number]  Chief Complaint  Patient presents with  . Follow-up    5 month f/u for COPD.     Referring provider: Prince Solian, MD  HPI:  86 year old male former smoker followed in our office for COPD  Past medical history: Hypertension, hyperlipidemia Smoking history: Former smoker.  Quit 1988.  36-pack-year smoking history. Maintenance: Breo Ellipta 100, Spiriva Respimat 1.25 Patient of Dr. Chase Caller  10/18/2019  - Visit   86 year old male former smoker followed in our office for COPD.  Patient also had previously had around 2 years of hoarseness.  He was evaluated by ear nose and throat and found to have vocal cord paralysis.  He was referred to [facility] where a CT of his neck was performed that showed a malignant neoplasm of supraglottis.  He is status post SBRT as well as partial mass removal.  This is managed by [facility].  Overall patient tolerated the procedure well.  He is fatigued.  Breathing today is at baseline.  He is maintained on Spiriva Respimat 1.25 and Breo Ellipta 100.  He was trialed on Trelegy Ellipta at last office visit.  Patient did not see much clinical benefit and he prefers the Brio Ellipta and the Spiriva Respimat.  Patient continues to use nocturnal oxygen.  He is wondering if he needs any portable oxygen concentrator.  Walk today in office patient completed 1 lap without any oxygen desaturations on room air.  Questionaires / Pulmonary Flowsheets:   MMRC: mMRC Dyspnea Scale mMRC Score  10/18/2019 2     Tests:   04/23/2019-RAST panel-no elevations, IgE 15 04/23/2019-CBC with differential-eosinophils relative 0.6, eosinophils absolute 0.1 04/23/2019-alpha-1-148, phenotype PI*MM  04/16/2019-CT chest without contrast-stable tiny left upper lobe pulmonary nodules interval 1 year stability consistent with benign etiology, centrilobular emphysema, stable chronic atelectasis or  scarring right middle lobe, stable bilateral thyroid nodules, aortic arthrosclerosis  11/20/2017-pulmonary function test-FVC 2.23 (59% predicted), postbronchodilator ratio 61, postbronchodilator FEV1 1.43 (54% predicted), no bronchodilator response, DLCO 12.99 (41% predicted)  06/05/2019-CT soft tissue neck with contrast-asymmetric soft tissue posteriorly on the left at the level of the true vocal cords with an anterior displacement of the left arytenoid cartilage, this is concerning for neoplasm, recommend direct visualization   FENO:  No results found for: NITRICOXIDE  PFT: PFT Results Latest Ref Rng & Units 11/20/2017  FVC-Pre L 2.23  FVC-Predicted Pre % 59  FVC-Post L 2.36  FVC-Predicted Post % 62  Pre FEV1/FVC % % 61  Post FEV1/FCV % % 61  FEV1-Pre L 1.35  FEV1-Predicted Pre % 51  FEV1-Post L 1.43  DLCO UNC% % 41  DLCO COR %Predicted % 70  TLC L 5.79  TLC % Predicted % 84  RV % Predicted % 125    WALK:  SIX MIN WALK 10/18/2019 04/23/2019 05/16/2018 11/20/2017 10/25/2017 10/25/2017  Supplimental Oxygen during Test? (L/min) No No No No Yes No  O2 Flow Rate - - - - 2 -  Type - - - - Continuous -  Tech Comments: Patient was able to walk at steady pace for walk. Denies any SOB and CP. No O2 needed at time of call. Pt placed on 2L O2 and sats came up to 93 on 2L mid lap 1 he O2 sats dropped to 87-88 and heart rate dropped to 55 O2 applied at 2L to finish last 2 laps Pt walked all 3 required laps and  pt's O2 did drop below 90% which does qualify him for daytime O2. Pt denied any complaints. Pt required 2L O2 with exertion and maintained stable sats. Pt was stopped and placed on O2    Imaging: No results found.  Lab Results:  CBC    Component Value Date/Time   WBC 7.8 08/13/2019 0932   RBC 4.70 08/13/2019 0932   HGB 15.7 08/13/2019 0932   HCT 45.8 08/13/2019 0932   PLT 228 08/13/2019 0932   MCV 97.4 08/13/2019 0932   MCH 33.4 08/13/2019 0932   MCHC 34.3 08/13/2019 0932   RDW  12.2 08/13/2019 0932   LYMPHSABS 1.6 08/13/2019 0932   MONOABS 0.8 08/13/2019 0932   EOSABS 0.0 08/13/2019 0932   BASOSABS 0.1 08/13/2019 0932    BMET    Component Value Date/Time   NA 141 10/04/2019 1047   K 3.9 10/04/2019 1047   CL 101 10/04/2019 1047   CO2 28 10/04/2019 1047   GLUCOSE 135 (H) 10/04/2019 1047   BUN 20 10/04/2019 1047   CREATININE 0.95 10/04/2019 1047   CALCIUM 10.2 10/04/2019 1047   GFRNONAA >60 10/04/2019 1047   GFRAA >60 10/04/2019 1047    BNP No results found for: BNP  ProBNP No results found for: PROBNP  Specialty Problems      Pulmonary Problems   Allergic rhinitis   Nocturnal hypoxemia    05/01/2018- overnight oximetry on room air- pulse oximetry less than 88% for 5-1/2 hours >>>Start 2 L nasal cannula at night      Stage 2 moderate COPD by GOLD classification ([place])    11/20/2017-pulmonary function test FVC 2.23 (59% predicted), postbronchodilator ratio 61, FEV1 54, no significant bronchodilator response, DLCO 41 which corrects to 70 with lung volumes Severe obstructive airways disease, probable restriction, severe diffusion defect      Cough   Malignant neoplasm of supraglottis (HCC)      Allergies  Allergen Reactions  . Penicillins Swelling    Has patient had a PCN reaction causing immediate rash, facial/tongue/throat swelling, SOB or lightheadedness with hypotension: no (lip swelling "as soon as the pill touched my mouth so I didn't swallow it") Has patient had a PCN reaction causing severe rash involving mucus membranes or skin necrosis: no Has patient had a PCN reaction that required hospitalization: no Has patient had a PCN reaction occurring within the last 10 years: no If all of the above answers are "NO", then may proceed with Ceph    Immunization History  Administered Date(s) Administered  . Fluad Quad(high Dose 65+) 05/14/2019  . Influenza, High Dose Seasonal PF 02/11/2017, 04/18/2018  . PFIZER SARS-COV-2 Vaccination  08/20/2019, 09/10/2019  . Pneumococcal Conjugate-13 11/06/2014    Past Medical History:  Diagnosis Date  . Acute gangrenous cholecystitis s/p lap cholecystectomy 06/08/2017 06/08/2017  . Arthritis   . Colon cancer ([place])   . ED (erectile dysfunction)   . Emphysema of lung ([place])   . Hyperlipemia   . Hypertension   . Prostate cancer [facility])    prostate cancer-tx radiation  . Wears glasses     Tobacco History: Social History   Tobacco Use  Smoking Status Former Smoker  . Packs/day: 1.00  . Years: 36.00  . Pack years: 36.00  . Types: Cigarettes  . Quit date: 12/09/1986  . Years since quitting: 32.8  Smokeless Tobacco Never Used   Counseling given: Yes   Continue to not smoke  Outpatient Encounter Medications as of 10/18/2019  Medication Sig  . amLODipine (  NORVASC) 10 MG tablet Take 10 mg by mouth daily.  Marland Kitchen aspirin EC 81 MG tablet Take 81 mg by mouth daily.  Marland Kitchen BREO ELLIPTA 100-25 MCG/INH AEPB INHALE 1 PUFF INTO THE LUNGS ONCE DAILY.  . cholecalciferol (VITAMIN D) 1000 UNITS tablet Take 2,000 Units by mouth daily.   Marland Kitchen docusate sodium (COLACE) 100 MG capsule Take 2 capsules (200 mg total) by mouth daily. Take while on Hycet to prevent constipation.  . fluticasone (FLONASE) 50 MCG/ACT nasal spray   . gabapentin (NEURONTIN) 300 MG capsule Take 1 capsule (300 mg total) by mouth 3 (three) times daily.  Marland Kitchen HYDROcodone-acetaminophen (HYCET) 7.5-325 mg/15 ml solution Take 15 mLs by mouth 3 (three) times daily as needed for moderate pain. Take with food.  . lidocaine (XYLOCAINE) 2 % solution Patient: Mix 1part 2% viscous lidocaine, 1part H20. Swish & swallow 10mL of diluted mixture, 52min before meals and at bedtime, up to QID  . Multiple Vitamins-Minerals (PRESERVISION AREDS 2) CAPS Take by mouth.  . olmesartan-hydrochlorothiazide (BENICAR HCT) 40-12.5 MG tablet Take 1 tablet by mouth daily. [facility] order from [facility]  . rosuvastatin (CRESTOR) 10 MG tablet Take 10 mg by mouth daily.  .  sodium fluoride (PREVIDENT 5000 PLUS) 1.1 % CREA dental cream Apply to tooth brush. Brush teeth for 2 minutes. Spit out excess-DO NOT swallow. Repeat nightly.  Marland Kitchen SPIRIVA RESPIMAT 1.25 MCG/ACT AERS USE 2 PUFFS EACH DAY  . [DISCONTINUED] sucralfate (CARAFATE) 1 g tablet Dissolve 1 tablet in 10 mL H20 and swallow 30 min prior to meals and bedtime. (Patient not taking: Reported on 10/04/2019)   No facility-administered encounter medications on file as of 10/18/2019.     Review of Systems  Review of Systems  Constitutional: Positive for fatigue. Negative for activity change, chills, fever and unexpected weight change.  HENT: Positive for voice change. Negative for postnasal drip, rhinorrhea, sinus pressure, sinus pain and sore throat.   Eyes: Negative.   Respiratory: Positive for shortness of breath. Negative for cough and wheezing.   Cardiovascular: Negative for chest pain and palpitations.  Gastrointestinal: Negative for constipation, diarrhea, nausea and vomiting.  Endocrine: Negative.   Genitourinary: Negative.   Musculoskeletal: Negative.   Skin: Negative.   Neurological: Negative for dizziness and headaches.  Psychiatric/Behavioral: Negative.  Negative for dysphoric mood. The patient is not nervous/anxious.   All other systems reviewed and are negative.    Physical Exam  BP 124/84 (BP Location: Left Arm, Patient Position: Sitting, Cuff Size: Normal)   Pulse 77   Temp (!) 97.4 F (36.3 C) (Temporal)   Ht 5\' 11"  (1.803 m)   Wt 190 lb (86.2 kg)   SpO2 96% Comment: on RA  BMI 26.50 kg/m   Wt Readings from Last 5 Encounters:  10/18/19 190 lb (86.2 kg)  10/09/19 199 lb 6.4 oz (90.4 kg)  08/13/19 213 lb 9.6 oz (96.9 kg)  06/12/19 207 lb (93.9 kg)  04/23/19 220 lb 12.8 oz (100.2 kg)    BMI Readings from Last 5 Encounters:  10/18/19 26.50 kg/m  10/09/19 27.81 kg/m  08/13/19 29.79 kg/m  06/12/19 28.87 kg/m  04/23/19 30.80 kg/m     Physical Exam Vitals and nursing note  reviewed.  Constitutional:      General: He is not in acute distress.    Appearance: Normal appearance. He is normal weight.  HENT:     Head: Normocephalic and atraumatic.      Right Ear: Hearing and external ear normal.  Left Ear: Hearing and external ear normal.     Nose: Nose normal. No mucosal edema or rhinorrhea.     Right Turbinates: Not enlarged.     Left Turbinates: Not enlarged.     Mouth/Throat:     Mouth: Mucous membranes are dry.     Pharynx: Oropharynx is clear. No oropharyngeal exudate.     Comments: Hoarseness Eyes:     Pupils: Pupils are equal, round, and reactive to light.  Cardiovascular:     Rate and Rhythm: Normal rate and regular rhythm.     Pulses: Normal pulses.     Heart sounds: Normal heart sounds. No murmur.  Pulmonary:     Effort: Pulmonary effort is normal.     Breath sounds: No decreased breath sounds, wheezing or rales.  Musculoskeletal:     Cervical back: Normal range of motion.     Right lower leg: No edema.     Left lower leg: No edema.  Lymphadenopathy:     Cervical: No cervical adenopathy.  Skin:    General: Skin is warm and dry.     Capillary Refill: Capillary refill takes less than 2 seconds.     Findings: No erythema or rash.  Neurological:     General: No focal deficit present.     Mental Status: He is alert and oriented to person, place, and time.     Motor: No weakness.     Coordination: Coordination normal.     Gait: Gait is intact. Gait normal.  Psychiatric:        Mood and Affect: Mood normal.        Behavior: Behavior normal. Behavior is cooperative.        Thought Content: Thought content normal.        Judgment: Judgment normal.       Assessment & Plan:   Stage 2 moderate COPD by GOLD classification (HCC) Stable breathing  Plan: Continue Breo Ellipta 100 Continue Spiriva Respimat 1.25   Nocturnal hypoxemia Walk today in office, no oxygen desaturations  Plan: Continue oxygen therapy at night Patient  does not qualify for portable oxygen concentrator at this time nor does he clinically need this based off of walk in office  Malignant neoplasm of supraglottis ([place]) Managed by [facility] Status post partial mass removal as well as radiation treatment Per patient he is to follow-up as needed at this point in time  Plan: Follow-up with [facility] ENT/oncology as needed    Return in about 4 months (around 02/18/2020), or if symptoms worsen or fail to improve, for Follow up with Dr. Purnell Shoemaker.   Lauraine Rinne, NP 10/18/2019   This appointment required 32 minutes of patient care (this includes precharting, chart review, review of results, face-to-face care, etc.).

## 2019-10-21 ENCOUNTER — Telehealth: Payer: Self-pay | Admitting: *Deleted

## 2019-10-21 NOTE — Progress Notes (Signed)
Julian Brown presents today for 2 week follow up after completing radiation to the larynx on 10/08/2019  Pain issues, if any: Occasional sore/burning sensation in his throat Using a feeding tube?: N/A Weight changes, if any:  Wt Readings from Last 3 Encounters:  10/22/19 191 lb 4 oz (86.8 kg)  10/18/19 190 lb (86.2 kg)  10/09/19 199 lb 6.4 oz (90.4 kg)   Swallowing issues, if any:  Saw Julian Brown on 10/17/2019: " Pt presents today with WNL/WFL swallowing ability with appleasuce and water, using small bites (level teaspoon) and sips (11/11 boluses). Overt s/sx aspiration see (immediate cough) seen with larger sips and larger bites, 60% of the time. No overt s/sx aspiraiton PNA reported or observed today. Skilled ST would be beneficial to the pt in order to regularly assess pt's safety with POs and/or need for instrumental swallow assessment, as well as to assess proper completion of HEP. Aspiration precaution training;Pharyngeal strengthening exercises;Diet toleration management by SLP;Trials of upgraded texture/liquids;Patient/family education;Internal/external aids;SLP instruction and feedback;Compensatory techniques "  Smoking or chewing tobacco? No Using fluoride trays daily? N/A Last ENT visit was on: 07/16/2019 Julian Brown Other notable issues, if any:  Scheduled to meet with Julian Brown on 10/23/2019. Patient's voice is still hoarse and his mouth stays consistently dry. Patient's wife states that patient really is not interested in eating, and when he tries to eat he gets disinterested after one bite.  Vitals:   10/22/19 1358  BP: (!) 152/69  Pulse: 82  Resp: 20  Temp: 98.7 F (37.1 C)  SpO2: 93%

## 2019-10-21 NOTE — Telephone Encounter (Signed)
CALLED PATIENT TO ASK ABOUT COMING IN FOR FU ON 10-22-19 @ 2 PM, PATIENT AGREED TO DO SO

## 2019-10-22 ENCOUNTER — Encounter: Payer: Self-pay | Admitting: Radiation Oncology

## 2019-10-22 ENCOUNTER — Ambulatory Visit
Admission: RE | Admit: 2019-10-22 | Discharge: 2019-10-22 | Disposition: A | Discharge status: Home / Self Care | Payer: Medicare Other | Source: Ambulatory Visit | Attending: Radiation Oncology | Admitting: Radiation Oncology

## 2019-10-22 ENCOUNTER — Other Ambulatory Visit: Payer: Self-pay

## 2019-10-22 VITALS — BP 152/69 | HR 82 | Temp 98.7°F | Resp 20 | Ht 71.0 in | Wt 191.2 lb

## 2019-10-22 DIAGNOSIS — R634 Abnormal weight loss: Secondary | ICD-10-CM | POA: Diagnosis not present

## 2019-10-22 DIAGNOSIS — C321 Malignant neoplasm of supraglottis: Secondary | ICD-10-CM | POA: Insufficient documentation

## 2019-10-22 DIAGNOSIS — R630 Anorexia: Secondary | ICD-10-CM | POA: Diagnosis not present

## 2019-10-22 DIAGNOSIS — R1310 Dysphagia, unspecified: Secondary | ICD-10-CM | POA: Insufficient documentation

## 2019-10-22 DIAGNOSIS — Z7982 Long term (current) use of aspirin: Secondary | ICD-10-CM | POA: Insufficient documentation

## 2019-10-22 DIAGNOSIS — Z79899 Other long term (current) drug therapy: Secondary | ICD-10-CM | POA: Diagnosis not present

## 2019-10-22 NOTE — Progress Notes (Signed)
Radiation Oncology         [phone]) [phone] ________________________________  Name: Julian Brown MRN: [id number]  Date: 10/22/2019  DOB: Jan 21, 1933  Follow-Up Visit Note  CC: Julian Solian, MD  Julian Ames, MD  Diagnosis and Prior Radiotherapy:       ICD-10-CM   1. Malignant neoplasm of supraglottis ([place])  C32.1     CHIEF COMPLAINT:  Here for follow-up and surveillance of throat cancer  Narrative:  The patient returns today for routine follow-up.   Julian Brown presents today for 2 week follow up after completing radiation to the larynx on 10/08/2019  He was seen prematurely today as he was having issues with weight loss and poor p.o. intake.  Pain issues, if any: Occasional sore/burning sensation in his throat Using a feeding tube?: N/A Weight changes, if any:  Wt Readings from Last 3 Encounters:  10/22/19 191 lb 4 oz (86.8 kg)  10/18/19 190 lb (86.2 kg)  10/09/19 199 lb 6.4 oz (90.4 kg)   Swallowing issues, if any:  Saw Carl Schinke-SLP on 10/17/2019. It was noted that the patient had dysphagia but swallowed better when he took small sips.  Smoking or chewing tobacco? No Using fluoride trays daily? N/A Last ENT visit was on: 07/16/2019 Dr Nicolette Bang  Other notable issues, if any:  Scheduled to meet with Barb Neff-RD on 10/23/2019.   Patient's voice is still hoarse and his mouth stays consistently dry. Patient's wife states that patient really is not interested in eating, and when he tries to eat he gets disinterested after one bite. She believes he is drinking 2-3 Ensure per day. He is drinking about 2 cups of sweetened iced tea that is decaffeinated and 1 to 2 cups of water. He is not eating a lot of solid food at all. Small bites here and there.  Seated vitals: Vitals:   10/22/19 1358  BP: (!) 152/69  Pulse: 82  Resp: 20  Temp: 98.7 F (37.1 C)  SpO2: 93%   Upon standing, his blood pressure is 129/67 and pulse is 76                      ALLERGIES:  is  allergic to penicillins.  Meds: Current Outpatient Medications  Medication Sig Dispense Refill  . amLODipine (NORVASC) 10 MG tablet Take 10 mg by mouth daily.    Marland Kitchen aspirin EC 81 MG tablet Take 81 mg by mouth daily.    Marland Kitchen BREO ELLIPTA 100-25 MCG/INH AEPB INHALE 1 PUFF INTO THE LUNGS ONCE DAILY. 60 each 5  . cholecalciferol (VITAMIN D) 1000 UNITS tablet Take 2,000 Units by mouth daily.     Marland Kitchen docusate sodium (COLACE) 100 MG capsule Take 2 capsules (200 mg total) by mouth daily. Take while on Hycet to prevent constipation. 60 capsule 1  . fluticasone (FLONASE) 50 MCG/ACT nasal spray     . gabapentin (NEURONTIN) 300 MG capsule Take 1 capsule (300 mg total) by mouth 3 (three) times daily. 90 capsule 1  . HYDROcodone-acetaminophen (HYCET) 7.5-325 mg/15 ml solution Take 15 mLs by mouth 3 (three) times daily as needed for moderate pain. Take with food. 473 mL 0  . lidocaine (XYLOCAINE) 2 % solution Patient: Mix 1part 2% viscous lidocaine, 1part H20. Swish & swallow 45mL of diluted mixture, 49min before meals and at bedtime, up to QID 200 mL 3  . Multiple Vitamins-Minerals (PRESERVISION AREDS 2) CAPS Take by mouth.    . olmesartan-hydrochlorothiazide (BENICAR HCT) 40-12.5 MG  tablet Take 1 tablet by mouth daily. [facility]    . rosuvastatin (CRESTOR) 10 MG tablet Take 10 mg by mouth daily.    . sodium fluoride (PREVIDENT 5000 PLUS) 1.1 % CREA dental cream Apply to tooth brush. Brush teeth for 2 minutes. Spit out excess-DO NOT swallow. Repeat nightly. 1 Tube prn  . SPIRIVA RESPIMAT 1.25 MCG/ACT AERS USE 2 PUFFS EACH DAY 4 g 5   No current facility-administered medications for this encounter.    Physical Findings: The patient is in no acute distress. Patient is alert and oriented. Wt Readings from Last 3 Encounters:  10/22/19 191 lb 4 oz (86.8 kg)  10/18/19 190 lb (86.2 kg)  10/09/19 199 lb 6.4 oz (90.4 kg)    height is 5\' 11"  (1.803 m) and weight is 191 lb 4 oz (86.8 kg). His  temporal temperature is 98.7 F (37.1 C). His blood pressure is 152/69 (abnormal) and his pulse is 82. His respiration is 20 and oxygen saturation is 93%. .  General: Alert and oriented, in no acute distress I obtain chocolate ice cream and vanilla pudding for the patient at the cafeteria. His wife brought in Ensure. I instructed the patient to take small bites of the ice cream and pudding. He tolerated these well. I instructed him to take small sips of the Ensure. He tolerated this relatively well. Instructed him to take small sips of water. He tolerated this relatively well. He occasionally coughed while eating. Once he spit thick secretions into a towel.   Lab Findings: Lab Results  Component Value Date   WBC 7.8 08/13/2019   HGB 15.7 08/13/2019   HCT 45.8 08/13/2019   MCV 97.4 08/13/2019   PLT 228 08/13/2019    Lab Results  Component Value Date   TSH 0.494 08/13/2019    Radiographic Findings: No results found.  Impression/Plan:    1) Head and Neck Cancer Status: Healing from radiation therapy and struggling with oral intake  2) Nutritional Status: Moderate weight loss since completing radiation therapy PEG tube: None  As discussed above in the physical exam, he tolerated the ice cream and pudding that I obtained for him in the cafeteria as well as small sips of water and Ensure.  We had a very lengthy discussion on nutritional needs and strategies. I instructed him to take small sips of liquids and to strive to get 5 to 6 cups of clear liquids a day. I instructed the patient and his wife on the importance of ingesting as much Ensure as he can. We talked about other options for p.o. intake including puddings, ice creams, and high-protein high-calorie snacks or meals such as chicken salad, tuna salad, eggs, oatmeal with lots of butter and sugar, cream based soups with half-and-half, cottage cheese, yogurt ([place]) etc.   Encouragement given to the patient and his wife. He wants to  avoid a feeding tube if he can  Continue close follow-up with nutrition and swallowing therapy  I will see him back in 1 week  On date of service, in total, I spent 45 minutes on this encounter. Patient seen in person. _____________________________________   Eppie Gibson, MD

## 2019-10-23 ENCOUNTER — Other Ambulatory Visit: Payer: Self-pay

## 2019-10-23 ENCOUNTER — Inpatient Hospital Stay: Payer: Medicare Other | Attending: Radiation Oncology | Admitting: Nutrition

## 2019-10-23 NOTE — Progress Notes (Signed)
Nutrition follow-up completed with patient and his wife.   Patient completed radiation therapy for supraglottis cancer on April 27. He does not have a feeding tube. Patient last seen by nutrition on April 21.   Patient weighed 205.4 pounds on April 19 which was stable from 205.4 pounds on April 12.  This reflected a 4% weight loss over 7 weeks.   Patient weighed 191.25 pounds on May 11 which is a loss of 14 pounds over 3 weeks.  Patient unable to provide answers to why he is not eating.  He finds it difficult to tell if he likes certain flavors of nutrition products and is unable to provide information as to why he will not drink them or eat food. Wife reports patient's "brain does not work well." Wife states patient will take a few bites of food or drink a few sips of liquid and then stops. Denies nausea, vomiting, constipation, and diarrhea. Reports he might drink 3 Ensure Plus daily on a good day. He is using baking soda and salt water 1 time a day at bedtime.  Nutrition diagnosis: Inadequate oral intake continues.  Estimated nutrition needs: 2200-2400 cal, 110-120 g protein, 2.4 L 4.  Intervention: Educated patient to continue small frequent meals and snacks utilizing high-calorie liquids and soft foods as tolerated.  Encouraged him to take bites and sips as able. Recommended patient try to increase Ensure plus/Ensure Enlive.  He would need 6 bottles daily to meet estimated nutrition needs if he is unable to eat foods. Offered patient a variety of oral nutrition supplements.  Patient tolerated Boost Soothe (300 cal, 10 gm pro) and D[employer] [place] chocolate (330 cal, 16 gm pro) oral nutrition supplements.  Encouraged variety throughout the day. Noted patient did cough up some phlegm while drinking liquids. Encouraged patient to rinse his mouth out with baking soda and salt water throughout the day but especially before he is eating and drinking. Educated on the importance of adequate fluid  intake to thin down thick saliva. Provided multiple samples of oral nutrition supplements.  Provided fact sheets on soft moist foods.  Questions were answered.  Teach back method used.   Patient given contact information with nutrition follow-up appointments.  Monitoring, evaluation, goals: Patient will tolerate increased calories and protein to minimize weight loss and promote healing.  Next visit: Tuesday, May 18 after visit with Dr. Isidore Moos.

## 2019-10-25 ENCOUNTER — Ambulatory Visit: Payer: Self-pay | Admitting: Radiation Oncology

## 2019-10-25 NOTE — Progress Notes (Signed)
Oncology Nurse Navigator Documentation  Mrs. Kirchgessner left a voice mail with me earlier today. She was concerned about her husband. I called her back and she reports that he has had a low grade fever today of 99.3, 99.7 & 100.1. She also reports right shoulder pain and posterior neck pain when moving his head side to side. I advised that the pain and fever are most likely not related and to keep checking his fever over the weekend and if it became uncontrolled after taking Tylenol as directed that she may call the on-call physcian for Radiation or take him to the Emergency Room. She voiced appreciation for the call, and knows to call me back with any questions or concerns.   [name] RN, BSN, OCN Head & Neck Oncology Nurse [place] at [facility] Phone # [phone]  Fax # [phone]

## 2019-10-29 ENCOUNTER — Ambulatory Visit
Admission: RE | Admit: 2019-10-29 | Discharge: 2019-10-29 | Disposition: A | Discharge status: Home / Self Care | Payer: Medicare Other | Source: Ambulatory Visit | Attending: Radiation Oncology | Admitting: Radiation Oncology

## 2019-10-29 ENCOUNTER — Inpatient Hospital Stay: Payer: Medicare Other | Admitting: Nutrition

## 2019-10-29 ENCOUNTER — Other Ambulatory Visit: Payer: Self-pay

## 2019-10-29 ENCOUNTER — Telehealth: Payer: Self-pay

## 2019-10-29 ENCOUNTER — Encounter: Payer: Self-pay | Admitting: Radiation Oncology

## 2019-10-29 VITALS — BP 158/75 | HR 94 | Temp 97.5°F | Resp 20 | Ht 71.0 in | Wt 185.4 lb

## 2019-10-29 DIAGNOSIS — Z7982 Long term (current) use of aspirin: Secondary | ICD-10-CM | POA: Insufficient documentation

## 2019-10-29 DIAGNOSIS — R4189 Other symptoms and signs involving cognitive functions and awareness: Secondary | ICD-10-CM | POA: Diagnosis not present

## 2019-10-29 DIAGNOSIS — Z79899 Other long term (current) drug therapy: Secondary | ICD-10-CM | POA: Diagnosis not present

## 2019-10-29 DIAGNOSIS — R630 Anorexia: Secondary | ICD-10-CM | POA: Diagnosis not present

## 2019-10-29 DIAGNOSIS — C321 Malignant neoplasm of supraglottis: Secondary | ICD-10-CM | POA: Diagnosis not present

## 2019-10-29 DIAGNOSIS — Z923 Personal history of irradiation: Secondary | ICD-10-CM | POA: Insufficient documentation

## 2019-10-29 LAB — BASIC METABOLIC PANEL - CANCER CENTER ONLY
Anion gap: 12 (ref 5–15)
BUN: 26 mg/dL — ABNORMAL HIGH (ref 8–23)
CO2: 28 mmol/L (ref 22–32)
Calcium: 11.2 mg/dL — ABNORMAL HIGH (ref 8.9–10.3)
Chloride: 103 mmol/L (ref 98–111)
Creatinine: 1.05 mg/dL (ref 0.61–1.24)
GFR, Est AFR Am: >60 mL/min (ref >60)
GFR, Estimated: >60 mL/min (ref >60)
Glucose, Bld: 135 mg/dL — ABNORMAL HIGH (ref 70–99)
Potassium: 3.7 mmol/L (ref 3.5–5.1)
Sodium: 143 mmol/L (ref 135–145)

## 2019-10-29 NOTE — Progress Notes (Signed)
Oncology Nurse Navigator Documentation  I met with patient during his follow up with Dr. Isidore Moos today post radiation. He continues to have difficulty swallowing due to taste changes and thick phlegm. Dr. Isidore Moos has recommended a PEG tube be placed. I will call patient and wife with PEG appointment and scheduled PEG education beforehand.  I assisted him to lab today and his wife is aware to return downstairs for his nutrition appointment after labs have been drawn.  He and his wife were appreciative of the assistance today, and know to call me with any further questions or concerns.   [name] RN, BSN, OCN Head & Neck Oncology Nurse [place] at [facility] Phone # [phone]  Fax # [phone]

## 2019-10-29 NOTE — Progress Notes (Signed)
Radiation Oncology         [phone] ________________________________  Name: BREXTON JASKOLSKI MRN: [id number]  Date: 10/29/2019  DOB: 08-09-1932  Follow-Up Visit Note  CC: Prince Solian, MD  Francina Ames, MD  Diagnosis and Prior Radiotherapy:       ICD-10-CM   1. Malignant neoplasm of supraglottis ([place])  [id number] Basic Metabolic Panel - [place] Only    IR GASTROSTOMY TUBE MOD SED    Amb Referral to Neuro Oncology  2. Cognitive decline  R41.89 Amb Referral to Neuro Oncology    CHIEF COMPLAINT:  Here for follow-up and surveillance of throat cancer  Narrative:  The patient returns today for routine follow-up.     His p.o. intake has not improved.  His wife still notes cognitive decline.  He is napping a lot during the day.                      ALLERGIES:  is allergic to penicillins.  Meds: Current Outpatient Medications  Medication Sig Dispense Refill  . amLODipine (NORVASC) 10 MG tablet Take 10 mg by mouth daily.    Marland Kitchen aspirin EC 81 MG tablet Take 81 mg by mouth daily.    Marland Kitchen BREO ELLIPTA 100-25 MCG/INH AEPB INHALE 1 PUFF INTO THE LUNGS ONCE DAILY. 60 each 5  . cholecalciferol (VITAMIN D) 1000 UNITS tablet Take 2,000 Units by mouth daily.     Marland Kitchen docusate sodium (COLACE) 100 MG capsule Take 2 capsules (200 mg total) by mouth daily. Take while on Hycet to prevent constipation. 60 capsule 1  . fluticasone (FLONASE) 50 MCG/ACT nasal spray     . gabapentin (NEURONTIN) 300 MG capsule Take 1 capsule (300 mg total) by mouth 3 (three) times daily. 90 capsule 1  . HYDROcodone-acetaminophen (HYCET) 7.5-325 mg/15 ml solution Take 15 mLs by mouth 3 (three) times daily as needed for moderate pain. Take with food. 473 mL 0  . lidocaine (XYLOCAINE) 2 % solution Patient: Mix 1part 2% viscous lidocaine, 1part H20. Swish & swallow 10mL of diluted mixture, 50min before meals and at bedtime, up to QID 200 mL 3  . Multiple Vitamins-Minerals (PRESERVISION AREDS 2) CAPS Take by mouth.    .  olmesartan-hydrochlorothiazide (BENICAR HCT) 40-12.5 MG tablet Take 1 tablet by mouth daily. Please order from [facility]    . rosuvastatin (CRESTOR) 10 MG tablet Take 10 mg by mouth daily.    . sodium fluoride (PREVIDENT 5000 PLUS) 1.1 % CREA dental cream Apply to tooth brush. Brush teeth for 2 minutes. Spit out excess-DO NOT swallow. Repeat nightly. 1 Tube prn  . SPIRIVA RESPIMAT 1.25 MCG/ACT AERS USE 2 PUFFS EACH DAY 4 g 5   No current facility-administered medications for this encounter.    Physical Findings:   Wt Readings from Last 3 Encounters:  10/29/19 185 lb 6.4 oz (84.1 kg)  10/22/19 191 lb 4 oz (86.8 kg)  10/18/19 190 lb (86.2 kg)    height is 5\' 11"  (1.803 m) and weight is 185 lb 6.4 oz (84.1 kg). His temperature is 97.5 F (36.4 C) (abnormal). His blood pressure is 158/75 (abnormal) and his pulse is 94. His respiration is 20 and oxygen saturation is 96%. .  General: The patient is very quiet and does not provide much of a history.  He is nontoxic-appearing.  He is ambulatory.  He is in no acute distress  Lab Findings: Lab Results  Component Value Date   WBC 7.8 08/13/2019  HGB 15.7 08/13/2019   HCT 45.8 08/13/2019   MCV 97.4 08/13/2019   PLT 228 08/13/2019    Lab Results  Component Value Date   TSH 0.494 08/13/2019    Radiographic Findings: No results found.  Impression/Plan:     Mr. Rago does not seem to be improving with his nutritional and intake.  He is still consuming approximately 1000 cal a day, mostly through boost.  His wife states that he is staying hydrated with clear liquids.  I believe that his poor intake is due to lack of appetite, phlegm in his throat, and dysgeusia.  I believe that the symptoms are not going to improve for at least a couple months.  His wife has done everything in her power to escalate his p.o. intake but she cannot convince him to drink or eat more.  I recommend that he get scheduled for a PEG tube placement in about a  week.  If he substantially improves and his intake before then, we can cancel the PEG tube placement.  The patient and his wife are agreeable to this plan.  They understand the feeding tube is temporary and that he can still eat when he has it.   We will also obtain lab work today just to check on his kidney function and electrolytes in the setting of malnutrition.  He had cognitive decline prior to starting his oncologic therapy and this has worsened in recent weeks.  I will make a referral to neuro oncology for evaluation and treatment as indicated.  Mr. Amor is agreeable with this plan, as is his wife  On date of service, in total, I spent 45 minutes on this encounter. Patient seen in person. _____________________________________   Eppie Gibson, MD

## 2019-10-29 NOTE — Telephone Encounter (Signed)
Spoke with patient via phone he is seen some blood in toilet none since them. Is not eating is drinking Boost 4 per day.

## 2019-10-29 NOTE — Patient Instructions (Signed)
Coronavirus (COVID-19) Are you at risk?  Are you at risk for the Coronavirus (COVID-19)?  To be considered HIGH RISK for Coronavirus (COVID-19), you have to meet the following criteria:  . Traveled to [place], [place], [place], [place] or [place]; or in the [place] to [place], [place], [place], or [place]; and have fever, cough, and shortness of breath within the last 2 weeks of travel OR . Been in close contact with a person diagnosed with COVID-19 within the last 2 weeks and have fever, cough, and shortness of breath . IF YOU DO NOT MEET THESE CRITERIA, YOU ARE CONSIDERED LOW RISK FOR COVID-19.  What to do if you are HIGH RISK for COVID-19?  . If you are having a medical emergency, call 911. . Seek medical care right away. Before you go to a doctor's office, urgent care or emergency department, call ahead and tell them about your recent travel, contact with someone diagnosed with COVID-19, and your symptoms. You should receive instructions from your physician's office regarding next steps of care.  . When you arrive at healthcare provider, tell the healthcare staff immediately you have returned from visiting [place], [place], [place], [place] or [place]; or traveled in the [place] to [place], [place], [place], or [place]; in the last two weeks or you have been in close contact with a person diagnosed with COVID-19 in the last 2 weeks.   . Tell the health care staff about your symptoms: fever, cough and shortness of breath. . After you have been seen by a medical provider, you will be either: o Tested for (COVID-19) and discharged home on quarantine except to seek medical care if symptoms worsen, and asked to  - Stay home and avoid contact with others until you get your results (4-5 days)  - Avoid travel on public transportation if possible (such as bus, train, or airplane) or o Sent to the Emergency Department by EMS for evaluation, COVID-19 testing, and possible  admission depending on your condition and test results.  What to do if you are LOW RISK for COVID-19?  Reduce your risk of any infection by using the same precautions used for avoiding the common cold or flu:  . Wash your hands often with soap and warm water for at least 20 seconds.  If soap and water are not readily available, use an alcohol-based hand sanitizer with at least 60% alcohol.  . If coughing or sneezing, cover your mouth and nose by coughing or sneezing into the elbow areas of your shirt or coat, into a tissue or into your sleeve (not your hands). . Avoid shaking hands with others and consider head nods or verbal greetings only. . Avoid touching your eyes, nose, or mouth with unwashed hands.  . Avoid close contact with people who are sick. . Avoid places or events with large numbers of people in one location, like concerts or sporting events. . Carefully consider travel plans you have or are making. . If you are planning any travel outside or inside the [place], visit the CDC's Travelers' Health webpage for the latest health notices. . If you have some symptoms but not all symptoms, continue to monitor at home and seek medical attention if your symptoms worsen. . If you are having a medical emergency, call 911.   ADDITIONAL HEALTHCARE OPTIONS FOR PATIENTS  [facility] / e-Visit: [email or web address]         [facility]: [phone]  [facility]: [phone]                   [facility]: [phone]   

## 2019-10-29 NOTE — Progress Notes (Signed)
Nutrition Follow-up: Met with patient and his wife for nutrition follow-up this afternoon. Patient is s/p completion of radiation therapy on 10/08/19 for supraglottis cancer. Patient last seen by nutrition on 10/23/19 and weighed 191.25 lb. Today, pt weighed 185.02 lbs indicating -5.4 lb (2.8%) wt loss in the last 7 days which is significant and 20.38 lb (9.9%) in 1 month, noted 205.4 lbs on  10/02/19.  Per wife report patient continues with poor oral intake. She reports patient had 2 Boost and 1 Boost Smooth yesterday (~1000 kcal, 38 grams of protein) Patient states that everything is bland, or taste strange, finds supplements too thick. Per wife, Dr. Isidore Moos discussed placing feeding tube with patient during appointment today, pt and wife agreeable to plan.   Nutrition diagnosis: Inadequate oral intake continues  Estimated nutrition needs: 2200-2400 kcal, 110-120 grams protein, 2.4 L fluids  Intervention: Educated on continuing small frequent intake of nutrient dense foods, and supplements. Provided suggestions on increasing calorie/protein with supplements (milkshakes made with Boost and peanut butter powder) Carnation Instant Breakfast with whole milk, Bone Broths. Recommended trying plastic silverware when eating, using herbs, lemon juice, vinegar to season foods to enhance flavors. Encouraged continued use of oral rinse prior to eating/drinking. Questions were answered, teach back method used. Confirmed follow-up appointments.   Monitoring, Evaluation, Goals: Patient will tolerate increased calories and protein to minimize wt loss and promote healing.  Monitor for placement of feeding tube, provide further tube feeding education as appropriate.   Next visit: Telephone follow-up May 25th  Lajuan Lines, RD, LDN Clinical Nutrition After Hours/Weekend Pager # in [facility]

## 2019-10-29 NOTE — Progress Notes (Signed)
Patient in for follow up. Spoke to the wife earlier on the phone today. Patient does not feel  Like he is improving at this time. Denies pain. Has no energy or appetite.

## 2019-10-30 ENCOUNTER — Telehealth (HOSPITAL_COMMUNITY): Payer: Self-pay

## 2019-10-30 ENCOUNTER — Telehealth: Payer: Self-pay | Admitting: Internal Medicine

## 2019-10-30 ENCOUNTER — Other Ambulatory Visit: Payer: Self-pay

## 2019-10-30 ENCOUNTER — Ambulatory Visit: Payer: Medicare Other | Admitting: Pulmonary Disease

## 2019-10-30 ENCOUNTER — Encounter: Payer: Self-pay | Admitting: Internal Medicine

## 2019-10-30 DIAGNOSIS — C321 Malignant neoplasm of supraglottis: Secondary | ICD-10-CM

## 2019-10-30 NOTE — Telephone Encounter (Signed)
-----   Message from Corrie Mckusick, DO sent at 10/30/2019 10:40 AM EDT ----- Regarding: RE: Peg placement OK for image guided percutaneous gastrostomy placement.   Earleen Newport ----- Message ----- From: Danielle Dess Sent: 10/30/2019  10:03 AM EDT To: [facility] Procedure Requests Subject: Peg placement                                  Procedure: Peg placement  Dx: Malignant neoplasm of supraglottis  Ordering: Dr. Peyton Bottoms [phone]  Imaging: CT chest done 04/2019 & CT Abd done 05/2017  Please review  Thanks, Lia Foyer

## 2019-10-30 NOTE — Telephone Encounter (Signed)
Received a new pt referral from Dr. Isidore Moos for Julian Brown to see Dr. Mickeal Skinner in 2 months for cognitive decline. Julian Brown has been scheduled to see Dr. Mickeal Skinner on 7/20 at 11am. Letter mailed to the pt.

## 2019-10-30 NOTE — Progress Notes (Signed)
Oncology Nurse Navigator Documentation  I spoke with Julian Brown today. At Dr. Pearlie Oyster request I informed her that Julian Brown is slightly dehydrated and for her to push fluids as much as possible. Dr. Isidore Moos also ordered a PEG tube after seeing him yesterday. I informed Julian Brown that it is scheduled on 11/06/19 at [facility] with an arrival time of 7:00 am. He needs to be NPO after midnight and have a driver. She voiced her understanding. We discussed a time for them to come speak to me regarding PEG education and agreed on 10/31/19 at 1:00. They will have the front desk page me when they arrive. She knows to call me with any further questions or concerns.  [name] RN, BSN, OCN Head & Neck Oncology Nurse [place] at [facility] Phone # [phone]  Fax # [phone]

## 2019-10-31 ENCOUNTER — Other Ambulatory Visit: Payer: Self-pay

## 2019-10-31 DIAGNOSIS — E878 Other disorders of electrolyte and fluid balance, not elsewhere classified: Secondary | ICD-10-CM

## 2019-10-31 MED ORDER — THIAMINE HCL 100 MG PO TABS: 100.0000 mg | ORAL_TABLET | Freq: Every day | ORAL | 1 refills | Status: DC

## 2019-10-31 MED ORDER — MULTI-VITAMIN/MINERALS PO TABS
1.0000 | ORAL_TABLET | Freq: Every day | ORAL | 1 refills | Status: DC
Start: 2019-10-31 — End: 2020-05-27

## 2019-10-31 NOTE — Progress Notes (Signed)
Oncology Nurse Navigator Documentation  Met with       to provide PEG/port education prior to  11/06/19 placement.  Provided port educational handout, showed example, provided guidance for post-surgical dsg removal, site care.  . Using  PEG teaching device   and Teach Back, provided education for PEG use and care, including: hand hygiene, gravity bolus administration of daily water flushes and nutritional supplement, fluids and medications; care of tube insertion site including daily dressing change and cleaning; S&S of infection.   . Julian Brown and his wife Julian Brown correctly verbalized procedures for and provided correct return demonstration of gravity administration of water, dressing change and site care.  . I provided written instructions for PEG flushing/dressing change in support of verbal instruction.   . I provided/described contents of Start of Care Bolus Feeding Kit (3 60 cc syringes, 2 boxes 4x4 drainage sponges, 1 package mesh briefs, 1 roll paper tape, 1 case Osmolite 1.5).  He voiced understanding he is to start using Osmolite per guidance of Nutrition. Marland Kitchen He understands I will be available for ongoing PEG support. Provided barium sulfate prep which I obtained from [facility] IR, reviewed instructions which included guidance for Saturday 11/02/19 COVID screening at [facility].

## 2019-11-02 ENCOUNTER — Other Ambulatory Visit (HOSPITAL_COMMUNITY)
Admission: RE | Admit: 2019-11-02 | Discharge: 2019-11-02 | Disposition: A | Discharge status: Home / Self Care | Payer: Medicare Other | Source: Ambulatory Visit | Attending: Radiation Oncology | Admitting: Radiation Oncology

## 2019-11-02 DIAGNOSIS — Z20822 Contact with and (suspected) exposure to covid-19: Secondary | ICD-10-CM | POA: Diagnosis not present

## 2019-11-02 DIAGNOSIS — Z01812 Encounter for preprocedural laboratory examination: Secondary | ICD-10-CM | POA: Diagnosis present

## 2019-11-02 LAB — SARS CORONAVIRUS 2 (TAT 6-24 HRS): SARS Coronavirus 2: NEGATIVE

## 2019-11-05 ENCOUNTER — Other Ambulatory Visit: Payer: Self-pay

## 2019-11-05 ENCOUNTER — Ambulatory Visit
Admission: RE | Admit: 2019-11-05 | Discharge: 2019-11-05 | Disposition: A | Discharge status: Home / Self Care | Payer: Medicare Other | Source: Ambulatory Visit | Attending: Radiation Oncology | Admitting: Radiation Oncology

## 2019-11-05 ENCOUNTER — Inpatient Hospital Stay: Payer: Medicare Other | Admitting: Nutrition

## 2019-11-05 ENCOUNTER — Other Ambulatory Visit: Payer: Self-pay | Admitting: Radiology

## 2019-11-05 DIAGNOSIS — C321 Malignant neoplasm of supraglottis: Secondary | ICD-10-CM | POA: Diagnosis not present

## 2019-11-05 LAB — CMP (CANCER CENTER ONLY)
ALT: 34 U/L (ref 0–44)
AST: 23 U/L (ref 15–41)
Albumin: 3.1 g/dL — ABNORMAL LOW (ref 3.5–5.0)
Alkaline Phosphatase: 69 U/L (ref 38–126)
Anion gap: 5 (ref 5–15)
BUN: 16 mg/dL (ref 8–23)
CO2: 30 mmol/L (ref 22–32)
Calcium: 10.4 mg/dL — ABNORMAL HIGH (ref 8.9–10.3)
Chloride: 104 mmol/L (ref 98–111)
Creatinine: 1.01 mg/dL (ref 0.61–1.24)
GFR, Est AFR Am: >60 mL/min (ref >60)
GFR, Estimated: >60 mL/min (ref >60)
Glucose, Bld: 97 mg/dL (ref 70–99)
Potassium: 4.1 mmol/L (ref 3.5–5.1)
Sodium: 139 mmol/L (ref 135–145)
Total Bilirubin: 0.6 mg/dL (ref 0.3–1.2)
Total Protein: 7.1 g/dL (ref 6.5–8.1)

## 2019-11-05 LAB — PHOSPHORUS: Phosphorus: 2.7 mg/dL (ref 2.5–4.6)

## 2019-11-05 LAB — MAGNESIUM: Magnesium: 2.1 mg/dL (ref 1.7–2.4)

## 2019-11-05 MED ORDER — PROSOURCE TF PO LIQD: ORAL | 10 refills | Status: DC

## 2019-11-05 MED ORDER — OSMOLITE 1.5 CAL PO LIQD: ORAL | 0 refills | Status: DC

## 2019-11-05 NOTE — Progress Notes (Signed)
Nutrition  Tube feeding orders entered.   Nahima Ales B. Zenia Resides, [place], [place] Registered Dietitian [phone] (pager)

## 2019-11-05 NOTE — Progress Notes (Signed)
Nutrition  Nutrition follow-up completed with patient and wife today in office.  Patient completed radiation therapy for supraglottis cancer on April 27th. Patient to have PEG tube placement in IR on 11/06/19. Patient and wife met with oncology navigator on 10/31/19 for   Patient continues with poor oral intake. Patient weighed 184.6 lbs today in office, keys and wallet were left in his pants pocket, stated that is how he normally weighs in the clinic. On 5/12, he weighed 191.25 lbs, and on 5/18 he weighed 185.02 lb. Wife reports that patient had a couple of bites of oatmeal this morning, and 1/2 cup of mashed turnips for dinner last night. Patient has not been drinking Ensure supplements over the past couple of days. He continues to drink water, patient reports less than 2 bottles daily.   Labs: 5/26 Mg 2.1 (WNL), K 4.1 (WNL), P 2.7 (WNL)  Nutrition diagnosis: Inadequate oral intake continues  New estimated nutrition needs: 2100-2300 kcal, 105-115 gram of protein, 2.3 L  Intervention: Provided tube feeding education with patient and patient's wife. Patient will start with 1/2 carton of Osmolite 1.5 via G-tube with 90 ml of free water before and after each bolus 4 times daily. He will gradually add 1/2 carton each day until goal rate of 1.5 cartons 4 times daily. Patient will consume an additional 8 ounces of water by mouth or provide via flush daily. Once patient has reached goal rate, he will be given education on adding protein modular (give 30 ml ProSource mixed with 30 ml water and flush with 60 ml water twice daily) Written information was provided. Teach back method was used.   Patient is at risk for refeeding syndrome. Continue to check refeeding labs twice a week.   Tube feed regimen at goal 6 bottles Osmolite 1.5 with 30 ml ProSource BID and free water will provide 2330 kcal, 109 grams of protein, and 2166 ml water.   Orders written and [place] has been contacted.   Monitoring,  Evaluation, and Goals:  Patient will tolerate tube feeding advancement to meet greater than 90% of estimated nutrition needs.   Next visit: Tuesday, June 1 via telephone  Lajuan Lines, RD, LDN Clinical Nutrition After Hours/Weekend Pager # in [place]

## 2019-11-06 ENCOUNTER — Encounter (HOSPITAL_COMMUNITY): Payer: Self-pay

## 2019-11-06 ENCOUNTER — Other Ambulatory Visit: Payer: Self-pay

## 2019-11-06 ENCOUNTER — Ambulatory Visit (HOSPITAL_COMMUNITY)
Admission: RE | Admit: 2019-11-06 | Discharge: 2019-11-06 | Disposition: A | Discharge status: Home / Self Care | Payer: Medicare Other | Source: Ambulatory Visit | Attending: Radiation Oncology | Admitting: Radiation Oncology

## 2019-11-06 DIAGNOSIS — M1990 Unspecified osteoarthritis, unspecified site: Secondary | ICD-10-CM | POA: Diagnosis not present

## 2019-11-06 DIAGNOSIS — Z880 Allergy status to penicillin: Secondary | ICD-10-CM | POA: Insufficient documentation

## 2019-11-06 DIAGNOSIS — Z7982 Long term (current) use of aspirin: Secondary | ICD-10-CM | POA: Diagnosis not present

## 2019-11-06 DIAGNOSIS — C321 Malignant neoplasm of supraglottis: Secondary | ICD-10-CM | POA: Diagnosis not present

## 2019-11-06 DIAGNOSIS — Z87891 Personal history of nicotine dependence: Secondary | ICD-10-CM | POA: Diagnosis not present

## 2019-11-06 DIAGNOSIS — Z9049 Acquired absence of other specified parts of digestive tract: Secondary | ICD-10-CM | POA: Diagnosis not present

## 2019-11-06 DIAGNOSIS — Z8546 Personal history of malignant neoplasm of prostate: Secondary | ICD-10-CM | POA: Diagnosis not present

## 2019-11-06 DIAGNOSIS — E785 Hyperlipidemia, unspecified: Secondary | ICD-10-CM | POA: Diagnosis not present

## 2019-11-06 DIAGNOSIS — J439 Emphysema, unspecified: Secondary | ICD-10-CM | POA: Insufficient documentation

## 2019-11-06 DIAGNOSIS — I10 Essential (primary) hypertension: Secondary | ICD-10-CM | POA: Diagnosis not present

## 2019-11-06 DIAGNOSIS — Z85038 Personal history of other malignant neoplasm of large intestine: Secondary | ICD-10-CM | POA: Insufficient documentation

## 2019-11-06 DIAGNOSIS — Z79899 Other long term (current) drug therapy: Secondary | ICD-10-CM | POA: Insufficient documentation

## 2019-11-06 HISTORY — PX: IR GASTROSTOMY TUBE MOD SED: IMG625

## 2019-11-06 LAB — CBC
HCT: 45 % (ref 39.0–52.0)
Hemoglobin: 14.6 g/dL (ref 13.0–17.0)
MCH: 32.9 pg (ref 26.0–34.0)
MCHC: 32.4 g/dL (ref 30.0–36.0)
MCV: 101.4 fL — ABNORMAL HIGH (ref 80.0–100.0)
Platelets: 310 10*3/uL (ref 150–400)
RBC: 4.44 MIL/uL (ref 4.22–5.81)
RDW: 13.9 % (ref 11.5–15.5)
WBC: 6.3 10*3/uL (ref 4.0–10.5)
nRBC: 0 % (ref 0.0–0.2)

## 2019-11-06 LAB — PROTIME-INR
INR: 1.1 (ref 0.8–1.2)
Prothrombin Time: 13.6 seconds (ref 11.4–15.2)

## 2019-11-06 MED ORDER — IOHEXOL 300 MG/ML  SOLN
50.0000 mL | Freq: Once | INTRAMUSCULAR | Status: AC | PRN
  Administered 2019-11-06: 10 mL

## 2019-11-06 MED ORDER — GLUCAGON HCL RDNA (DIAGNOSTIC) 1 MG IJ SOLR
INTRAMUSCULAR | Status: AC
  Filled 2019-11-06: qty 1

## 2019-11-06 MED ORDER — LIDOCAINE HCL 1 % IJ SOLN
INTRAMUSCULAR | Status: AC | PRN
  Administered 2019-11-06: 5 mL

## 2019-11-06 MED ORDER — VANCOMYCIN HCL IN DEXTROSE 1-5 GM/200ML-% IV SOLN
1000.0000 mg | Freq: Once | INTRAVENOUS | Status: AC
  Administered 2019-11-06: 1000 mg via INTRAVENOUS

## 2019-11-06 MED ORDER — HYDROCODONE-ACETAMINOPHEN 5-325 MG PO TABS: 1.0000 | ORAL_TABLET | ORAL | Status: DC | PRN

## 2019-11-06 MED ORDER — VANCOMYCIN HCL IN DEXTROSE 1-5 GM/200ML-% IV SOLN
INTRAVENOUS | Status: AC
  Filled 2019-11-06: qty 200

## 2019-11-06 MED ORDER — ONDANSETRON HCL 4 MG/2ML IJ SOLN: 4.0000 mg | INTRAMUSCULAR | Status: DC | PRN

## 2019-11-06 MED ORDER — LIDOCAINE HCL 1 % IJ SOLN
INTRAMUSCULAR | Status: AC
  Filled 2019-11-06: qty 20

## 2019-11-06 MED ORDER — FENTANYL CITRATE (PF) 100 MCG/2ML IJ SOLN
INTRAMUSCULAR | Status: AC
  Filled 2019-11-06: qty 2

## 2019-11-06 MED ORDER — MIDAZOLAM HCL 2 MG/2ML IJ SOLN
INTRAMUSCULAR | Status: AC
  Filled 2019-11-06: qty 2

## 2019-11-06 MED ORDER — SODIUM CHLORIDE 0.9 % IV SOLN: INTRAVENOUS | Status: DC

## 2019-11-06 MED ORDER — FENTANYL CITRATE (PF) 100 MCG/2ML IJ SOLN
INTRAMUSCULAR | Status: AC | PRN
  Administered 2019-11-06: 50 ug via INTRAVENOUS

## 2019-11-06 MED ORDER — MIDAZOLAM HCL 2 MG/2ML IJ SOLN
INTRAMUSCULAR | Status: AC | PRN
  Administered 2019-11-06 (×2): 1 mg via INTRAVENOUS

## 2019-11-06 NOTE — H&P (Signed)
Chief Complaint: Patient was seen in consultation today for supraglottic cancer  Referring Physician(s): Squire,Sarah  Supervising Physician: Aletta Edouard  Patient Status: [facility] - Out-pt  History of Present Illness: Julian Brown is a 86 y.o. 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  He now followed with [facility] and has been noted to have poor PO intake with significant recent weight loss.  He has met with outpatient dietitian to improve his PO intake and despite maximizing supplementation, has not been able to consume adequate nutrition.  He presents to IR today for gastrostomy tube placement.   Patient assessed in short stay.  He confirms the above history.  He is alert and oriented.  He is able to demonstrate understanding of the procedure today and agrees to move forward.  RN on the phone with patient's wife during visit who confirms she has a nutrition plan from [facility] and supplies at home.  He has been NPO today.  He does not take blood thinners.   Past Medical History:  Diagnosis Date  . Acute gangrenous cholecystitis s/p lap cholecystectomy 06/08/2017 06/08/2017  . Arthritis   . Colon cancer ([place])   . ED (erectile dysfunction)   . Emphysema of lung ([place])   . Hyperlipemia   . Hypertension   . Prostate cancer [facility])    prostate cancer-tx radiation  . Wears glasses     Past Surgical History:  Procedure Laterality Date  . APPENDECTOMY    . CARPAL TUNNEL RELEASE  5/12   lt  . CARPAL TUNNEL RELEASE  12/13/2011   Procedure: CARPAL TUNNEL RELEASE;  Surgeon: [name]., MD;  Location: [place];  Service: Orthopedics;  Laterality: Right;  and inject right wrist  . CHOLECYSTECTOMY N/A 06/08/2017   Procedure: LAPAROSCOPIC CHOLECYSTECTOMY WITH LYSIS OF ADHESIONS;  Surgeon: [name], MD;  Location: [facility];   Service: General;  Laterality: N/A;  . Opp   hemicolectomy-rt-ca  . COLONOSCOPY     about 12 inches of colon removed due to colon cancer    Allergies: Penicillins  Medications: Prior to Admission medications   Medication Sig Start Date End Date Taking? Authorizing Provider  acetaminophen (TYLENOL) 500 MG tablet Take 1,000 mg by mouth every 6 (six) hours as needed for moderate pain.   Yes [provider]  aspirin EC 81 MG tablet Take 81 mg by mouth daily.   Yes [provider]  BREO ELLIPTA 100-25 MCG/INH AEPB INHALE 1 PUFF INTO THE LUNGS ONCE DAILY. Patient taking differently: Inhale 1 puff into the lungs daily.  08/19/19  Yes Brand Males, MD  Cholecalciferol (VITAMIN D) 50 MCG (2000 UT) tablet Take 2,000 Units by mouth daily.   Yes [provider]  fluticasone (FLONASE) 50 MCG/ACT nasal spray Place 1 spray into both nostrils daily as needed for allergies.  03/18/19  Yes [provider]  lidocaine (XYLOCAINE) 2 % solution Patient: Mix 1part 2% viscous lidocaine, 1part H20. Swish & swallow 76m of diluted mixture, 375m before meals and at bedtime, up to QID Patient taking differently: Use as directed 15 mLs in the mouth or throat See admin instructions. Patient: Mix 1part 2% viscous lidocaine, 1part H20. Swish & swallow 1051mf diluted mixture, 8m79mefore meals and at bedtime as needed mouth/throat pain 08/26/19  Yes SquiEppie Gibson  Polyethyl Glycol-Propyl Glycol (  SYSTANE OP) Place 1 drop into both eyes daily as needed (dry eyes).   Yes [provider]  sodium fluoride (PREVIDENT 5000 PLUS) 1.1 % CREA dental cream Apply to tooth brush. Brush teeth for 2 minutes. Spit out excess-DO NOT swallow. Repeat nightly. Patient taking differently: Place 1 application onto teeth at bedtime. Apply to tooth brush. Brush teeth for 2 minutes. Spit out excess-DO NOT swallow. Repeat nightly. 08/08/19  Yes Lenn Cal, DDS  SPIRIVA  RESPIMAT 1.25 MCG/ACT AERS USE 2 PUFFS [facility] DAY Patient taking differently: Inhale 2 puffs into the lungs daily.  05/29/19  Yes Brand Males, MD  Wound Dressings (SONAFINE EX) Apply 1 application topically daily.   Yes [provider]  amLODipine (NORVASC) 10 MG tablet Take 10 mg by mouth daily.    [provider]  docusate sodium (COLACE) 100 MG capsule Take 2 capsules (200 mg total) by mouth daily. Take while on Hycet to prevent constipation. Patient not taking: Reported on 10/31/2019 09/30/19   Eppie Gibson, MD  gabapentin (NEURONTIN) 300 MG capsule Take 1 capsule (300 mg total) by mouth 3 (three) times daily. Patient not taking: Reported on 10/31/2019 09/23/19   Eppie Gibson, MD  HYDROcodone-acetaminophen (HYCET) 7.5-325 mg/15 ml solution Take 15 mLs by mouth 3 (three) times daily as needed for moderate pain. Take with food. Patient not taking: Reported on 10/31/2019 09/30/19   Eppie Gibson, MD  Multiple Vitamins-Minerals (MULTIVITAMIN WITH MINERALS) tablet Take 1 tablet by mouth daily. Patient not taking: Reported on 10/31/2019 10/31/19   Eppie Gibson, MD  Nutritional Supplements (FEEDING SUPPLEMENT, OSMOLITE 1.5 CAL,) LIQD Give 1.5 cartons of formula at 8am, noon, 4pm and 8pm.  Flush with 6m of water before and after feeding. Will need to drink additional 2481mwater or give via tube to meet hydration needs.  Meets 100% of estimated needs.  Send bolus supplies. 11/05/19   SqEppie GibsonMD  Nutritional Supplements (PROSOURCE TF) LIQD Give 3091mID via feeding tube.  Mix with 30-38m59m water with prosource.  Flush tube with 30-60 ml of water first, then give prosource and water mixture and flush with 30-38ml92mer. 11/05/19   SquirEppie Gibson olmesartan-hydrochlorothiazide (BENICAR HCT) 40-12.5 MG tablet Take 1 tablet by mouth daily.     [provider]  rosuvastatin (CRESTOR) 10 MG tablet Take 10 mg by mouth daily.    [provider]  thiamine 100 MG  tablet Take 1 tablet (100 mg total) by mouth daily. Patient not taking: Reported on 10/31/2019 10/31/19   SquirEppie Gibson    History reviewed. No pertinent family history.  Social History   Socioeconomic History  . Marital status: Married    Spouse name: Not on file  . Number of children: 2  . Years of education: Not on file  . Highest education level: Not on file  Occupational History  . Not on file  Tobacco Use  . Smoking status: Former Smoker    Packs/day: 1.00    Years: 36.00    Pack years: 36.00    Types: Cigarettes    Quit date: 12/09/1986    Years since quitting: 32.9  . Smokeless tobacco: Never Used  Substance and Sexual Activity  . Alcohol use: Yes    Comment: occ  . Drug use: No  . Sexual activity: Not on file  Other Topics Concern  . Not on file  Social History Narrative  . Not on file   Social Determinants of Health  Financial Resource Strain:   . Difficulty of Paying Living Expenses:   Food Insecurity:   . Worried About Charity fundraiser in the Last Year:   . Arboriculturist in the Last Year:   Transportation Needs:   . Film/video editor (Medical):   Marland Kitchen Lack of Transportation (Non-Medical):   Physical Activity:   . Days of Exercise per Week:   . Minutes of Exercise per Session:   Stress:   . Feeling of Stress :   Social Connections:   . Frequency of Communication with Friends and Family:   . Frequency of Social Gatherings with Friends and Family:   . Attends Religious Services:   . Active Member of Clubs or Organizations:   . Attends Archivist Meetings:   Marland Kitchen Marital Status:      Review of Systems: A 12 point ROS discussed and pertinent positives are indicated in the HPI above.  All other systems are negative.  Review of Systems  Constitutional: Negative for fatigue and fever.  Respiratory: Negative for cough and shortness of breath.   Cardiovascular: Negative for chest pain.  Gastrointestinal: Negative for abdominal pain,  diarrhea, nausea and vomiting.  Genitourinary: Negative for dysuria and urgency.  Musculoskeletal: Negative for back pain.  Psychiatric/Behavioral: Negative for behavioral problems and confusion.    Vital Signs: BP (!) 149/56   Pulse 72   Temp 98 F (36.7 C) (Skin)   Resp 17   Ht 5' 11"  (1.803 m)   Wt 185 lb (83.9 kg)   SpO2 94%   BMI 25.80 kg/m   Physical Exam Vitals and nursing note reviewed.  Constitutional:      General: He is not in acute distress.    Appearance: Normal appearance. He is not ill-appearing.  HENT:     Mouth/Throat:     Mouth: Mucous membranes are moist.     Pharynx: Oropharynx is clear.  Cardiovascular:     Rate and Rhythm: Normal rate and regular rhythm.     Pulses: Normal pulses.  Pulmonary:     Effort: Pulmonary effort is normal. No respiratory distress.     Breath sounds: Normal breath sounds.  Abdominal:     General: Abdomen is flat. Bowel sounds are normal. There is no distension.     Palpations: Abdomen is soft.     Tenderness: There is no abdominal tenderness.  Skin:    General: Skin is warm and dry.  Neurological:     General: No focal deficit present.     Mental Status: He is alert and oriented to person, place, and time. Mental status is at baseline.  Psychiatric:        Mood and Affect: Mood normal.        Behavior: Behavior normal.        Thought Content: Thought content normal.        Judgment: Judgment normal.      MD Evaluation Airway: WNL Heart: WNL Abdomen: WNL Chest/ Lungs: WNL ASA  Classification: 3 Mallampati/Airway Score: One   Imaging: No results found.  Labs:  CBC: Recent Labs    04/23/19 1234 08/13/19 0932  WBC 8.3 7.8  HGB 15.6 15.7  HCT 45.7 45.8  PLT 303.0 228    COAGS: No results for input(s): INR, APTT in the last 8760 hours.  BMP: Recent Labs    08/13/19 0932 10/04/19 1047 10/29/19 1531 11/05/19 1034  NA 139 141 143 139  K 4.1 3.9 3.7 4.1  CL 102 101 103 104  CO2 27 28 28 30     GLUCOSE 100* 135* 135* 97  BUN 21 20 26* 16  CALCIUM 10.0 10.2 11.2* 10.4*  CREATININE 0.93 0.95 1.05 1.01  GFRNONAA >60 >60 >60 >60  GFRAA >60 >60 >60 >60    LIVER FUNCTION TESTS: Recent Labs    08/13/19 0932 11/05/19 1034  BILITOT 0.5 0.6  AST 14* 23  ALT 12 34  ALKPHOS 57 69  PROT 7.2 7.1  ALBUMIN 3.9 3.1*    TUMOR MARKERS: No results for input(s): AFPTM, CEA, CA199, CHROMGRNA in the last 8760 hours.  Assessment and Plan: Patient with past medical history of supraglottic cancer presents with complaint of poor PO intake, insufficient PO intake to meet estimated nutrition needs evidenced by ongoing weight loss.  IR consulted for percutaneous gastrostomy tube placement at the request of Dr. Isidore Moos. Case reviewed by Dr. Kathlene Cote who approves patient for procedure.  Patient presents today in their usual state of health.  He has been NPO and is not currently on blood thinners.    Risks and benefits image guided gastrostomy tube placement was discussed with the patient including, but not limited to the need for a barium enema during the procedure, bleeding, infection, peritonitis and/or damage to adjacent structures.  All of the patient's questions were answered, patient is agreeable to proceed.  Consent signed and in chart.   Thank you for this interesting consult.  I greatly enjoyed meeting Julian Brown and look forward to participating in their care.  A copy of this report was sent to the requesting provider on this date.  Electronically Signed: Docia Barrier, PA 11/06/2019, 8:48 AM   I spent a total of  30 Minutes   in face to face in clinical consultation, greater than 50% of which was counseling/coordinating care for supraglottic cancer

## 2019-11-06 NOTE — Discharge Instructions (Addendum)
Gastrostomy Tube Replacement, Care After This sheet gives you information about how to care for yourself after your procedure. Your health care provider may also give you more specific instructions. If you have problems or questions, contact your health care provider. What can I expect after the procedure? After the procedure, it is common to have:  Mild pain in your abdomen.  A small amount of blood-tinged fluid leaking from the replacement site. Follow these instructions at home:   You may resume your normal level of activity.  You may resume your normal feedings.  Wash your hands before and after caring for your gastrostomy tube.  Check the skin around the tube site insertion for redness, a rash, swelling, drainage, or extra tissue growth. If you notice any of these, call your health care provider.  Care for your gastrostomy tube as you did before, or as directed by your health care provider. Contact a health care provider if:  You have a fever or chills.  You have redness or irritation near the insertion site.  You continue to have pain in the abdomen or leaking around your gastrostomy tube. Get help right away if:  You develop bleeding or a lot of discharge around the tube.  You have severe pain in the abdomen.  Your new tube is not working properly.  You are unable to get feedings into the tube.  Your tube comes out for any reason. Summary  Follow specific instructions as told by your health care provider. If you have problems or questions, contact your health care provider.  After the procedure you may resume your normal level of activity and normal feedings.  Care for your gastrostomy tube as you did before, or as directed by your health care provider. This information is not intended to replace advice given to you by your health care provider. Make sure you discuss any questions you have with your health care provider. Document Revised: 07/05/2017 Document  Reviewed: 07/05/2017 Elsevier Patient Education  [place]. Gastrostomy Tube Home Guide, Adult A gastrostomy tube, or G-tube, is a tube that is inserted through the abdomen into the stomach. The tube is used to give feedings and medicines when a person is unable to eat and drink enough on his or her own. How to care for a G-tube Supplies needed  Saline solution or clean, warm water and soap.  Cotton swab or gauze.  Precut gauze bandage (dressing) and tape, if needed. Instructions 1. Wash your hands with soap and water. 2. If there is a dressing between the person's skin and the tube, remove it. 3. Check the area where the tube enters the skin. Check for problems such as: ? Redness. ? Swelling. ? Pus-like drainage. ? Extra skin growth. 4. Moisten the cotton swab with the saline solution or soap and water mixture. Gently clean around the insertion site. Remove any drainage or crusting. ? When the G-tube is first put in, a normal saline solution or water can be used to clean the skin. ? Mild soap and warm water can be used when the skin around the G-tube site has healed. 5. If there should be a dressing between the person's skin and the tube, apply it at this time. How to flush a G-tube Flush the G-tube regularly to keep it from clogging. Flush it before and after feedings and as often as told by the health care provider. Supplies needed  Purified or sterile water, warmed. If the person has a weak disease-fighting (immune)  system, or if he or she has difficulty fighting off infections (is immunocompromised), use only sterile water. ? If you are unsure about the amount of chemical contaminants in purified or drinking water, use sterile water. ? To purify drinking water by boiling:  Boil water for at least 1 minute. Keep lid over water while it boils. Allow water to cool to room temperature before using.  60cc G-tube syringe. Instructions 1. Wash your hands with soap and  water. 2. Draw up 30 mL of warm water in a syringe. 3. Connect the syringe to the tube. 4. Slowly and gently push the water into the tube. G-tube problems and solutions  If the tube comes out: ? Cover the opening with a clean dressing and tape. ? Call a health care provider right away. ? A health care provider will need to put the tube back in within 4 hours.  If there is skin or scar tissue growing where the tube enters the skin: ? Keep the area clean and dry. ? Secure the tube with tape so that the tube does not move around too much. ? Call a health care provider.  If the tube gets clogged: ? Slowly push warm water into the tube with a large syringe. ? Do not force the fluid into the tube or push an object into the tube. ? If you are not able to unclog the tube, call a health care provider right away. Follow these instructions at home: Feedings  Give feedings at room temperature.  Cover and place unused feedings in the refrigerator.  If feedings are continuous: ? Do not put more than 4 hours worth of feedings in the feeding bag. ? Stop the feedings when you need to give medicine or flush the tube. Be sure to restart the feedings. ? Make sure the person's head is above his or her stomach (upright position). This will prevent choking and discomfort.  Replace feeding bags and syringes as told by the health care provider.  Make sure the person is in the right position during and after feedings: ? During feedings, the person's position should be in the upright position. ? After a noncontinuous feeding (bolus feeding), have the person stay in the upright position for 1 hour. General instructions  Only use syringes made for G-tubes.  Do not pull or put tension on the tube.  Clamp the tube before removing the cap or disconnecting a syringe.  Measure the length of the G-tube every day from the insertion site to the end of the tube.  If the person's G-tube has a balloon, check  the fluid in the balloon every week. The amount of fluid that should be in the balloon can be found in the manufacturer's specifications.  Make sure the person takes care of his or her oral health, such as by brushing his or her teeth.  Remove excess air from the G-tube as told by the person's health care provider. This is called "venting."  Keep the area where the tube enters the skin clean and dry.  Do not push feedings, medicines, or flushes rapidly. Contact a health care provider if:  The person with the tube has any of these problems: ? Constipation. ? Fever.  There is a large amount of fluid or mucus-like liquid leaking from the tube.  Skin or scar tissue appears to be growing where the tube enters the skin.  The length of tube from the insertion site to the G-tube gets longer. Get help right  away if:  The person with the tube has any of these problems: ? Severe abdominal pain. ? Severe tenderness. ? Severe bloating. ? Nausea. ? Vomiting. ? Trouble breathing. ? Shortness of breath.  Any of these problems happen in the area where the tube enters the skin: ? Redness, irritation, swelling, or soreness. ? Pus-like discharge. ? A bad smell.  The tube is clogged and cannot be flushed.  The tube comes out. Summary  A gastrostomy tube, or G-tube, is a tube that is inserted through the abdomen into the stomach. The tube is used to give feedings and medicines when a person is unable to eat and drink enough on his or her own.  Check and clean the insertion site daily as told by the person's health care provider.  Flush the G-tube regularly to keep it from clogging. Flush it before and after feedings and as often as told by the person's health care provider.  Keep the area where the tube enters the skin clean and dry. This information is not intended to replace advice given to you by your health care provider. Make sure you discuss any questions you have with your health  care provider. Document Revised: 05/12/2017 Document Reviewed: 07/25/2016 Elsevier Patient Education  [place]. Moderate Conscious Sedation, Adult, Care After These instructions provide you with information about caring for yourself after your procedure. Your health care provider may also give you more specific instructions. Your treatment has been planned according to current medical practices, but problems sometimes occur. Call your health care provider if you have any problems or questions after your procedure. What can I expect after the procedure? After your procedure, it is common:  To feel sleepy for several hours.  To feel clumsy and have poor balance for several hours.  To have poor judgment for several hours.  To vomit if you eat too soon. Follow these instructions at home: For at least 24 hours after the procedure:   Do not: ? Participate in activities where you could fall or become injured. ? Drive. ? Use heavy machinery. ? Drink alcohol. ? Take sleeping pills or medicines that cause drowsiness. ? Make important decisions or sign legal documents. ? Take care of children on your own.  Rest. Eating and drinking  Follow the diet recommended by your health care provider.  If you vomit: ? Drink water, juice, or soup when you can drink without vomiting. ? Make sure you have little or no nausea before eating solid foods. General instructions  Have a responsible adult stay with you until you are awake and alert.  Take over-the-counter and prescription medicines only as told by your health care provider.  If you smoke, do not smoke without supervision.  Keep all follow-up visits as told by your health care provider. This is important. Contact a health care provider if:  You keep feeling nauseous or you keep vomiting.  You feel light-headed.  You develop a rash.  You have a fever. Get help right away if:  You have trouble breathing. This  information is not intended to replace advice given to you by your health care provider. Make sure you discuss any questions you have with your health care provider. Document Revised: 05/12/2017 Document Reviewed: 09/19/2015 Elsevier Patient Education  2020 [employer].

## 2019-11-06 NOTE — Procedures (Signed)
Interventional Radiology Procedure Note  Procedure: Percutaneous gastrostomy tube placement  Complications: None  Estimated Blood Loss: < 10 mL  Findings: 20 Fr bumper retention gastrostomy tube placed with tip in body of stomach. OK to use in 24 hours.  Sarajane Fambrough T. Reyden Smith, M.D Pager:  [phone]   

## 2019-11-06 NOTE — Progress Notes (Signed)
Ambulated in room tol well  

## 2019-11-06 NOTE — Progress Notes (Signed)
Discharge instructions reviewed with pt and his wife [name] (via telephone) both voice understanding. [name] states that [place] discussed with her and Mr Vallier  how to use and care for G tube.

## 2019-11-08 ENCOUNTER — Other Ambulatory Visit: Payer: Self-pay

## 2019-11-08 ENCOUNTER — Ambulatory Visit
Admission: RE | Admit: 2019-11-08 | Discharge: 2019-11-08 | Disposition: A | Discharge status: Home / Self Care | Payer: Medicare Other | Source: Ambulatory Visit | Attending: Radiation Oncology | Admitting: Radiation Oncology

## 2019-11-08 DIAGNOSIS — C321 Malignant neoplasm of supraglottis: Secondary | ICD-10-CM

## 2019-11-08 LAB — CMP (CANCER CENTER ONLY)
ALT: 31 U/L (ref 0–44)
AST: 23 U/L (ref 15–41)
Albumin: 3 g/dL — ABNORMAL LOW (ref 3.5–5.0)
Alkaline Phosphatase: 69 U/L (ref 38–126)
Anion gap: 10 (ref 5–15)
BUN: 15 mg/dL (ref 8–23)
CO2: 28 mmol/L (ref 22–32)
Calcium: 10.3 mg/dL (ref 8.9–10.3)
Chloride: 101 mmol/L (ref 98–111)
Creatinine: 0.95 mg/dL (ref 0.61–1.24)
GFR, Est AFR Am: >60 mL/min (ref >60)
GFR, Estimated: >60 mL/min (ref >60)
Glucose, Bld: 109 mg/dL — ABNORMAL HIGH (ref 70–99)
Potassium: 3.9 mmol/L (ref 3.5–5.1)
Sodium: 139 mmol/L (ref 135–145)
Total Bilirubin: 0.8 mg/dL (ref 0.3–1.2)
Total Protein: 6.9 g/dL (ref 6.5–8.1)

## 2019-11-08 LAB — MAGNESIUM: Magnesium: 2 mg/dL (ref 1.7–2.4)

## 2019-11-08 LAB — PHOSPHORUS: Phosphorus: 2.3 mg/dL — ABNORMAL LOW (ref 2.5–4.6)

## 2019-11-12 ENCOUNTER — Ambulatory Visit
Admission: RE | Admit: 2019-11-12 | Discharge: 2019-11-12 | Disposition: A | Discharge status: Home / Self Care | Payer: Medicare Other | Source: Ambulatory Visit | Attending: Internal Medicine | Admitting: Internal Medicine

## 2019-11-12 ENCOUNTER — Other Ambulatory Visit: Payer: Self-pay | Admitting: Radiation Oncology

## 2019-11-12 ENCOUNTER — Telehealth: Payer: Self-pay

## 2019-11-12 ENCOUNTER — Other Ambulatory Visit: Payer: Self-pay

## 2019-11-12 ENCOUNTER — Inpatient Hospital Stay: Payer: Medicare Other | Attending: Internal Medicine | Admitting: Nutrition

## 2019-11-12 DIAGNOSIS — C321 Malignant neoplasm of supraglottis: Secondary | ICD-10-CM | POA: Insufficient documentation

## 2019-11-12 LAB — CMP (CANCER CENTER ONLY)
ALT: 20 U/L (ref 0–44)
AST: 16 U/L (ref 15–41)
Albumin: 2.9 g/dL — ABNORMAL LOW (ref 3.5–5.0)
Alkaline Phosphatase: 57 U/L (ref 38–126)
Anion gap: 9 (ref 5–15)
BUN: 16 mg/dL (ref 8–23)
CO2: 28 mmol/L (ref 22–32)
Calcium: 10 mg/dL (ref 8.9–10.3)
Chloride: 102 mmol/L (ref 98–111)
Creatinine: 0.86 mg/dL (ref 0.61–1.24)
GFR, Est AFR Am: >60 mL/min (ref >60)
GFR, Estimated: >60 mL/min (ref >60)
Glucose, Bld: 116 mg/dL — ABNORMAL HIGH (ref 70–99)
Potassium: 4.3 mmol/L (ref 3.5–5.1)
Sodium: 139 mmol/L (ref 135–145)
Total Bilirubin: 0.3 mg/dL (ref 0.3–1.2)
Total Protein: 6.6 g/dL (ref 6.5–8.1)

## 2019-11-12 LAB — PHOSPHORUS: Phosphorus: 2.6 mg/dL (ref 2.5–4.6)

## 2019-11-12 LAB — MAGNESIUM: Magnesium: 2 mg/dL (ref 1.7–2.4)

## 2019-11-12 MED ORDER — SODIUM-POTASSIUM-PHOSPHORUS 160-280-250 MG PO PACK: PACK | ORAL | 0 refills | Status: DC

## 2019-11-12 NOTE — Progress Notes (Signed)
Nutrition follow-up completed with patient and his wife.  Patient is status post radiation therapy for supraglottis cancer on April 27. Patient had feeding tube placed on May 26. Patient is tolerating 4 cartons of Osmolite 1.5 daily split into 4 feedings.  He is increasing by one half carton daily until he gets to goal rate of 6 cartons daily.  Wife reports patient is tolerating tube feeding advancement. She is flushing his feeding tube without difficulty and gives 90 mL free water before and after each bolus feeding. Patient is eating some foods such as sausage biscuits, chicken thighs, country fried steak, mashed potatoes, and macaroni and cheese.  He seems to like boost plus better than Ensure sometimes drinks 1 bottle per day. Patient still eating randomly with no particular pattern or meal. Patient denies nausea, vomiting, and constipation.  He also denies sore mouth/throat.  He continues to have thick mucus. Noted some loose stools especially after barium given for tube placement.  Weight improved and was documented as 191.6 pounds today, increased from 185 pounds May 26.  Labs on 5/28:Potassium 3.9, glucose 109, albumin 3.0, phosphorus 2.3, and magnesium 2.0.  Patient prescribed oral phosphorus however he has not started this yet because pharmacy had to order.  Radiation oncology RN was notified.  New labs pending today.  Estimated nutrition needs: 2100-2300 cal, 105-115 g protein, 2.3 L fluid.  Nutrition diagnosis: Inadequate oral intake continues.  Intervention: Patient will continue to increase Osmolite 1.5 x 1/2 carton daily until goal rate of 6 cartons daily achieved. He will continue free water flushes of 90 mL free water 4 times daily before and after each bolus feeding. Hold Prosource protein supplement for now. RN notified regarding oral phosphorus delay. Will monitor labs.  Patient scheduled for nutrition follow-up on Monday, June 7. Encourage patient to continue oral  intake.  Monitoring, evaluation, goals: Patient will continue tube feeding advancement to goal rate of 6 bottles daily to provide 2130 cal 89.4 g protein 1146 mL free water. Patient will continue 3 ounces of protein minimum per day to provide an additional 21 g of protein. Monitor refeeding labs. Tolerance of tube feeding to meet 90% of estimated nutrition needs.  Next visit: Monday, June 7 in person.  **Disclaimer: This note was dictated with voice recognition software. Similar sounding words can inadvertently be transcribed and this note may contain transcription errors which may not have been corrected upon publication of note.**

## 2019-11-12 NOTE — Telephone Encounter (Signed)
-----   Message from Eppie Gibson, MD sent at 11/12/2019  8:42 AM EDT ----- Please let wife know that phosphorus supplementation Rx is [facility] when she is ready to pick it up.  Thanks ----- Message ----- From: Buel Ream, [facility] [place] Sent: 11/08/2019  12:02 PM EDT To: Eppie Gibson, MD

## 2019-11-12 NOTE — Telephone Encounter (Signed)
Called patient's home phone and left a VM to let he and his wife know that Dr. Isidore Moos sent a prescription for phosphorous supplementation to his pharmacy on file. Encouraged them call myself or Elisabeth Cara back should they have any questions concerns. Also called patient's wife's cell phone but did not get an answer, and was not able to leave a VM.

## 2019-11-13 NOTE — Progress Notes (Signed)
Oncology Nurse Navigator Documentation  Confirmed patient's attendance at 3:30 for tomorrow afternoons H&N MDC.  His wife understands to arrive at 2:30 for  for registration, lab, and then [place]. She is aware that they will be directed to Radiation Waiting where he will be met prior to the start of [place].  [name] RN, BSN, OCN Head & Neck Oncology Nurse [place] [facility] Phone # [phone]  Fax # [phone]

## 2019-11-14 ENCOUNTER — Ambulatory Visit: Payer: Medicare Other | Attending: Radiation Oncology

## 2019-11-14 ENCOUNTER — Other Ambulatory Visit: Payer: Self-pay

## 2019-11-14 ENCOUNTER — Ambulatory Visit
Admission: RE | Admit: 2019-11-14 | Discharge: 2019-11-14 | Disposition: A | Discharge status: Home / Self Care | Payer: Medicare Other | Source: Ambulatory Visit | Attending: Radiation Oncology | Admitting: Radiation Oncology

## 2019-11-14 DIAGNOSIS — C321 Malignant neoplasm of supraglottis: Secondary | ICD-10-CM

## 2019-11-14 DIAGNOSIS — R1310 Dysphagia, unspecified: Secondary | ICD-10-CM | POA: Insufficient documentation

## 2019-11-14 LAB — CMP (CANCER CENTER ONLY)
ALT: 18 U/L (ref 0–44)
AST: 15 U/L (ref 15–41)
Albumin: 3 g/dL — ABNORMAL LOW (ref 3.5–5.0)
Alkaline Phosphatase: 64 U/L (ref 38–126)
Anion gap: 8 (ref 5–15)
BUN: 17 mg/dL (ref 8–23)
CO2: 28 mmol/L (ref 22–32)
Calcium: 10 mg/dL (ref 8.9–10.3)
Chloride: 101 mmol/L (ref 98–111)
Creatinine: 0.87 mg/dL (ref 0.61–1.24)
GFR, Est AFR Am: >60 mL/min (ref >60)
GFR, Estimated: >60 mL/min (ref >60)
Glucose, Bld: 130 mg/dL — ABNORMAL HIGH (ref 70–99)
Potassium: 4.4 mmol/L (ref 3.5–5.1)
Sodium: 137 mmol/L (ref 135–145)
Total Bilirubin: 0.3 mg/dL (ref 0.3–1.2)
Total Protein: 6.7 g/dL (ref 6.5–8.1)

## 2019-11-14 LAB — MAGNESIUM: Magnesium: 2 mg/dL (ref 1.7–2.4)

## 2019-11-14 LAB — PHOSPHORUS: Phosphorus: 3.1 mg/dL (ref 2.5–4.6)

## 2019-11-15 NOTE — Therapy (Signed)
[facility] [address] [place], [place], [address] Phone: [phone]   Fax:  [phone]  Speech Language Pathology Treatment/Renewal  Patient Details  Name: Julian Brown MRN: [id number] Date of Birth: 1933-03-10 Referring Provider (SLP): Eppie Gibson, MD   Encounter Date: 11/14/2019  End of Session - 11/15/19 0937    Visit Number  4    Number of Visits  6    Date for SLP Re-Evaluation  01/31/20    SLP Start Time  9233    SLP Stop Time   1610    SLP Time Calculation (min)  40 min    Activity Tolerance  Patient tolerated treatment well       Past Medical History:  Diagnosis Date  . Acute gangrenous cholecystitis s/p lap cholecystectomy 06/08/2017 06/08/2017  . Arthritis   . Colon cancer ([place])   . ED (erectile dysfunction)   . Emphysema of lung ([place])   . Hyperlipemia   . Hypertension   . Prostate cancer [facility])    prostate cancer-tx radiation  . Wears glasses     Past Surgical History:  Procedure Laterality Date  . APPENDECTOMY    . CARPAL TUNNEL RELEASE  5/12   lt  . CARPAL TUNNEL RELEASE  12/13/2011   Procedure: CARPAL TUNNEL RELEASE;  Surgeon: [name]., MD;  Location: [facility];  Service: Orthopedics;  Laterality: Right;  and inject right wrist  . CHOLECYSTECTOMY N/A 06/08/2017   Procedure: LAPAROSCOPIC CHOLECYSTECTOMY WITH LYSIS OF ADHESIONS;  Surgeon: [name], MD;  Location: [facility];  Service: General;  Laterality: N/A;  . [place]   hemicolectomy-rt-ca  . COLONOSCOPY     about 12 inches of colon removed due to colon cancer  . IR GASTROSTOMY TUBE MOD SED  11/06/2019    There were no vitals filed for this visit.  Subjective Assessment - 11/14/19 1533    Subjective  Pt was seen by Dr. Isidore Moos 10-22-19 and wife reported pt was uninterested in eating - drinking 2-3 Ensure/day but only scant solid food. Dietary on 11-12-19 with Dory Peru - pt eating some POs -  wife stated upon entry "He's eating better now."    Patient is accompained by:  --   wife   Currently in Pain?  No/denies            ADULT SLP TREATMENT - 11/15/19 0001      General Information   Behavior/Cognition  Alert;Cooperative;Requires cueing;Distractible      Treatment Provided   Treatment provided  Dysphagia      Dysphagia Treatment   Respiratory Status  Room air    Oral Cavity - Dentition  Missing dentition    Treatment Methods  Therapeutic exercise;Patient/caregiver education;Skilled observation    Patient observed directly with PO's  Yes    Type of PO's observed  Dysphagia 3 (soft);Thin liquids    Liquids provided via  Cup    Oral Phase Signs & Symptoms  Prolonged mastication    Pharyngeal Phase Signs & Symptoms  Other (comment)   none noted   Other treatment/comments  Pt placed with PEG Nov 06, 2019 due to concern over meeting caloric needs PO. Wife reports pt is eating more than at that time of PEG placement. Pt without overt s/s aspiration during trials of dys III/thin (ham sandwich/lunchmeat)  and thin - x12 boluses. Pt was WNL with HEP given introductory verbal cues, which is what wife provides pt at  home. Wife stated she does HEP with pt once/day so SLP reiterated BID.       Assessment / Recommendations / Plan   Plan  Continue with current plan of care      Dysphagia Recommendations   Diet recommendations  --   as tolerated   Liquids provided via  Cup    Medication Administration  --   as tolerated     Progression Toward Goals   Progression toward goals  Progressing toward goals       SLP Education - 11/15/19 0936    Education Details  HEP BID necessary for best chance to maintain WNL swallow function    Person(s) Educated  Patient;Spouse    Methods  Explanation    Comprehension  Verbalized understanding       SLP Short Term Goals - 10/17/19 1222      SLP SHORT TERM GOAL #1   Title  pt will perform HEP with occasional min A    Status   Partially Met      SLP SHORT TERM GOAL #2   Title  pt will tell SLP rationale for HEP completion with modified independence    Period  --   sessions   Status  Partially Met      SLP SHORT TERM GOAL #3   Title  pt and/or wife witll tell SLP 3 overt s/s aspiration PNA    Period  --   sessions   Status  Partially Met       SLP Long Term Goals - 11/15/19 0939      SLP LONG TERM GOAL #1   Title  pt and/or wife will state how a food journal can foster hastened return to most normalized diet    Status  Deferred      SLP LONG TERM GOAL #2   Title  pt will complete HEP for swallowing with rare min A over two sessions    Baseline  11-14-19    Time  2    Period  --   sessions   Status  On-going      SLP LONG TERM GOAL #3   Title  pt will tell SLP when he can decr frequency of HEP after radiation,with modified independence, over two sessions    Time  3    Period  --   sessions   Status  On-going       Plan - 11/15/19 0937    Clinical Impression Statement  Pt presents today with WNL/WFL swallowing ability with dys III and water, using small bites and sips. Pt was placed with PEG 11-06-19 due to limited PO intake over several days and concern for meeting caloric intake. Overt s/sx aspiration were not seen today with pt spontaneously taking small sips and bites. No overt s/sx aspiraiton PNA reported or observed today. See "other comments" for more details. Data suggests that as pts progress through rad therapy that their swallowing ability will decrease. Also, WNL swallowing is threatened by muscle fibrosis that will likely develop after rad is completed. Therefore, SLP developed an indivdualized exercise program to mitigate/eliminate muscle fibrosis following rad tx. Skilled ST would be beneficial to the pt in order to regularly assess pt's safety with POs and/or need for instrumental swallow assessment, as well as to assess proper completion of HEP.    Speech Therapy Frequency  --   once  approx every four weeks   Duration  --   3 visits   Treatment/Interventions  Aspiration precaution training;Pharyngeal strengthening exercises;Diet toleration management by SLP;Trials of upgraded texture/liquids;Patient/family education;Internal/external aids;SLP instruction and feedback;Compensatory techniques    Potential to Achieve Goals  Good    Potential Considerations  Ability to learn/carryover information   pt observed slower processing, decr'd memory (looked to wife for questions re: medical history, pt could not recall >2 details from yesterday's visit to cancer center)      Patient will benefit from skilled therapeutic intervention in order to improve the following deficits and impairments:   Dysphagia, unspecified type    Problem List Patient Active Problem List   Diagnosis Date Noted  . Malignant neoplasm of supraglottis ([place]) 08/08/2019  . Hoarseness of voice 06/12/2019  . Cough 05/23/2019  . Primary osteoarthritis of right wrist 07/31/2018  . Stage 2 moderate COPD by GOLD classification ([place]) 05/16/2018  . Allergic rhinitis 05/16/2018  . Nocturnal hypoxemia 05/16/2018  . Abnormal finding on lung imaging 05/16/2018  . Hypertension   . Hyperlipemia   . Acute gangrenous cholecystitis s/p lap cholecystectomy 06/08/2017 06/08/2017  . Pain in right wrist 01/04/2017  . Chronic pain of left knee 01/04/2017  . Chronic pain of right knee 01/04/2017  . Unilateral primary osteoarthritis, left knee 01/04/2017  . Unilateral primary osteoarthritis, right knee 01/04/2017    [facility] ,[place], CCC-SLP  11/15/2019, 12:43 PM  [facility] [address] [place], [place], [address] Phone: [phone]   Fax:  [phone]   Name: Julian Brown MRN: [id number] Date of Birth: 04/08/1933

## 2019-11-18 ENCOUNTER — Other Ambulatory Visit: Payer: Self-pay

## 2019-11-18 ENCOUNTER — Inpatient Hospital Stay: Payer: Medicare Other | Admitting: Nutrition

## 2019-11-18 NOTE — Progress Notes (Signed)
Nutrition follow-up completed with patient and wife.  Patient is status post radiation therapy for supraglottis cancer on April 27. Patient had feeding tube placed on May 26. He has been tolerating 6 cartons of Osmolite 1.5 daily, 1-1/2 cartons 4 times daily. He has tolerated these very well.  Denies nausea, vomiting, constipation, and diarrhea. Patient receives 90 mL of free water before and after each bolus feeding. Patient's weight has improved and was documented as 195.2 pounds, increased from 191.6 pounds on June 1 and 185 pounds May 26. Patient is tolerating a wide variety of foods and reports he is eating anything and everything.  Wife reports he is tolerating salmon, hamburgers, chicken, cheese toast, but eats small amounts.   He will occasionally drink a bottle of boost by mouth. Patient is reporting increased appetite. Labs on June 3: Glucose 130, potassium 4.4, albumin 3.0, magnesium 2.0 and phosphorus 3.1.  No signs of refeeding syndrome.  Nutrition diagnosis: Inadequate oral intake is improving.  Estimated nutrition needs: 2100-2300 cal, 105-115 g protein, 2.3 L fluid.  Intervention: Educated patient and wife to continue to increase oral intake as tolerated. Decrease Osmolite 1.5-1 carton 4 times daily with 90 mL free water before and after each bolus feeding.   Discontinue Prosource. Discontinue refeeding labs. I will continue to monitor patient and decrease tube feeding while increasing oral intake for potential discontinuation of tube feeding.  4 cartons Osmolite 1.5 provides 1420 cal, 59.6 g protein, 724 mL free water +720 mL free water flushes to total 1444 mL daily.  Monitoring, evaluation, goals: Patient will tolerate oral intake plus tube feeding to meet 90% estimated nutrition needs:  Next visit: Scheduled Wednesday, June 30, in person.  **Disclaimer: This note was dictated with voice recognition software. Similar sounding words can inadvertently be transcribed and  this note may contain transcription errors which may not have been corrected upon publication of note.**

## 2019-11-20 ENCOUNTER — Ambulatory Visit
Admission: RE | Admit: 2019-11-20 | Payer: Medicare Other | Source: Ambulatory Visit | Attending: Radiation Oncology | Admitting: Radiation Oncology

## 2019-11-22 ENCOUNTER — Ambulatory Visit: Payer: Medicare Other

## 2019-11-22 ENCOUNTER — Ambulatory Visit
Admission: RE | Admit: 2019-11-22 | Discharge: 2019-11-22 | Disposition: A | Discharge status: Home / Self Care | Payer: Medicare Other | Source: Ambulatory Visit | Attending: Radiation Oncology | Admitting: Radiation Oncology

## 2019-11-22 ENCOUNTER — Other Ambulatory Visit: Payer: Self-pay

## 2019-11-22 VITALS — BP 159/65 | HR 74 | Temp 98.1°F | Resp 19 | Wt 194.5 lb

## 2019-11-22 DIAGNOSIS — Z7982 Long term (current) use of aspirin: Secondary | ICD-10-CM | POA: Insufficient documentation

## 2019-11-22 DIAGNOSIS — C321 Malignant neoplasm of supraglottis: Secondary | ICD-10-CM | POA: Diagnosis not present

## 2019-11-22 DIAGNOSIS — Z79899 Other long term (current) drug therapy: Secondary | ICD-10-CM | POA: Diagnosis not present

## 2019-11-22 NOTE — Progress Notes (Signed)
Julian Brown presents today for follow up after completing radiation to the larynx on 10/08/2019  Pain issues, if any: Patient denies Using a feeding tube?: Yes, patient uses 4 times a day without any difficulty or issue Weight changes, if any:  Wt Readings from Last 3 Encounters:  11/22/19 194 lb 8 oz (88.2 kg)  11/18/19 195 lb 3.2 oz (88.5 kg)  11/12/19 191 lb 9.6 oz (86.9 kg)   Swallowing issues, if any: Patient denies any issues, and wife reports he has started eating a wider variety of foods. Saw Glendell Docker Schinke-SLP on 11/14/2019: " Pt presents today with WNL/WFL swallowing ability with dys III and water, using small bites and sips. Pt was placed with PEG 11-06-19 due to limited PO intake over several days and concern for meeting caloric intake. Overt s/sx aspiration were not seen today with pt spontaneously taking small sips and bites. No overt s/sx aspiraiton PNA reported or observed today." Smoking or chewing tobacco? None Using fluoride trays daily? N/A Last ENT visit was on: Not seen anyone since since Dr. Francina Ames on 07/16/19 Other notable issues, if any: Patient's voice is still hoarse, but otherwise patient denies any other concerns or issues.  Vitals:   11/22/19 1442  BP: (!) 159/65  Pulse: 74  Resp: 19  Temp: 98.1 F (36.7 C)

## 2019-11-23 ENCOUNTER — Encounter: Payer: Self-pay | Admitting: Radiation Oncology

## 2019-11-23 NOTE — Progress Notes (Signed)
Radiation Oncology         [phone] ________________________________  Name: Julian Brown MRN: [id number]  Date: 11/22/2019  DOB: [birth year]  Follow-Up Visit Note  CC: Julian Solian, MD  Julian Solian, MD  Diagnosis and Prior Radiotherapy:       ICD-10-CM   1. Malignant neoplasm of supraglottis ([place])  C32.1 NM PET Image Restag (PS) Skull Base To Thigh    CHIEF COMPLAINT:  Here for follow-up and surveillance of throat cancer  Narrative:   Julian Brown presents today for follow up after completing radiation to the larynx on 10/08/2019  He is doing better. His wife states he actually was looking forward to dinner the other day. Eating much more and using PEG to supplement.   Neuro consultation with Dr. Mickeal Skinner is pending for later this summer.  Pain issues, if any: Patient denies Using a feeding tube?: Yes, patient uses 4 times a day without any difficulty or issue Weight changes, if any:  Wt Readings from Last 3 Encounters:  11/22/19 194 lb 8 oz (88.2 kg)  11/18/19 195 lb 3.2 oz (88.5 kg)  11/12/19 191 lb 9.6 oz (86.9 kg)   Swallowing issues, if any: Patient denies any issues, and wife reports he has started eating a wider variety of foods. Saw Glendell Docker Schinke-SLP on 11/14/2019: " Pt presents today with WNL/WFL swallowing ability with dys III and water, using small bites and sips. Pt was placed with PEG 11-06-19 due to limited PO intake over several days and concern for meeting caloric intake. Overt s/sx aspiration were not seen today with pt spontaneously taking small sips and bites. No overt s/sx aspiraiton PNA reported or observed today." Smoking or chewing tobacco? None Using fluoride trays daily? N/A Last ENT visit was on: Not seen anyone since since Dr. Francina Ames on 07/16/19 Other notable issues, if any: Patient's voice is still hoarse, but otherwise patient denies any other concerns or issues.   ALLERGIES:  is allergic to penicillins.  Meds: Current  Outpatient Medications  Medication Sig Dispense Refill  . acetaminophen (TYLENOL) 500 MG tablet Take 1,000 mg by mouth every 6 (six) hours as needed for moderate pain.    Marland Kitchen amLODipine (NORVASC) 10 MG tablet Take 10 mg by mouth daily.    Marland Kitchen aspirin EC 81 MG tablet Take 81 mg by mouth daily.    Marland Kitchen BREO ELLIPTA 100-25 MCG/INH AEPB INHALE 1 PUFF INTO THE LUNGS ONCE DAILY. (Patient taking differently: Inhale 1 puff into the lungs daily. ) 60 each 5  . Cholecalciferol (VITAMIN D) 50 MCG (2000 UT) tablet Take 2,000 Units by mouth daily.    Marland Kitchen docusate sodium (COLACE) 100 MG capsule Take 2 capsules (200 mg total) by mouth daily. Take while on Hycet to prevent constipation. (Patient not taking: Reported on 10/31/2019) 60 capsule 1  . fluticasone (FLONASE) 50 MCG/ACT nasal spray Place 1 spray into both nostrils daily as needed for allergies.     Marland Kitchen gabapentin (NEURONTIN) 300 MG capsule Take 1 capsule (300 mg total) by mouth 3 (three) times daily. (Patient not taking: Reported on 10/31/2019) 90 capsule 1  . HYDROcodone-acetaminophen (HYCET) 7.5-325 mg/15 ml solution Take 15 mLs by mouth 3 (three) times daily as needed for moderate pain. Take with food. (Patient not taking: Reported on 10/31/2019) 473 mL 0  . lidocaine (XYLOCAINE) 2 % solution Patient: Mix 1part 2% viscous lidocaine, 1part H20. Swish & swallow 29m of diluted mixture, 340m before meals and at bedtime, up to QID (  Patient taking differently: Use as directed 15 mLs in the mouth or throat See admin instructions. Patient: Mix 1part 2% viscous lidocaine, 1part H20. Swish & swallow 43m of diluted mixture, 331m before meals and at bedtime as needed mouth/throat pain) 200 mL 3  . Multiple Vitamins-Minerals (MULTIVITAMIN WITH MINERALS) tablet Take 1 tablet by mouth daily. (Patient not taking: Reported on 10/31/2019) 10 tablet 1  . Nutritional Supplements (FEEDING SUPPLEMENT, OSMOLITE 1.5 CAL,) LIQD Give 1.5 cartons of formula at 8am, noon, 4pm and 8pm.  Flush  with 90104mf water before and after feeding. Will need to drink additional 240m88mter or give via tube to meet hydration needs.  Meets 100% of estimated needs.  Send bolus supplies. 1422 mL 0  . Nutritional Supplements (PROSOURCE TF) LIQD Give 30ml72m via feeding tube.  Mix with 30-60ml 39mater with prosource.  Flush tube with 30-60 ml of water first, then give prosource and water mixture and flush with 30-60ml a99m. 60 mL 10  . olmesartan-hydrochlorothiazide (BENICAR HCT) 40-12.5 MG tablet Take 1 tablet by mouth daily.     . PolyeVladimir Faster-Propyl Glycol (SYSTANE OP) Place 1 drop into both eyes daily as needed (dry eyes).    . potassium & sodium phosphates (SODIUM, POTASSIUM, PHOSPHORUS) 280-160-250 MG PACK Dissolve in approx. 1/3 cup of water four times a day, take after meals and at bedtime. 120 each 0  . rosuvastatin (CRESTOR) 10 MG tablet Take 10 mg by mouth daily.    . sodium fluoride (PREVIDENT 5000 PLUS) 1.1 % CREA dental cream Apply to tooth brush. Brush teeth for 2 minutes. Spit out excess-DO NOT swallow. Repeat nightly. (Patient taking differently: Place 1 application onto teeth at bedtime. Apply to tooth brush. Brush teeth for 2 minutes. Spit out excess-DO NOT swallow. Repeat nightly.) 1 Tube prn  . SPIRIVA RESPIMAT 1.25 MCG/ACT AERS USE 2 PUFFS EACH DAY (Patient taking differently: Inhale 2 puffs into the lungs daily. ) 4 g 5  . thiamine 100 MG tablet Take 1 tablet (100 mg total) by mouth daily. (Patient not taking: Reported on 10/31/2019) 10 tablet 1  . Wound Dressings (SONAFINE EX) Apply 1 application topically daily.     No current facility-administered medications for this encounter.    Physical Findings:   Wt Readings from Last 3 Encounters:  11/22/19 194 lb 8 oz (88.2 kg)  11/18/19 195 lb 3.2 oz (88.5 kg)  11/12/19 191 lb 9.6 oz (86.9 kg)    weight is 194 lb 8 oz (88.2 kg). His oral temperature is 98.1 F (36.7 C). His blood pressure is 159/65 (abnormal) and his pulse is  74. His respiration is 19. .  General: The patient is very quiet and does not provide much of a history.  He is nontoxic-appearing.  He is ambulatory.  He is in no acute distress, No oral or upper throat lesions. Skin intact and well healed over neck.  Lab Findings: Lab Results  Component Value Date   WBC 6.3 11/06/2019   HGB 14.6 11/06/2019   HCT 45.0 11/06/2019   MCV 101.4 (H) 11/06/2019   PLT 310 11/06/2019    Lab Results  Component Value Date   TSH 0.494 08/13/2019    Radiographic Findings: IR GASTROSTOMY TUBE MOD SED  Result Date: 11/06/2019 CLINICAL DATA:  Supraglottic laryngeal carcinoma and poor oral intake after radiation therapy. The patient requires a gastrostomy tube for nutritional needs. EXAM: PERCUTANEOUS GASTROSTOMY TUBE PLACEMENT ANESTHESIA/SEDATION: 2.0 mg IV Versed; 50 mcg IV Fentanyl. Total  Moderate Sedation Time 10 minutes. The patient's level of consciousness and physiologic status were continuously monitored during the procedure by Radiology nursing. CONTRAST:  72m OMNIPAQUE IOHEXOL 300 MG/ML  SOLN MEDICATIONS: 1 g IV vancomycin. IV antibiotic was administered in an appropriate time interval prior to needle puncture of the skin. FLUOROSCOPY TIME:  2 minutes and 36 seconds.  15.4 mGy. PROCEDURE: The procedure, risks, benefits, and alternatives were explained to the patient. Questions regarding the procedure were encouraged and answered. The patient understands and consents to the procedure. The evening prior to the procedure, the patient was given thin liquid barium to ingest in order to opacify the colon. A 5-French catheter was then advanced through the patient's mouth under fluoroscopy into the esophagus and to the level of the stomach. This catheter was used to insufflate the stomach with air under fluoroscopy. The abdominal wall was prepped with chlorhexidine in a sterile fashion, and a sterile drape was applied covering the operative field. A sterile gown and sterile  gloves were used for the procedure. Local anesthesia was provided with 1% Lidocaine. A skin incision was made in the upper abdominal wall. Under fluoroscopy, an 18 gauge trocar needle was advanced into the stomach. Contrast injection was performed to confirm intraluminal position of the needle tip. A single T tack was then deployed in the lumen of the stomach. This was brought up to tension at the skin surface. Over a guidewire, a 9-French sheath was advanced into the lumen of the stomach. The wire was left in place as a safety wire. A loop snare device from a percutaneous gastrostomy kit was then advanced into the stomach. A floppy guide wire was advanced through the orogastric catheter under fluoroscopy in the stomach. The loop snare advanced through the percutaneous gastric access was used to snare the guide wire. This allowed withdrawal of the loop snare out of the patient's mouth by retraction of the orogastric catheter and wire. A 20-French bumper retention gastrostomy tube was looped around the snare device. It was then pulled back through the patient's mouth. The retention bumper was brought up to the anterior gastric wall. The T tack suture was cut at the skin. The exiting gastrostomy tube was cut to appropriate length and a feeding adapter applied. The catheter was injected with contrast material to confirm position and a fluoroscopic spot image saved. The tube was then flushed with saline. A dressing was applied over the gastrostomy exit site. COMPLICATIONS: None. FINDINGS: Initial fluoroscopy demonstrates adequate opacification of the colon by ingested barium in order to prevent colonic injury during the procedure. The stomach distended well with air allowing safe placement of the gastrostomy tube. After placement, the tip of the gastrostomy tube lies in the body of the stomach. IMPRESSION: Percutaneous gastrostomy with placement of a 20-French bumper retention tube in the body of the stomach. This tube  can be used for percutaneous feeds beginning in 24 hours after placement. Electronically Signed   By: GAletta EdouardM.D.   On: 11/06/2019 11:35    Impression/Plan:   Throat cancer. Cancer Staging Malignant neoplasm of supraglottis ([facility] Staging form: Larynx - Supraglottis, AJCC 8th Edition - Clinical stage from 08/06/2019: Stage III (cT3, cN0, cM0) - Signed by SEppie Gibson MD on 08/08/2019   Nutrition: much improved. Continue PEG and PO intake as tolerated.  Swallowing: much improved.  Continue SLP  Neurocognitive decline: pending consult with Dr VMickeal Skinner Will restage with PET scan around early August and see him for f/u a  day after scan. He and his wife are pleased with this plan.    On date of service, in total, I spent 20 minutes on this encounter. Patient seen in person. _____________________________________   Eppie Gibson, MD

## 2019-12-03 ENCOUNTER — Ambulatory Visit: Payer: Medicare Other | Admitting: Internal Medicine

## 2019-12-04 NOTE — Progress Notes (Signed)
  Patient Name: Julian Brown MRN: [id number] DOB: 11/05/1932 Referring Physician: Francina Ames (Profile Not Attached) Date of Service: 10/08/2019 [facility] [place], [place]                                                        End Of Treatment Note  Diagnoses: C32.0-Malignant neoplasm of glottis  Cancer Staging:  Cancer Staging Malignant neoplasm of supraglottis [facility]) Staging form: Larynx - Supraglottis, AJCC 8th Edition - Clinical stage from 08/06/2019: Stage III (cT3, cN0, cM0) - Signed by Eppie Gibson, MD on 08/08/2019   Intent: Curative  Radiation Treatment Dates: 08/21/2019 through 10/08/2019  Site Technique Total Dose (Gy) Dose per Fx (Gy) Completed Fx Beam Energies  Larynx: HN_Larynx IMRT 70/70 2 35/35 6X   Narrative: The patient tolerated radiation therapy to his neck and throat relatively well.   Plan: The patient will follow-up with radiation oncology in 2 weeks .    Eppie Gibson, MD

## 2019-12-11 ENCOUNTER — Other Ambulatory Visit: Payer: Self-pay

## 2019-12-11 ENCOUNTER — Ambulatory Visit (HOSPITAL_COMMUNITY): Payer: Medicare Other | Admitting: Dentistry

## 2019-12-11 ENCOUNTER — Encounter (HOSPITAL_COMMUNITY): Payer: Self-pay | Admitting: Dentistry

## 2019-12-11 ENCOUNTER — Inpatient Hospital Stay: Payer: Medicare Other | Admitting: Nutrition

## 2019-12-11 VITALS — BP 171/68 | HR 67 | Temp 98.6°F | Wt 191.0 lb

## 2019-12-11 DIAGNOSIS — Z923 Personal history of irradiation: Secondary | ICD-10-CM | POA: Diagnosis not present

## 2019-12-11 DIAGNOSIS — K0850 Unsatisfactory restoration of tooth, unspecified: Secondary | ICD-10-CM

## 2019-12-11 DIAGNOSIS — K0889 Other specified disorders of teeth and supporting structures: Secondary | ICD-10-CM

## 2019-12-11 DIAGNOSIS — C321 Malignant neoplasm of supraglottis: Secondary | ICD-10-CM | POA: Diagnosis not present

## 2019-12-11 NOTE — Patient Instructions (Signed)
RECOMMENDATIONS: 1. Brush after meals and at bedtime.  Use fluoride at bedtime. 2. Use trismus exercises as directed. 3. Use Biotene Rinse or salt water/baking soda rinses. 4. Multiple sips of water as needed. 5. Return to Dr. Daneen Schick for exam and cleaning in approximately 1 to 2 months.    Lenn Cal, DDS

## 2019-12-11 NOTE — Progress Notes (Signed)
Nutrition follow-up completed with patient who is status post radiation therapy for supraglottis cancer on April 27. Weight today was improved and documented as 198 pounds.  Patient weighed in my office.  This is increased from 194.5 pounds on June 11. Patient denies nutrition impact symptoms. He has been using 4 cartons Osmolite 1.5 every day without difficulty. He is eating a variety of food textures such as egg and cheese croissants, crab cakes and salmon cakes and roast beef sandwiches with fries. He would like to have his feeding tube removed.  Nutrition diagnosis: Inadequate oral intake is improving.  Intervention: Educated patient to decrease Osmolite 1.5-2 cartons daily with 90 mL of free water before and after each bolus feeding. I have encouraged patient to increase oral intake and consume something that is high-calorie and high-protein 3 meals daily. He is agreeable to come in in 2 weeks for a weight check.  Monitoring, evaluation, goals: Patient will tolerate a decrease in tube feeding while increasing oral intake to promote weight maintenance.  Next visit: July 14.  **Disclaimer: This note was dictated with voice recognition software. Similar sounding words can inadvertently be transcribed and this note may contain transcription errors which may not have been corrected upon publication of note.**

## 2019-12-11 NOTE — Progress Notes (Signed)
12/11/2019  Patient Name:   Julian Brown Date of Birth:   10-13-1932 Medical Record Number: [id number]  COVID 19 SCREENING: The patient does not symptoms concerning for COVID-19 infection (Including fever, chills, cough, or new SHORTNESS OF BREATH).  Patient has had Covid vaccination.  BP (!) 171/68 (BP Location: Right Arm)   Pulse 67   Temp 98.6 F (37 C)   Wt 191 lb (86.6 kg)   BMI 26.64 kg/m   [name] presents for oral examination after radiation therapy. Patient has completed all radiation treatments from 08/21/2019 through 10/08/2019. No chemotherapy.  REVIEW OF CHIEF COMPLAINTS:  DRY MOUTH: Yes HARD TO SWALLOW: Yes  HURT TO SWALLOW: No TASTE CHANGES: Taste has almost completely returned. SORES IN MOUTH: None TRISMUS: None WEIGHT: 191 pounds down from 217 pounds.  HOME OH REGIMEN:  BRUSHING: Once a day.  Encouraged to brush after meals and at bedtime and at least twice a day. FLOSSING: No. RINSING: Using salt water and baking soda rinses as well as Biotene rinses FLUORIDE: Stopped using fluoride but was told to restart using fluoride daily for the rest of his life. TRISMUS EXERCISES:  Maximum interincisal opening: 4mm   DENTAL EXAM:  Oral Hygiene:(PLAQUE): Good oral hygiene.   LOCATION OF MUCOSITIS: None noted DESCRIPTION OF SALIVA: Decreased saliva. Mild xerostomia. ANY EXPOSED BONE: None noted OTHER WATCHED AREAS: Previous suboptimal dental restorations placed.  Patient has clinically ill fitting partial dentures but patient denies having problems with them. DX: Xerostomia, Dysgeusia, Dysphagia and Accretions  RECOMMENDATIONS: 1. Brush after meals and at bedtime.  Use fluoride at bedtime. 2. Use trismus exercises as directed. 3. Use Biotene Rinse or salt water/baking soda rinses. 4. Multiple sips of water as needed. 5. Return to Dr. Daneen Schick for exam and cleaning in approximately 1 to 2 months.     Lenn Cal, DDS

## 2019-12-25 ENCOUNTER — Inpatient Hospital Stay: Payer: Medicare Other | Attending: Internal Medicine | Admitting: Nutrition

## 2019-12-25 ENCOUNTER — Other Ambulatory Visit: Payer: Self-pay

## 2019-12-25 NOTE — Progress Notes (Signed)
Nutrition follow-up completed with patient who is status post radiation therapy for supraglottis cancer on April 27. Weight was documented as 194.4 pounds.  This is decreased from 198 pounds June 30 and stable from 194.5 pounds June 11. Patient denies nutrition impact symptoms. He has been infusing between 1 and 2 bottles of Osmolite 1.5 every day without difficulty. He continues to eat a wide variety of textures and types of food. He would like to have his feeding tube removed.  Nutrition diagnosis: Inadequate oral intake improved.  Intervention: I educated patient to discontinue Osmolite 1.5. Educated patient to begin Ensure Plus or boost plus twice daily by mouth between meals. Encouraged increased fluid intake. Stressed importance of weight maintenance in order to be considered for tube removal. Patient verbalizes understanding.  Monitoring, evaluation, goals: Patient will tolerate oral intake without tube feeding or weight maintenance.  Next visit: Wednesday, August 4 by telephone.  **Disclaimer: This note was dictated with voice recognition software. Similar sounding words can inadvertently be transcribed and this note may contain transcription errors which may not have been corrected upon publication of note.**

## 2019-12-31 ENCOUNTER — Telehealth: Payer: Self-pay | Admitting: *Deleted

## 2019-12-31 ENCOUNTER — Inpatient Hospital Stay: Payer: Medicare Other | Admitting: Internal Medicine

## 2019-12-31 NOTE — Telephone Encounter (Signed)
CALLED PATIENT TO INFORM OF PET SCAN FOR 01-13-20 - ARRIVAL TIME- 9:30 AM @ [facility], PATIENT TO HAVE WATER ONLY- AFTER MIDNIGHT, PATIENT TO RECEIVE RESULTS FROM DR. SQUIRE ON 01-14-20 @ 2:20 PM, SPOKE WITH PATIENT'S WIFE- Julian Brown AND SHE IS AWARE OF THESE APPTS.

## 2020-01-13 ENCOUNTER — Encounter (HOSPITAL_COMMUNITY)
Admission: RE | Admit: 2020-01-13 | Discharge: 2020-01-13 | Disposition: A | Discharge status: Home / Self Care | Payer: Medicare Other | Source: Ambulatory Visit | Attending: Radiation Oncology | Admitting: Radiation Oncology

## 2020-01-13 ENCOUNTER — Other Ambulatory Visit: Payer: Self-pay

## 2020-01-13 DIAGNOSIS — C321 Malignant neoplasm of supraglottis: Secondary | ICD-10-CM | POA: Diagnosis not present

## 2020-01-13 LAB — GLUCOSE, CAPILLARY: Glucose-Capillary: 108 mg/dL — ABNORMAL HIGH (ref 70–99)

## 2020-01-13 MED ORDER — FLUDEOXYGLUCOSE F - 18 (FDG) INJECTION
9.4000 | Freq: Once | INTRAVENOUS | Status: AC | PRN
  Administered 2020-01-13: 9.4 via INTRAVENOUS

## 2020-01-14 ENCOUNTER — Telehealth: Payer: Self-pay | Admitting: Radiation Oncology

## 2020-01-14 ENCOUNTER — Other Ambulatory Visit: Payer: Self-pay

## 2020-01-14 ENCOUNTER — Ambulatory Visit
Admission: RE | Admit: 2020-01-14 | Discharge: 2020-01-14 | Disposition: A | Discharge status: Home / Self Care | Payer: Medicare Other | Source: Ambulatory Visit | Attending: Radiation Oncology | Admitting: Radiation Oncology

## 2020-01-14 VITALS — BP 146/55 | HR 76 | Temp 98.5°F | Resp 20 | Ht 71.0 in | Wt 193.4 lb

## 2020-01-14 DIAGNOSIS — I6523 Occlusion and stenosis of bilateral carotid arteries: Secondary | ICD-10-CM | POA: Insufficient documentation

## 2020-01-14 DIAGNOSIS — R490 Dysphonia: Secondary | ICD-10-CM | POA: Insufficient documentation

## 2020-01-14 DIAGNOSIS — K7689 Other specified diseases of liver: Secondary | ICD-10-CM | POA: Insufficient documentation

## 2020-01-14 DIAGNOSIS — M5136 Other intervertebral disc degeneration, lumbar region: Secondary | ICD-10-CM | POA: Diagnosis not present

## 2020-01-14 DIAGNOSIS — Z79899 Other long term (current) drug therapy: Secondary | ICD-10-CM | POA: Insufficient documentation

## 2020-01-14 DIAGNOSIS — C321 Malignant neoplasm of supraglottis: Secondary | ICD-10-CM | POA: Diagnosis not present

## 2020-01-14 DIAGNOSIS — I700 Atherosclerosis of aorta: Secondary | ICD-10-CM | POA: Diagnosis not present

## 2020-01-14 DIAGNOSIS — Z7982 Long term (current) use of aspirin: Secondary | ICD-10-CM | POA: Diagnosis not present

## 2020-01-14 DIAGNOSIS — J439 Emphysema, unspecified: Secondary | ICD-10-CM | POA: Insufficient documentation

## 2020-01-14 NOTE — Progress Notes (Signed)
Oncology Nurse Navigator Documentation  I met with patient and his wife during his follow up appointment with Dr. Isidore Moos today. He received his PET results. At the request of Dr. Isidore Moos I have placed an order for PT, PEG removal, and will contact Dr. Servando Salina office for follow up in 3-4 weeks for laryngoscopy. They are pleased with this plan and will see Dr. Isidore Moos the end of November next. They know to call me if they have any further questions or concerns.   [name] RN, BSN, OCN Head & Neck Oncology Nurse [place] at [facility] Phone # [phone]  Fax # [phone]

## 2020-01-14 NOTE — Progress Notes (Signed)
Mr. Varma presents todayfor follow up after completing radiation to the larynx on 10/08/2019 and to review PET scan from 01/13/2020  Pain issues, if any: Patient denies Using a feeding tube?: No--he is interested in having removed Weight changes, if any:  Wt Readings from Last 3 Encounters:  01/14/20 193 lb 6.4 oz (87.7 kg)  01/13/20 185 lb (83.9 kg)  12/25/19 194 lb 6.4 oz (88.2 kg)   Swallowing issues, if any: Patient denies. He says he is able to eat/drink whatever his wife prepares for him Smoking or chewing tobacco? None Using fluoride trays daily? Using fluoride toothpaste per Dr. Teena Dunk 12/11/2019 Last ENT visit was on: Has not seen anyone since since Dr. Francina Ames on 07/16/19 Other notable issues, if any: Patient denies any lyphemeda. Denies any ear/jaw pain. Still has vocal hoarseness and thick secretions, but otherwise feels he is doing much better since completing treatment.  Vitals:   01/14/20 1433  BP: (!) 146/55  Pulse: 76  Resp: 20  Temp: 98.5 F (36.9 C)  SpO2: 94%

## 2020-01-15 ENCOUNTER — Inpatient Hospital Stay: Payer: Medicare Other | Attending: Internal Medicine | Admitting: Nutrition

## 2020-01-15 NOTE — Progress Notes (Signed)
Oncology Nurse Navigator Documentation  I called and left a voice mail with Julian Brown and his wife informing them of his appointment with Dr. Nicolette Bang on 8/31 at 12:30. I left my direct number if they had any further questions or concerns.   [name] RN, BSN, OCN Head & Neck Oncology Nurse [place] at [facility] Phone # [phone]  Fax # [phone]

## 2020-01-15 NOTE — Progress Notes (Signed)
Nutrition follow up completed by telephone. Patient is scheduled for PEG removal. He has been eating well and has maintained weight. Weight documented as 193 pounds.  He continues to have thickened saliva.  Reminded patient to use baking soda and salt water rinses, try pineapple juice or papaya nectar, and increase fluids.  Patient appreciative of nutrition interventions throughout treatment.  I have encouraged him to contact me if they develop concerns or questions.

## 2020-01-16 ENCOUNTER — Other Ambulatory Visit: Payer: Self-pay

## 2020-01-16 ENCOUNTER — Ambulatory Visit: Payer: Medicare Other | Attending: Radiation Oncology

## 2020-01-16 DIAGNOSIS — Z9181 History of falling: Secondary | ICD-10-CM | POA: Insufficient documentation

## 2020-01-16 DIAGNOSIS — R293 Abnormal posture: Secondary | ICD-10-CM | POA: Insufficient documentation

## 2020-01-16 DIAGNOSIS — M6281 Muscle weakness (generalized): Secondary | ICD-10-CM | POA: Insufficient documentation

## 2020-01-16 DIAGNOSIS — I890 Lymphedema, not elsewhere classified: Secondary | ICD-10-CM | POA: Diagnosis present

## 2020-01-16 DIAGNOSIS — R1310 Dysphagia, unspecified: Secondary | ICD-10-CM | POA: Diagnosis not present

## 2020-01-16 NOTE — Therapy (Signed)
[facility] [address] [place], [place], [address] Phone: [phone]   Fax:  [phone]  Speech Language Pathology Treatment  Patient Details  Name: Julian Brown MRN: [id number] Date of Birth: 07/05/1932 Referring Provider (SLP): Eppie Gibson, MD   Encounter Date: 01/16/2020   End of Session - 01/16/20 1510    Visit Number 5    Number of Visits 6    Date for SLP Re-Evaluation 01/31/20    SLP Start Time 1448    SLP Stop Time  1519    SLP Time Calculation (min) 31 min    Activity Tolerance Patient tolerated treatment well           Past Medical History:  Diagnosis Date  . Acute gangrenous cholecystitis s/p lap cholecystectomy 06/08/2017 06/08/2017  . Arthritis   . Colon cancer ([place])   . ED (erectile dysfunction)   . Emphysema of lung ([place])   . Hyperlipemia   . Hypertension   . Prostate cancer [facility])    prostate cancer-tx radiation  . Wears glasses     Past Surgical History:  Procedure Laterality Date  . APPENDECTOMY    . CARPAL TUNNEL RELEASE  5/12   lt  . CARPAL TUNNEL RELEASE  12/13/2011   Procedure: CARPAL TUNNEL RELEASE;  Surgeon: [name]., MD;  Location: [facility];  Service: Orthopedics;  Laterality: Right;  and inject right wrist  . CHOLECYSTECTOMY N/A 06/08/2017   Procedure: LAPAROSCOPIC CHOLECYSTECTOMY WITH LYSIS OF ADHESIONS;  Surgeon: [name], MD;  Location: [facility];  Service: General;  Laterality: N/A;  . [place]   hemicolectomy-rt-ca  . COLONOSCOPY     about 12 inches of colon removed due to colon cancer  . IR GASTROSTOMY TUBE MOD SED  11/06/2019    There were no vitals filed for this visit.   Subjective Assessment - 01/16/20 1450    Subjective Pt waiting for appointment for removal of PEG.    Currently in Pain? No/denies                 ADULT SLP TREATMENT - 01/16/20 0001      General Information   Behavior/Cognition  Alert;Cooperative;Requires cueing;Distractible      Treatment Provided   Treatment provided Dysphagia      Dysphagia Treatment   Temperature Spikes Noted No    Respiratory Status Room air    Oral Cavity - Dentition Missing dentition    Patient observed directly with PO's Yes    Type of PO's observed Dysphagia 3 (soft);Thin liquids    Liquids provided via Cup    Oral Phase Signs & Symptoms --   none noted   Pharyngeal Phase Signs & Symptoms --   none observed nor reported today   Other treatment/comments Pt with WNL/WFL swallowing for dys III/thin today. Pt req'd min A rarely with HEP procedure. Is again doing HEP once/day - SLP told pt and wife for best chance of WNL swallowing to incr to BID. They voiced understanding. Pt told SLP when to decr from BID to QD - THANKSGIVING. Wife with questions re: pt's voice - SLP referred her to pt's ENT.       Assessment / Recommendations / Plan   Plan Continue with current plan of care            SLP Education - 01/16/20 1530    Education Details Needs HEP done BID    Person(s) Educated Patient;Spouse  Methods Explanation    Comprehension Verbalized understanding            SLP Short Term Goals - 10/17/19 1222      SLP SHORT TERM GOAL #1   Title pt will perform HEP with occasional min A    Status Partially Met      SLP SHORT TERM GOAL #2   Title pt will tell SLP rationale for HEP completion with modified independence    Period --   sessions   Status Partially Met      SLP SHORT TERM GOAL #3   Title pt and/or wife witll tell SLP 3 overt s/s aspiration PNA    Period --   sessions   Status Partially Met            SLP Long Term Goals - 01/16/20 1510      SLP LONG TERM GOAL #1   Title pt and/or wife will state how a food journal can foster hastened return to most normalized diet    Status Deferred      SLP LONG TERM GOAL #2   Title pt will complete HEP for swallowing with rare min A over two sessions    Baseline 11-14-19,  01-16-20    Time --    Period --    Status Achieved      SLP LONG TERM GOAL #3   Title pt will tell SLP when he can decr frequency of HEP after radiation,with modified independence, over two sessions    Baseline 01-16-20    Time 2    Period --   sessions   Status On-going            Plan - 01/16/20 1525    Clinical Impression Statement Pt presents today with WNL/WFL swallowing ability with dys III and water. Overt s/sx aspiration were not seen today. No overt s/sx aspiraiton PNA reported or observed today. Pt is now waiting for his PEG tube to be removed - appointment to be made in next 1-2 weeks. See "other comments" for more details. Data suggests that as pts progress through rad therapy that their swallowing ability will decrease. Also, WNL swallowing is threatened by muscle fibrosis that will likely develop after rad is completed. Skilled ST would be beneficial to the pt in order to regularly assess pt's safety with POs and/or need for instrumental swallow assessment, as well as to assess proper completion of HEP.    Speech Therapy Frequency --   once approx every four weeks   Duration --   3 visits   Treatment/Interventions Aspiration precaution training;Pharyngeal strengthening exercises;Diet toleration management by SLP;Trials of upgraded texture/liquids;Patient/family education;Internal/external aids;SLP instruction and feedback;Compensatory techniques    Potential to Achieve Goals Good    Potential Considerations Ability to learn/carryover information   pt observed slower processing, decr'd memory (looked to wife for questions re: medical history, pt could not recall >2 details from yesterday's visit to cancer center)          Patient will benefit from skilled therapeutic intervention in order to improve the following deficits and impairments:   Dysphagia, unspecified type    Problem List Patient Active Problem List   Diagnosis Date Noted  . Malignant neoplasm of supraglottis  ([place]) 08/08/2019  . Hoarseness of voice 06/12/2019  . Cough 05/23/2019  . Primary osteoarthritis of right wrist 07/31/2018  . Stage 2 moderate COPD by GOLD classification ([place]) 05/16/2018  . Allergic rhinitis 05/16/2018  . Nocturnal hypoxemia 05/16/2018  .  Abnormal finding on lung imaging 05/16/2018  . Hypertension   . Hyperlipemia   . Acute gangrenous cholecystitis s/p lap cholecystectomy 06/08/2017 06/08/2017  . Pain in right wrist 01/04/2017  . Chronic pain of left knee 01/04/2017  . Chronic pain of right knee 01/04/2017  . Unilateral primary osteoarthritis, left knee 01/04/2017  . Unilateral primary osteoarthritis, right knee 01/04/2017    [facility] ,MS, CCC-SLP  01/16/2020, 3:31 PM  [facility] [address] [place], [place], [address] Phone: [phone]   Fax:  [phone]   Name: KATHRYN LINAREZ MRN: [id number] Date of Birth: Jul 09, 1932

## 2020-01-17 ENCOUNTER — Encounter: Payer: Self-pay | Admitting: Radiation Oncology

## 2020-01-17 ENCOUNTER — Other Ambulatory Visit: Payer: Self-pay

## 2020-01-17 DIAGNOSIS — Z1329 Encounter for screening for other suspected endocrine disorder: Secondary | ICD-10-CM

## 2020-01-17 NOTE — Progress Notes (Addendum)
Radiation Oncology         [phone] ________________________________  Name: Julian Brown MRN: [id number]  Date: 01/14/2020  DOB: 07-May-1933  Follow-Up Visit Note  CC: Prince Solian, MD  Francina Ames, MD  Diagnosis and Prior Radiotherapy:       ICD-10-CM   1. Malignant neoplasm of supraglottis ([facility])  C32.1       Cancer Staging:  Cancer Staging Malignant neoplasm of supraglottis [facility]) Staging form: Larynx - Supraglottis, AJCC 8th Edition - Clinical stage from 08/06/2019: Stage III (cT3, cN0, cM0) - Signed by Eppie Gibson, MD on 08/08/2019  Radiation Treatment Dates: 08/21/2019 through 10/08/2019  Site Technique Total Dose (Gy) Dose per Fx (Gy) Completed Fx Beam Energies  Larynx: HN_Larynx IMRT 70/70 2 35/35 6X     CHIEF COMPLAINT:  Here for follow-up and surveillance of throat cancer. He initially a difficult recovery after completing radiotherapy due to taste changes and poor p.o. intake. PEG tube was placed and nutrition improved significantly. Now he is back to a complete oral diet and would like to have his feeding tube removed. Weight has stabilized  He still has some hoarseness. He and his wife feel that he is doing much better overall. He is using fluoride toothpaste per dental recommendations. He has not seen otolaryngology since February. He denies any ear or jaw pain. He still has thick secretions. He denies smoking or chewing tobacco. No significant dysphagia with normal foods.    Narrative:     ALLERGIES:  is allergic to penicillins.  Meds: Current Outpatient Medications  Medication Sig Dispense Refill  . acetaminophen (TYLENOL) 500 MG tablet Take 1,000 mg by mouth every 6 (six) hours as needed for moderate pain.    Marland Kitchen amLODipine (NORVASC) 10 MG tablet Take 10 mg by mouth daily.    Marland Kitchen aspirin EC 81 MG tablet Take 81 mg by mouth daily.    Marland Kitchen BREO ELLIPTA 100-25 MCG/INH AEPB INHALE 1 PUFF INTO THE LUNGS ONCE DAILY. (Patient taking differently: Inhale 1 puff  into the lungs daily. ) 60 each 5  . Cholecalciferol (VITAMIN D) 50 MCG (2000 UT) tablet Take 2,000 Units by mouth daily.    Marland Kitchen docusate sodium (COLACE) 100 MG capsule Take 2 capsules (200 mg total) by mouth daily. Take while on Hycet to prevent constipation. (Patient not taking: Reported on 10/31/2019) 60 capsule 1  . fluticasone (FLONASE) 50 MCG/ACT nasal spray Place 1 spray into both nostrils daily as needed for allergies.     Marland Kitchen gabapentin (NEURONTIN) 300 MG capsule Take 1 capsule (300 mg total) by mouth 3 (three) times daily. (Patient not taking: Reported on 10/31/2019) 90 capsule 1  . HYDROcodone-acetaminophen (HYCET) 7.5-325 mg/15 ml solution Take 15 mLs by mouth 3 (three) times daily as needed for moderate pain. Take with food. (Patient not taking: Reported on 10/31/2019) 473 mL 0  . lidocaine (XYLOCAINE) 2 % solution Patient: Mix 1part 2% viscous lidocaine, 1part H20. Swish & swallow 32m of diluted mixture, 339m before meals and at bedtime, up to QID (Patient taking differently: Use as directed 15 mLs in the mouth or throat See admin instructions. Patient: Mix 1part 2% viscous lidocaine, 1part H20. Swish & swallow 1037mf diluted mixture, 73m16mefore meals and at bedtime as needed mouth/throat pain) 200 mL 3  . Multiple Vitamins-Minerals (MULTIVITAMIN WITH MINERALS) tablet Take 1 tablet by mouth daily. (Patient not taking: Reported on 10/31/2019) 10 tablet 1  . Nutritional Supplements (FEEDING SUPPLEMENT, OSMOLITE 1.5 CAL,) LIQD Give  1.5 cartons of formula at 8am, noon, 4pm and 8pm.  Flush with 90m of water before and after feeding. Will need to drink additional 2475mwater or give via tube to meet hydration needs.  Meets 100% of estimated needs.  Send bolus supplies. 1422 mL 0  . Nutritional Supplements (PROSOURCE TF) LIQD Give 3073mID via feeding tube.  Mix with 30-65m61m water with prosource.  Flush tube with 30-60 ml of water first, then give prosource and water mixture and flush with 30-65ml86mter. 60 mL 10  . olmesartan-hydrochlorothiazide (BENICAR HCT) 40-12.5 MG tablet Take 1 tablet by mouth daily.     . PolVladimir Fasterol-Propyl Glycol (SYSTANE OP) Place 1 drop into both eyes daily as needed (dry eyes).    . potassium & sodium phosphates (SODIUM, POTASSIUM, PHOSPHORUS) 280-160-250 MG PACK Dissolve in approx. 1/3 cup of water four times a day, take after meals and at bedtime. 120 each 0  . rosuvastatin (CRESTOR) 10 MG tablet Take 10 mg by mouth daily.    . sodium fluoride (PREVIDENT 5000 PLUS) 1.1 % CREA dental cream Apply to tooth brush. Brush teeth for 2 minutes. Spit out excess-DO NOT swallow. Repeat nightly. (Patient taking differently: Place 1 application onto teeth at bedtime. Apply to tooth brush. Brush teeth for 2 minutes. Spit out excess-DO NOT swallow. Repeat nightly.) 1 Tube prn  . SPIRIVA RESPIMAT 1.25 MCG/ACT AERS USE 2 PUFFS EACH DAY (Patient taking differently: Inhale 2 puffs into the lungs daily. ) 4 g 5  . thiamine 100 MG tablet Take 1 tablet (100 mg total) by mouth daily. (Patient not taking: Reported on 10/31/2019) 10 tablet 1  . Wound Dressings (SONAFINE EX) Apply 1 application topically daily.     No current facility-administered medications for this encounter.    Physical Findings:   Wt Readings from Last 3 Encounters:  01/14/20 193 lb 6.4 oz (87.7 kg)  01/13/20 185 lb (83.9 kg)  12/25/19 194 lb 6.4 oz (88.2 kg)    height is 5' 11"  (1.803 m) and weight is 193 lb 6.4 oz (87.7 kg). His temperature is 98.5 F (36.9 C). His blood pressure is 146/55 (abnormal) and his pulse is 76. His respiration is 20 and oxygen saturation is 94%. .  General: The patient seems more like himself again. He is more interactive and engaged.  He is nontoxic-appearing.  He is ambulatory.  He is in no acute distress  HEENT: No oral or upper throat lesions. Skin intact and well healed over neck. He still has some hoarseness.  Neck: No palpable adenopathy  Abd: PEG intact  Lab  Findings: Lab Results  Component Value Date   WBC 6.3 11/06/2019   HGB 14.6 11/06/2019   HCT 45.0 11/06/2019   MCV 101.4 (H) 11/06/2019   PLT 310 11/06/2019    Lab Results  Component Value Date   TSH 0.494 08/13/2019    Radiographic Findings: NM PET Image Restag (PS) Skull Base To Thigh  Result Date: 01/13/2020 CLINICAL DATA:  Subsequent treatment strategy for head and neck cancer (supraglottic carcinoma diagnosed in December 2020). EXAM: NUCLEAR MEDICINE PET SKULL BASE TO THIGH TECHNIQUE: 9.4 mCi F-18 FDG was injected intravenously. Full-ring PET imaging was performed from the skull base to thigh after the radiotracer. CT data was obtained and used for attenuation correction and anatomic localization. Fasting blood glucose: 108 mg/dl COMPARISON:  CT neck 06/05/2019, chest CT 04/16/2019 and abdominopelvic CT 06/08/2017. FINDINGS: Mediastinal blood pool activity: SUV max 2.5 NECK: No  hypermetabolic cervical lymph nodes are identified.No discrete hypermetabolic lesions of the pharyngeal mucosal space are identified. There is mildly asymmetric activity posterior to the arytenoid cartilage on the right (SUV max 4.9) without apparent focal soft tissue abnormality on the CT images. Bilateral muscular activity is likely physiologic. Incidental CT findings: Stable mild thyroid nodularity without hypermetabolic activity. Bilateral carotid atherosclerosis. CHEST: There are no hypermetabolic mediastinal, hilar or axillary lymph nodes. There is no hypermetabolic pulmonary activity or suspicious pulmonary nodularity. Incidental CT findings: Grossly stable nodularity in the right middle lobe and left lung base, without hypermetabolic activity. Underlying moderate centrilobular emphysema. Mild atherosclerosis of the aorta and coronary arteries with probable calcifications of the aortic valve. ABDOMEN/PELVIS: There is no hypermetabolic activity within the liver, spleen or pancreas. A 2.6 x 1.6 cm left adrenal  nodule on image 122/4 is unchanged from the abdominal CT of 06/08/2017, but demonstrates mild hypermetabolic activity (SUV max 4.7). There is no hypermetabolic nodal activity. Incidental CT findings: Percutaneous G-tube noted. There are multiple hepatic cysts, postsurgical changes in the transverse colon, aortic and branch vessel atherosclerosis and radiotherapy seeds in the prostate gland. There is a possible small left-sided bladder calculus (image 188/4). SKELETON: There is no hypermetabolic activity to suggest osseous metastatic disease. Degenerative activity anteriorly at the L3-4 disc space. Incidental CT findings: none IMPRESSION: 1. No residual mass or abnormal metabolic activity in the glottic region. No hypermetabolic or enlarged cervical lymph nodes. 2. No evidence of distant metastatic disease. 3. Mildly hypermetabolic left adrenal nodule is unchanged in size from 2018, probably an adenoma. 4. Stable additional incidental findings including Aortic Atherosclerosis (ICD10-I70.0) and Emphysema (ICD10-J43.9). Possible small nonobstructing bladder calculus. Electronically Signed   By: Richardean Sale M.D.   On: 01/13/2020 15:37    Impression/Plan:   Throat cancer. Cancer Staging Malignant neoplasm of supraglottis [facility]) Staging form: Larynx - Supraglottis, AJCC 8th Edition - Clinical stage from 08/06/2019: Stage III (cT3, cN0, cM0) - Signed by Eppie Gibson, MD on 08/08/2019  Head and neck oncology status: he is doing much better symptomatically. I personally reviewed his PET images. PET scan shows complete radiographic response. I recommend that his first baseline laryngoscopy take place at his follow-up with otolaryngology, as they prefer to see patients once the PET scan has been performed. We will facilitate that appointment and then I will see the patient back in November so that we can alternate laryngoscopies and physical exams moving forward.  Nutrition: much improved. Will arrange for PEG  removal   Swallowing: Denies any dysphagia, much improved.  Continue SLP   Thyroid function: We will check this annually and draw labs at his November follow-up Lab Results  Component Value Date   TSH 0.494 08/13/2019    Physical therapy: We'll facilitate PT referral for reconditioning, posture exercises, lymphedema in neck.   Follow-up plans: we will contact Dr. Servando [place]ina office for follow up in 3-4 weeks for laryngoscopy. They are pleased with this plan and will see met at the end of November next. They know to call me if they have any further questions or concerns  On date of service, in total, I spent 25 minutes on this encounter. Patient seen in person. _____________________________________   Eppie Gibson, MD

## 2020-01-20 ENCOUNTER — Telehealth: Payer: Self-pay | Admitting: *Deleted

## 2020-01-20 NOTE — Telephone Encounter (Signed)
CALLED PATIENT TO INFORM OF LAB AND FU FOR 05-12-20, SPOKE WITH PATIENT AND HE IS AWARE OF THESE APPTS.

## 2020-01-20 NOTE — Telephone Encounter (Signed)
CALLED PATIENT TO INFORM THAT IR IS WORKING ON HIS PEG TUBE REMOVAL ORDER, THEY SAID THAT THEY WOULD CALL HIM, PATIENT'S WIFE, Julian Brown THAT SHE UNDERSTOOD THIS

## 2020-01-22 ENCOUNTER — Ambulatory Visit (HOSPITAL_COMMUNITY)
Admission: RE | Admit: 2020-01-22 | Discharge: 2020-01-22 | Disposition: A | Discharge status: Home / Self Care | Payer: Medicare Other | Source: Ambulatory Visit | Attending: Radiation Oncology | Admitting: Radiation Oncology

## 2020-01-22 ENCOUNTER — Other Ambulatory Visit: Payer: Self-pay

## 2020-01-22 DIAGNOSIS — C321 Malignant neoplasm of supraglottis: Secondary | ICD-10-CM

## 2020-01-22 DIAGNOSIS — R1310 Dysphagia, unspecified: Secondary | ICD-10-CM | POA: Insufficient documentation

## 2020-01-22 DIAGNOSIS — Z431 Encounter for attention to gastrostomy: Secondary | ICD-10-CM | POA: Diagnosis not present

## 2020-01-22 HISTORY — PX: IR GASTROSTOMY TUBE REMOVAL: IMG5492

## 2020-01-22 MED ORDER — LIDOCAINE VISCOUS HCL 2 % MT SOLN
OROMUCOSAL | Status: AC
  Administered 2020-01-22: 5 mL
  Filled 2020-01-22: qty 15

## 2020-01-22 NOTE — Procedures (Signed)
Pre-procedure diagnosis: Dysphagia Post-procedure diagnosis: Same  Successful bedside removal of intact pull through G-tube. No immediate post procedural complications. EBL: zero  Please see imaging section of Epic for full dictation.  Theresa Duty 01/22/2020 1:42 PM

## 2020-02-06 ENCOUNTER — Encounter: Payer: Self-pay | Admitting: Physical Therapy

## 2020-02-06 ENCOUNTER — Ambulatory Visit: Payer: Medicare Other | Admitting: Physical Therapy

## 2020-02-06 ENCOUNTER — Other Ambulatory Visit: Payer: Self-pay

## 2020-02-06 DIAGNOSIS — R1310 Dysphagia, unspecified: Secondary | ICD-10-CM | POA: Diagnosis not present

## 2020-02-06 DIAGNOSIS — I890 Lymphedema, not elsewhere classified: Secondary | ICD-10-CM

## 2020-02-06 DIAGNOSIS — Z9181 History of falling: Secondary | ICD-10-CM

## 2020-02-06 DIAGNOSIS — M6281 Muscle weakness (generalized): Secondary | ICD-10-CM

## 2020-02-06 DIAGNOSIS — R293 Abnormal posture: Secondary | ICD-10-CM

## 2020-02-06 NOTE — Therapy (Signed)
[place], [place], [address] Phone: [phone]   Fax:  [phone]  Physical Therapy Re-Evaluation  Patient Details  Name: Julian Brown MRN: [id number] Date of Birth: 1932-09-09 Referring Provider (PT): Dr. Isidore Moos   Encounter Date: 02/06/2020   PT End of Session - 02/06/20 1825    Visit Number 1    Number of Visits 9    Date for PT Re-Evaluation 03/12/20    PT Start Time 1400    PT Stop Time 1445    PT Time Calculation (min) 45 min    Activity Tolerance Patient tolerated treatment well    Behavior During Therapy [facility] for tasks assessed/performed           Past Medical History:  Diagnosis Date  . Acute gangrenous cholecystitis s/p lap cholecystectomy 06/08/2017 06/08/2017  . Arthritis   . Colon cancer ([place])   . ED (erectile dysfunction)   . Emphysema of lung ([place])   . Hyperlipemia   . Hypertension   . Prostate cancer [facility])    prostate cancer-tx radiation  . Wears glasses     Past Surgical History:  Procedure Laterality Date  . APPENDECTOMY    . CARPAL TUNNEL RELEASE  5/12   lt  . CARPAL TUNNEL RELEASE  12/13/2011   Procedure: CARPAL TUNNEL RELEASE;  Surgeon: [name]., MD;  Location: [facility];  Service: Orthopedics;  Laterality: Right;  and inject right wrist  . CHOLECYSTECTOMY N/A 06/08/2017   Procedure: LAPAROSCOPIC CHOLECYSTECTOMY WITH LYSIS OF ADHESIONS;  Surgeon: [name], MD;  Location[facility];  Service: General;  Laterality: N/A;  . [place]   hemicolectomy-rt-ca  . COLONOSCOPY     about 12 inches of colon removed due to colon cancer  . IR GASTROSTOMY TUBE MOD SED  11/06/2019  . IR GASTROSTOMY TUBE REMOVAL  01/22/2020    There were no vitals filed for this visit.    Subjective Assessment - 02/06/20 1412    Subjective Pt returns to PT after radiation treatment.  He plans to go back to his family doctor to follow up on his blood  pressure and cholesterol medicine. Pt says at some point he might return to Dr. Ninfa Linden about his knees  He feels like he is doing well with mobility His wife is interested in learning what she can do at home to help    Patient is accompained by: Family member   wife   Pertinent History OA bil knees, HTN, high cholesterol, stage 2 COPD, Pt  diagnosed with Larynx Cancer  completed 35 radiation session  on April 27 .   SUSPENSION MICRO DIRECT LARYNGOSCOPY W/ CO2 LASER on 07/12/19 at [facility] Pt had PEG removed on 01/22/2020    Currently in Pain? No/denies              [facility] PT Assessment - 02/06/20 0001      Assessment   Medical Diagnosis larynx cancer    Referring Provider (PT) Dr. Isidore Moos    Onset Date/Surgical Date 07/12/19    Hand Dominance Right    Prior Therapy no      Precautions   Precaution Comments none      Restrictions   Weight Bearing Restrictions No      Home Environment   Living Environment Private residence    Living Arrangements Spouse/significant other    Available Help at Discharge Family    Type of [place]  Additional Comments no home mobility issues      Prior Function   Level of Independence Independent    Vocation Retired      Associate Professor   Overall Cognitive Status Impaired/Different from baseline    Area of Impairment Memory    Memory Comments needed repeated directions for inital instruction in manual lymph drainage     Problem Solving Slow processing      Observation/Other Assessments   Observations pt walks in with forward posture, shuffling gait       Coordination   Gross Motor Movements are Fluid and Coordinated Yes      Functional Tests   Functional tests Sit to Stand      Sit to Stand   Comments 9 reps in 30 seconds from 19 inch mat height       Posture/Postural Control   Posture/Postural Control Postural limitations    Postural Limitations Rounded Shoulders;Forward head;Increased thoracic kyphosis;Flexed trunk      ROM / Strength    AROM / PROM / Strength AROM;Strength      AROM   Overall AROM Comments shoulders limited in extremes of range of motion  in seated and no pain    Cervical Flexion --    Cervical Extension --    Cervical - Right Side Bend --    Cervical - Left Side Bend --    Cervical - Right Rotation --    Cervical - Left Rotation --      Strength   Overall Strength Deficits    Overall Strength Comments wife reports pt lost 35# in treatment and occasionally needs help to get out of his low chair at home He also reports decreased strength in his legs becaus of knee arthritis       Ambulation/Gait   Gait Comments gait WNL; no AD      Standardized Balance Assessment   Standardized Balance Assessment Timed Up and Go Test      Timed Up and Go Test   TUG Normal TUG    Normal TUG (seconds) 14.09             LYMPHEDEMA/ONCOLOGY QUESTIONNAIRE - 02/06/20 0001      Head and Neck   8 cm superior to sternal notch around neck 42.5 cm    Other 25   tragus to tragus over the largest part of the neck/chin                  Objective measurements completed on examination: See above findings.       [facility] Adult PT Treatment/Exercise - 02/06/20 0001      Exercises   Exercises Other Exercises    Other Exercises  provided information about [name] [facility] online exercise classes       Manual Therapy   Manual Therapy Edema management;Manual Lymphatic Drainage (MLD)    Manual therapy comments provided chip pack for pt to wear at neck /submental areal     Manual Lymphatic Drainage (MLD) began intial instruction in MLD with stationary circles at neck and deep breathing                   PT Education - 02/06/20 1824    Education Details online exercise instruction, began instruction in MLD and use of chip pack    Person(s) Educated Patient;Spouse    Methods Explanation;Demonstration;Handout    Comprehension Verbalized understanding               PT Long  Term Goals - 02/06/20  1831      PT LONG TERM GOAL #1   Title Pt and wife will report that know how to manage lymphedema with manual lymph drainage and compression    Time 4    Period Weeks    Status New      PT LONG TERM GOAL #2   Title Pt will be independent in a home exercise program for improved strength and balance    Time 4    Period Weeks    Status New      PT LONG TERM GOAL #3   Title Pt will decrease TUG score to < 11 to demonstrate increased functional mobility in gait    Baseline 14.09 on 02/06/2020    Time 4    Period Weeks    Status New                  Plan - 02/06/20 1825    Clinical Impression Statement Pt comes back to PT after completion of treatment and removal of PEG.  He has lost 35 # in treatment.  He has mild lymphedema visibly in anterior neck , though measurements are less likely due to weight loss.  He also has decreased sit to stand and TUG scores.  He would benefit from PT for stregthening and balance work as well as instruction in MLD and use of compression to neck  He will need extra time with instructions, and wife is present and supportive    Personal Factors and Comorbidities Comorbidity 3+;Comorbidity 1    Comorbidities COPD, arthritis in knees, radiation treatment to larynx    Examination-Activity Limitations Locomotion Level;Lift;Transfers;Stand    Stability/Clinical Decision Making Stable/Uncomplicated    Clinical Decision Making Low    Rehab Potential Excellent    PT Frequency 2x / week    PT Duration 4 weeks    PT Treatment/Interventions ADLs/Self Care Home Management;Gait training;Stair training;Functional mobility training;Therapeutic activities;Therapeutic exercise;Balance training;Orthotic Fit/Training;Patient/family education;Manual techniques;Manual lymph drainage    PT Next Visit Plan teach MLD and assess chip pack/ need compression garment??  teach UE and LE strenth exercise and balance work    Oncologist with Plan of Care Patient             Patient will benefit from skilled therapeutic intervention in order to improve the following deficits and impairments:  Abnormal gait, Decreased balance, Decreased endurance, Decreased mobility, Difficulty walking, Postural dysfunction, Increased edema, Decreased cognition  Visit Diagnosis: Abnormal posture - Plan: PT plan of care cert/re-cert  Lymphedema, not elsewhere classified - Plan: PT plan of care cert/re-cert  At risk for falls - Plan: PT plan of care cert/re-cert  Muscle weakness (generalized) - Plan: PT plan of care cert/re-cert     Problem List Patient Active Problem List   Diagnosis Date Noted  . Malignant neoplasm of supraglottis ([place]) 08/08/2019  . Hoarseness of voice 06/12/2019  . Cough 05/23/2019  . Primary osteoarthritis of right wrist 07/31/2018  . Stage 2 moderate COPD by GOLD classification ([place]) 05/16/2018  . Allergic rhinitis 05/16/2018  . Nocturnal hypoxemia 05/16/2018  . Abnormal finding on lung imaging 05/16/2018  . Hypertension   . Hyperlipemia   . Acute gangrenous cholecystitis s/p lap cholecystectomy 06/08/2017 06/08/2017  . Pain in right wrist 01/04/2017  . Chronic pain of left knee 01/04/2017  . Chronic pain of right knee 01/04/2017  . Unilateral primary osteoarthritis, left knee 01/04/2017  . Unilateral primary osteoarthritis, right knee 01/04/2017  Donato Heinz. Owens Shark PT  Norwood Levo 02/06/2020, 6:35 PM  [place], [place], [address] Phone: [phone]   Fax:  [phone]  Name: SAMARTH OGLE MRN: [id number] Date of Birth: Dec 29, 1932

## 2020-02-18 ENCOUNTER — Ambulatory Visit: Payer: Medicare Other | Admitting: Rehabilitation

## 2020-02-26 ENCOUNTER — Ambulatory Visit: Payer: Medicare Other | Attending: Radiation Oncology | Admitting: Rehabilitation

## 2020-02-26 ENCOUNTER — Encounter: Payer: Self-pay | Admitting: Rehabilitation

## 2020-02-26 ENCOUNTER — Other Ambulatory Visit: Payer: Self-pay

## 2020-02-26 DIAGNOSIS — R1310 Dysphagia, unspecified: Secondary | ICD-10-CM | POA: Diagnosis present

## 2020-02-26 DIAGNOSIS — I890 Lymphedema, not elsewhere classified: Secondary | ICD-10-CM | POA: Diagnosis present

## 2020-02-26 DIAGNOSIS — R293 Abnormal posture: Secondary | ICD-10-CM | POA: Diagnosis not present

## 2020-02-26 DIAGNOSIS — Z9181 History of falling: Secondary | ICD-10-CM | POA: Diagnosis present

## 2020-02-26 DIAGNOSIS — M6281 Muscle weakness (generalized): Secondary | ICD-10-CM | POA: Diagnosis present

## 2020-02-26 NOTE — Therapy (Signed)
[place], [place], [address] Phone: [phone]   Fax:  [phone]  Physical Therapy Treatment  Patient Details  Name: Julian Brown MRN: [id number] Date of Birth: 1933-06-06 Referring Provider (PT): Dr. Isidore Moos   Encounter Date: 02/26/2020   PT End of Session - 02/26/20 1216    Visit Number 2    Number of Visits 9    Date for PT Re-Evaluation 03/12/20    PT Start Time 1100    PT Stop Time 1154    PT Time Calculation (min) 54 min    Activity Tolerance Patient tolerated treatment well    Behavior During Therapy [facility] for tasks assessed/performed           Past Medical History:  Diagnosis Date  . Acute gangrenous cholecystitis s/p lap cholecystectomy 06/08/2017 06/08/2017  . Arthritis   . Colon cancer ([place])   . ED (erectile dysfunction)   . Emphysema of lung ([place])   . Hyperlipemia   . Hypertension   . Prostate cancer [facility])    prostate cancer-tx radiation  . Wears glasses     Past Surgical History:  Procedure Laterality Date  . APPENDECTOMY    . CARPAL TUNNEL RELEASE  5/12   lt  . CARPAL TUNNEL RELEASE  12/13/2011   Procedure: CARPAL TUNNEL RELEASE;  Surgeon: [name]., MD;  Location: [facility];  Service: Orthopedics;  Laterality: Right;  and inject right wrist  . CHOLECYSTECTOMY N/A 06/08/2017   Procedure: LAPAROSCOPIC CHOLECYSTECTOMY WITH LYSIS OF ADHESIONS;  Surgeon: [name], MD;  [facility];  Service: General;  Laterality: N/A;  . [place]   hemicolectomy-rt-ca  . COLONOSCOPY     about 12 inches of colon removed due to colon cancer  . IR GASTROSTOMY TUBE MOD SED  11/06/2019  . IR GASTROSTOMY TUBE REMOVAL  01/22/2020    There were no vitals filed for this visit.   Subjective Assessment - 02/26/20 1100    Subjective The biggest issue is the swelling under the neck.  The pack seems to help alot.    Pertinent History OA bil knees,  HTN, high cholesterol, stage 2 COPD, Pt  diagnosed with Larynx Cancer  completed 35 radiation session  on April 27 .   SUSPENSION MICRO DIRECT LARYNGOSCOPY W/ CO2 LASER on 07/12/19 at [facility] Pt had PEG removed on 01/22/2020    Currently in Pain? No/denies                             [facility] Adult PT Treatment/Exercise - 02/26/20 0001      Exercises   Exercises Shoulder      Shoulder Exercises: Seated   Retraction Both;10 reps    Other Seated Exercises W retractions x 10      Manual Therapy   Manual Therapy Edema management;Manual Lymphatic Drainage (MLD);Soft tissue mobilization    Soft tissue mobilization briefly and gentle to the Lt anterior neck with restriction here from radiation    Manual Lymphatic Drainage (MLD) started self instruction on MLD seated in front of the mirror with tshirt removed using [place] MLD handout modified.  Performed each step for patient and then patient performed sequence x 2 with vcs/tcs as needed mainly to decrease pressure and speed.  Included steps 1-5 and then 9 and finishing up with collarbones.  (collarbones, deep breathing, armpits, side of chest, front of chest, back  of neck, side of neck, front of neck, and collarbones.    Then PT performed in supine HOB more work from anterior neck towards bil axillary nodes                  PT Education - 02/26/20 1216    Education Details self MLD for the anterior neck    Person(s) Educated Patient;Spouse    Methods Explanation;Demonstration;Tactile cues;Verbal cues;Handout    Comprehension Verbalized understanding;Returned demonstration;Verbal cues required;Tactile cues required;Need further instruction               PT Long Term Goals - 02/06/20 1831      PT LONG TERM GOAL #1   Title Pt and wife will report that know how to manage lymphedema with manual lymph drainage and compression    Time 4    Period Weeks    Status New      PT LONG TERM GOAL #2   Title Pt will be  independent in a home exercise program for improved strength and balance    Time 4    Period Weeks    Status New      PT LONG TERM GOAL #3   Title Pt will decrease TUG score to < 11 to demonstrate increased functional mobility in gait    Baseline 14.09 on 02/06/2020    Time 4    Period Weeks    Status New                 Plan - 02/26/20 1217    Clinical Impression Statement Pt and wife educated on self MLD using anterior nort[place] handout modified per notes.  Overall pt was able to learn the sequence after 2 attempts.  Started some postural strength as well.  Pt with mild Lt sided edema with some restriction noted due to radiation changes in the soft tissue.    PT Frequency 2x / week    PT Duration 4 weeks    PT Treatment/Interventions ADLs/Self Care Home Management;Gait training;Stair training;Functional mobility training;Therapeutic activities;Therapeutic exercise;Balance training;Orthotic Fit/Training;Patient/family education;Manual techniques;Manual lymph drainage    PT Next Visit Plan teach MLD and assess chip pack/ need compression garment??  teach UE and LE strenth exercise and balance work    Oncologist with Plan of Care Patient           Patient will benefit from skilled therapeutic intervention in order to improve the following deficits and impairments:     Visit Diagnosis: Abnormal posture  Lymphedema, not elsewhere classified  At risk for falls  Muscle weakness (generalized)     Problem List Patient Active Problem List   Diagnosis Date Noted  . Malignant neoplasm of supraglottis ([place]) 08/08/2019  . Hoarseness of voice 06/12/2019  . Cough 05/23/2019  . Primary osteoarthritis of right wrist 07/31/2018  . Stage 2 moderate COPD by GOLD classification ([place]) 05/16/2018  . Allergic rhinitis 05/16/2018  . Nocturnal hypoxemia 05/16/2018  . Abnormal finding on lung imaging 05/16/2018  . Hypertension   . Hyperlipemia   . Acute gangrenous cholecystitis  s/p lap cholecystectomy 06/08/2017 06/08/2017  . Pain in right wrist 01/04/2017  . Chronic pain of left knee 01/04/2017  . Chronic pain of right knee 01/04/2017  . Unilateral primary osteoarthritis, left knee 01/04/2017  . Unilateral primary osteoarthritis, right knee 01/04/2017    Stark Bray 02/26/2020, 12:19 PM  [place], [place], [address] Phone: [phone]  Fax:  [phone]  Name: Julian Brown MRN: [id number] Date of Birth: 12/06/1932

## 2020-02-27 ENCOUNTER — Ambulatory Visit: Payer: Medicare Other

## 2020-02-27 DIAGNOSIS — R1310 Dysphagia, unspecified: Secondary | ICD-10-CM

## 2020-02-27 DIAGNOSIS — R293 Abnormal posture: Secondary | ICD-10-CM | POA: Diagnosis not present

## 2020-02-27 NOTE — Therapy (Signed)
[facility] [address] [place], [place], [address] Phone: [phone]   Fax:  [phone]  Speech Language Pathology Treatment/Renewal Summary  Patient Details  Name: Julian Brown MRN: [id number] Date of Birth: 1932-12-03 Referring Provider (SLP): Eppie Gibson, MD   Encounter Date: 02/27/2020   End of Session - 02/27/20 1521    Visit Number 6    Number of Visits 8    Date for SLP Re-Evaluation 05/27/20    SLP Start Time 5956    SLP Stop Time  1516    SLP Time Calculation (min) 27 min    Activity Tolerance Patient tolerated treatment well           Past Medical History:  Diagnosis Date  . Acute gangrenous cholecystitis s/p lap cholecystectomy 06/08/2017 06/08/2017  . Arthritis   . Colon cancer ([place])   . ED (erectile dysfunction)   . Emphysema of lung ([place])   . Hyperlipemia   . Hypertension   . Prostate cancer [facility])    prostate cancer-tx radiation  . Wears glasses     Past Surgical History:  Procedure Laterality Date  . APPENDECTOMY    . CARPAL TUNNEL RELEASE  5/12   lt  . CARPAL TUNNEL RELEASE  12/13/2011   Procedure: CARPAL TUNNEL RELEASE;  Surgeon: [name]., MD;  Location: [facility];  Service: Orthopedics;  Laterality: Right;  and inject right wrist  . CHOLECYSTECTOMY N/A 06/08/2017   Procedure: LAPAROSCOPIC CHOLECYSTECTOMY WITH LYSIS OF ADHESIONS;  Surgeon: [name], MD;  [facility];  Service: General;  Laterality: N/A;  . [place]   hemicolectomy-rt-ca  . COLONOSCOPY     about 12 inches of colon removed due to colon cancer  . IR GASTROSTOMY TUBE MOD SED  11/06/2019  . IR GASTROSTOMY TUBE REMOVAL  01/22/2020    There were no vitals filed for this visit.   Subjective Assessment - 02/27/20 1454    Subjective "The girl told me you were serving country style steak."    Patient is accompained by: Family member   wife   Currently in Pain?  No/denies                 ADULT SLP TREATMENT - 02/27/20 1455      General Information   Behavior/Cognition Alert;Cooperative;Requires cueing;Distractible      Treatment Provided   Treatment provided Dysphagia      Dysphagia Treatment   Temperature Spikes Noted No    Respiratory Status Room air    Oral Cavity - Dentition Missing dentition    Treatment Methods Therapeutic exercise;Patient/caregiver education;Skilled observation    Patient observed directly with PO's Yes    Type of PO's observed Dysphagia 3 (soft);Thin liquids    Liquids provided via Cup    Oral Phase Signs & Symptoms --   nothing noted   Pharyngeal Phase Signs & Symptoms --   nothing else   Other treatment/comments (wife) "He still has that mucous. He had a coughing spell earlier but it is not when he's eating." Pt req'd min A with Mendelsohn (hold in teh middle of swallow). Pt independent by session end. No overt s/sx aspiration PNA reported nor observed today.  Pilar Plate req'd cues about when to stop HEP BID and decr frequency to once/day.Currently pt only completing QD, 5 days/week- SLP encouraged pt to do BID, 6 days a week.       Assessment / Recommendations / Plan  Plan Continue with current plan of care;Other (Comment)   every other month     Progression Toward Goals   Progression toward goals Progressing toward goals            SLP Education - 02/27/20 1520    Education Details Mendelsohn procedure    Person(s) Educated Patient;Spouse    Methods Explanation;Demonstration;Verbal cues;Handout    Comprehension Verbalized understanding;Returned demonstration;Verbal cues required;Need further instruction            SLP Short Term Goals - 10/17/19 1222      SLP SHORT TERM GOAL #1   Title pt will perform HEP with occasional min A    Status Partially Met      SLP SHORT TERM GOAL #2   Title pt will tell SLP rationale for HEP completion with modified independence    Period --   sessions   Status  Partially Met      SLP SHORT TERM GOAL #3   Title pt and/or wife witll tell SLP 3 overt s/s aspiration PNA    Period --   sessions   Status Partially Met            SLP Long Term Goals - 01/16/20 1510      SLP LONG TERM GOAL #1   Title pt and/or wife will state how a food journal can foster hastened return to most normalized diet    Status Deferred      SLP LONG TERM GOAL #2   Title pt will complete HEP for swallowing with rare min A over two sessions    Baseline 11-14-19, 01-16-20    Time --    Period --    Status Achieved      SLP LONG TERM GOAL #3   Title pt will tell SLP when he can decr frequency of HEP after radiation,with modified independence, over two sessions    Baseline 01-16-20    Time 2    Period --   sessions   Status On-going            Plan - 02/27/20 1522    Clinical Impression Statement Pt presents today with WNL/WFL swallowing ability with dys III and water. Overt s/sx aspiration were not seen today. No overt s/sx aspiraiton PNA reported or observed today. Pt  now has PEG tube removed. Req'd mod cues for [employer]. Pt will cont to be seen once every other month. See "other comments" for more details. Data suggests that as pts progress through rad therapy that their swallowing ability will decrease. Also, WNL swallowing is threatened by muscle fibrosis that will likely develop after rad is completed. Therefore, SLP developed an indivdualized exercise program to mitigate/eliminate muscle fibrosis following rad tx. Skilled ST would be beneficial to the pt in order to regularly assess pt's safety with POs and/or need for instrumental swallow assessment, as well as to assess proper completion of HEP.    Speech Therapy Frequency --   once approx every eight weeks   Duration --   8 total sessions   Treatment/Interventions Aspiration precaution training;Pharyngeal strengthening exercises;Diet toleration management by SLP;Trials of upgraded texture/liquids;Patient/family  education;Internal/external aids;SLP instruction and feedback;Compensatory techniques    Potential to Achieve Goals Good    Potential Considerations Ability to learn/carryover information   pt observed slower processing, decr'd memory (looked to wife for questions re: medical history, pt could not recall >2 details from yesterday's visit to cancer center)  Patient will benefit from skilled therapeutic intervention in order to improve the following deficits and impairments:   Dysphagia, unspecified type    Problem List Patient Active Problem List   Diagnosis Date Noted  . Malignant neoplasm of supraglottis ([place]) 08/08/2019  . Hoarseness of voice 06/12/2019  . Cough 05/23/2019  . Primary osteoarthritis of right wrist 07/31/2018  . Stage 2 moderate COPD by GOLD classification ([place]) 05/16/2018  . Allergic rhinitis 05/16/2018  . Nocturnal hypoxemia 05/16/2018  . Abnormal finding on lung imaging 05/16/2018  . Hypertension   . Hyperlipemia   . Acute gangrenous cholecystitis s/p lap cholecystectomy 06/08/2017 06/08/2017  . Pain in right wrist 01/04/2017  . Chronic pain of left knee 01/04/2017  . Chronic pain of right knee 01/04/2017  . Unilateral primary osteoarthritis, left knee 01/04/2017  . Unilateral primary osteoarthritis, right knee 01/04/2017    [facility] ,MS, CCC-SLP  02/27/2020, 3:24 PM  [place] [address] [place], [place], [address] Phone: [phone]   Fax:  [phone]   Name: Julian Brown MRN: [id number] Date of Birth: 1933/03/19

## 2020-03-03 ENCOUNTER — Other Ambulatory Visit: Payer: Self-pay

## 2020-03-03 ENCOUNTER — Ambulatory Visit: Payer: Medicare Other | Admitting: Physical Therapy

## 2020-03-03 DIAGNOSIS — R293 Abnormal posture: Secondary | ICD-10-CM | POA: Diagnosis not present

## 2020-03-03 DIAGNOSIS — Z9181 History of falling: Secondary | ICD-10-CM

## 2020-03-03 DIAGNOSIS — M6281 Muscle weakness (generalized): Secondary | ICD-10-CM

## 2020-03-03 DIAGNOSIS — I890 Lymphedema, not elsewhere classified: Secondary | ICD-10-CM

## 2020-03-03 NOTE — Therapy (Addendum)
[place], [place], [address] Phone: [phone]   Fax:  [phone]  Physical Therapy Treatment  Patient Details  Name: Julian Brown MRN: [id number] Date of Birth: Sep 30, 1932 Referring Provider (PT): Dr. Isidore Moos   Encounter Date: 03/03/2020   PT End of Session - 03/03/20 1957    Visit Number 3    Number of Visits 9    Date for PT Re-Evaluation 03/12/20    PT Start Time 1500    PT Stop Time 1454    PT Time Calculation (min) 1434 min    Activity Tolerance Patient tolerated treatment well    Behavior During Therapy [facility] for tasks assessed/performed         addendum: error in recording should be time in 1500, time out 1554  Past Medical History:  Diagnosis Date  . Acute gangrenous cholecystitis s/p lap cholecystectomy 06/08/2017 06/08/2017  . Arthritis   . Colon cancer ([place])   . ED (erectile dysfunction)   . Emphysema of lung ([place])   . Hyperlipemia   . Hypertension   . Prostate cancer [facility])    prostate cancer-tx radiation  . Wears glasses     Past Surgical History:  Procedure Laterality Date  . APPENDECTOMY    . CARPAL TUNNEL RELEASE  5/12   lt  . CARPAL TUNNEL RELEASE  12/13/2011   Procedure: CARPAL TUNNEL RELEASE;  Surgeon: [name]., MD;  Location: [facility];  Service: Orthopedics;  Laterality: Right;  and inject right wrist  . CHOLECYSTECTOMY N/A 06/08/2017   Procedure: LAPAROSCOPIC CHOLECYSTECTOMY WITH LYSIS OF ADHESIONS;  Surgeon: [name], MD;  Location: [facility];  Service: General;  Laterality: N/A;  . [place]   hemicolectomy-rt-ca  . COLONOSCOPY     about 12 inches of colon removed due to colon cancer  . IR GASTROSTOMY TUBE MOD SED  11/06/2019  . IR GASTROSTOMY TUBE REMOVAL  01/22/2020    There were no vitals filed for this visit.   Subjective Assessment - 03/03/20 1509    Subjective My mouth is dry.  Pt says he is eating and drinking  ok. Biggest problem is his knees.  He may be going to get shots in his knees.    Pertinent History OA bil knees, HTN, high cholesterol, stage 2 COPD, Pt  diagnosed with Larynx Cancer  completed 35 radiation session  on April 27 .   SUSPENSION MICRO DIRECT LARYNGOSCOPY W/ CO2 LASER on 07/12/19 at [facility] Pt had PEG removed on 01/22/2020    Currently in Pain? No/denies                 LYMPHEDEMA/ONCOLOGY QUESTIONNAIRE - 03/03/20 0001      Head and Neck   8 cm superior to sternal notch around neck 41.5 cm    Other 24.5                      [facility] Adult PT Treatment/Exercise - 03/03/20 0001      Exercises   Exercises Neck;Shoulder;Knee/Hip      Neck Exercises: Seated   Other Seated Exercise neck flexion, extension, with "kiss the sky", rotation, lateral felxion and upper trunk felxion, extension and rotation       Knee/Hip Exercises: Standing   Other Standing Knee Exercises "wall taps" with glute activation for terminal hip extension in standing       Shoulder Exercises: Seated   Flexion AROM;Both;5 reps  Manual Therapy   Manual Therapy Edema management;Manual Lymphatic Drainage (MLD)    Edema Management remeasured neck     Manual Lymphatic Drainage (MLD) reviewed MLD seated in front of the mirror  Performed each step for patient and then patient performed sequence x 2 with vcs/tcs as needed mainly to decrease pressure and speed.  Included steps 1-5 and then 9 and finishing up with collarbones.  (collarbones, deep breathing, armpits, side of chest, front of chest, back of neck, side of neck, front of neck, and collarbones. Pt also performed more work to antierior neck                        PT Long Term Goals - 02/06/20 1831      PT LONG TERM GOAL #1   Title Pt and wife will report that know how to manage lymphedema with manual lymph drainage and compression    Time 4    Period Weeks    Status New      PT LONG TERM GOAL #2   Title Pt will be  independent in a home exercise program for improved strength and balance    Time 4    Period Weeks    Status New      PT LONG TERM GOAL #3   Title Pt will decrease TUG score to < 11 to demonstrate increased functional mobility in gait    Baseline 14.09 on 02/06/2020    Time 4    Period Weeks    Status New                 Plan - 03/03/20 1950    Clinical Impression Statement Pt had small firm area at start of treatment that seemed to be less and softer at end of session  Pt able to demonstate good technique. Added home exercise to home program for  neck and upper body ROM and hip strengthening.  Pt was able to perform well.  He many benefit from a compression garment and pt approved sending insurance  information to see if they have coverage.    Personal Factors and Comorbidities Comorbidity 3+;Comorbidity 1    Comorbidities COPD, arthritis in knees, radiation treatment to larynx    Stability/Clinical Decision Making Stable/Uncomplicated    Rehab Potential Excellent    PT Frequency 2x / week    PT Duration 4 weeks    PT Treatment/Interventions ADLs/Self Care Home Management;Gait training;Stair training;Functional mobility training;Therapeutic activities;Therapeutic exercise;Balance training;Orthotic Fit/Training;Patient/family education;Manual techniques;Manual lymph drainage    PT Next Visit Plan review  MLD and assess chip pack/ need compression garment? and help get it if needed.   teach UE and LE strenth exercise and balance work    Oncologist with Plan of Care Patient;Family member/caregiver    Family Member Consulted wife           Patient will benefit from skilled therapeutic intervention in order to improve the following deficits and impairments:  Abnormal gait, Decreased balance, Decreased endurance, Decreased mobility, Difficulty walking, Postural dysfunction, Increased edema, Decreased cognition  Visit Diagnosis: Abnormal posture  Lymphedema, not elsewhere  classified  At risk for falls  Muscle weakness (generalized)     Problem List Patient Active Problem List   Diagnosis Date Noted  . Malignant neoplasm of supraglottis ([place]) 08/08/2019  . Hoarseness of voice 06/12/2019  . Cough 05/23/2019  . Primary osteoarthritis of right wrist 07/31/2018  . Stage 2 moderate COPD by GOLD  classification ([place]) 05/16/2018  . Allergic rhinitis 05/16/2018  . Nocturnal hypoxemia 05/16/2018  . Abnormal finding on lung imaging 05/16/2018  . Hypertension   . Hyperlipemia   . Acute gangrenous cholecystitis s/p lap cholecystectomy 06/08/2017 06/08/2017  . Pain in right wrist 01/04/2017  . Chronic pain of left knee 01/04/2017  . Chronic pain of right knee 01/04/2017  . Unilateral primary osteoarthritis, left knee 01/04/2017  . Unilateral primary osteoarthritis, right knee 01/04/2017   Donato Heinz. Owens Shark PT  Norwood Levo 03/03/2020, 7:58 PM  [place], [place], [address] Phone: [phone]   Fax:  [phone]  Name: Julian Brown MRN: [id number] Date of Birth: October 03, 1932

## 2020-03-03 NOTE — Patient Instructions (Signed)
Wall taps with $50  Walk around during TV prgram changes  Backward shoulder rolls  Kiss the sky with good posture/shoulders down

## 2020-03-05 ENCOUNTER — Other Ambulatory Visit: Payer: Self-pay

## 2020-03-05 ENCOUNTER — Ambulatory Visit: Payer: Medicare Other

## 2020-03-05 DIAGNOSIS — Z9181 History of falling: Secondary | ICD-10-CM

## 2020-03-05 DIAGNOSIS — R293 Abnormal posture: Secondary | ICD-10-CM | POA: Diagnosis not present

## 2020-03-05 DIAGNOSIS — M6281 Muscle weakness (generalized): Secondary | ICD-10-CM

## 2020-03-05 DIAGNOSIS — I890 Lymphedema, not elsewhere classified: Secondary | ICD-10-CM

## 2020-03-05 NOTE — Therapy (Signed)
[place], [place], [address] Phone: [phone]   Fax:  [phone]  Physical Therapy Treatment  Patient Details  Name: Julian Brown MRN: [id number] Date of Birth: [birth year] Referring Provider (PT): Dr. Isidore Moos   Encounter Date: 03/05/2020   PT End of Session - 03/05/20 1330    Visit Number 4    Number of Visits 9    Date for PT Re-Evaluation 03/12/20    PT Start Time 1208    PT Stop Time 1311    PT Time Calculation (min) 63 min    Activity Tolerance Patient tolerated treatment well    Behavior During Therapy [facility] for tasks assessed/performed           Past Medical History:  Diagnosis Date  . Acute gangrenous cholecystitis s/p lap cholecystectomy 06/08/2017 06/08/2017  . Arthritis   . Colon cancer ([place])   . ED (erectile dysfunction)   . Emphysema of lung ([place])   . Hyperlipemia   . Hypertension   . Prostate cancer [facility])    prostate cancer-tx radiation  . Wears glasses     Past Surgical History:  Procedure Laterality Date  . APPENDECTOMY    . CARPAL TUNNEL RELEASE  5/12   lt  . CARPAL TUNNEL RELEASE  12/13/2011   Procedure: CARPAL TUNNEL RELEASE;  Surgeon: [name]., MD;  Location: [facility];  Service: Orthopedics;  Laterality: Right;  and inject right wrist  . CHOLECYSTECTOMY N/A 06/08/2017   Procedure: LAPAROSCOPIC CHOLECYSTECTOMY WITH LYSIS OF ADHESIONS;  Surgeon: [name], MD;  Location: [facility];  Service: General;  Laterality: N/A;  . [place]   hemicolectomy-rt-ca  . COLONOSCOPY     about 12 inches of colon removed due to colon cancer  . IR GASTROSTOMY TUBE MOD SED  11/06/2019  . IR GASTROSTOMY TUBE REMOVAL  01/22/2020    There were no vitals filed for this visit.   Subjective Assessment - 03/05/20 1216    Subjective I feel like the knot in my neck is smaller and the swelling is a little better, it doesn't bother me. I have an appt  with my knee doctor (Dr. Ninfa Linden) for my shots next week on 03/11/20.    Pertinent History OA bil knees, HTN, high cholesterol, stage 2 COPD, Pt  diagnosed with Larynx Cancer  completed 35 radiation session  on April 27 .   SUSPENSION MICRO DIRECT LARYNGOSCOPY W/ CO2 LASER on 07/12/19 at [facility] Pt had PEG removed on 01/22/2020    Patient Stated Goals learn what to do before radiation    Currently in Pain? No/denies                             [facility] Adult PT Treatment/Exercise - 03/05/20 0001      Knee/Hip Exercises: Stretches   Passive Hamstring Stretch Right;Left;2 reps;20 seconds    Passive Hamstring Stretch Limitations Pt returned therapist demo and VCs for full knee ext as available due to limitations      Knee/Hip Exercises: Seated   Long Arc Quad Strengthening;Right;Left;10 reps   5 sec holds   Long Arc Quad Limitations Pt returned therapist demo and VCs to remind to hold 5 sec    Ball Squeeze 10x using bolster returning therapist demo    Marching Strengthening;Right;Left;10 reps    Marching Limitations Demo and VCs for slow, controlled technique  Knee/Hip Exercises: Supine   Quad Sets Strengthening;Right;Left;15 reps    Short Arc Quad Sets Strengthening;Right;Left;10 reps    Bridges Strengthening;Both;10 reps    Straight Leg Raises Strengthening;Right;Left;10 reps   with quad set   Straight Leg Raises Limitations Tactile and VCs to "reset" quad set for each rep as pt has lag with Rt>Lt SLR      Manual Therapy   Manual Lymphatic Drainage (MLD) Briefly reviewed MLD at end of session to assess technique: pt required multiple hand over hand cuing and VCs to not slide at end of skin stretch, he did well with demonstrating light pressure                  PT Education - 03/05/20 1321    Education Details Bil quad strengthening and HS flexibility; reviewed self MLD    Person(s) Educated Patient;Spouse    Methods Explanation;Demonstration;Handout     Comprehension Verbalized understanding;Returned demonstration;Need further instruction               PT Long Term Goals - 02/06/20 1831      PT LONG TERM GOAL #1   Title Pt and wife will report that know how to manage lymphedema with manual lymph drainage and compression    Time 4    Period Weeks    Status New      PT LONG TERM GOAL #2   Title Pt will be independent in a home exercise program for improved strength and balance    Time 4    Period Weeks    Status New      PT LONG TERM GOAL #3   Title Pt will decrease TUG score to < 11 to demonstrate increased functional mobility in gait    Baseline 14.09 on 02/06/2020    Time 4    Period Weeks    Status New                 Plan - 03/05/20 1337    Clinical Impression Statement Pts neck is palpably softer than sessions previous per pt and his wife, and no fibrotic areas palpable today. Reviewed self MLD briefly at end of session as time allowed. He required max tactile and VCs for no sliding at end of skin stretch, did seem to do well with demosntrating light pressure. Most of session spent today progressing HEP to include knee/quad strengthening and HS flexibility. He was able to return good demonstration overall for exs, just initally required demo and VCs for confirmation of correct techinque. He reports sees Dr. Ninfa Linden next week and is hopeful to get shots that he has had before that have given him immediate relief.    Personal Factors and Comorbidities Comorbidity 3+;Comorbidity 1    Comorbidities COPD, arthritis in knees, radiation treatment to larynx    Examination-Activity Limitations Locomotion Level;Lift;Transfers;Stand    Stability/Clinical Decision Making Stable/Uncomplicated    Rehab Potential Excellent    PT Frequency 2x / week    PT Duration 4 weeks    PT Treatment/Interventions ADLs/Self Care Home Management;Gait training;Stair training;Functional mobility training;Therapeutic activities;Therapeutic  exercise;Balance training;Orthotic Fit/Training;Patient/family education;Manual techniques;Manual lymph drainage    PT Next Visit Plan review  MLD and assess chip pack/ need compression garment? and help get it if needed.  Review HEP issued today to assess technique, progress UE/LE strengthening exs    PT Home Exercise Plan Bil knee/quad strength and HS flexibility    Consulted and Agree with Plan of Care Patient;Family member/caregiver  Family Member Consulted wife           Patient will benefit from skilled therapeutic intervention in order to improve the following deficits and impairments:  Abnormal gait, Decreased balance, Decreased endurance, Decreased mobility, Difficulty walking, Postural dysfunction, Increased edema, Decreased cognition  Visit Diagnosis: Abnormal posture  Lymphedema, not elsewhere classified  At risk for falls  Muscle weakness (generalized)     Problem List Patient Active Problem List   Diagnosis Date Noted  . Malignant neoplasm of supraglottis ([place]) 08/08/2019  . Hoarseness of voice 06/12/2019  . Cough 05/23/2019  . Primary osteoarthritis of right wrist 07/31/2018  . Stage 2 moderate COPD by GOLD classification ([place]) 05/16/2018  . Allergic rhinitis 05/16/2018  . Nocturnal hypoxemia 05/16/2018  . Abnormal finding on lung imaging 05/16/2018  . Hypertension   . Hyperlipemia   . Acute gangrenous cholecystitis s/p lap cholecystectomy 06/08/2017 06/08/2017  . Pain in right wrist 01/04/2017  . Chronic pain of left knee 01/04/2017  . Chronic pain of right knee 01/04/2017  . Unilateral primary osteoarthritis, left knee 01/04/2017  . Unilateral primary osteoarthritis, right knee 01/04/2017    Otelia Limes, PTA 03/05/2020, 1:47 PM  [place], [place], [address] Phone: [phone]   Fax:  [phone]  Name: Julian Brown MRN: [id number] Date of Birth:  1932/11/18

## 2020-03-05 NOTE — Patient Instructions (Addendum)
KNEE: Extension, Long Arc Quads - Sitting    Raise leg until knee is straight. _10-20__ reps per set, holding for __5__ seconds,_1-2__ sets per day.  Marching    Alternate lifting knees as high as is comfortable, as if marching. Repeat _10-20__ times each leg, holding __5__ seconds. Do _2-3__ sessions per day.   Hip: Adduction / Abduction   Place ball between knees that is large enough to put hips in neutral. Do not twist hips out of position. 1.Squeeze top knee toward floor. 2. Lift top knee.  CAN ALSO DO THIS SITTING IN CHAIR.  Hold position __5_ seconds. Repeat sequence _10-20__ times. Lie on left side. Do _2-3__ sessions per day.  Short Arc [employer] a large can or rolled towel under left leg. Straighten leg. Hold __5__ seconds. Repeat __10-20__ times. Do __2-3__ sessions per day.  Quad Set    With other leg bent, foot flat, slowly tighten muscles on thigh of straight leg while counting out loud to __5__. Repeat with other leg. Repeat __20-30__ times. Do __2-3__ sessions per day. Straight Leg Raise    Tighten stomach and slowly raise locked right leg __6-10__ inches from floor. Count 5 up, and then 5 down. Repeat __10__ times per set. Do __2__ sets per session. Do __2-3__ sessions per day.  Bridge    Lie back, legs bent. Inhale, pressing hips up.  Exhale, rolling down along spine from top. Repeat _10___ times. Do __2__ sessions per day. DON'T PUSH PAIN.  Knee Extension (Hamstring Stretch) With Pelvis Neutral    Sitting edge of chair, place heel on floor and straighten knee. Lean forward leading with chest. Hold _20-30__ seconds. Do _3-5__ times, each leg, _2-3__ times per day.   Cancer Rehab [phone]

## 2020-03-09 ENCOUNTER — Ambulatory Visit: Payer: Medicare Other | Admitting: Internal Medicine

## 2020-03-10 ENCOUNTER — Other Ambulatory Visit: Payer: Self-pay

## 2020-03-10 ENCOUNTER — Encounter: Payer: Medicare Other | Admitting: Physical Therapy

## 2020-03-10 ENCOUNTER — Ambulatory Visit (INDEPENDENT_AMBULATORY_CARE_PROVIDER_SITE_OTHER): Payer: Medicare Other | Admitting: Internal Medicine

## 2020-03-10 ENCOUNTER — Encounter: Payer: Self-pay | Admitting: Internal Medicine

## 2020-03-10 VITALS — BP 140/80 | HR 74 | Temp 97.9°F | Ht 72.0 in | Wt 189.2 lb

## 2020-03-10 DIAGNOSIS — J449 Chronic obstructive pulmonary disease, unspecified: Secondary | ICD-10-CM

## 2020-03-10 DIAGNOSIS — R490 Dysphonia: Secondary | ICD-10-CM

## 2020-03-10 DIAGNOSIS — G4734 Idiopathic sleep related nonobstructive alveolar hypoventilation: Secondary | ICD-10-CM

## 2020-03-10 DIAGNOSIS — C321 Malignant neoplasm of supraglottis: Secondary | ICD-10-CM

## 2020-03-10 DIAGNOSIS — Z7185 Encounter for immunization safety counseling: Secondary | ICD-10-CM

## 2020-03-10 DIAGNOSIS — Z7189 Other specified counseling: Secondary | ICD-10-CM

## 2020-03-10 DIAGNOSIS — J438 Other emphysema: Secondary | ICD-10-CM

## 2020-03-10 NOTE — Progress Notes (Signed)
OV 03/10/2020  Subjective:  Patient ID: Julian Brown, male , DOB: 06-01-1933 , age 86 y.o. , MRN: [id number] , ADDRESS: [place] [id number]   03/10/2020 -   Chief Complaint  Patient presents with  . Follow-up    no problems     HPI Julian Brown 86 y.o. - OV 04/23/2019  Subjective:  Patient ID: Julian Brown, male , DOB: 09-18-1932 , age 73 y.o. , MRN: [id number] , ADDRESS: [address] [place] [address]   04/23/2019 -   Chief Complaint  Patient presents with  . Follow-up    Pt states he has been doing well since last visit. Pt's wife says that pt has been coughing a lot with white to yellow phlegm and has SOB with activities.   COPD and nodule follow-up  HPI Julian Brown 86 y.o. -presents with his wife.  He is not giving much of a history.  Wife is giving most of the history.  She is very concerned.  She says that in the summer 2020 he decompensated with increased cough and congestion.  Saw primary care physician x-ray was normal.  Does not recollect if he got antibiotics and steroids.  Now for the last few weeks he has had increased cough, congestion, increased urine volume but no change in sputum color or wheezing or shortness of breath.  COPD CAT score is 13 but this is filled by him.  She is reporting weight more symptoms.  He is compliant with the Spiriva and Breo.  In addition she is asking about the impact of seasonal allergies on this and is requesting allergy testing.  He is using nighttime oxygen.  She is asking about having a portable indigent system.  Walking desaturation test was abnormal a few years ago.  But she says the canisters are all heavy.  He is not using portable oxygen.  Do not know why.  They are interested in this  Lung nodule.  This is stable: Results are below.  New issue: Reporting hoarseness of voice since gallbladder surgery in December 2018.  Says saw ENT over a year ago and was told that the vocal  cords were red but no nodules or any other abnormalities.  Does not remember any strictures.    OV 03/10/2020   Subjective:  Patient ID: Julian Brown, male , DOB: 1932-09-16, age 80 y.o. years. , MRN: [id number],  ADDRESS: [place] [address] PCP  Prince Solian, MD Providers : Treatment Team:  Attending Provider: Brand Males, MD   Chief Complaint  Patient presents with  . 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 -presents with his wife.  Wife gives most of the history.  I personally last saw the patient in end of 2020.  After that I referred him to ENT.  Appears he has been diagnosed with supraglottis cancer.  He has seen [facility] nurse practitioner 3 times after that.  As of spring 2020 when he is finished radiation.  His voice continues to be hoarse and does not return back to baseline.  Apparently voice rehabilitation is an option for them.  During all this he is now top taking his Spiriva and Breo.  His wife does  report dyspnea on exertion that may be slightly worse than before.  However is not any inhalers.  Also because of all this he stopped doing exercise program.  He does have arthritis and is somewhat of an antalgic gait but is able to do bike riding on a stationary bike if he can have access to 1.  In addition he is able to do all his ADLs.  He has had his Covid vaccine but currently meets indication for booster.  They have been counseled about it.  CAT COPD Symptom & Quality of Life Score (GSK trademark) 0 is no burden. 5 is highest burden 04/23/2019   Never Cough -> Cough all the time 3  No phlegm in chest -> Chest is full of phlegm 2  No chest tightness -> Chest feels very tight 1  No dyspnea for 1  flight stairs/hill -> Very dyspneic for 1 flight of stairs 4  No limitations for ADL at home -> Very limited with ADL at home 0  Confident leaving home -> Not at all confident leaving home 0  Sleep soundly -> Do not sleep soundly because of lung condition 0  Lots of Energy -> No energy at all 3  TOTAL Score (max 40)  13      Results for Julian, Brown (MRN [id number]) as of 11/20/2017 11:02  Ref. Range 11/20/2017 09:12  FEV1-Post Latest Units: L 1.43  FEV1-%Pred-Post Latest Units: % 54  FEV1-%Change-Post Latest Units: % 5  Post FEV1/FVC ratio Latest Units: % 61  FEV1FVC-%Change-Post Latest Units: % 0  Results for BRANDUN, Brown (MRN [id number]) as of 11/20/2017 11:02  Ref. Range 11/20/2017 09:12  DLCO unc Latest Units: ml/min/mmHg 12.99  DLCO unc % pred Latest Units: % 41    Simple office walk 185 feet x  3 laps goal with forehead probe 10/25/2017  11/20/2017  04/23/2019  O2 used Room air Room air Room air  Number laps completed 2 and desaturated 2 and desaturated 1 and deaurates  Comments about pace Normal with limp Normal pace normla pace  Resting Pulse Ox/HR 96% and 63/min 95% and 61/min 97% and 75/min  Final Pulse Ox/HR 84% and 81/min 87% and 85 at 2nd lap 88% and and 89/min  Desaturated </= 88% yes yes   Desaturated <= 3% points yes yes   Got Tachycardic >/= 90/min no no   Symptoms at end of test No complaints No complaints dyspnea  Miscellaneous comments Corrected with 2L O2 correcte with 2nd lap mild      IMPRESSION: CT chest nov 2020 1. Stable tiny left upper lobe pulmonary nodules. Interval 1 year stability consistent with benign etiology. No new or progressive findings on today's study. 2. Stable bilateral thyroid nodules. 3. Aortic Atherosclerosis (ICD10-I70.0) and Emphysema (ICD10-J43.9).   Electronically Signed   By: Misty Stanley M.D.   On: 04/16/2019 13:06    No flowsheet data found.     ROS - per HPI     has a past medical history of  Acute gangrenous cholecystitis s/p lap cholecystectomy 06/08/2017 (06/08/2017), Arthritis, Colon cancer ([facility]), ED (erectile dysfunction), Emphysema of lung ([facility]), Hyperlipemia, Hypertension, Prostate cancer ([facility]), and Wears glasses.   reports that he quit smoking about 33 years ago. His smoking use included cigarettes. He has a 36.00 pack-year smoking history. He has never used smokeless tobacco.  Past Surgical History:  Procedure Laterality Date  . APPENDECTOMY    . CARPAL TUNNEL RELEASE  5/12   lt  .  CARPAL TUNNEL RELEASE  12/13/2011   Procedure: CARPAL TUNNEL RELEASE;  Surgeon: [name]., MD;  Location: [facility];  Service: Orthopedics;  Laterality: Right;  and inject right wrist  . CHOLECYSTECTOMY N/A 06/08/2017   Procedure: LAPAROSCOPIC CHOLECYSTECTOMY WITH LYSIS OF ADHESIONS;  Surgeon: [name], MD;  Location: [facility];  Service: General;  Laterality: N/A;  . [place]   hemicolectomy-rt-ca  . COLONOSCOPY     about 12 inches of colon removed due to colon cancer  . IR GASTROSTOMY TUBE MOD SED  11/06/2019  . IR GASTROSTOMY TUBE REMOVAL  01/22/2020    Allergies  Allergen Reactions  . Penicillins Swelling    Has patient had a PCN reaction causing immediate rash, facial/tongue/throat swelling, SOB or lightheadedness with hypotension: no (lip swelling "as soon as the pill touched my mouth so I didn't swallow it") Has patient had a PCN reaction causing severe rash involving mucus membranes or skin necrosis: no Has patient had a PCN reaction that required hospitalization: no Has patient had a PCN reaction occurring within the last 10 years: no If all of the above answers are "NO", then may proceed with Ceph    Immunization History  Administered Date(s) Administered  . Fluad Quad(high Dose 65+) 05/14/2019  . Influenza, High Dose Seasonal PF 02/11/2017, 04/18/2018  . PFIZER SARS-COV-2 Vaccination 08/20/2019, 09/10/2019  . Pneumococcal Conjugate-13  11/06/2014    No family history on file.   Current Outpatient Medications:  .  acetaminophen (TYLENOL) 500 MG tablet, Take 1,000 mg by mouth every 6 (six) hours as needed for moderate pain., Disp: , Rfl:  .  amLODipine (NORVASC) 10 MG tablet, Take 10 mg by mouth daily., Disp: , Rfl:  .  aspirin EC 81 MG tablet, Take 81 mg by mouth daily., Disp: , Rfl:  .  BREO ELLIPTA 100-25 MCG/INH AEPB, INHALE 1 PUFF INTO THE LUNGS ONCE DAILY. (Patient taking differently: Inhale 1 puff into the lungs daily. ), Disp: 60 each, Rfl: 5 .  Cholecalciferol (VITAMIN D) 50 MCG (2000 UT) tablet, Take 2,000 Units by mouth daily., Disp: , Rfl:  .  docusate sodium (COLACE) 100 MG capsule, Take 2 capsules (200 mg total) by mouth daily. Take while on Hycet to prevent constipation., Disp: 60 capsule, Rfl: 1 .  fluticasone (FLONASE) 50 MCG/ACT nasal spray, Place 1 spray into both nostrils daily as needed for allergies. , Disp: , Rfl:  .  gabapentin (NEURONTIN) 300 MG capsule, Take 1 capsule (300 mg total) by mouth 3 (three) times daily., Disp: 90 capsule, Rfl: 1 .  HYDROcodone-acetaminophen (HYCET) 7.5-325 mg/15 ml solution, Take 15 mLs by mouth 3 (three) times daily as needed for moderate pain. Take with food., Disp: 473 mL, Rfl: 0 .  lidocaine (XYLOCAINE) 2 % solution, Patient: Mix 1part 2% viscous lidocaine, 1part H20. Swish & swallow 59mL of diluted mixture, 1min before meals and at bedtime, up to QID (Patient taking differently: Use as directed 15 mLs in the mouth or throat See admin instructions. Patient: Mix 1part 2% viscous lidocaine, 1part H20. Swish & swallow 32mL of diluted mixture, 88min before meals and at bedtime as needed mouth/throat pain), Disp: 200 mL, Rfl: 3 .  Multiple Vitamins-Minerals (MULTIVITAMIN WITH MINERALS) tablet, Take 1 tablet by mouth daily., Disp: 10 tablet, Rfl: 1 .  Nutritional Supplements (FEEDING SUPPLEMENT, OSMOLITE 1.5 CAL,) LIQD, Give 1.5 cartons of formula at 8am, noon, 4pm and 8pm.   Flush with 58ml of water before and  after feeding. Will need to drink additional 272ml water or give via tube to meet hydration needs.  Meets 100% of estimated needs.  Send bolus supplies., Disp: 1422 mL, Rfl: 0 .  Nutritional Supplements (PROSOURCE TF) LIQD, Give 107ml BID via feeding tube.  Mix with 30-98ml of water with prosource.  Flush tube with 30-60 ml of water first, then give prosource and water mixture and flush with 30-22ml after., Disp: 60 mL, Rfl: 10 .  olmesartan-hydrochlorothiazide (BENICAR HCT) 40-12.5 MG tablet, Take 1 tablet by mouth daily. , Disp: , Rfl:  .  Polyethyl Glycol-Propyl Glycol (SYSTANE OP), Place 1 drop into both eyes daily as needed (dry eyes)., Disp: , Rfl:  .  potassium & sodium phosphates (SODIUM, POTASSIUM, PHOSPHORUS) 280-160-250 MG PACK, Dissolve in approx. 1/3 cup of water four times a day, take after meals and at bedtime., Disp: 120 each, Rfl: 0 .  rosuvastatin (CRESTOR) 10 MG tablet, Take 10 mg by mouth daily., Disp: , Rfl:  .  sodium fluoride (PREVIDENT 5000 PLUS) 1.1 % CREA dental cream, Apply to tooth brush. Brush teeth for 2 minutes. Spit out excess-DO NOT swallow. Repeat nightly. (Patient taking differently: Place 1 application onto teeth at bedtime. Apply to tooth brush. Brush teeth for 2 minutes. Spit out excess-DO NOT swallow. Repeat nightly.), Disp: 1 Tube, Rfl: prn .  SPIRIVA RESPIMAT 1.25 MCG/ACT AERS, USE 2 PUFFS EACH DAY (Patient taking differently: Inhale 2 puffs into the lungs daily. ), Disp: 4 g, Rfl: 5 .  thiamine 100 MG tablet, Take 1 tablet (100 mg total) by mouth daily., Disp: 10 tablet, Rfl: 1 .  Wound Dressings (SONAFINE EX), Apply 1 application topically daily., Disp: , Rfl:       Objective:   Vitals:   03/10/20 1349  BP: 140/80  Pulse: 74  Temp: 97.9 F (36.6 C)  TempSrc: Temporal  SpO2: 93%  Weight: 189 lb 3.2 oz (85.8 kg)  Height: 6' (1.829 m)    Estimated body mass index is 25.66 kg/m as calculated from the following:    Height as of this encounter: 6' (1.829 m).   Weight as of this encounter: 189 lb 3.2 oz (85.8 kg).  @WEIGHTCHANGE @  [employer]   03/10/20 1349  Weight: 189 lb 3.2 oz (85.8 kg)     Physical Exam Introverted alert and oriented male not giving much of the history clear to auscultation bilaterally significant visceral obesity.  Mildly antalgic stooped forward gait.  Also slightly broad-based.  This reflexes arthritis normal heart sounds visceral obesity present no cyanosis no clubbing no edema.  No stigmata of connective tissue disease.         Assessment:       ICD-10-CM   1. Stage 2 moderate COPD by GOLD classification ([facility])  J44.9   2. Nocturnal hypoxemia  G47.34   3. Malignant neoplasm of supraglottis (HCC)  C32.1   4. Hoarseness of voice  R49.0   5. Vaccine counseling  Z71.89        Plan:     Patient Instructions  Multiple lung nodules on CT - stable jan 2019 to Nov 2019 with new 76mmLUL nodule -> stable Nov 2020 Hx of supraglottis cancer  Plan  - CT chest without contrast in 3 months   Stage 2 moderate COPD by GOLD classification ([facility]) Emphysema Nocturnal hypoxemia  - stable but symptomatic.  You are not on your inhalers spiriva and  breo scheduled  Plan -  Continue  o2 at night  -  restart breo scheduled  - hold off on spiriva restart  - start albuterol as needed  - refer pulmonary rehab (for emphysema)   Hoarse voice - throat cancer s/p XRT Spring 2021  Plan  - per ENT  followup  12 weeks or sooner if needed but after CT chest     SIGNATURE    Dr. Brand Males, M.D., F.C.C.P,  Pulmonary and Critical Care Medicine Staff Physician, [facility] Director - Interstitial Lung Disease  Program  Pulmonary [place] [facility] [place], [place], [address]  Pager: [phone], If no answer or between  15:00h - 7:00h: call [phone] Telephone: [phone]  2:12 PM 03/10/2020

## 2020-03-10 NOTE — Patient Instructions (Addendum)
Multiple lung nodules on CT - stable jan 2019 to Nov 2019 with new 29mmLUL nodule -> stable Nov 2020 Hx of supraglottis cancer  Plan  - CT chest without contrast in 3 months   Stage 2 moderate COPD by GOLD classification ([place]) Emphysema Nocturnal hypoxemia  - stable but symptomatic.  You are not on your inhalers spiriva and  breo scheduled  Plan -  Continue  o2 at night  - restart breo scheduled  - hold off on spiriva restart  - start albuterol as needed  - refer pulmonary rehab (for emphysema)   Hoarse voice - throat cancer s/p XRT Spring 2021  Plan  - per ENT  followup  12 weeks or sooner if needed but after CT chest

## 2020-03-10 NOTE — Addendum Note (Signed)
Addended by: Vanessa Barbara on: 03/10/2020 02:29 PM   Modules accepted: Orders

## 2020-03-11 ENCOUNTER — Telehealth: Payer: Self-pay

## 2020-03-11 ENCOUNTER — Ambulatory Visit (INDEPENDENT_AMBULATORY_CARE_PROVIDER_SITE_OTHER): Payer: Medicare Other | Admitting: Orthopaedic Surgery

## 2020-03-11 ENCOUNTER — Ambulatory Visit: Payer: Self-pay

## 2020-03-11 ENCOUNTER — Encounter: Payer: Self-pay | Admitting: Orthopaedic Surgery

## 2020-03-11 ENCOUNTER — Other Ambulatory Visit: Payer: Self-pay

## 2020-03-11 DIAGNOSIS — M1712 Unilateral primary osteoarthritis, left knee: Secondary | ICD-10-CM

## 2020-03-11 DIAGNOSIS — M1711 Unilateral primary osteoarthritis, right knee: Secondary | ICD-10-CM | POA: Diagnosis not present

## 2020-03-11 DIAGNOSIS — M1612 Unilateral primary osteoarthritis, left hip: Secondary | ICD-10-CM

## 2020-03-11 DIAGNOSIS — M1611 Unilateral primary osteoarthritis, right hip: Secondary | ICD-10-CM | POA: Diagnosis not present

## 2020-03-11 MED ORDER — LIDOCAINE HCL 1 % IJ SOLN
5.0000 mL | INTRAMUSCULAR | Status: AC | PRN
  Administered 2020-03-11: 5 mL

## 2020-03-11 MED ORDER — LIDOCAINE HCL 1 % IJ SOLN
0.5000 mL | INTRAMUSCULAR | Status: AC | PRN
  Administered 2020-03-11: .5 mL

## 2020-03-11 MED ORDER — METHYLPREDNISOLONE ACETATE 40 MG/ML IJ SUSP
40.0000 mg | INTRAMUSCULAR | Status: AC | PRN
  Administered 2020-03-11: 40 mg via INTRA_ARTICULAR

## 2020-03-11 NOTE — Telephone Encounter (Signed)
Noted  

## 2020-03-11 NOTE — Telephone Encounter (Signed)
Bilateral knee gel injections 

## 2020-03-11 NOTE — Progress Notes (Signed)
Office Visit Note   Patient: Julian Brown           Date of Birth: 05/14/1933           MRN: [id number] Visit Date: 03/11/2020              Requested by: Prince Solian, MD [address] [place],  [place] [address] PCP: Prince Solian, MD   Assessment & Plan: Visit Diagnoses:  1. Primary osteoarthritis of left hip   2. Primary osteoarthritis of right knee   3. Primary osteoarthritis of right hip     Plan:  We will continue with conservative treatment given patient's emphysema and his recent treatment within the year for malignant neoplasm of the supraglottis.  We recommended injections of both knees with cortisone today and a intra-articular injection of the right hip with cortisone in the future under ultrasound with Dr. [name].  We will set this up with Dr. [name] and then have him come back for the injection.  In regards to supplemental injections he is done well with these in the past and would recommend repeating these when available.  We will try to gain approval for both knees to have supplemental injections in the near future.  Questions were encouraged and answered by Dr. Ninfa Linden myself.  Follow-Up Instructions: No follow-ups on file.   Orders:  Orders Placed This Encounter  Procedures   Large Joint Inj   XR Knee 1-2 Views Left   XR Knee 1-2 Views Right   XR HIP UNILAT W OR W/O PELVIS 1V RIGHT   No orders of the defined types were placed in this encounter.     Procedures: Large Joint Inj: bilateral knee on 03/11/2020 11:48 AM Indications: pain and joint swelling Details: 22 G 1.5 in needle, superolateral approach  Arthrogram: No  Medications (Right): 0.5 mL lidocaine 1 %; 40 mg methylPREDNISolone acetate 40 MG/ML Medications (Left): 40 mg methylPREDNISolone acetate 40 MG/ML; 5 mL lidocaine 1 % Aspirate (Left): 30 mL yellow and blood-tinged Outcome: tolerated well, no immediate complications Procedure, treatment alternatives, risks and benefits  explained, specific risks discussed. Consent was given by the patient. Immediately prior to procedure a time out was called to verify the correct patient, procedure, equipment, support staff and site/side marked as required. Patient was prepped and draped in the usual sterile fashion.       Clinical Data: No additional findings.   Subjective: Chief Complaint  Patient presents with   Left Knee - Pain   Right Knee - Pain    HPI Julian Brown is a pleasant 86 year old male well-known to Dr. Ninfa Linden service last seen in September 2020 and was given supplemental injection in his left knee which is done well until recently.  He has known osteoarthritis of both knees.  No mechanical symptoms of either knee.  He is having new onset of right hip pain no known injury.  States most of his hip pain on the right is lateral aspect no groin pain.  Feels that this may be coming from his knees.  He denies any fevers or chills.  He does have shortness of breath due to stage II moderate COPD.  Since we saw him he did have treatment for malignant neoplasm of the supraglottis.  Patient is nondiabetic. Review of Systems See HPI.  Objective: Vital Signs: There were no vitals taken for this visit.  Physical Exam General: Well-developed well-nourished male no acute distress mood and affect appropriate. Psych: Alert and oriented x3 Ortho  Exam Bilateral hips he has excellent range of motion left hip without pain.  He has pain with internal and external rotation of the right hip and significantly decreased range of motion with internal and external rotation.  Bilateral knees no abnormal warmth erythema.  Positive effusion left knee.  No instability valgus varus stressing of either knee.  He lacks the last few degrees and "bring his left knee out to full extension.  In both knees flex to beyond 90 degrees.  No gross instability valgus varus stressing of either knee. Specialty Comments:  No specialty comments  available.  Imaging: XR HIP UNILAT W OR W/O PELVIS 1V RIGHT  Result Date: 03/11/2020 AP pelvis lateral view right hip: No acute fractures.  Bilateral hips well located.  Misshapened right hip with slight elongation.  Complete loss of the right hip joint space.  Left hip is well-preserved.  No bony abnormalities otherwise.  XR Knee 1-2 Views Left  Result Date: 03/11/2020 Left knee AP lateral views shows mild narrowing medial compartment.  Mild to moderate patellofemoral changes.  And moderate to severe lateral compartmental changes.  No acute fracture.  Knee is well located.  XR Knee 1-2 Views Right  Result Date: 03/11/2020 Right knee 2 views: Tricompartmental arthritis with near bone-on-bone medial compartment.  Mild lateral compartmental changes.  Moderate patellofemoral changes.  Knee is well located.    PMFS History: Patient Active Problem List   Diagnosis Date Noted   Malignant neoplasm of supraglottis ([facility]) 08/08/2019   Hoarseness of voice 06/12/2019   Cough 05/23/2019   Primary osteoarthritis of right wrist 07/31/2018   Stage 2 moderate COPD by GOLD classification ([facility]) 05/16/2018   Allergic rhinitis 05/16/2018   Nocturnal hypoxemia 05/16/2018   Abnormal finding on lung imaging 05/16/2018   Hypertension    Hyperlipemia    Acute gangrenous cholecystitis s/p lap cholecystectomy 06/08/2017 06/08/2017   Pain in right wrist 01/04/2017   Chronic pain of left knee 01/04/2017   Chronic pain of right knee 01/04/2017   Unilateral primary osteoarthritis, left knee 01/04/2017   Unilateral primary osteoarthritis, right knee 01/04/2017   Past Medical History:  Diagnosis Date   Acute gangrenous cholecystitis s/p lap cholecystectomy 06/08/2017 06/08/2017   Arthritis    Colon cancer [facility])    ED (erectile dysfunction)    Emphysema of lung ([facility])    Hyperlipemia    Hypertension    Prostate cancer ([facility])    prostate cancer-tx radiation   Wears glasses       History reviewed. No pertinent family history.  Past Surgical History:  Procedure Laterality Date   APPENDECTOMY     CARPAL TUNNEL RELEASE  5/12   lt   CARPAL TUNNEL RELEASE  12/13/2011   Procedure: CARPAL TUNNEL RELEASE;  Surgeon: [name]., MD;  Location: [facility];  Service: Orthopedics;  Laterality: Right;  and inject right wrist   CHOLECYSTECTOMY N/A 06/08/2017   Procedure: LAPAROSCOPIC CHOLECYSTECTOMY WITH LYSIS OF ADHESIONS;  Surgeon: [name], MD;  [facility];  Service: General;  Laterality: N/A;   [place]   hemicolectomy-rt-ca   COLONOSCOPY     about 12 inches of colon removed due to colon cancer   IR GASTROSTOMY TUBE MOD SED  11/06/2019   IR GASTROSTOMY TUBE REMOVAL  01/22/2020   Social History   Occupational History   Not on file  Tobacco Use   Smoking status: Former Smoker    Packs/day: 1.00    Years: 36.00  Pack years: 36.00    Types: Cigarettes    Quit date: 12/09/1986    Years since quitting: 33.2   Smokeless tobacco: Never Used  Vaping Use   Vaping Use: Never used  Substance and Sexual Activity   Alcohol use: Yes    Comment: occ   Drug use: No   Sexual activity: Not on file

## 2020-03-11 NOTE — Telephone Encounter (Signed)
Can you schedule him and injection with [facility] for right hip

## 2020-03-12 ENCOUNTER — Ambulatory Visit: Payer: Medicare Other | Admitting: Physical Therapy

## 2020-03-12 ENCOUNTER — Other Ambulatory Visit: Payer: Self-pay

## 2020-03-12 DIAGNOSIS — R293 Abnormal posture: Secondary | ICD-10-CM

## 2020-03-12 DIAGNOSIS — M6281 Muscle weakness (generalized): Secondary | ICD-10-CM

## 2020-03-12 DIAGNOSIS — Z9181 History of falling: Secondary | ICD-10-CM

## 2020-03-12 DIAGNOSIS — I890 Lymphedema, not elsewhere classified: Secondary | ICD-10-CM

## 2020-03-12 NOTE — Therapy (Signed)
[place], [place], [address] Phone: [phone]   Fax:  [phone]  Physical Therapy Treatment  Patient Details  Name: Julian Brown MRN: [id number] Date of Birth: [birth year] Referring Provider (PT): Dr. Isidore Moos   Encounter Date: 03/12/2020   PT End of Session - 03/12/20 1757    Visit Number 5    Number of Visits 17    Date for PT Re-Evaluation 04/13/20    PT Start Time 1500    PT Stop Time 1545    PT Time Calculation (min) 45 min    Activity Tolerance Patient tolerated treatment well    Behavior During Therapy [facility] for tasks assessed/performed           Past Medical History:  Diagnosis Date  . Acute gangrenous cholecystitis s/p lap cholecystectomy 06/08/2017 06/08/2017  . Arthritis   . Colon cancer ([place])   . ED (erectile dysfunction)   . Emphysema of lung ([place])   . Hyperlipemia   . Hypertension   . Prostate cancer [facility])    prostate cancer-tx radiation  . Wears glasses     Past Surgical History:  Procedure Laterality Date  . APPENDECTOMY    . CARPAL TUNNEL RELEASE  5/12   lt  . CARPAL TUNNEL RELEASE  12/13/2011   Procedure: CARPAL TUNNEL RELEASE;  Surgeon: [name]., MD;  Location: [place];  Service: Orthopedics;  Laterality: Right;  and inject right wrist  . CHOLECYSTECTOMY N/A 06/08/2017   Procedure: LAPAROSCOPIC CHOLECYSTECTOMY WITH LYSIS OF ADHESIONS;  Surgeon: [name], MD;  [facility];  Service: General;  Laterality: N/A;  . [place]   hemicolectomy-rt-ca  . COLONOSCOPY     about 12 inches of colon removed due to colon cancer  . IR GASTROSTOMY TUBE MOD SED  11/06/2019  . IR GASTROSTOMY TUBE REMOVAL  01/22/2020    There were no vitals filed for this visit.   Subjective Assessment - 03/12/20 1509    Subjective Pt said that he got an injection of both knees yesterday and he is feeling immedicate relief.  He does have arthritis  in his right hip and will go to see Dr [name] about his hip next week    Patient is accompained by: Family member    Pertinent History OA bil knees, HTN, high cholesterol, stage 2 COPD, Pt  diagnosed with Larynx Cancer  completed 35 radiation session  on April 27 .   SUSPENSION MICRO DIRECT LARYNGOSCOPY W/ CO2 LASER on 07/12/19 at [facility] Pt had PEG removed on 01/22/2020    Patient Stated Goals to and to get stronger, anything to help him    Currently in Pain? Yes    Pain Score 7     Pain Location Hip    Pain Orientation Right    Aggravating Factors  hurts when he walks    Pain Relieving Factors doesn't hurt  when he is just sitting there.              [facility] PT Assessment - 03/12/20 0001      Assessment   Medical Diagnosis larynx cancer    Referring Provider (PT) Dr. Isidore Moos    Onset Date/Surgical Date 07/12/19      Prior Function   Level of Independence Independent             LYMPHEDEMA/ONCOLOGY QUESTIONNAIRE - 03/12/20 0001      Head and Neck   8 cm  superior to sternal notch around neck 40.6 cm    Other 24.2                      [facility] Adult PT Treatment/Exercise - 03/12/20 0001      Neck Exercises: Seated   Other Seated Exercise neck flexion, extension, with "kiss the sky", rotation, lateral felxion and upper trunk felxion, extension and rotation       Manual Therapy   Manual Therapy Edema management;Manual Lymphatic Drainage (MLD)    Manual therapy comments Heard back from [facility] and pt does not have insurance coverage for comprssion garment     Edema Management encouraged pt to wear his chip pack at home,  Showed pt the [place] to consider purchasing and will discuss next session. The do not have a working computer so will need assistance ordering it,     Manual Lymphatic Drainage (MLD) with pt in sitting performed, diaphragmatic breaths, bilateral and axillary nodes, anterior neck and chest, posterior neck and shoulders Pt instructed with hand overy  hand instruction to "pin" at left lateral neck and stretch skin  below clavicles Softening of firmness at neck observed with this technique                   PT Education - 03/12/20 1749    Education Details options for compression garment    Person(s) Educated Patient;Spouse    Methods Explanation    Comprehension Verbalized understanding               PT Long Term Goals - 03/12/20 1754      PT LONG TERM GOAL #1   Title Pt and wife will report that know how to manage lymphedema with manual lymph drainage and compression    Time 4    Period Weeks    Status On-going      PT LONG TERM GOAL #2   Title Pt will be independent in a home exercise program for improved strength and balance    Time 4    Period Weeks    Status On-going      PT LONG TERM GOAL #3   Title Pt will decrease TUG score to < 11 to demonstrate increased functional mobility in gait    Baseline 14.09 on 02/06/2020    Time 4    Period Weeks    Status On-going                 Plan - 03/12/20 1750    Clinical Impression Statement Pt is receiving benfit from MLD with visible and palpable softening of fullness at neck with treatment.  He hasn't been wearing his chip pack as much this week as he has been focusing on orthopedic issues but will use it this weekend and determine if he wants to purchase a [employer].  He wants to impove his mobility and is hopeful he will be better able to move after he gets treatment for his hip next week. Recert sent as goals have not been fully met and pt wants to continue    Personal Factors and Comorbidities Comorbidity 3+;Comorbidity 1    Comorbidities COPD, arthritis in knees, radiation treatment to larynx    Examination-Activity Limitations Locomotion Level;Lift;Transfers;Stand    Stability/Clinical Decision Making Stable/Uncomplicated    Rehab Potential Excellent    PT Frequency 2x / week    PT Duration 4 weeks    PT Treatment/Interventions  ADLs/Self Care Home Management;Gait  training;Stair training;Functional mobility training;Therapeutic activities;Therapeutic exercise;Balance training;Orthotic Fit/Training;Patient/family education;Manual techniques;Manual lymph drainage    PT Next Visit Plan review  MLD and assess chip pack/Measure and  Assist with getting compression garment if pt wants it ( long neck marena )  Review HEP issued today to assess technique, progress UE/LE strengthening exs it gait and balance work    PT Home Exercise Plan Bil knee/quad strength and HS flexibility    Consulted and Agree with Plan of Care Patient;Family member/caregiver    Family Member Consulted wife           Patient will benefit from skilled therapeutic intervention in order to improve the following deficits and impairments:  Abnormal gait, Decreased balance, Decreased endurance, Decreased mobility, Difficulty walking, Postural dysfunction, Increased edema, Decreased cognition  Visit Diagnosis: Abnormal posture - Plan: PT plan of care cert/re-cert  Lymphedema, not elsewhere classified - Plan: PT plan of care cert/re-cert  At risk for falls - Plan: PT plan of care cert/re-cert  Muscle weakness (generalized) - Plan: PT plan of care cert/re-cert     Problem List Patient Active Problem List   Diagnosis Date Noted  . Malignant neoplasm of supraglottis ([place]) 08/08/2019  . Hoarseness of voice 06/12/2019  . Cough 05/23/2019  . Primary osteoarthritis of right wrist 07/31/2018  . Stage 2 moderate COPD by GOLD classification ([place]) 05/16/2018  . Allergic rhinitis 05/16/2018  . Nocturnal hypoxemia 05/16/2018  . Abnormal finding on lung imaging 05/16/2018  . Hypertension   . Hyperlipemia   . Acute gangrenous cholecystitis s/p lap cholecystectomy 06/08/2017 06/08/2017  . Pain in right wrist 01/04/2017  . Chronic pain of left knee 01/04/2017  . Chronic pain of right knee 01/04/2017  . Unilateral primary osteoarthritis, left knee 01/04/2017   . Unilateral primary osteoarthritis, right knee 01/04/2017   Donato Heinz. Owens Shark PT  Norwood Levo 03/12/2020, 5:58 PM  [place], [place], [address] Phone: [phone]   Fax:  [phone]  Name: Julian Brown MRN: [id number] Date of Birth: Mar 17, 1933

## 2020-03-13 ENCOUNTER — Encounter (HOSPITAL_COMMUNITY): Payer: Self-pay | Admitting: *Deleted

## 2020-03-13 NOTE — Progress Notes (Signed)
Received referral from Dr. Chase Caller for this pt to participate in pulmonary rehab with the the diagnosis of Other Emphysema. Clinical review of pt follow up appt on 9/28 Pulmonary office note.  Pt with Covid Risk Score - 6. Pt has had  injections for his bilat knee osteoarthritis.  Pt scheduled for injection to his right hip on 10/6.  Pt undergoing PT.  Medicare pt can not attend two competing therapies/rehab concurrently.  Pt will have to complete PT prior to participating in pulmonary rehab.  Unclear of how much longer as re certification was sent for authorization for continued therapy sessions.  Presently we are scheduling for November, this would give him time for additional therapy sessions as well as time for the full benefit of the injection. Will forward  verification of insurance eligibility/benefits with pt consent and intial call. Cherre Huger, BSN Cardiac and Training and development officer

## 2020-03-17 ENCOUNTER — Other Ambulatory Visit: Payer: Self-pay

## 2020-03-17 ENCOUNTER — Ambulatory Visit: Payer: Medicare Other | Attending: Radiation Oncology

## 2020-03-17 DIAGNOSIS — M6281 Muscle weakness (generalized): Secondary | ICD-10-CM | POA: Insufficient documentation

## 2020-03-17 DIAGNOSIS — Z9181 History of falling: Secondary | ICD-10-CM | POA: Diagnosis present

## 2020-03-17 DIAGNOSIS — R293 Abnormal posture: Secondary | ICD-10-CM | POA: Insufficient documentation

## 2020-03-17 DIAGNOSIS — I890 Lymphedema, not elsewhere classified: Secondary | ICD-10-CM | POA: Insufficient documentation

## 2020-03-17 NOTE — Therapy (Signed)
[place], [place], [address] Phone: [phone]   Fax:  [phone]  Physical Therapy Treatment  Patient Details  Name: Julian Brown MRN: [id number] Date of Birth: Apr 17, 1933 Referring Provider (PT): Dr. Isidore Moos   Encounter Date: 03/17/2020   PT End of Session - 03/17/20 1222    Visit Number 6    Number of Visits 17    Date for PT Re-Evaluation 04/13/20    PT Start Time 1107    PT Stop Time 1212    PT Time Calculation (min) 65 min    Activity Tolerance Patient tolerated treatment well    Behavior During Therapy [facility] for tasks assessed/performed           Past Medical History:  Diagnosis Date  . Acute gangrenous cholecystitis s/p lap cholecystectomy 06/08/2017 06/08/2017  . Arthritis   . Colon cancer ([place])   . ED (erectile dysfunction)   . Emphysema of lung ([place])   . Hyperlipemia   . Hypertension   . Prostate cancer [facility])    prostate cancer-tx radiation  . Wears glasses     Past Surgical History:  Procedure Laterality Date  . APPENDECTOMY    . CARPAL TUNNEL RELEASE  5/12   lt  . CARPAL TUNNEL RELEASE  12/13/2011   Procedure: CARPAL TUNNEL RELEASE;  Surgeon: [name]., MD;  Location: [facility];  Service: Orthopedics;  Laterality: Right;  and inject right wrist  . CHOLECYSTECTOMY N/A 06/08/2017   Procedure: LAPAROSCOPIC CHOLECYSTECTOMY WITH LYSIS OF ADHESIONS;  Surgeon: [name], MD;  [facility];  Service: General;  Laterality: N/A;  . [place]   hemicolectomy-rt-ca  . COLONOSCOPY     about 12 inches of colon removed due to colon cancer  . IR GASTROSTOMY TUBE MOD SED  11/06/2019  . IR GASTROSTOMY TUBE REMOVAL  01/22/2020    There were no vitals filed for this visit.   Subjective Assessment - 03/17/20 1118    Subjective They found out I have arthritis in my Lt hip and I'm going to get a shot tomorrow (Dr. [name]). My throat/swelling is  doing alot better and I can eat now!    Pertinent History OA bil knees, HTN, high cholesterol, stage 2 COPD, Pt  diagnosed with Larynx Cancer  completed 35 radiation session  on April 27 .   SUSPENSION MICRO DIRECT LARYNGOSCOPY W/ CO2 LASER on 07/12/19 at [facility] Pt had PEG removed on 01/22/2020    Patient Stated Goals to and to get stronger, anything to help him    Currently in Pain? No/denies                             [facility] Adult PT Treatment/Exercise - 03/17/20 0001      Self-Care   Self-Care Other Self-Care Comments    Other Self-Care Comments  Answered pts and wifes questions about Marena long neck garment. Maudry Diego, PT began educating them about this at last session, so today issued handout with picture, information about the garment, and how to order this online which they said their daughter could help them with. For now he plans to continue using the chip pack and plans to do so more consistently. Will order the garment if he feels his reductions plateau. Also discussed future appointments and pt would like to come once a week 2 more times to give himself more time  to work on becoming compliant with HEP and self MLD, working towards potential D//C at the end of the month.       Exercises   Other Exercises  Briefly verbally and with return demo reviewed HEP that was issued on 03/05/20 as pt reports hadn't gotten to do this yet at home, and had him return demo of HS stretch as he reports not remembering this ex. Multiple VCs after demo for correct technique and to hold stretch      Manual Therapy   Manual Lymphatic Drainage (MLD) In supine per new handout issued today, see education section. Had pt return demo of each sequence which he still struggles with technique overall, but did show some improvement with direction of skin stretches at throat, was still doing complete circles, not stationary, on face.                   PT Education - 03/17/20 1217     Education Details Reprinted LE strength and flexibility handout from 03/05/20 per pt request and briefly reviewed these with pt, then also reprinted MLD sequence for pt as he requested another, used simplified version of MLD as pt is still struggling with steps    Person(s) Educated Patient;Spouse    Methods Explanation;Demonstration;Verbal cues;Handout    Comprehension Verbalized understanding;Returned demonstration;Tactile cues required;Need further instruction               PT Long Term Goals - 03/17/20 1137      PT LONG TERM GOAL #1   Title Pt and wife will report that know how to manage lymphedema with manual lymph drainage and compression    Baseline Pt still requires review with self MLD for correct technique, is independent with chip pack wear and has been issued info for [place] if they would like to pursue this-03/17/20    Status Partially Met      PT LONG TERM GOAL #2   Title Pt will be independent in a home exercise program for improved strength and balance    Baseline Pt has been inssued HEP for bil LE strength but reports has yet to be compliant with this-03/17/20    Status Partially Met      PT LONG TERM GOAL #3   Title Pt will decrease TUG score to < 11 to demonstrate increased functional mobility in gait    Baseline 14.09 on 02/06/2020; will retest this after hip injection on 03/18/20    Status On-going                 Plan - 03/17/20 1226    Clinical Impression Statement Spent beginning of session educating pt and wife about Marena head/neck compression garment, answering their questions throughout (see flowsheet). Also discussed current progress and how he would like to return for at least 2 more sessions working towards finalizing HEP and reviewing self MLD technique. Pt gets shot in Lt hip tomorrow with Dr. [name] so will benefit from further physical therapy to incorporate hip strength as his pain wil potentially be less, add UE strength to HEP and to  reassess self MLD.    Personal Factors and Comorbidities Comorbidity 3+;Comorbidity 1    Comorbidities COPD, arthritis in knees, radiation treatment to larynx    Examination-Activity Limitations Locomotion Level;Lift;Transfers;Stand    Stability/Clinical Decision Making Stable/Uncomplicated    Rehab Potential Excellent    PT Frequency 2x / week    PT Duration 4 weeks    PT Treatment/Interventions ADLs/Self  Care Home Management;Gait training;Stair training;Functional mobility training;Therapeutic activities;Therapeutic exercise;Balance training;Orthotic Fit/Training;Patient/family education;Manual techniques;Manual lymph drainage    PT Next Visit Plan Retest TUG, review self MLD, see if any questions about current LE strength HEP (is he more compliant now?), and progress HEP to include hip 3 way and UE strength. Working towards D/C in next 2 sessions.    PT Home Exercise Plan Bil knee/quad strength and HS flexibility; daily self MLD and wear chip pack    Consulted and Agree with Plan of Care Patient;Family member/caregiver    Family Member Consulted wife           Patient will benefit from skilled therapeutic intervention in order to improve the following deficits and impairments:  Abnormal gait, Decreased balance, Decreased endurance, Decreased mobility, Difficulty walking, Postural dysfunction, Increased edema, Decreased cognition  Visit Diagnosis: Abnormal posture  Lymphedema, not elsewhere classified  At risk for falls  Muscle weakness (generalized)     Problem List Patient Active Problem List   Diagnosis Date Noted  . Malignant neoplasm of supraglottis ([place]) 08/08/2019  . Hoarseness of voice 06/12/2019  . Cough 05/23/2019  . Primary osteoarthritis of right wrist 07/31/2018  . Stage 2 moderate COPD by GOLD classification ([place]) 05/16/2018  . Allergic rhinitis 05/16/2018  . Nocturnal hypoxemia 05/16/2018  . Abnormal finding on lung imaging 05/16/2018  . Hypertension   .  Hyperlipemia   . Acute gangrenous cholecystitis s/p lap cholecystectomy 06/08/2017 06/08/2017  . Pain in right wrist 01/04/2017  . Chronic pain of left knee 01/04/2017  . Chronic pain of right knee 01/04/2017  . Unilateral primary osteoarthritis, left knee 01/04/2017  . Unilateral primary osteoarthritis, right knee 01/04/2017    Otelia Limes, PTA 03/17/2020, 12:32 PM  [place], [place], [address] Phone: [phone]   Fax:  [phone]  Name: Julian Brown MRN: [id number] Date of Birth: 03-30-1933

## 2020-03-17 NOTE — Patient Instructions (Signed)
Manual Lymph Drainage for face/neck  1) Place hands on either side of neck and do 15-20 stationary circles  2) Place hands on areas just above collar bones and do 15-20 stationary circles 3) Do stationary circles on both shoulders (about 10 times) 4) Do stationary circles at both armpit areas (about 10 times) 5) Take 5 deep breaths 6) Standing at the head of the bed, do stationary circles on each side of the neck just below the jaw line (15-20) 7) Standing at the head of the bed, do downward circles just underneath the chin (15-20) 8) Standing at the head of the bed, do stationary circles on each side of the face on the cheeks just above the jaw line (15-20) 9) Standing at the head of the bed, do stationary downward circles on each side of the face between the eyes and ears (15-20) 10) Repeat step 1, 2, 3, and 4  Cancer Rehab [phone]    Do not slide on the skin Only give enough pressure to stretch the skin Make sure to always wash your hands prior to massage

## 2020-03-18 ENCOUNTER — Ambulatory Visit: Payer: Self-pay

## 2020-03-18 ENCOUNTER — Ambulatory Visit (INDEPENDENT_AMBULATORY_CARE_PROVIDER_SITE_OTHER): Payer: Medicare Other | Admitting: Family Medicine

## 2020-03-18 ENCOUNTER — Encounter: Payer: Self-pay | Admitting: Family Medicine

## 2020-03-18 DIAGNOSIS — M1611 Unilateral primary osteoarthritis, right hip: Secondary | ICD-10-CM | POA: Diagnosis not present

## 2020-03-18 NOTE — Progress Notes (Signed)
I saw and examined the patient with Dr. Elouise Munroe and agree with assessment and plan as outlined.    Right hip injection given with good results.

## 2020-03-18 NOTE — Progress Notes (Signed)
Office Visit Note   Patient: Julian Brown           Date of Birth: 1933/02/05           MRN: [id number] Visit Date: 03/18/2020 Requested by: Prince Solian, MD [address] [place],  [place] [address] PCP: Prince Solian, MD  Subjective: Chief Complaint  Patient presents with  . Right Hip - Pain    Intra-articular cortisone injection per Dr. Ninfa Linden    HPI: 86yo M presenting to clinic for R hip intraarticular injection. Patient has been seen by Dr Ninfa Linden, who recommended trying a hip injection for his R hip OA. Patient has no acute concerns today.               ROS:   All other systems were reviewed and are negative.  Objective: Vital Signs: There were no vitals taken for this visit.  Physical Exam:  General:  Alert and oriented, in no acute distress. Pulm:  Breathing unlabored. Psy:  Normal mood, congruent affect. Skin:  R inguinal area skin intact, no bruising, rashes, or erythema.   R Hip with no gross deformity. Ambulates with slowed gait.  R Leg NVI  Imaging: No results found.  Assessment & Plan: 86 yo M presenting to clinic for R hip Intraarticular injection. Procedure performed as described below, which patient tolerated very well. Voice improvement in his symptoms following injection therapy.  Return precautions were discussed.      Procedures: Right Hip Cortisone Injection:  Risks and benefits of procedure discussed, Patient opted to proceed. Verbal Consent obtained.  Timeout performed.  Skin prepped in a sterile fashion with betadine before further cleansing with alcohol. Ethyl Chloride was used for topical analgesia.  Right Hip was injected with 5cc 1% Lidocaine without epinephrine under ultrasound guidance, using a spinal needle. Syringe was removed from the needle, and 40mg  methylprednisolone was then injected into the joint.   Patient tolerated the injection well with no immediate complications. Aftercare instructions were discussed, and  patient was given strict return precautions.      PMFS History: Patient Active Problem List   Diagnosis Date Noted  . Malignant neoplasm of supraglottis ([place]) 08/08/2019  . Hoarseness of voice 06/12/2019  . Cough 05/23/2019  . Primary osteoarthritis of right wrist 07/31/2018  . Stage 2 moderate COPD by GOLD classification ([place]) 05/16/2018  . Allergic rhinitis 05/16/2018  . Nocturnal hypoxemia 05/16/2018  . Abnormal finding on lung imaging 05/16/2018  . Hypertension   . Hyperlipemia   . Acute gangrenous cholecystitis s/p lap cholecystectomy 06/08/2017 06/08/2017  . Pain in right wrist 01/04/2017  . Chronic pain of left knee 01/04/2017  . Chronic pain of right knee 01/04/2017  . Unilateral primary osteoarthritis, left knee 01/04/2017  . Unilateral primary osteoarthritis, right knee 01/04/2017   Past Medical History:  Diagnosis Date  . Acute gangrenous cholecystitis s/p lap cholecystectomy 06/08/2017 06/08/2017  . Arthritis   . Colon cancer ([place])   . ED (erectile dysfunction)   . Emphysema of lung ([place])   . Hyperlipemia   . Hypertension   . Prostate cancer [facility])    prostate cancer-tx radiation  . Wears glasses     No family history on file.  Past Surgical History:  Procedure Laterality Date  . APPENDECTOMY    . CARPAL TUNNEL RELEASE  5/12   lt  . CARPAL TUNNEL RELEASE  12/13/2011   Procedure: CARPAL TUNNEL RELEASE;  Surgeon: [name]., MD;  Location: [facility];  Service: Orthopedics;  Laterality: Right;  and inject right wrist  . CHOLECYSTECTOMY N/A 06/08/2017   Procedure: LAPAROSCOPIC CHOLECYSTECTOMY WITH LYSIS OF ADHESIONS;  Surgeon: [name], MD;  [facility];  Service: General;  Laterality: N/A;  . [place]   hemicolectomy-rt-ca  . COLONOSCOPY     about 12 inches of colon removed due to colon cancer  . IR GASTROSTOMY TUBE MOD SED  11/06/2019  . IR GASTROSTOMY TUBE REMOVAL  01/22/2020   Social History   Occupational  History  . Not on file  Tobacco Use  . Smoking status: Former Smoker    Packs/day: 1.00    Years: 36.00    Pack years: 36.00    Types: Cigarettes    Quit date: 12/09/1986    Years since quitting: 33.2  . Smokeless tobacco: Never Used  Vaping Use  . Vaping Use: Never used  Substance and Sexual Activity  . Alcohol use: Yes    Comment: occ  . Drug use: No  . Sexual activity: Not on file

## 2020-03-20 ENCOUNTER — Telehealth: Payer: Self-pay

## 2020-03-20 NOTE — Telephone Encounter (Signed)
Submitted VOB, Monovisc, bilateral knee. 

## 2020-04-01 ENCOUNTER — Other Ambulatory Visit: Payer: Self-pay

## 2020-04-01 ENCOUNTER — Ambulatory Visit: Payer: Medicare Other

## 2020-04-01 DIAGNOSIS — Z9181 History of falling: Secondary | ICD-10-CM

## 2020-04-01 DIAGNOSIS — R293 Abnormal posture: Secondary | ICD-10-CM | POA: Diagnosis not present

## 2020-04-01 DIAGNOSIS — M6281 Muscle weakness (generalized): Secondary | ICD-10-CM

## 2020-04-01 DIAGNOSIS — I890 Lymphedema, not elsewhere classified: Secondary | ICD-10-CM

## 2020-04-01 NOTE — Therapy (Signed)
[place], [place], [address] Phone: [phone]   Fax:  [phone]  Physical Therapy Treatment  Patient Details  Name: Julian Brown MRN: [id number] Date of Birth: 1932/11/15 Referring Provider (PT): Dr. Isidore Moos   Encounter Date: 04/01/2020   PT End of Session - 04/01/20 1237    Visit Number 7    Number of Visits 17    Date for PT Re-Evaluation 04/13/20    PT Start Time 1105    PT Stop Time 1208    PT Time Calculation (min) 63 min    Activity Tolerance Patient tolerated treatment well    Behavior During Therapy [name] for tasks assessed/performed           Past Medical History:  Diagnosis Date  . Acute gangrenous cholecystitis s/p lap cholecystectomy 06/08/2017 06/08/2017  . Arthritis   . Colon cancer ([place])   . ED (erectile dysfunction)   . Emphysema of lung ([place])   . Hyperlipemia   . Hypertension   . Prostate cancer [facility])    prostate cancer-tx radiation  . Wears glasses     Past Surgical History:  Procedure Laterality Date  . APPENDECTOMY    . CARPAL TUNNEL RELEASE  5/12   lt  . CARPAL TUNNEL RELEASE  12/13/2011   Procedure: CARPAL TUNNEL RELEASE;  Surgeon: [name]., MD;  Location: [place];  Service: Orthopedics;  Laterality: Right;  and inject right wrist  . CHOLECYSTECTOMY N/A 06/08/2017   Procedure: LAPAROSCOPIC CHOLECYSTECTOMY WITH LYSIS OF ADHESIONS;  Surgeon: [name], MD;  [facility];  Service: General;  Laterality: N/A;  . [place]   hemicolectomy-rt-ca  . COLONOSCOPY     about 12 inches of colon removed due to colon cancer  . IR GASTROSTOMY TUBE MOD SED  11/06/2019  . IR GASTROSTOMY TUBE REMOVAL  01/22/2020    There were no vitals filed for this visit.   Subjective Assessment - 04/01/20 1120    Subjective I saw Dr. Junius Brown 2 weeks ago and he gave me a cortisone shot in my Lt hip and that's been doing better sinceI was here  last. I think I'm doing better but overall could use review for the massage at ym throat, and the leg exercises.    Pertinent History OA bil knees, HTN, high cholesterol, stage 2 COPD, Pt  diagnosed with Larynx Cancer  completed 35 radiation session  on April 27 .   SUSPENSION MICRO DIRECT LARYNGOSCOPY W/ CO2 LASER on 07/12/19 at [facility] Pt had PEG removed on 01/22/2020    Patient Stated Goals to and to get stronger, anything to help him    Currently in Pain? No/denies              [facility] PT Assessment - 04/01/20 0001      Timed Up and Go Test   Normal TUG (seconds) 13   took 3-4 sec at end to sit slowly/safely                        [facility] Adult PT Treatment/Exercise - 04/01/20 0001      Exercises   Other Exercises  Reviewed with demo and pt returning demo of HEP re-issued at last session: seated bil LAQ, marching, and SLR; also reviewed piriformis and HS stretches      Shoulder Exercises: Standing   Other Standing Exercises Bil UE 3 way raises (flexion, scaption, and  abduction to shoulder height) with back against wall/core engaged and shoulders against wall (unable to get head on wall due to postural changes): x10 each with returning therapist demo, some pain reported at end of abduction so cautioned to limit A/ROM here and to not push into pain.       Manual Therapy   Manual Lymphatic Drainage (MLD) In supine with HOB elevated per handout reissued today: Short neck, SCF, bil shoulder collectors, bil axillary nodes, anterior throat, submental nodes, bil cheeks and bil temporal nodes, then retraced all steps having pt return demo of each sequence which he still struggles with technique overall, but continued to some improvement with direction of skin stretches at throat, was still doing complete circles, not stationary.                        PT Long Term Goals - 03/17/20 1137      PT LONG TERM GOAL #1   Title Pt and wife will report that know how to manage  lymphedema with manual lymph drainage and compression    Baseline Pt still requires review with self MLD for correct technique, is independent with chip pack wear and has been issued info for [place] if they would like to pursue this-03/17/20    Status Partially Met      PT LONG TERM GOAL #2   Title Pt will be independent in a home exercise program for improved strength and balance    Baseline Pt has been inssued HEP for bil LE strength but reports has yet to be compliant with this-03/17/20    Status Partially Met      PT LONG TERM GOAL #3   Title Pt will decrease TUG score to < 11 to demonstrate increased functional mobility in gait    Baseline 14.09 on 02/06/2020; will retest this after hip injection on 03/18/20    Status On-going                 Plan - 04/01/20 1237    Clinical Impression Statement Reviewed HEP reissued at last session and progressed to include bil UE 3 way raises. Then spent rest of session reviewing self MLD with pt and wife. He still struggles with correct skin stretch though did show some improvement with technique. Pt will benefit from further review at next session of self MLD technique and to reassess newly issued HEP. Retested TUG and pt did better with this overall, but took 3-4 sec to sit down at end after 3 trials so this showed only a 1 sec improvement.    Personal Factors and Comorbidities Comorbidity 3+;Comorbidity 1    Comorbidities COPD, arthritis in knees, radiation treatment to larynx    Examination-Activity Limitations Locomotion Level;Lift;Transfers;Stand    Stability/Clinical Decision Making Stable/Uncomplicated    Rehab Potential Excellent    PT Frequency 2x / week    PT Duration 4 weeks    PT Treatment/Interventions ADLs/Self Care Home Management;Gait training;Stair training;Functional mobility training;Therapeutic activities;Therapeutic exercise;Balance training;Orthotic Fit/Training;Patient/family education;Manual techniques;Manual lymph  drainage    PT Next Visit Plan Review self MLD, see if any questions about current LE strength HEP (is he more compliant now?). Plan to D/C next.    PT Home Exercise Plan Bil knee/quad strength and HS flexibility; daily self MLD and wear chip pack    Consulted and Agree with Plan of Care Patient;Family member/caregiver    Family Member Consulted wife  Patient will benefit from skilled therapeutic intervention in order to improve the following deficits and impairments:  Abnormal gait, Decreased balance, Decreased endurance, Decreased mobility, Difficulty walking, Postural dysfunction, Increased edema, Decreased cognition  Visit Diagnosis: Abnormal posture  Lymphedema, not elsewhere classified  At risk for falls  Muscle weakness (generalized)     Problem List Patient Active Problem List   Diagnosis Date Noted  . Malignant neoplasm of supraglottis ([place]) 08/08/2019  . Hoarseness of voice 06/12/2019  . Cough 05/23/2019  . Primary osteoarthritis of right wrist 07/31/2018  . Stage 2 moderate COPD by GOLD classification ([place]) 05/16/2018  . Allergic rhinitis 05/16/2018  . Nocturnal hypoxemia 05/16/2018  . Abnormal finding on lung imaging 05/16/2018  . Hypertension   . Hyperlipemia   . Acute gangrenous cholecystitis s/p lap cholecystectomy 06/08/2017 06/08/2017  . Pain in right wrist 01/04/2017  . Chronic pain of left knee 01/04/2017  . Chronic pain of right knee 01/04/2017  . Unilateral primary osteoarthritis, left knee 01/04/2017  . Unilateral primary osteoarthritis, right knee 01/04/2017    Otelia Limes, PTA 04/01/2020, 12:49 PM  [place], [place], [address] Phone: [phone]   Fax:  [phone]  Name: ROREY BISSON MRN: [id number] Date of Birth: 06-16-1932

## 2020-04-01 NOTE — Patient Instructions (Addendum)
Manual Lymph Drainage for face/neck  1) Place hands on either side of neck and do 15-20 stationary circles  2) Place hands on areas just above collar bones and do 15-20 stationary circles 3) Do stationary circles on both shoulders (about 10 times) 4) Do stationary circles at both armpit areas (about 10 times) 5) Take 5 deep breaths 6) Standing at the head of the bed, do stationary circles on each side of the neck just below the jaw line (15-20) 7) Standing at the head of the bed, do downward circles just underneath the chin (15-20) 8) Standing at the head of the bed, do stationary circles on each side of the face on the cheeks just above the jaw line (15-20) 9) Standing at the head of the bed, do stationary downward circles on each side of the face between the eyes and ears (15-20) 10) Repeat step 1, 2, 3, and 4  Cancer Rehab [phone]    Do not slide on the skin Only give enough pressure to stretch the skin Make sure to always wash your hands prior to massage  3 Way Raises:      Starting Position:  Leaning against wall, walk feet a few inches away from the wall and make tummy tight (tuck hips underneath you) Press back/shoulders/head against wall as much as possible. Keep thumbs up to ceiling, elbows straight and shoulders relaxed/down throughout.  1. Lift arms in front to shoulder height 2. Lift arms a little wider into a "V" to shoulder height 3. Lift arms out to sides in a "T" to shoulder height  Perform 10 times in each direction. Hold 1-2 lbs to start with and work up to 2-3 sets of 10/day. Perform 3-4 times/week. Increase weight as able, decreasing sets of 10 each time you increase weights, then slowly working your way back up to 2-3 sets each time.

## 2020-04-08 ENCOUNTER — Other Ambulatory Visit: Payer: Self-pay

## 2020-04-08 ENCOUNTER — Ambulatory Visit: Payer: Medicare Other

## 2020-04-08 DIAGNOSIS — R293 Abnormal posture: Secondary | ICD-10-CM

## 2020-04-08 DIAGNOSIS — Z9181 History of falling: Secondary | ICD-10-CM

## 2020-04-08 DIAGNOSIS — M6281 Muscle weakness (generalized): Secondary | ICD-10-CM

## 2020-04-08 DIAGNOSIS — I890 Lymphedema, not elsewhere classified: Secondary | ICD-10-CM

## 2020-04-08 NOTE — Patient Instructions (Signed)
3 Way Raises:      Starting Position:  Leaning against wall, walk feet a few inches away from the wall and make tummy tight (tuck hips underneath you) Press back and shoulders against wall as much as possible. Keep thumbs up to ceiling, elbows straight and shoulders relaxed/down throughout.  1. Lift arms in front to shoulder height 2. Lift arms a little wider into a "V" to shoulder height 3. Lift arms out to sides in a "T" to shoulder height  Perform 10 times in each direction.   Overhead Dumbbell Press    Sit on chair holding _3__ lb dumbbells. Press dumbbells above head alternating arms. Keep palms facing forward. Do _1-2__ sets of _10__ repetitions.    Cancer Rehab [phone]

## 2020-04-08 NOTE — Therapy (Addendum)
[place], [place], [address] Phone: [phone]   Fax:  [phone]  Physical Therapy Treatment  Patient Details  Name: Julian Brown MRN: [id number] Date of Birth: [birth year] Referring Provider (PT): Dr. Isidore Moos   Encounter Date: 04/08/2020   PT End of Session - 04/08/20 1220    Visit Number 8    Number of Visits 17    Date for PT Re-Evaluation 04/13/20    PT Start Time 1106    PT Stop Time 1208    PT Time Calculation (min) 62 min    Activity Tolerance Patient tolerated treatment well    Behavior During Therapy [facility] for tasks assessed/performed           Past Medical History:  Diagnosis Date  . Acute gangrenous cholecystitis s/p lap cholecystectomy 06/08/2017 06/08/2017  . Arthritis   . Colon cancer ([place])   . ED (erectile dysfunction)   . Emphysema of lung ([place])   . Hyperlipemia   . Hypertension   . Prostate cancer [facility])    prostate cancer-tx radiation  . Wears glasses     Past Surgical History:  Procedure Laterality Date  . APPENDECTOMY    . CARPAL TUNNEL RELEASE  5/12   lt  . CARPAL TUNNEL RELEASE  12/13/2011   Procedure: CARPAL TUNNEL RELEASE;  Surgeon: [name]., MD;  Location: [place];  Service: Orthopedics;  Laterality: Right;  and inject right wrist  . CHOLECYSTECTOMY N/A 06/08/2017   Procedure: LAPAROSCOPIC CHOLECYSTECTOMY WITH LYSIS OF ADHESIONS;  Surgeon: [name], MD;  [facility];  Service: General;  Laterality: N/A;  . [place]   hemicolectomy-rt-ca  . COLONOSCOPY     about 12 inches of colon removed due to colon cancer  . IR GASTROSTOMY TUBE MOD SED  11/06/2019  . IR GASTROSTOMY TUBE REMOVAL  01/22/2020    There were no vitals filed for this visit.   Subjective Assessment - 04/08/20 1111    Subjective I've been doing the self MLD about every other day and I've been doing my exercises you gave me as well. Everything  is going well. I think I'm ready to D/C today. My hip has been doing great since my shot as well.    Pertinent History OA bil knees, HTN, high cholesterol, stage 2 COPD, Pt  diagnosed with Larynx Cancer  completed 35 radiation session  on April 27 .   SUSPENSION MICRO DIRECT LARYNGOSCOPY W/ CO2 LASER on 07/12/19 at [facility] Pt had PEG removed on 01/22/2020    Patient Stated Goals to and to get stronger, anything to help him    Currently in Pain? No/denies                             [facility] Adult PT Treatment/Exercise - 04/08/20 0001      Shoulder Exercises: Seated   Other Seated Exercises Bil UE 3 way raises and alt OH presses x10 each retunring therapist demo and handout issued      Manual Therapy   Manual Lymphatic Drainage (MLD) In supine with HOB elevated : Short neck, 5 diaphragmatic breaths, SCF, bil shoulder collectors, bil axillary nodes, anterior throat, submental nodes, bil cheeks and bil temporal nodes, then retraced all steps having pt return demo of each sequence which he still struggles with technique overall, but continued to some improvement with direction of skin stretches at throat,  was still doing complete circles, not stationary.                   PT Education - 04/08/20 1121    Education Details Seated bil UE 3 way raises and OH presses    Person(s) Educated Patient;Spouse    Methods Explanation;Demonstration;Handout    Comprehension Verbalized understanding;Returned demonstration               PT Long Term Goals - 04/08/20 1122      PT LONG TERM GOAL #1   Title Pt and wife will report that know how to manage lymphedema with manual lymph drainage and compression    Baseline Pt still requires review with self MLD for correct technique, is independent with chip pack wear and has been issued info for [place] if they would like to pursue this-03/17/20; pt is independent with all now-04/08/20    Status Achieved      PT LONG TERM GOAL #2    Title Pt will be independent in a home exercise program for improved strength and balance    Baseline Pt has been inssued HEP for bil LE strength but reports has yet to be compliant with this-03/17/20; pt is now independent with this-04/08/20    Status Achieved      PT LONG TERM GOAL #3   Title Pt will decrease TUG score to < 11 to demonstrate increased functional mobility in gait    Baseline 14.09 on 02/06/2020; will retest this after hip injection on 03/18/20; today pt able to do this in 11 seconds - 04/08/20    Status Achieved                 Plan - 04/08/20 1220    Clinical Impression Statement Progressed HEP to include bil UE 3 way raises and alternating OH presses. Pt returned very good demo of these. Then spent rest of session reviewing his self manual lymph drainage technique. Still required max VCs until end of session to not slide on skin, but now he was only doing this at hte end of the skin stretch. Before he was sliding on skin for whole motion. Pt was also able to verbalize better understanding of techinque by end of session as was his wife who will be helping him with this some at home. Pt has met all goals and is ready for D/C.    Personal Factors and Comorbidities Comorbidity 3+;Comorbidity 1    Comorbidities COPD, arthritis in knees, radiation treatment to larynx    Examination-Activity Limitations Locomotion Level;Lift;Transfers;Stand    Stability/Clinical Decision Making Stable/Uncomplicated    Rehab Potential Excellent    PT Frequency 2x / week    PT Duration 4 weeks    PT Treatment/Interventions ADLs/Self Care Home Management;Gait training;Stair training;Functional mobility training;Therapeutic activities;Therapeutic exercise;Balance training;Orthotic Fit/Training;Patient/family education;Manual techniques;Manual lymph drainage    PT Next Visit Plan D/C this visit.    PT Home Exercise Plan Bil knee/quad strength and HS flexibility; daily self MLD and wear chip pack     Consulted and Agree with Plan of Care Patient;Family member/caregiver    Family Member Consulted wife           Patient will benefit from skilled therapeutic intervention in order to improve the following deficits and impairments:  Abnormal gait, Decreased balance, Decreased endurance, Decreased mobility, Difficulty walking, Postural dysfunction, Increased edema, Decreased cognition  Visit Diagnosis: Abnormal posture  Lymphedema, not elsewhere classified  At risk for falls  Muscle weakness (  generalized)     Problem List Patient Active Problem List   Diagnosis Date Noted  . Malignant neoplasm of supraglottis ([place]) 08/08/2019  . Hoarseness of voice 06/12/2019  . Cough 05/23/2019  . Primary osteoarthritis of right wrist 07/31/2018  . Stage 2 moderate COPD by GOLD classification ([place]) 05/16/2018  . Allergic rhinitis 05/16/2018  . Nocturnal hypoxemia 05/16/2018  . Abnormal finding on lung imaging 05/16/2018  . Hypertension   . Hyperlipemia   . Acute gangrenous cholecystitis s/p lap cholecystectomy 06/08/2017 06/08/2017  . Pain in right wrist 01/04/2017  . Chronic pain of left knee 01/04/2017  . Chronic pain of right knee 01/04/2017  . Unilateral primary osteoarthritis, left knee 01/04/2017  . Unilateral primary osteoarthritis, right knee 01/04/2017    Otelia Limes, PTA 04/08/2020, 12:32 PM  [place], [place], [address] Phone: [phone]   Fax:  [phone]  Name: Julian Brown MRN: [id number] Date of Birth: 1933/05/20  PHYSICAL THERAPY DISCHARGE SUMMARY  Visits from Start of Care:8  Current functional level related to goals / functional outcomes: As above   Remaining deficits: As above    Education / Equipment: Self lymphedema management, home exercise  Plan: Patient agrees to discharge.  Patient goals were met. Patient is being discharged due to being pleased  with the current functional level.  ?????         Maudry Diego, PT 04/09/20 1:25 PM

## 2020-05-11 ENCOUNTER — Telehealth: Payer: Self-pay | Admitting: *Deleted

## 2020-05-11 NOTE — Telephone Encounter (Signed)
CALLED PATIENT TO INFORM OF LAB AND FU FOR 05-12-20, LVM FOR A RETURN CALL

## 2020-05-12 ENCOUNTER — Ambulatory Visit
Admission: RE | Admit: 2020-05-12 | Discharge: 2020-05-12 | Disposition: A | Discharge status: Home / Self Care | Payer: Medicare Other | Source: Ambulatory Visit | Attending: Internal Medicine | Admitting: Internal Medicine

## 2020-05-12 ENCOUNTER — Ambulatory Visit
Admission: RE | Admit: 2020-05-12 | Discharge: 2020-05-12 | Disposition: A | Discharge status: Home / Self Care | Payer: Medicare Other | Source: Ambulatory Visit | Attending: Radiation Oncology | Admitting: Radiation Oncology

## 2020-05-12 ENCOUNTER — Other Ambulatory Visit: Payer: Self-pay

## 2020-05-12 VITALS — BP 149/61 | HR 67 | Temp 98.1°F | Resp 20 | Ht 72.0 in | Wt 186.8 lb

## 2020-05-12 DIAGNOSIS — Z8521 Personal history of malignant neoplasm of larynx: Secondary | ICD-10-CM | POA: Insufficient documentation

## 2020-05-12 DIAGNOSIS — Z1329 Encounter for screening for other suspected endocrine disorder: Secondary | ICD-10-CM | POA: Insufficient documentation

## 2020-05-12 DIAGNOSIS — Z08 Encounter for follow-up examination after completed treatment for malignant neoplasm: Secondary | ICD-10-CM | POA: Insufficient documentation

## 2020-05-12 DIAGNOSIS — C321 Malignant neoplasm of supraglottis: Secondary | ICD-10-CM

## 2020-05-12 LAB — TSH: TSH: 0.461 u[IU]/mL (ref 0.320–4.118)

## 2020-05-12 MED ORDER — LARYNGOSCOPY SOLUTION RAD-ONC
15.0000 mL | Freq: Once | TOPICAL | Status: AC
  Administered 2020-05-12: 15 mL via TOPICAL
  Filled 2020-05-12: qty 15

## 2020-05-12 NOTE — Progress Notes (Addendum)
Mr. Gertner presents todayfor follow up after completing radiation to the larynx on 10/08/2019  Pain issues, if any: Patient denies any throat pain. Is recovering from cellulitis in his right hand, and reports the swelling (and arthritis) in his wrist is uncomfortable Using a feeding tube?: N/A--removed on 01/22/2020 Weight changes, if any:  Wt Readings from Last 3 Encounters:  05/12/20 186 lb 12.8 oz (84.7 kg)  03/10/20 189 lb 3.2 oz (85.8 kg)  01/14/20 193 lb 6.4 oz (87.7 kg)   Swallowing issues, if any: Patient denies. Reports he is able to eat and drink a wide variety of food and drink Smoking or chewing tobacco? None Using fluoride trays daily? Continues to use fluoride toothpaste Last ENT visit was on: 02/18/2020 Saw Dr. Francina Ames "Impression  -Left posterior laryngeal squamous cell carcinoma , with true vocal cord / arytenoid paralysis  -He underwent RT, completed 10/08/19 -There is no evidence of disease on exam or endoscopy today. PET with activity on the contralateral right arytenoid, likely physiologic. -Return in 3 months, certainly sooner if there are any questions or problems. We'll see how his edema and function are, and consider a [facility] consult at that time if things have settled down for him. -He sees Dr Isidore Moos in November.  -He is agreeable with this plan. All his questions were answered."  Other notable issues, if any: Denies any issues with swelling under his jaw/throat. Reports he felt a small nodule on the left side of his neck, but states that it resolved on its own. Denies any issues with ear or jaw pain, or difficulty opening his mouth. Continues to deal with vocal hoarseness. Had F/U with urologist (Dr. Tresa Endo) on 05/11/2020 for survalliance of prostate cancer: "Overall, Mr. Daoust is doing well. He has done very well with the radiation to his larynx. He will obtain a PSA and call for results. If this is okay, he will see [place] on an yearly basis or as  needed.Return in about 1 year (around 05/11/2021). PSA was 0.21"  Vitals:   05/12/20 1434  BP: (!) 149/61  Pulse: 67  Resp: 20  Temp: 98.1 F (36.7 C)  SpO2: 96%

## 2020-05-13 ENCOUNTER — Telehealth: Payer: Self-pay

## 2020-05-13 NOTE — Telephone Encounter (Signed)
-----   Message from Eppie Gibson, MD sent at 05/12/2020  3:43 PM EST ----- Please let them know TSH is WNL  Thanks! ----- Message ----- From: Interface, Lab In [place] Sent: 05/12/2020   3:36 PM EST To: Eppie Gibson, MD

## 2020-05-13 NOTE — Telephone Encounter (Signed)
Called patient's home number and left a VM relaying that TSH was normal. Encouraged patient to call back should he have any other questions/needs. Also called his and his wife's cell phones, but did not get an answer and was not able to leave a VM with either number

## 2020-05-15 ENCOUNTER — Telehealth: Payer: Self-pay

## 2020-05-15 ENCOUNTER — Encounter: Payer: Self-pay | Admitting: Radiation Oncology

## 2020-05-15 ENCOUNTER — Telehealth (HOSPITAL_COMMUNITY): Payer: Self-pay

## 2020-05-15 NOTE — Telephone Encounter (Signed)
LVM for pt to call back to schedule.

## 2020-05-15 NOTE — Progress Notes (Signed)
Radiation Oncology         [phone] ________________________________  Name: Julian Brown MRN: [id number]  Date: 05/12/2020  DOB: 1932-09-18  Follow-Up Visit Note  CC: Avva, Steva Ready, MD  Prince Solian, MD  Diagnosis and Prior Radiotherapy:       ICD-10-CM   1. Malignant neoplasm of supraglottis ([facility])  C32.1 Fiberoptic laryngoscopy    laryngocopy solution for Rad-Onc     Cancer Staging:  Cancer Staging Malignant neoplasm of supraglottis ([place]) Staging form: Larynx - Supraglottis, AJCC 8th Edition - Clinical stage from 08/06/2019: Stage III (cT3, cN0, cM0) - Signed by Eppie Gibson, MD on 08/08/2019  Radiation Treatment Dates: 08/21/2019 through 10/08/2019  Site Technique Total Dose (Gy) Dose per Fx (Gy) Completed Fx Beam Energies  Larynx: HN_Larynx IMRT 70/70 2 35/35 6X     CHIEF COMPLAINT: Throat cancer  Narrative: Julian Brown presents todayfor follow up after completing radiation to the larynx on 10/08/2019.  Overall he and his wife are very pleased with how he is doing.  Pain issues, if any: Patient denies any throat pain. Is recovering from cellulitis in his right hand, and reports the swelling (and arthritis) in his wrist is uncomfortable Using a feeding tube?: N/A--removed on 01/22/2020 Weight changes, if any:  Wt Readings from Last 3 Encounters:  05/12/20 186 lb 12.8 oz (84.7 kg)  03/10/20 189 lb 3.2 oz (85.8 kg)  01/14/20 193 lb 6.4 oz (87.7 kg)   Swallowing issues, if any: Patient denies. Reports he is able to eat and drink a wide variety of food and drink Smoking or chewing tobacco? None Using fluoride trays daily? Continues to use fluoride toothpaste Last ENT visit was on: 02/18/2020 Saw Dr. Francina Ames "Impression  -Left posterior laryngeal squamous cell carcinoma , with true vocal cord / arytenoid paralysis  -He underwent RT, completed 10/08/19 -There is no evidence of disease on exam or endoscopy today. PET with activity on the contralateral  right arytenoid, likely physiologic. -Return in 3 months, certainly sooner if there are any questions or problems. We'll see how his edema and function are, and consider a [facility] consult at that time if things have settled down for him. -He sees Dr Isidore Moos in November.  -He is agreeable with this plan. All his questions were answered."  Other notable issues, if any: Denies any issues with swelling under his jaw/throat. Reports he felt a small nodule on the left side of his neck, but states that it resolved on its own. Denies any issues with ear or jaw pain, or difficulty opening his mouth. Continues to deal with vocal hoarseness. Had F/U with urologist (Dr. Tresa Endo) on 05/11/2020 for survalliance of prostate cancer: "Overall, Mr. Arata is doing well. He has done very well with the radiation to his larynx. He will obtain a PSA and call for results. If this is okay, he will see [place] on an yearly basis or as needed.Return in about 1 year (around 05/11/2021). PSA was 0.21"  Vitals:   05/12/20 1434  BP: (!) 149/61  Pulse: 67  Resp: 20  Temp: 98.1 F (36.7 C)  SpO2: 96%      ALLERGIES:  is allergic to penicillins.  Meds: Current Outpatient Medications  Medication Sig Dispense Refill  . acetaminophen (TYLENOL) 500 MG tablet Take 1,000 mg by mouth every 6 (six) hours as needed for moderate pain.    Marland Kitchen amLODipine (NORVASC) 10 MG tablet Take 10 mg by mouth daily.    Marland Kitchen aspirin EC  81 MG tablet Take 81 mg by mouth daily.    Marland Kitchen BREO ELLIPTA 100-25 MCG/INH AEPB INHALE 1 PUFF INTO THE LUNGS ONCE DAILY. (Patient taking differently: Inhale 1 puff into the lungs daily. ) 60 each 5  . Cholecalciferol (VITAMIN D) 50 MCG (2000 UT) tablet Take 2,000 Units by mouth daily.    Marland Kitchen docusate sodium (COLACE) 100 MG capsule Take 2 capsules (200 mg total) by mouth daily. Take while on Hycet to prevent constipation. 60 capsule 1  . fluticasone (FLONASE) 50 MCG/ACT nasal spray Place 1 spray into both nostrils daily  as needed for allergies.     Marland Kitchen gabapentin (NEURONTIN) 300 MG capsule Take 1 capsule (300 mg total) by mouth 3 (three) times daily. 90 capsule 1  . HYDROcodone-acetaminophen (HYCET) 7.5-325 mg/15 ml solution Take 15 mLs by mouth 3 (three) times daily as needed for moderate pain. Take with food. 473 mL 0  . lidocaine (XYLOCAINE) 2 % solution Patient: Mix 1part 2% viscous lidocaine, 1part H20. Swish & swallow 88mL of diluted mixture, 41min before meals and at bedtime, up to QID (Patient taking differently: Use as directed 15 mLs in the mouth or throat See admin instructions. Patient: Mix 1part 2% viscous lidocaine, 1part H20. Swish & swallow 37mL of diluted mixture, 65min before meals and at bedtime as needed mouth/throat pain) 200 mL 3  . Multiple Vitamins-Minerals (MULTIVITAMIN WITH MINERALS) tablet Take 1 tablet by mouth daily. 10 tablet 1  . Nutritional Supplements (FEEDING SUPPLEMENT, OSMOLITE 1.5 CAL,) LIQD Give 1.5 cartons of formula at 8am, noon, 4pm and 8pm.  Flush with 41ml of water before and after feeding. Will need to drink additional 252ml water or give via tube to meet hydration needs.  Meets 100% of estimated needs.  Send bolus supplies. 1422 mL 0  . Nutritional Supplements (PROSOURCE TF) LIQD Give 74ml BID via feeding tube.  Mix with 30-57ml of water with prosource.  Flush tube with 30-60 ml of water first, then give prosource and water mixture and flush with 30-18ml after. 60 mL 10  . olmesartan-hydrochlorothiazide (BENICAR HCT) 40-12.5 MG tablet Take 1 tablet by mouth daily.     Vladimir Faster Glycol-Propyl Glycol (SYSTANE OP) Place 1 drop into both eyes daily as needed (dry eyes).    . potassium & sodium phosphates (SODIUM, POTASSIUM, PHOSPHORUS) 280-160-250 MG PACK Dissolve in approx. 1/3 cup of water four times a day, take after meals and at bedtime. 120 each 0  . rosuvastatin (CRESTOR) 10 MG tablet Take 10 mg by mouth daily.    . sodium fluoride (PREVIDENT 5000 PLUS) 1.1 % CREA dental  cream Apply to tooth brush. Brush teeth for 2 minutes. Spit out excess-DO NOT swallow. Repeat nightly. (Patient taking differently: Place 1 application onto teeth at bedtime. Apply to tooth brush. Brush teeth for 2 minutes. Spit out excess-DO NOT swallow. Repeat nightly.) 1 Tube prn  . SPIRIVA RESPIMAT 1.25 MCG/ACT AERS USE 2 PUFFS EACH DAY (Patient taking differently: Inhale 2 puffs into the lungs daily. ) 4 g 5  . thiamine 100 MG tablet Take 1 tablet (100 mg total) by mouth daily. 10 tablet 1  . Wound Dressings (SONAFINE EX) Apply 1 application topically daily.     No current facility-administered medications for this encounter.    Physical Findings:   Wt Readings from Last 3 Encounters:  05/12/20 186 lb 12.8 oz (84.7 kg)  03/10/20 189 lb 3.2 oz (85.8 kg)  01/14/20 193 lb 6.4 oz (87.7 kg)  height is 6' (1.829 m) and weight is 186 lb 12.8 oz (84.7 kg). His temperature is 98.1 F (36.7 C). His blood pressure is 149/61 (abnormal) and his pulse is 67. His respiration is 20 and oxygen saturation is 96%. .  General:   He is nontoxic-appearing.  He is ambulatory.  He is in no acute distress  HEENT: No oral or upper throat lesions. Skin intact and well healed over neck. He still has some hoarseness.  Neck: No palpable adenopathy  Heart: Regular in rate and rhythm  Chest clear to auscultation bilaterally  PROCEDURE NOTE: After obtaining consent and anesthetizing the nasal cavity with topical lidocaine and phenylephrine, the flexible endoscope was introduced and passed through the nasal cavity.  The nasopharynx, oropharynx, larynx, and hypopharynx were then examined.  Exam notable for left supraglottic (arytenoid) paralysis, consistent with previous notes by ENT.  No nodules, lesions, or ulcers visualized throughout the examination.  Lab Findings: Lab Results  Component Value Date   WBC 6.3 11/06/2019   HGB 14.6 11/06/2019   HCT 45.0 11/06/2019   MCV 101.4 (H) 11/06/2019   PLT 310  11/06/2019    Lab Results  Component Value Date   TSH 0.461 05/12/2020    Radiographic Findings: No results found.  Impression/Plan: Previous history of throat cancer. Cancer Staging Malignant neoplasm of supraglottis [facility]) Staging form: Larynx - Supraglottis, AJCC 8th Edition - Clinical stage from 08/06/2019: Stage III (cT3, cN0, cM0) - Signed by Eppie Gibson, MD on 08/08/2019  Head and neck oncology status: No evidence of disease  Nutrition: He is doing well.  His PEG is been removed  Swallowing: Denies any dysphagia, much improved.  He was discharged by SLP but I will refer him back as the patient is concerned by his hoarseness and would like to undergo speech therapy.  I suspect the left arytenoid paralysis is a late effect of his tumor and previous treatment.  Thyroid function: Check annually, normal function Lab Results  Component Value Date   TSH 0.461 05/12/2020    Follow-up plans:  I will see him back in May 2022.  On date of service, in total, I spent 30 minutes on this encounter. Patient seen in person. _____________________________________   Eppie Gibson, MD

## 2020-05-15 NOTE — Telephone Encounter (Signed)
Approved for Monovisc-Bilateral knee Dr. Margarito Liner and [facility] No copay 20% OOP No prior auth required   [facility] to schedule @ next available

## 2020-05-15 NOTE — Telephone Encounter (Signed)
Called and spoke with pt wife [name] in regards to PR, she stated pt is not interested at this time.  Closed referral

## 2020-05-27 ENCOUNTER — Ambulatory Visit (INDEPENDENT_AMBULATORY_CARE_PROVIDER_SITE_OTHER): Payer: Medicare Other | Admitting: Orthopaedic Surgery

## 2020-05-27 ENCOUNTER — Encounter: Payer: Self-pay | Admitting: Orthopaedic Surgery

## 2020-05-27 DIAGNOSIS — M1711 Unilateral primary osteoarthritis, right knee: Secondary | ICD-10-CM | POA: Diagnosis not present

## 2020-05-27 DIAGNOSIS — M1712 Unilateral primary osteoarthritis, left knee: Secondary | ICD-10-CM

## 2020-05-27 MED ORDER — HYALURONAN 88 MG/4ML IX SOSY
88.0000 mg | PREFILLED_SYRINGE | INTRA_ARTICULAR | Status: AC | PRN
  Administered 2020-05-27: 88 mg via INTRA_ARTICULAR

## 2020-05-27 NOTE — Progress Notes (Signed)
   Procedure Note  Patient: Julian Brown             Date of Birth: December 31, 1932           MRN: [id number]             Visit Date: 05/27/2020  HPI: Julian Brown comes in today for injections with Monovisc both knees.  He has had no new injury to either knee.  He is overall doing well from the cortisone injections which were given in September.  He had some problems with his wrist and saw a hand and wrist orthopedic specialist told he had arthritis of his wrist.  Physical exam: Bilateral knees he has significant crepitus with passive range of motion of the right knee.  No abnormal warmth erythema or effusion in either knee good range of motion of both knees.  Procedures: Visit Diagnoses:  1. Primary osteoarthritis of right knee   2. Unilateral primary osteoarthritis, left knee     Large Joint Inj on 05/27/2020 10:05 AM Indications: pain Details: 22 G 1.5 in needle, anterolateral approach  Arthrogram: No  Medications: 88 mg Hyaluronan 88 MG/4ML Outcome: tolerated well, no immediate complications Procedure, treatment alternatives, risks and benefits explained, specific risks discussed. Consent was given by the patient. Immediately prior to procedure a time out was called to verify the correct patient, procedure, equipment, support staff and site/side marked as required. Patient was prepped and draped in the usual sterile fashion.      Plan: He needs to wait at least 6 months between supplemental injections and 3 months between cortisone injections.  He will follow up with [place] as needed.  Questions encouraged and answered

## 2020-06-01 ENCOUNTER — Ambulatory Visit
Admission: RE | Admit: 2020-06-01 | Discharge: 2020-06-01 | Disposition: A | Discharge status: Home / Self Care | Payer: Medicare Other | Source: Ambulatory Visit | Attending: Internal Medicine | Admitting: Internal Medicine

## 2020-06-01 DIAGNOSIS — J449 Chronic obstructive pulmonary disease, unspecified: Secondary | ICD-10-CM

## 2020-06-06 NOTE — Progress Notes (Signed)
This CT is abnormal but stable x 1 year. Given throat cancer hx results should be calledin . Please let him know - no change x 1 year. However, I did ask for followup in dec 2021 to discuss CT results and also symptom response to MDI that was restarted. Please give him foloowup with an app to review. IF not, retrun in 1 year with repeat CT chest wo contrast  Thanks  MR xxxxxx   IMPRESSION: 1. Stable appearance of volume loss in the RIGHT middle lobe. No new or suspicious pulmonary nodule. 2. Pulmonary emphysema. Stable appearance of bilateral thyroid nodules, previously evaluated with ultrasound and biopsied. 3. Stable areas of nodularity in the LEFT upper lobe. 4. Emphysema and aortic atherosclerosis.  Aortic Atherosclerosis (ICD10-I70.0) and Emphysema (ICD10-J43.9).   Electronically Signed   By: Zetta Bills M.D.   On: 06/01/2020 14:16

## 2020-06-09 ENCOUNTER — Telehealth: Payer: Self-pay | Admitting: Internal Medicine

## 2020-06-09 DIAGNOSIS — R918 Other nonspecific abnormal finding of lung field: Secondary | ICD-10-CM

## 2020-06-09 NOTE — Telephone Encounter (Signed)
Julian Shan, MD  06/06/2020 2:53 PM EST      This CT is abnormal but stable x 1 year. Given throat cancer hx results should be calledin . Please let him know - no change x 1 year. However, I did ask for followup in dec 2021 to discuss CT results and also symptom response to MDI that was restarted. Please give him foloowup with an app to review. IF not, retrun in 1 year with repeat CT chest wo contrast  Thanks  MR xxxxxx   IMPRESSION: 1. Stable appearance of volume loss in the RIGHT middle lobe. No new or suspicious pulmonary nodule. 2. Pulmonary emphysema. Stable appearance of bilateral thyroid nodules, previously evaluated with ultrasound and biopsied. 3. Stable areas of nodularity in the LEFT upper lobe. 4. Emphysema and aortic atherosclerosis.  Aortic Atherosclerosis (ICD10-I70.0) and Emphysema (ICD10-J43.9).   Called and spoke with pt's wife Julian Brown letting her know the results of pt's CT and she verbalized understanding. She stated pt is doing okay and at this time they do not want to schedule OV to further discuss results. She stated that they will wait to have CT repeated in 1 year. Order has been placed. Nothing further needed.

## 2020-09-16 ENCOUNTER — Encounter: Payer: Self-pay | Admitting: Orthopaedic Surgery

## 2020-09-16 ENCOUNTER — Ambulatory Visit (INDEPENDENT_AMBULATORY_CARE_PROVIDER_SITE_OTHER): Payer: Medicare Other | Admitting: Orthopaedic Surgery

## 2020-09-16 VITALS — Ht 70.0 in | Wt 191.4 lb

## 2020-09-16 DIAGNOSIS — G8929 Other chronic pain: Secondary | ICD-10-CM

## 2020-09-16 DIAGNOSIS — M25562 Pain in left knee: Secondary | ICD-10-CM

## 2020-09-16 DIAGNOSIS — M1712 Unilateral primary osteoarthritis, left knee: Secondary | ICD-10-CM | POA: Diagnosis not present

## 2020-09-16 DIAGNOSIS — M25462 Effusion, left knee: Secondary | ICD-10-CM | POA: Diagnosis not present

## 2020-09-16 MED ORDER — LIDOCAINE HCL 1 % IJ SOLN
3.0000 mL | INTRAMUSCULAR | Status: AC | PRN
  Administered 2020-09-16: 3 mL

## 2020-09-16 MED ORDER — METHYLPREDNISOLONE ACETATE 40 MG/ML IJ SUSP
40.0000 mg | INTRAMUSCULAR | Status: AC | PRN
  Administered 2020-09-16: 40 mg via INTRA_ARTICULAR

## 2020-09-16 NOTE — Progress Notes (Signed)
Office Visit Note   Patient: Julian Brown           Date of Birth: Jan 18, 1933           MRN: [id number] Visit Date: 09/16/2020              Requested by: Prince Solian, MD [address] [place],  [place] [address] PCP: Prince Solian, MD   Assessment & Plan: Visit Diagnoses:  1. Unilateral primary osteoarthritis, left knee   2. Chronic pain of left knee   3. Effusion, left knee     Plan: I was able to aspirate between 70 and 80 cc of fluid from his left knee that is consistent with his arthritis.  I did place a steroid injection in the knee.  At this point I would not recommend any more hyaluronic acid injections.  He does know that we would not be able to place a steroid in the knee again for at least a minimum of 3 months.  We can always aspirate it again between now and then if needed.  All questions and concerns were answered and addressed.  Follow-Up Instructions: Return if symptoms worsen or fail to improve.   Orders:  Orders Placed This Encounter  Procedures  . Large Joint Inj   No orders of the defined types were placed in this encounter.     Procedures: Large Joint Inj: L knee on 09/16/2020 9:29 AM Indications: diagnostic evaluation and pain Details: 22 G 1.5 in needle, superolateral approach  Arthrogram: No  Medications: 3 mL lidocaine 1 %; 40 mg methylPREDNISolone acetate 40 MG/ML Outcome: tolerated well, no immediate complications Procedure, treatment alternatives, risks and benefits explained, specific risks discussed. Consent was given by the patient. Immediately prior to procedure a time out was called to verify the correct patient, procedure, equipment, support staff and site/side marked as required. Patient was prepped and draped in the usual sterile fashion.       Clinical Data: No additional findings.   Subjective: Chief Complaint  Patient presents with  . Left Knee - Pain  The patient is an 87 year old gentleman with well-documented  left knee osteoarthritis.  We last placed a hyaluronic acid in his knee in December.  He comes in today with acute knee pain and swelling.  It started about several weeks ago and is getting worse.  He is not taking anything for pain but an occasional Advil.  His wife is with him.  We talked about a knee replacement in the past but she said he would not really tolerate this with his comorbidities.  He is not a diabetic.  HPI  Review of Systems Today he denies any headache, chest pain, shortness of breath, fever, chills, nausea, vomiting  Objective: Vital Signs: Ht 5\' 10"  (1.778 m)   Wt 191 lb 6.4 oz (86.8 kg)   BMI 27.46 kg/m   Physical Exam He is alert and orient x3 and in no acute distress.  He is slow to mobilize and has obvious discomfort with his left knee. Ortho Exam Examination of left knee does show a very large effusion.  There is warmth but no redness.  There is painful arc of motion of the knee. Specialty Comments:  No specialty comments available.  Imaging: No results found.   PMFS History: Patient Active Problem List   Diagnosis Date Noted  . Malignant neoplasm of supraglottis ([place]) 08/08/2019  . Hoarseness of voice 06/12/2019  . Cough 05/23/2019  . Primary osteoarthritis of right  wrist 07/31/2018  . Stage 2 moderate COPD by GOLD classification ([facility]) 05/16/2018  . Allergic rhinitis 05/16/2018  . Nocturnal hypoxemia 05/16/2018  . Abnormal finding on lung imaging 05/16/2018  . Hypertension   . Hyperlipemia   . Acute gangrenous cholecystitis s/p lap cholecystectomy 06/08/2017 06/08/2017  . Pain in right wrist 01/04/2017  . Chronic pain of left knee 01/04/2017  . Chronic pain of right knee 01/04/2017  . Unilateral primary osteoarthritis, left knee 01/04/2017  . Unilateral primary osteoarthritis, right knee 01/04/2017   Past Medical History:  Diagnosis Date  . Acute gangrenous cholecystitis s/p lap cholecystectomy 06/08/2017 06/08/2017  . Arthritis   . Colon  cancer ([place])   . ED (erectile dysfunction)   . Emphysema of lung ([place])   . Hyperlipemia   . Hypertension   . Prostate cancer [facility])    prostate cancer-tx radiation  . Wears glasses     History reviewed. No pertinent family history.  Past Surgical History:  Procedure Laterality Date  . APPENDECTOMY    . CARPAL TUNNEL RELEASE  5/12   lt  . CARPAL TUNNEL RELEASE  12/13/2011   Procedure: CARPAL TUNNEL RELEASE;  Surgeon: [name]., MD;  Location: [facility];  Service: Orthopedics;  Laterality: Right;  and inject right wrist  . CHOLECYSTECTOMY N/A 06/08/2017   Procedure: LAPAROSCOPIC CHOLECYSTECTOMY WITH LYSIS OF ADHESIONS;  Surgeon: [name], MD;  Location: [facility];  Service: General;  Laterality: N/A;  . [place]   hemicolectomy-rt-ca  . COLONOSCOPY     about 12 inches of colon removed due to colon cancer  . IR GASTROSTOMY TUBE MOD SED  11/06/2019  . IR GASTROSTOMY TUBE REMOVAL  01/22/2020   Social History   Occupational History  . Not on file  Tobacco Use  . Smoking status: Former Smoker    Packs/day: 1.00    Years: 36.00    Pack years: 36.00    Types: Cigarettes    Quit date: 12/09/1986    Years since quitting: 33.7  . Smokeless tobacco: Never Used  Vaping Use  . Vaping Use: Never used  Substance and Sexual Activity  . Alcohol use: Yes    Comment: occ  . Drug use: No  . Sexual activity: Not on file

## 2020-11-05 ENCOUNTER — Other Ambulatory Visit: Payer: Self-pay

## 2020-11-05 ENCOUNTER — Telehealth: Payer: Self-pay | Admitting: *Deleted

## 2020-11-05 DIAGNOSIS — Z1329 Encounter for screening for other suspected endocrine disorder: Secondary | ICD-10-CM

## 2020-11-05 NOTE — Telephone Encounter (Signed)
CALLED PATIENT TO ASK ABOUT COMING IN FOR LAB  ON 11-06-20 @ 2 PM, PRIOR TO 2:20 PM FU, PATIENT AGREED TO DO SO

## 2020-11-06 ENCOUNTER — Encounter: Payer: Self-pay | Admitting: Radiation Oncology

## 2020-11-06 ENCOUNTER — Ambulatory Visit
Admission: RE | Admit: 2020-11-06 | Discharge: 2020-11-06 | Disposition: A | Discharge status: Home / Self Care | Payer: Medicare Other | Source: Ambulatory Visit | Attending: Radiation Oncology | Admitting: Radiation Oncology

## 2020-11-06 ENCOUNTER — Other Ambulatory Visit: Payer: Self-pay

## 2020-11-06 VITALS — BP 152/71 | HR 75 | Temp 97.0°F | Resp 18 | Ht 70.0 in | Wt 187.4 lb

## 2020-11-06 DIAGNOSIS — Z1329 Encounter for screening for other suspected endocrine disorder: Secondary | ICD-10-CM

## 2020-11-06 DIAGNOSIS — R5383 Other fatigue: Secondary | ICD-10-CM

## 2020-11-06 DIAGNOSIS — Z79899 Other long term (current) drug therapy: Secondary | ICD-10-CM | POA: Insufficient documentation

## 2020-11-06 DIAGNOSIS — C321 Malignant neoplasm of supraglottis: Secondary | ICD-10-CM

## 2020-11-06 DIAGNOSIS — I10 Essential (primary) hypertension: Secondary | ICD-10-CM

## 2020-11-06 LAB — TSH: TSH: 1.905 u[IU]/mL (ref 0.320–4.118)

## 2020-11-06 MED ORDER — OXYMETAZOLINE HCL 0.05 % NA SOLN
2.0000 | Freq: Once | NASAL | Status: AC
Start: 2020-11-06 — End: 2020-11-06
  Administered 2020-11-06: 2 via NASAL
  Filled 2020-11-06: qty 30

## 2020-11-06 NOTE — Progress Notes (Signed)
Julian Brown presents todayfor follow up after completing radiation to the larynx on 10/08/2019  Pain issues, if any: Patient denies Using a feeding tube?: N/A Weight changes, if any:  Wt Readings from Last 3 Encounters:  11/06/20 187 lb 6 oz (85 kg)  09/16/20 191 lb 6.4 oz (86.8 kg)  05/12/20 186 lb 12.8 oz (84.7 kg)   Swallowing issues, if any: Patient denies--but has not tried eating steak/salads (more difficult foods) Had MBS on 08/20/2020:  FINDINGS:  --1st BARIUM CONSISTENCY: Thin  PHARYNGEAL PHASE: Trace penetration without aspiration   POST SWALLOW: Small amount of residual which was cleared on subsequent swallow.  --2nd BARIUM CONSISTENCY: Puree  PHARYNGEAL PHASE: No laryngeal penetration or aspiration.   POST SWALLOW: Small amount of residual which was cleared on subsequent swallow.  --3rd BARIUM CONSISTENCY: Solid  PHARYNGEAL PHASE: No laryngeal penetration or aspiration.   POST SWALLOW: Small amount of residual which was cleared on subsequent swallow.  --4th BARIUM CONSISTENCY: Pill  13 mm barium pill was swallowed without difficulty.  --CONCLUSION:  Abnormal modified barium swallow as described. Please refer to the speech pathologist's report for further detail and dietary recommendations.  Smoking or chewing tobacco? None Using fluoride trays daily? N/A--uses fluoride toothpaste. Sees a dentist regularly throughout the year Last ENT visit was on: 08/20/2020 Saw Dr. Mila Homer (ENT with [facility]):  -"-Prognosis: fair with medical stability; potentially guarded with latent effects of XRT to swallowing function --Re-Evaluate : 44-months in coordination with ENT visit Contact the SLP office at [phone] to set up outpatient re-evaluation as indicated and/or if difficulty observed or if patient status changes."  06/02/2020 Saw Dr. Francina Ames: "Procedure: Flexible fiberoptic laryngoscopy --Technique: After anesthetizing the nasal cavity with topical  lidocaine and oxymetazoline, the flexible endoscope was introduced and passed through the nasal cavity into the nasopharynx. The scope was then advanced to the level of the oropharynx. The posterior soft palate, uvula, tongue base and vallecula were visualized and appeared healthy without mucosal masses or lesions. There are treatment changes of the left larynx, some edema, and the left arytenoid is a bit twisted . The right TVC was visualized and appeared healthy without mucosal masses or lesions, the left TVC was difficult to see. Arytenoid mobility was seen on both sides. Overall unchanged since last visit. The scope was withdrawn from the nose. He tolerated the procedure well.  Impression  --Left posterior laryngeal squamous cell carcinoma , with true vocal cord / arytenoid paralysis  --He underwent RT, completed 10/08/19 --There is no evidence of disease on exam or endoscopy today.  --He sees Dr Isidore Moos in May  --He would like a [place] consult and I'll set that up for him.    Other notable issues, if any: Continues to deal with vocal hoarseness and phlegm build. Currently wearing 2L-O2 at night which he reports dries his nose out (recommended he reach out to [facility] agency to see if they can bring a humidifier to attach to oxygen concentrator). Denies any ear or jaw pain, or difficulty opening his mouth fully. Reports occasional dry mouth but states it's tolerable. Wife reports Dr. Rowe Clack recommended a sleep study, but this hasn't been scheduled yet and patient is not sure he's interested in having done  Today's Vitals   11/06/20 1421  BP: (!) 152/71  Pulse: 75  Resp: 18  Temp: (!) 97 F (36.1 C)  TempSrc: Temporal  SpO2: 95%  Weight: 187 lb 6 oz (85 kg)  Height: 5\' 10"  (1.778  m)

## 2020-11-06 NOTE — Progress Notes (Signed)
Radiation Oncology         [phone] ________________________________  Name: Julian Brown MRN: [id number]  Date: 11/06/2020  DOB: 02-01-1933  Follow-Up Visit Note  CC: Julian Solian, MD  Julian Solian, MD  Diagnosis and Prior Radiotherapy:       ICD-10-CM   1. Malignant neoplasm of supraglottis ([place])  C32.1 Fiberoptic laryngoscopy    oxymetazoline (AFRIN) 0.05 % nasal spray 2 spray    TSH  2. Screening for hypothyroidism  Z13.29 TSH  3. Primary hypertension  I10 TSH  4. Fatigue, unspecified type  R53.83      Cancer Staging:  Cancer Staging Malignant neoplasm of supraglottis ([place]) Staging form: Larynx - Supraglottis, AJCC 8th Edition - Clinical stage from 08/06/2019: Stage III (cT3, cN0, cM0) - Signed by Julian Gibson, MD on 08/08/2019  Radiation Treatment Dates: 08/21/2019 through 10/08/2019  Site Technique Total Dose (Gy) Dose per Fx (Gy) Completed Fx Beam Energies  Larynx: HN_Larynx IMRT 70/70 2 35/35 6X     CHIEF COMPLAINT: Throat cancer  Narrative:  [name] presents todayfor follow up after completing radiation to the larynx on 10/08/2019  Pain issues, if any: Patient denies Using a feeding tube?: N/A Weight changes, if any:  Wt Readings from Last 3 Encounters:  11/06/20 187 lb 6 oz (85 kg)  09/16/20 191 lb 6.4 oz (86.8 kg)  05/12/20 186 lb 12.8 oz (84.7 kg)   Swallowing issues, if any: Patient denies--but has not tried eating steak/salads (more difficult foods)  Smoking or chewing tobacco? None Using fluoride trays daily? N/A--uses fluoride toothpaste. Sees a dentist regularly throughout the year Last ENT visit was on: 08/20/2020 Saw Julian. Mila Brown (ENT with Julian Brown):    06/02/2020 Saw Julian. Francina Brown: "Procedure: Flexible fiberoptic laryngoscopy --Technique: After anesthetizing the nasal cavity with topical lidocaine and oxymetazoline, the flexible endoscope was introduced and passed through the nasal cavity into the nasopharynx.  The scope was then advanced to the level of the oropharynx. The posterior soft palate, uvula, tongue base and vallecula were visualized and appeared healthy without mucosal masses or lesions. There are treatment changes of the left larynx, some edema, and the left arytenoid is a bit twisted . The right TVC was visualized and appeared healthy without mucosal masses or lesions, the left TVC was difficult to see. Arytenoid mobility was seen on both sides. Overall unchanged since last visit. The scope was withdrawn from the nose. He tolerated the procedure well.  Impression  --Left posterior laryngeal squamous cell carcinoma , with true vocal cord / arytenoid paralysis  --He underwent RT, completed 10/08/19 --There is no evidence of disease on exam or endoscopy today.  --He sees Julian Brown in May  --He would like a [place] consult and I'll set that up for him.    Other notable issues, if any: Continues to deal with vocal hoarseness and phlegm build. Currently wearing 2L-O2 at night which he reports dries his nose out (recommended he reach out to [facility] agency to see if they can bring a humidifier to attach to oxygen concentrator). Denies any ear or jaw pain, or difficulty opening his mouth fully. Reports occasional dry mouth but states it's tolerable. Wife reports Julian. Rowe Brown recommended a sleep study, but this hasn't been scheduled yet and patient is not sure he's interested in having done   ALLERGIES:  is allergic to fenofibrate, other, amoxicillin-pot clavulanate, and penicillins.  Meds: Current Outpatient Medications  Medication Sig Dispense Refill  . Multiple Vitamins-Minerals (PRESERVISION  AREDS 2) CAPS Take 1 tablet by mouth daily.    Marland Kitchen acetaminophen (TYLENOL) 500 MG tablet Take 1,000 mg by mouth every 6 (six) hours as needed for moderate pain.    Marland Kitchen aspirin EC 81 MG tablet Take 81 mg by mouth daily.    Marland Kitchen BREO ELLIPTA 100-25 MCG/INH AEPB INHALE 1 PUFF INTO THE LUNGS ONCE DAILY. (Patient taking  differently: Inhale 1 puff into the lungs daily. ) 60 each 5  . Cholecalciferol (VITAMIN D) 50 MCG (2000 UT) tablet Take 2,000 Units by mouth daily.    . fluticasone (FLONASE) 50 MCG/ACT nasal spray Place 1 spray into both nostrils daily as needed for allergies.     Julian Brown (SYSTANE OP) Place 1 drop into both eyes daily as needed (dry eyes).    . rosuvastatin (Julian Brown) 10 MG tablet Take 10 mg by mouth daily.    . sodium fluoride (PREVIDENT 5000 PLUS) 1.1 % CREA dental cream Apply to tooth brush. Brush teeth for 2 minutes. Spit out excess-DO NOT swallow. Repeat nightly. (Patient taking differently: Place 1 application onto teeth at bedtime. Apply to tooth brush. Brush teeth for 2 minutes. Spit out excess-DO NOT swallow. Repeat nightly.) 1 Tube prn  . Julian Brown RESPIMAT 1.25 MCG/ACT AERS USE 2 PUFFS EACH DAY (Patient taking differently: Inhale 2 puffs into the lungs daily. ) 4 g 5   No current facility-administered medications for this encounter.    Physical Findings:   Wt Readings from Last 3 Encounters:  11/06/20 187 lb 6 oz (85 kg)  09/16/20 191 lb 6.4 oz (86.8 kg)  05/12/20 186 lb 12.8 oz (84.7 kg)    height is 5\' 10"  (1.778 m) and weight is 187 lb 6 oz (85 kg). His temporal temperature is 97 F (36.1 C) (abnormal). His blood pressure is 152/71 (abnormal) and his pulse is 75. His respiration is 18 and oxygen saturation is 95%. .  General:   He is nontoxic-appearing.  He is ambulatory.  He is in no acute distress  HEENT: No oral or upper throat lesions. Skin intact and well healed over neck. He still has some hoarseness.  Partials removed for exam  Neck: No palpable adenopathy  Heart: Regular in rate and rhythm  Chest clear to auscultation bilaterally  PROCEDURE NOTE: After obtaining consent and anesthetizing the nasal cavity with topical oxymetazoline, the flexible endoscope was introduced and passed through the nasal cavity.  The nasopharynx, oropharynx,  hypopharynx and larynx were then examined.  Exam notable for left supraglottic (arytenoid) impaired mobility, consistent with previous exam.  No nodules, lesions, or ulcers visualized throughout the examination.  Lab Findings: Lab Results  Component Value Date   WBC 6.3 11/06/2019   HGB 14.6 11/06/2019   HCT 45.0 11/06/2019   MCV 101.4 (H) 11/06/2019   PLT 310 11/06/2019    Lab Results  Component Value Date   TSH 1.905 11/06/2020    Radiographic Findings: No results found.  Impression/Plan: Previous history of throat cancer. Cancer Staging Malignant neoplasm of supraglottis [facility]) Staging form: Larynx - Supraglottis, AJCC 8th Edition - Clinical stage from 08/06/2019: Stage III (cT3, cN0, cM0) - Signed by Julian Gibson, MD on 08/08/2019 Stage prefix: Initial diagnosis  Head and neck oncology status: No evidence of disease  Nutrition: He is doing well.  His PEG is been removed Wt Readings from Last 3 Encounters:  11/06/20 187 lb 6 oz (85 kg)  09/16/20 191 lb 6.4 oz (86.8 kg)  05/12/20 186 lb  12.8 oz (84.7 kg)    Speech and swallowing: Continue following with Julian. Rowe Brown at [facility]  Thyroid function: Check annually, normal function today Lab Results  Component Value Date   TSH 1.905 11/06/2020    Follow-up plans:  I will see him back in Dec 2022.  On date of service, in total, I spent 30 minutes on this encounter. Patient seen in person. _____________________________________   Julian Gibson, MD

## 2020-11-16 NOTE — Progress Notes (Signed)
Oncology Nurse Navigator Documentation  At the request of Dr. Isidore Moos I have scheduled Julian Brown to see Dr. Nicolette Bang at [facility] on 02/24/21 for follow up. I spoke with Julian Brown wife to inform her of the appointment and they were agreeable to the date and time. She knows to call me if she has any further needs or concerns.   Julian Asa RN, BSN, OCN Head & Neck Oncology Nurse [place] at [facility] Phone # [phone]  Fax # [phone]

## 2021-01-08 ENCOUNTER — Telehealth: Payer: Self-pay | Admitting: Orthopaedic Surgery

## 2021-01-08 NOTE — Telephone Encounter (Signed)
Patient's wife called. Would like to know if patient is able to get a gel injection or not? Call back number is [phone]

## 2021-01-08 NOTE — Telephone Encounter (Signed)
Patient wife aware of the below message from Dr. Ninfa Linden

## 2021-01-19 ENCOUNTER — Ambulatory Visit: Payer: Medicare Other | Admitting: Orthopaedic Surgery

## 2021-06-01 ENCOUNTER — Ambulatory Visit
Admission: RE | Admit: 2021-06-01 | Discharge: 2021-06-01 | Disposition: A | Discharge status: Home / Self Care | Payer: Medicare Other | Source: Ambulatory Visit | Attending: Internal Medicine | Admitting: Internal Medicine

## 2021-06-01 DIAGNOSIS — R918 Other nonspecific abnormal finding of lung field: Secondary | ICD-10-CM

## 2021-06-03 ENCOUNTER — Telehealth: Payer: Self-pay | Admitting: *Deleted

## 2021-06-03 NOTE — Telephone Encounter (Signed)
CALLED PATIENT TO REMIND OF LAB FOR 06-04-21 @ 1:45 PM, VM FULL, UNABLE TO LEAVE MESSAGE

## 2021-06-04 ENCOUNTER — Encounter: Payer: Self-pay | Admitting: Radiation Oncology

## 2021-06-04 ENCOUNTER — Other Ambulatory Visit: Payer: Self-pay

## 2021-06-04 ENCOUNTER — Telehealth: Payer: Self-pay | Admitting: *Deleted

## 2021-06-04 ENCOUNTER — Ambulatory Visit
Admission: RE | Admit: 2021-06-04 | Discharge: 2021-06-04 | Disposition: A | Discharge status: Home / Self Care | Payer: Medicare Other | Source: Ambulatory Visit | Attending: Radiation Oncology | Admitting: Radiation Oncology

## 2021-06-04 VITALS — BP 165/81 | HR 72 | Temp 97.6°F | Resp 24 | Ht 71.0 in | Wt 197.0 lb

## 2021-06-04 DIAGNOSIS — C321 Malignant neoplasm of supraglottis: Secondary | ICD-10-CM | POA: Insufficient documentation

## 2021-06-04 DIAGNOSIS — Z923 Personal history of irradiation: Secondary | ICD-10-CM | POA: Diagnosis not present

## 2021-06-04 DIAGNOSIS — Z1329 Encounter for screening for other suspected endocrine disorder: Secondary | ICD-10-CM | POA: Insufficient documentation

## 2021-06-04 DIAGNOSIS — I10 Essential (primary) hypertension: Secondary | ICD-10-CM | POA: Insufficient documentation

## 2021-06-04 LAB — TSH: TSH: 1.403 u[IU]/mL (ref 0.320–4.118)

## 2021-06-04 MED ORDER — OXYMETAZOLINE HCL 0.05 % NA SOLN
2.0000 | Freq: Once | NASAL | Status: AC
  Administered 2021-06-04: 16:00:00 2 via NASAL
  Filled 2021-06-04: qty 30

## 2021-06-04 NOTE — Progress Notes (Addendum)
Mr. Langenbach presents today for follow up after completing radiation to the larynx on 10/08/2019  Pain issues, if any: Patient denies Using a feeding tube?: N/A Weight changes, if any:  Wt Readings from Last 3 Encounters:  06/04/21 197 lb (89.4 kg)  11/06/20 187 lb 6 oz (85 kg)  09/16/20 191 lb 6.4 oz (86.8 kg)   Swallowing issues, if any: Patient denies. Reports a healthy appetite and states he can eat/drink whatever he's in the mood for Smoking or chewing tobacco? None Using fluoride trays daily?  N/A--uses fluoride toothpaste. Sees a dentist regularly throughout the year Last ENT visit was on: 02/24/2021 Saw Dr. Francina Ames and Dr. Mila Homer --(Dr. Nicolette Bang) Impression  Left posterior laryngeal squamous cell carcinoma , with true vocal cord / arytenoid paralysis  He underwent RT, completed 10/08/19 He has done well since the last visit.  Able to swallow all types of foods. No signs of aspiration.  No palpable neck masses or enlarged lymph nodes  Voice remains fairly hoarse but stable  Denies stridor, dyspnea, hemoptyis  There is no evidence of disease on exam or endoscopy today.  He sees Dr Isidore Moos in December Return in February, certainly sooner if there are any questions or problems. --(Dr. Rowe Clack) Impressions: This patient was evaluated via FEES today I have personally reviewed and interpreted FEES examination and discussed findings with examining SLP.  Oropharyngeal swallowing function: improvement  Masses or lesions: no but significant glottic web - airway stable Vocal fold motion: altered Aspiration: no Penetration: stable  --PLAN: I have discussed the above assessment with patient and/or caregiver. I have discussed findings of FEES with patient and family member.  Continue therapy with SLP  Patient improving.  NED today I have asked the patient to follow up in 6 months or sooner if needed.   Other notable issues, if any: Denies any ear or jaw pain, or  difficulty opening his mouth. Reports occasional dry mouth (mainly at night due to supplemental oxygen). Denies any issues or concerns with lymphedema under his chin or down his neck. Overall, reports he feels good and is pleased with his continued progress

## 2021-06-04 NOTE — Telephone Encounter (Signed)
CALLED PATIENT TO REMIND OF LAB FOR 06-04-21 @ 1:45 PM, LVM FOR A RETURN CALL

## 2021-06-04 NOTE — Progress Notes (Signed)
Radiation Oncology         [phone] ________________________________  Name: Julian Brown MRN: [id number]  Date: 06/04/2021  DOB: March 05, 1933  Follow-Up Visit Note  CC: Julian Solian, MD  Julian Solian, MD  Diagnosis and Prior Radiotherapy:       ICD-10-CM   1. Malignant neoplasm of supraglottis ([facility])  C32.1 oxymetazoline (AFRIN) 0.05 % nasal spray 2 spray    Fiberoptic laryngoscopy       Cancer Staging:  Cancer Staging Malignant neoplasm of supraglottis ([place]) Staging form: Larynx - Supraglottis, AJCC 8th Edition - Clinical stage from 08/06/2019: Stage III (cT3, cN0, cM0) - Signed by Eppie Gibson, MD on 08/08/2019  Radiation Treatment Dates: 08/21/2019 through 10/08/2019  Site Technique Total Dose (Gy) Dose per Fx (Gy) Completed Fx Beam Energies  Larynx: HN_Larynx IMRT 70/70 2 35/35 6X     CHIEF COMPLAINT: Throat cancer  Narrative:  Mr. Julian Brown presents today for follow up after completing radiation to the larynx on 10/08/2019  Pain issues, if any: Patient denies Using a feeding tube?: N/A Weight changes, if any:  Wt Readings from Last 3 Encounters:  06/04/21 197 lb (89.4 kg)  11/06/20 187 lb 6 oz (85 kg)  09/16/20 191 lb 6.4 oz (86.8 kg)   Swallowing issues, if any: Patient denies. Reports a healthy appetite and states he can eat/drink whatever he's in the mood for Smoking or chewing tobacco? None Using fluoride trays daily?  N/A--uses fluoride toothpaste. Sees a dentist regularly throughout the year Last ENT visit was on: 02/24/2021 Saw Dr. Francina Brown and Dr. Mila Brown --(Dr. Nicolette Brown) Impression  Left posterior laryngeal squamous cell carcinoma , with true vocal cord / arytenoid paralysis  He underwent RT, completed 10/08/19 He has done well since the last visit.  Able to swallow all types of foods. No signs of aspiration.  No palpable neck masses or enlarged lymph nodes  Voice remains fairly hoarse but stable  Denies stridor, dyspnea,  hemoptyis  There is no evidence of disease on exam or endoscopy today.  He sees Dr Julian Brown in December Return in February, certainly sooner if there are any questions or problems. --(Dr. Rowe Brown) Impressions: This patient was evaluated via FEES today I have personally reviewed and interpreted FEES examination and discussed findings with examining SLP.  Oropharyngeal swallowing function: improvement  Masses or lesions: no but significant glottic web - airway stable Vocal fold motion: altered Aspiration: no Penetration: stable  --PLAN: I have discussed the above assessment with patient and/or caregiver. I have discussed findings of FEES with patient and family member.  Continue therapy with SLP  Patient improving.  NED today I have asked the patient to follow up in 6 months or sooner if needed.   Other notable issues, if any: Denies any ear or jaw pain, or difficulty opening his mouth. Reports occasional dry mouth (mainly at night due to supplemental oxygen). Denies any issues or concerns with lymphedema under his chin or down his neck. Overall, reports he feels good and is pleased with his continued progress   ALLERGIES:  is allergic to fenofibrate, other, amoxicillin-pot clavulanate, and penicillins.  Meds: Current Outpatient Medications  Medication Sig Dispense Refill   acetaminophen (TYLENOL) 500 MG tablet Take 1,000 mg by mouth every 6 (six) hours as needed for moderate pain.     aspirin EC 81 MG tablet Take 81 mg by mouth daily.     BREO ELLIPTA 100-25 MCG/INH AEPB INHALE 1 PUFF INTO THE LUNGS ONCE DAILY. (Patient  taking differently: Inhale 1 puff into the lungs daily. ) 60 each 5   Cholecalciferol (VITAMIN D) 50 MCG (2000 UT) tablet Take 2,000 Units by mouth daily.     fluticasone (FLONASE) 50 MCG/ACT nasal spray Place 1 spray into both nostrils daily as needed for allergies.      Multiple Vitamins-Minerals (PRESERVISION AREDS 2) CAPS Take 1 tablet by mouth daily.     Polyethyl  Glycol-Propyl Glycol (SYSTANE OP) Place 1 drop into both eyes daily as needed (dry eyes).     rosuvastatin (CRESTOR) 10 MG tablet Take 10 mg by mouth daily.     sodium fluoride (PREVIDENT 5000 PLUS) 1.1 % CREA dental cream Apply to tooth brush. Brush teeth for 2 minutes. Spit out excess-DO NOT swallow. Repeat nightly. (Patient taking differently: Place 1 application onto teeth at bedtime. Apply to tooth brush. Brush teeth for 2 minutes. Spit out excess-DO NOT swallow. Repeat nightly.) 1 Tube prn   SPIRIVA RESPIMAT 1.25 MCG/ACT AERS USE 2 PUFFS EACH DAY (Patient taking differently: Inhale 2 puffs into the lungs daily. ) 4 g 5   No current facility-administered medications for this encounter.    Physical Findings:   Wt Readings from Last 3 Encounters:  06/04/21 197 lb (89.4 kg)  11/06/20 187 lb 6 oz (85 kg)  09/16/20 191 lb 6.4 oz (86.8 kg)    height is 5\' 11"  (1.803 m) and weight is 197 lb (89.4 kg). His temperature is 97.6 F (36.4 C). His blood pressure is 165/81 (abnormal) and his pulse is 72. His respiration is 24 (abnormal) and oxygen saturation is 95%. .  General:   He is nontoxic-appearing.  He is ambulatory.  He is in no acute distress  HEENT: No oral or upper throat lesions. Skin intact and well healed over neck. He still has some hoarseness.  Partials removed for exam  Neck: No palpable adenopathy  Heart: Regular in rate and rhythm  Chest clear to auscultation bilaterally  PROCEDURE NOTE: After obtaining consent and anesthetizing the nasal cavity with topical oxymetazoline, the flexible endoscope was introduced and passed through the nasal cavity.  The nasopharynx, oropharynx, hypopharynx and larynx were then examined.  Exam notable for left supraglottic (arytenoid) impaired mobility, consistent with previous exam.  No nodules, lesions, or ulcers visualized throughout the examination.  Lab Findings: Lab Results  Component Value Date   WBC 6.3 11/06/2019   HGB 14.6 11/06/2019    HCT 45.0 11/06/2019   MCV 101.4 (H) 11/06/2019   PLT 310 11/06/2019    Lab Results  Component Value Date   TSH 1.403 06/04/2021    Radiographic Findings: CT Chest Wo Contrast  Result Date: 06/02/2021 CLINICAL DATA:  Abnormal chest radiograph, lung nodule. Hoarseness. Colon, prostate and head neck cancer. EXAM: CT CHEST WITHOUT CONTRAST TECHNIQUE: Multidetector CT imaging of the chest was performed following the standard protocol without IV contrast. COMPARISON:  06/01/2020. FINDINGS: Cardiovascular: Atherosclerotic calcification of the aorta, aortic valve and coronary arteries. Enlarged pulmonic trunk. Heart is at the upper limits of normal in size. No pericardial effusion. Mediastinum/Nodes: Low-attenuation thyroid nodules measure up to 1.8 cm on the left, previously evaluated by ultrasound and biopsy on 04/19/2017 and 05/03/2017, respectively. No pathologically enlarged mediastinal or axillary lymph nodes. Hilar regions are difficult to evaluate without IV contrast but appear grossly unremarkable. Esophagus is grossly unremarkable. Lungs/Pleura: Centrilobular and paraseptal emphysema. Chronic right middle lobe subtotal volume loss. Scarring in the lower lobes and lingula. Clustered micronodularity in the lingula (8/78), unchanged  and benign. No pleural fluid. Debris is seen in the airway. Upper Abdomen: Numerous well-circumscribed low-attenuation lesions in the liver measure up to 4.5 cm in the inferior right hepatic lobe, similar and likely cysts. Cholecystectomy. Right adrenal gland is unremarkable. Nodular thickening of the left adrenal gland. Visualized portions of the kidneys, spleen, pancreas, stomach and bowel are otherwise unremarkable. Juxtadiaphragmatic lymph nodes measure up to 11 mm, unchanged. Musculoskeletal: Degenerative changes in the spine. No worrisome lytic or sclerotic lesions. IMPRESSION: 1. No suspicious pulmonary nodules. 2. Aortic atherosclerosis (ICD10-I70.0). Coronary  artery calcification. 3. Enlarged pulmonic trunk, indicative of pulmonary arterial hypertension. 4.  Emphysema (ICD10-J43.9). Electronically Signed   By: Lorin Picket M.D.   On: 06/02/2021 11:06    Impression/Plan: Previous history of throat cancer.  Cancer Staging  Malignant neoplasm of supraglottis [facility]) Staging form: Larynx - Supraglottis, AJCC 8th Edition - Clinical stage from 08/06/2019: Stage III (cT3, cN0, cM0) - Signed by Eppie Gibson, MD on 08/08/2019 Stage prefix: Initial diagnosis  Head and neck oncology status: No evidence of disease with good QOL  Nutrition: He is doing well.  His PEG has been removed Wt Readings from Last 3 Encounters:  06/04/21 197 lb (89.4 kg)  11/06/20 187 lb 6 oz (85 kg)  09/16/20 191 lb 6.4 oz (86.8 kg)    Speech and swallowing: Continue following with Dr. Rowe Brown at [facility]  Thyroid function: Check annually with PCP, normal function today. Pt can alternatively check TSH in our survivorship clinic. Lab Results  Component Value Date   TSH 1.403 06/04/2021    Dental: Use fluoride mouthwash without alcohol nightly, follow w/ dentist. Dentist should contact me if considering extractions.  Written instructions: I wrote instructions for the patient and his wife regarding my recommendations   Follow-up plans:  We will enroll him in our survivorship clinic (to be seen in 51mo).  On date of service, in total, I spent 30 minutes on this encounter. Patient seen in person. _____________________________________   Eppie Gibson, MD

## 2021-06-18 ENCOUNTER — Encounter: Payer: Self-pay | Admitting: Internal Medicine

## 2021-06-18 ENCOUNTER — Ambulatory Visit (INDEPENDENT_AMBULATORY_CARE_PROVIDER_SITE_OTHER): Payer: Medicare Other | Admitting: Internal Medicine

## 2021-06-18 ENCOUNTER — Other Ambulatory Visit: Payer: Self-pay

## 2021-06-18 VITALS — BP 130/78 | HR 63 | Temp 98.3°F | Ht 71.0 in | Wt 179.6 lb

## 2021-06-18 DIAGNOSIS — R918 Other nonspecific abnormal finding of lung field: Secondary | ICD-10-CM

## 2021-06-18 DIAGNOSIS — R490 Dysphonia: Secondary | ICD-10-CM

## 2021-06-18 DIAGNOSIS — Z7185 Encounter for immunization safety counseling: Secondary | ICD-10-CM | POA: Diagnosis not present

## 2021-06-18 DIAGNOSIS — J449 Chronic obstructive pulmonary disease, unspecified: Secondary | ICD-10-CM

## 2021-06-18 MED ORDER — SPIRIVA RESPIMAT 2.5 MCG/ACT IN AERS: 2.0000 | INHALATION_SPRAY | Freq: Every day | RESPIRATORY_TRACT | 0 refills | Status: DC

## 2021-06-18 NOTE — Patient Instructions (Addendum)
Multiple lung nodules on CT - stable jan 2019 to Nov 2019 with new 9mmLUL nodule -> stable Nov 2020 Hx of supraglottis cancer  -No lung nodules and CT scan of the chest December 2022  Plan  -Expectant follow-up  Stage 2 moderate COPD by GOLD classification ([place]) Emphysema Nocturnal hypoxemia  - stable   You are not on your inhalers spiriva and  breo scheduled but I do think it can help you  Plan -  Continue  o2 at night  - Restart Spiriva Respimat [take samples]   Hoarse voice - throat cancer s/p XRT Spring 2021  Plan  - per ENT  [facility] counseling  Plan   Please have covid bivalent mRNA booster  followup 6 months or sooner; COPD CAT score at follow-up returns for follow-up

## 2021-06-18 NOTE — Progress Notes (Signed)
OV 03/10/2020  Subjective:  Patient ID: Julian Brown, male , DOB: 1932/06/24 , age 88 y.o. , MRN: [id number] , ADDRESS: [place] [facility] [place] [address]   03/10/2020 -   Chief Complaint  Patient presents with   Follow-up    no problems     HPI Julian Brown y.o. - OV 04/23/2019  Subjective:  Patient ID: Julian Brown, male , DOB: Apr 06, 1933 , age 20 y.o. , MRN: [id number] , ADDRESS: [place] [address]   04/23/2019 -   Chief Complaint  Patient presents with   Follow-up    Pt states he has been doing well since last visit. Pt's wife says that pt has been coughing a lot with white to yellow phlegm and has SOB with activities.   COPD and nodule follow-up  HPI Julian Brown 88 y.o. -presents with his wife.  He is not giving much of a history.  Wife is giving most of the history.  She is very concerned.  She says that in the summer 2020 he decompensated with increased cough and congestion.  Saw primary care physician x-ray was normal.  Does not recollect if he got antibiotics and steroids.  Now for the last few weeks he has had increased cough, congestion, increased urine volume but no change in sputum color or wheezing or shortness of breath.  COPD CAT score is 13 but this is filled by him.  She is reporting weight more symptoms.  He is compliant with the Spiriva and Breo.  In addition she is asking about the impact of seasonal allergies on this and is requesting allergy testing.  He is using nighttime oxygen.  She is asking about having a portable indigent system.  Walking desaturation test was abnormal a few years ago.  But she says the canisters are all heavy.  He is not using portable oxygen.  Do not know why.  They are interested in this  Lung nodule.  This is stable: Results are below.  New issue: Reporting hoarseness of voice since gallbladder surgery in December 2018.  Says saw ENT over a year ago and was told that the vocal  cords were red but no nodules or any other abnormalities.  Does not remember any strictures.    OV 03/10/2020   Subjective:  Patient ID: Julian Brown, male , DOB: 13-Sep-1932, age 43 y.o. years. 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 -presents with his wife.  Wife gives most of the history.  I personally last saw the patient in end of 2020.  After that I referred him to ENT.  Appears he has been diagnosed with supraglottis cancer.  He has seen Julian Brown 3 times after that.  As of spring 2020 when he is finished radiation.  His voice continues to be hoarse and does not return back to baseline.  Apparently voice rehabilitation is an option for them.  During all this he is now top taking his Spiriva and Breo.  His wife does  report dyspnea on exertion that may be slightly worse than before.  However is not any inhalers.  Also because of all this he stopped doing exercise program.  He does have arthritis and is somewhat of an antalgic gait but is able to do bike riding on a stationary bike if he can have access to 1.  In addition he is able to do all his ADLs.  He has had his Covid vaccine but currently meets indication for booster.  They have been counseled about it.   Results for MAXIMUS, HOFFERT (MRN [id number]) as of 11/20/2017 11:02  Ref. Range 11/20/2017 09:12  FEV1-Post Latest Units: L 1.43  FEV1-%Pred-Post Latest Units: % 54  FEV1-%Change-Post Latest Units: % 5  Post FEV1/FVC ratio Latest Units: % 61   FEV1FVC-%Change-Post Latest Units: % 0  Results for [name], [name] (MRN [id number]) as of 11/20/2017 11:02  Ref. Range 11/20/2017 09:12  DLCO unc Latest Units: ml/min/mmHg 12.99  DLCO unc % pred Latest Units: % 41    Simple office walk 185 feet x  3 laps goal with forehead probe 10/25/2017  11/20/2017  04/23/2019  O2 used Room air Room air Room air  Number laps completed 2 and desaturated 2 and desaturated 1 and deaurates  Comments about pace Normal with limp Normal pace normla pace  Resting Pulse Ox/HR 96% and 63/min 95% and 61/min 97% and 75/min  Final Pulse Ox/HR 84% and 81/min 87% and 85 at 2nd lap 88% and and 89/min  Desaturated </= 88% yes yes   Desaturated <= 3% points yes yes   Got Tachycardic >/= 90/min no no   Symptoms at end of test No complaints No complaints dyspnea  Miscellaneous comments Corrected with 2L O2 correcte with 2nd lap mild      IMPRESSION: CT chest nov 2020 1. Stable tiny left upper lobe pulmonary nodules. Interval 1 year stability consistent with benign etiology. No new or progressive findings on today's study. 2. Stable bilateral thyroid nodules. 3. Aortic Atherosclerosis (ICD10-I70.0) and Emphysema (ICD10-J43.9).     Electronically Signed   By: Julian Brown M.D.   On: 04/16/2019 13:06     No flowsheet data found.     ROS - per HPI   OV 06/18/2021  Subjective:  Patient ID: Julian Brown, male , DOB: Jul 29, 1932 , age 76 y.o. , MRN: [id number] , ADDRESS: [place] [address] PCP Julian Solian, MD Patient Care Team: Julian Solian, MD as PCP - General (Internal Medicine) Julian Ames, MD as Referring Physician (Otolaryngology) Julian Gibson, MD as Attending Physician (Radiation Oncology) Julian Sauers, RN (Inactive) as Oncology Nurse Navigator Julian Brown, RD as Dietitian (Nutrition) Julian Brown, Julian Brown as Speech Language Pathologist (Speech Pathology) Julian Brown, Julian Brown, PT as  Physical Therapist (Physical Therapy) Amedeo Kinsman, LCSW as Social Worker Julian Brown, Julian Police, RN as Oncology Nurse Navigator (Oncology)  This Provider for this visit: Treatment Team:  Attending Provider: Brand Males, MD    06/18/2021 -   Chief Complaint  Patient presents with   Follow-up    CT recently performed. Pt states he has been doing okay since last visit and denies any complaints.   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 -presents with his wife.  Have not seen him in 2 years.  He  is doing well.  For his COPD is not taking any inhalers.  I did express to him that he would benefit from some inhalers.  He does have some mild shortness of breath but more moderate when he climbs stairs no cough or wheezing.  He is agreed to take Spiriva.  He did have a CT scan of the chest that are normal lung nodules there is no cancer there is no ILD.  He and his wife stated that they did have COVID in the spring 2021 he has not had his by Valent mRNA booster.  I did advise that it would be beneficial.  They are willing to do that.  He has had his flu shot.   No flowsheet data found.     CAT COPD Symptom & Quality of Life Score (GSK trademark) 0 is no burden. 5 is highest burden 04/23/2019   Never Cough -> Cough all the time 3  No phlegm in chest -> Chest is full of phlegm 2  No chest tightness -> Chest feels very tight 1  No dyspnea for 1 flight stairs/hill -> Very dyspneic for 1 flight of stairs 4  No limitations for ADL at home -> Very limited with ADL at home 0  Confident leaving home -> Not at all confident leaving home 0  Sleep soundly -> Do not sleep soundly because of lung condition 0  Lots of Energy -> No energy at all 3  TOTAL Score (max 40)  13     Simple  office walk 185 feet x  3 laps goal with forehead probe 10/25/2017  11/20/2017  04/23/2019 06/18/2021   O2 used Room air Room air Room air ra  Number laps completed 2 and desaturated 2 and desaturated 1 and deaurates 1 lap only  Comments about pace Normal with limp Normal pace normla pace Slow pace  Resting Pulse Ox/HR 96% and 63/min 95% and 61/min 97% and 75/min 99% and 63  Final Pulse Ox/HR 84% and 81/min 87% and 85 at 2nd lap 88% and and 89/min 93% and 83  Desaturated </= 88% yes yes    Desaturated <= 3% points yes yes    Got Tachycardic >/= 90/min no no    Symptoms at end of test No complaints No complaints dyspnea Mil dyspnea  Miscellaneous comments Corrected with 2L O2 correcte with 2nd lap mild Slow pace due to knee pain   CT Chest data - Dec 2022  IMPRESSION: 1. No suspicious pulmonary nodules. 2. Aortic atherosclerosis (ICD10-I70.0). Coronary artery calcification. 3. Enlarged pulmonic trunk, indicative of pulmonary arterial hypertension. 4.  Emphysema (ICD10-J43.9).     Electronically Signed   By: Lorin Picket M.D.   On: 06/02/2021 11:06  No results found.    PFT  PFT Results Latest Ref Rng & Units 11/20/2017  FVC-Pre L 2.23  FVC-Predicted Pre % 59  FVC-Post L 2.36  FVC-Predicted Post % 62  Pre FEV1/FVC % % 61  Post FEV1/FCV % % 61  FEV1-Pre L 1.35  FEV1-Predicted Pre % 51  FEV1-Post L 1.43  DLCO uncorrected ml/min/mmHg 12.99  DLCO UNC% % 41  DLVA Predicted % 70  TLC L 5.79  TLC % Predicted % 84  RV % Predicted % 125       has a past medical history of Acute gangrenous cholecystitis s/p lap cholecystectomy 06/08/2017 (06/08/2017), Arthritis, Colon cancer ([place]), ED (erectile dysfunction), Emphysema of lung ([place]), Hyperlipemia, Hypertension, Prostate cancer ([place]), and Wears glasses.   reports that  he quit smoking about 34 years ago. His smoking use included cigarettes. He has a 36.00 pack-year smoking history. He has never used smokeless tobacco.  Past  Surgical History:  Procedure Laterality Date   APPENDECTOMY     CARPAL TUNNEL RELEASE  5/12   lt   CARPAL TUNNEL RELEASE  12/13/2011   Procedure: CARPAL TUNNEL RELEASE;  Surgeon: [name]., MD;  Location: [facility];  Service: Orthopedics;  Laterality: Right;  and inject right wrist   CHOLECYSTECTOMY N/A 06/08/2017   Procedure: LAPAROSCOPIC CHOLECYSTECTOMY WITH LYSIS OF ADHESIONS;  Surgeon: [name], MD;  Location: [facility];  Service: General;  Laterality: N/A;   [place]   hemicolectomy-rt-ca   COLONOSCOPY     about 12 inches of colon removed due to colon cancer   IR GASTROSTOMY TUBE MOD SED  11/06/2019   IR GASTROSTOMY TUBE REMOVAL  01/22/2020    Allergies  Allergen Reactions   Fenofibrate     Other reaction(s): muscle aches   Other Other (See Comments)   Amoxicillin-Pot Clavulanate Rash   Penicillins Swelling    Has patient had a PCN reaction causing immediate rash, facial/tongue/throat swelling, SOB or lightheadedness with hypotension: no (lip swelling "as soon as the pill touched my mouth so I didn't swallow it") Has patient had a PCN reaction causing severe rash involving mucus membranes or skin necrosis: no Has patient had a PCN reaction that required hospitalization: no Has patient had a PCN reaction occurring within the last 10 years: no If all of the above answers are "NO", then may proceed with Ceph    Immunization History  Administered Date(s) Administered   Fluad Quad(high Dose 65+) 05/14/2019   Influenza, High Dose Seasonal PF 02/11/2017, 04/18/2018   Influenza, Quadrivalent, Recombinant, Inj, Pf 03/13/2020, 03/16/2021   PFIZER(Purple Top)SARS-COV-2 Vaccination 08/20/2019, 09/10/2019, 03/27/2020   Pneumococcal Conjugate-13 12/05/2013, 11/06/2014   Tdap 07/06/2009   Zoster, Live 12/01/2009    No family history on file.   Current Outpatient Medications:    Tiotropium Bromide Monohydrate (SPIRIVA RESPIMAT) 2.5 MCG/ACT AERS,  Inhale 2 puffs into the lungs daily., Disp: 4 g, Rfl: 0   acetaminophen (TYLENOL) 500 MG tablet, Take 1,000 mg by mouth every 6 (six) hours as needed for moderate pain. (Patient not taking: Reported on 06/18/2021), Disp: , Rfl:    aspirin EC 81 MG tablet, Take 81 mg by mouth daily. (Patient not taking: Reported on 06/18/2021), Disp: , Rfl:    BREO ELLIPTA 100-25 MCG/INH AEPB, INHALE 1 PUFF INTO THE LUNGS ONCE DAILY. (Patient not taking: Reported on 06/18/2021), Disp: 60 each, Rfl: 5   Cholecalciferol (VITAMIN D) 50 MCG (2000 UT) tablet, Take 2,000 Units by mouth daily. (Patient not taking: Reported on 06/18/2021), Disp: , Rfl:    fluticasone (FLONASE) 50 MCG/ACT nasal spray, Place 1 spray into both nostrils daily as needed for allergies.  (Patient not taking: Reported on 06/18/2021), Disp: , Rfl:    Multiple Vitamins-Minerals (PRESERVISION AREDS 2) CAPS, Take 1 tablet by mouth daily. (Patient not taking: Reported on 06/18/2021), Disp: , Rfl:    Polyethyl Glycol-Propyl Glycol (SYSTANE OP), Place 1 drop into both eyes daily as needed (dry eyes). (Patient not taking: Reported on 06/18/2021), Disp: , Rfl:    rosuvastatin (CRESTOR) 10 MG tablet, Take 10 mg by mouth daily. (Patient not taking: Reported on 06/18/2021), Disp: , Rfl:    sodium fluoride (PREVIDENT 5000 PLUS) 1.1 % CREA dental cream, Apply to tooth brush. Brush  teeth for 2 minutes. Spit out excess-DO NOT swallow. Repeat nightly. (Patient not taking: Reported on 06/18/2021), Disp: 1 Tube, Rfl: prn      Objective:   Vitals:   06/18/21 1501  BP: 130/78  Pulse: 63  Temp: 98.3 F (36.8 C)  TempSrc: Oral  SpO2: 99%  Weight: 179 lb 9.6 oz (81.5 kg)  Height: 5\' 11"  (1.803 m)    Estimated body mass index is 25.05 kg/m as calculated from the following:   Height as of this encounter: 5\' 11"  (1.803 m).   Weight as of this encounter: 179 lb 9.6 oz (81.5 kg).  @WEIGHTCHANGE @  [employer]   06/18/21 1501  Weight: 179 lb 9.6 oz (81.5 kg)     Physical  Exam    General: No distress.  Thin. HOARSE Neuro: Alert and Oriented x 3. GCS 15. Speech normal Psych: Pleasant Resp:  Barrel Chest - no.  Wheeze - no, Crackles - no, No overt respiratory distress CVS: Normal heart sounds. Murmurs - no Ext: Stigmata of Connective Tissue Disease -slow gait because of knee issues HEENT: Normal upper airway. PEERL +. No post nasal drip        Assessment:       ICD-10-CM   1. Stage 2 moderate COPD by GOLD classification (HCC)  J44.9     2. Abnormal findings on diagnostic imaging of lung  R91.8     3. Hoarseness of voice  R49.0     4. Vaccine counseling  Z71.85          Plan:     Patient Instructions  Multiple lung nodules on CT - stable jan 2019 to Nov 2019 with new 32mmLUL nodule -> stable Nov 2020 Hx of supraglottis cancer  -No lung nodules and CT scan of the chest December 2022  Plan  -Expectant follow-up  Stage 2 moderate COPD by GOLD classification ([place]) Emphysema Nocturnal hypoxemia  - stable   You are not on your inhalers spiriva and  breo scheduled but I do think it can help you  Plan -  Continue  o2 at night  - Restart Spiriva Respimat [take samples]   Hoarse voice - throat cancer s/p XRT Spring 2021  Plan  - per ENT  [facility] counseling  Plan   Please have covid bivalent mRNA booster  followup 6 months or sooner; COPD CAT score at follow-up returns for follow-up    SIGNATURE    Dr. Brand Brown, M.D., F.C.C.P,  Pulmonary and Critical Care Medicine Staff Physician, [place] Director - Interstitial Lung Disease  Program  Pulmonary [place] [facility] [place], [place], [address]  Pager: [phone], If no answer or between  15:00h - 7:00h: call [phone] Telephone: [phone]  3:38 PM 06/18/2021

## 2021-10-27 ENCOUNTER — Telehealth: Payer: Self-pay | Admitting: Adult Health

## 2021-10-27 NOTE — Telephone Encounter (Signed)
Rescheduled appointment per provider template. Unable to leave a voicemail due to mailbox being full. Patient will be mailed an updated calendar. ?

## 2021-11-29 ENCOUNTER — Ambulatory Visit (INDEPENDENT_AMBULATORY_CARE_PROVIDER_SITE_OTHER): Payer: Medicare Other | Admitting: Orthopaedic Surgery

## 2021-11-29 DIAGNOSIS — M1712 Unilateral primary osteoarthritis, left knee: Secondary | ICD-10-CM

## 2021-11-29 DIAGNOSIS — G8929 Other chronic pain: Secondary | ICD-10-CM | POA: Diagnosis not present

## 2021-11-29 DIAGNOSIS — M25561 Pain in right knee: Secondary | ICD-10-CM

## 2021-11-29 DIAGNOSIS — M1711 Unilateral primary osteoarthritis, right knee: Secondary | ICD-10-CM

## 2021-11-29 DIAGNOSIS — M25562 Pain in left knee: Secondary | ICD-10-CM

## 2021-11-29 MED ORDER — LIDOCAINE HCL 1 % IJ SOLN
3.0000 mL | INTRAMUSCULAR | Status: AC | PRN
  Administered 2021-11-29: 3 mL

## 2021-11-29 MED ORDER — METHYLPREDNISOLONE ACETATE 40 MG/ML IJ SUSP
40.0000 mg | INTRAMUSCULAR | Status: AC | PRN
  Administered 2021-11-29: 40 mg via INTRA_ARTICULAR

## 2021-11-29 NOTE — Progress Notes (Signed)
Office Visit Note   Patient: Julian Brown           Date of Birth: [birth year]           MRN: [id number] Visit Date: 11/29/2021              Requested by: Prince Solian, MD [address] [place],  [place] [address] PCP: Prince Solian, MD   Assessment & Plan: Visit Diagnoses:  1. Chronic pain of left knee   2. Chronic pain of right knee   3. Primary osteoarthritis of right knee   4. Unilateral primary osteoarthritis, left knee     Plan: Per the patient's request I did place a steroid injection in both knees.  However, I did aspirate the left knee prior to placing steroid injection and did remove 50 cc of clear yellow fluid from the knee consistent with osteoarthritis.  He tolerated the injections well.  He knows to wait at least 3 to 4 months between injections.  Follow-up is as needed otherwise.  Follow-Up Instructions: Return if symptoms worsen or fail to improve.   Orders:  Orders Placed This Encounter  Procedures   Large Joint Inj   Large Joint Inj   No orders of the defined types were placed in this encounter.     Procedures: Large Joint Inj: R knee on 11/29/2021 3:30 PM Indications: diagnostic evaluation and pain Details: 22 G 1.5 in needle, superolateral approach  Arthrogram: No  Medications: 3 mL lidocaine 1 %; 40 mg methylPREDNISolone acetate 40 MG/ML Outcome: tolerated well, no immediate complications Procedure, treatment alternatives, risks and benefits explained, specific risks discussed. Consent was given by the patient. Immediately prior to procedure a time out was called to verify the correct patient, procedure, equipment, support staff and site/side marked as required. Patient was prepped and draped in the usual sterile fashion.    Large Joint Inj: L knee on 11/29/2021 3:30 PM Indications: diagnostic evaluation and pain Details: 22 G 1.5 in needle, superolateral approach  Arthrogram: No  Medications: 3 mL lidocaine 1 %; 40 mg  methylPREDNISolone acetate 40 MG/ML Outcome: tolerated well, no immediate complications Procedure, treatment alternatives, risks and benefits explained, specific risks discussed. Consent was given by the patient. Immediately prior to procedure a time out was called to verify the correct patient, procedure, equipment, support staff and site/side marked as required. Patient was prepped and draped in the usual sterile fashion.       Clinical Data: No additional findings.   Subjective: Chief Complaint  Patient presents with   Right Knee - Pain   Left Knee - Pain  The patient is somewhat of seen in the past.  He has debilitating arthritis involving both his knees.  He is 88 years old.  He is requesting a steroid injection in both knees today.  He has been walking slowly as well.  He is not interested in joint replacement surgery.  HPI  Review of Systems There is no fever, chills, nausea, vomiting  Objective: Vital Signs: There were no vitals taken for this visit.  Physical Exam He is alert and orient x3 and in no acute distress Ortho Exam Examination of his left knee does show a large effusion.  The right knee does not but both knees are painful throughout the arc of motion and have significant pain when I bend his knees.  There is slight flexion contractures of both knees. Specialty Comments:  No specialty comments available.  Imaging: No results found.  PMFS History: Patient Active Problem List   Diagnosis Date Noted   Malignant neoplasm of supraglottis ([place]) 08/08/2019   Hoarseness of voice 06/12/2019   Cough 05/23/2019   Primary osteoarthritis of right wrist 07/31/2018   Stage 2 moderate COPD by GOLD classification ([place]) 05/16/2018   Allergic rhinitis 05/16/2018   Nocturnal hypoxemia 05/16/2018   Abnormal finding on lung imaging 05/16/2018   Hypertension    Hyperlipemia    Acute gangrenous cholecystitis s/p lap cholecystectomy 06/08/2017 06/08/2017   Pain in right  wrist 01/04/2017   Chronic pain of left knee 01/04/2017   Chronic pain of right knee 01/04/2017   Unilateral primary osteoarthritis, left knee 01/04/2017   Unilateral primary osteoarthritis, right knee 01/04/2017   Past Medical History:  Diagnosis Date   Acute gangrenous cholecystitis s/p lap cholecystectomy 06/08/2017 06/08/2017   Arthritis    Colon cancer [facility])    ED (erectile dysfunction)    Emphysema of lung ([place])    Hyperlipemia    Hypertension    Prostate cancer ([place])    prostate cancer-tx radiation   Wears glasses     No family history on file.  Past Surgical History:  Procedure Laterality Date   APPENDECTOMY     CARPAL TUNNEL RELEASE  5/12   lt   CARPAL TUNNEL RELEASE  12/13/2011   Procedure: CARPAL TUNNEL RELEASE;  Surgeon: [name]., MD;  Location: [facility];  Service: Orthopedics;  Laterality: Right;  and inject right wrist   CHOLECYSTECTOMY N/A 06/08/2017   Procedure: LAPAROSCOPIC CHOLECYSTECTOMY WITH LYSIS OF ADHESIONS;  Surgeon: [name], MD;  [facility];  Service: General;  Laterality: N/A;   [place]   hemicolectomy-rt-ca   COLONOSCOPY     about 12 inches of colon removed due to colon cancer   IR GASTROSTOMY TUBE MOD SED  11/06/2019   IR GASTROSTOMY TUBE REMOVAL  01/22/2020   Social History   Occupational History   Not on file  Tobacco Use   Smoking status: Former    Packs/day: 1.00    Years: 36.00    Total pack years: 36.00    Types: Cigarettes    Quit date: 12/09/1986    Years since quitting: 34.9   Smokeless tobacco: Never  Vaping Use   Vaping Use: Never used  Substance and Sexual Activity   Alcohol use: Yes    Comment: occ   Drug use: No   Sexual activity: Not on file

## 2021-12-03 ENCOUNTER — Telehealth: Payer: Self-pay | Admitting: *Deleted

## 2021-12-06 ENCOUNTER — Encounter: Payer: Self-pay | Admitting: Adult Health

## 2021-12-06 ENCOUNTER — Telehealth: Payer: Self-pay

## 2021-12-06 ENCOUNTER — Inpatient Hospital Stay: Payer: Medicare Other | Attending: Adult Health | Admitting: Adult Health

## 2021-12-06 ENCOUNTER — Other Ambulatory Visit: Payer: Self-pay

## 2021-12-06 VITALS — BP 172/69 | HR 89 | Temp 97.9°F | Resp 28 | Ht 71.0 in | Wt 174.3 lb

## 2021-12-06 DIAGNOSIS — C321 Malignant neoplasm of supraglottis: Secondary | ICD-10-CM | POA: Diagnosis present

## 2021-12-06 NOTE — Telephone Encounter (Signed)
Called pt due to medication questions at visit with Mardella Layman, NP. At recent visit with Dr. Felipa Eth, the only medication that pt needs to take is Nitroglycerin PRN for chest pain. No voicemail box available to LM.

## 2021-12-07 ENCOUNTER — Telehealth: Payer: Self-pay | Admitting: Adult Health

## 2021-12-07 NOTE — Telephone Encounter (Signed)
Scheduled appointment per 6/26 los. Unable to leave a voicemail due to the option not given. Phone jut rang and rang. Patient will be mailed an updated calendar.

## 2022-03-10 ENCOUNTER — Telehealth: Payer: Self-pay | Admitting: Internal Medicine

## 2022-03-11 NOTE — Telephone Encounter (Signed)
Patient's daughter is not DPR nor listed as an emergency contact.   Called patient directly x2 and received a busy tone each time. Will attempt to call again later.

## 2022-03-21 NOTE — Telephone Encounter (Signed)
Called patient directly again and he did not answer. Will send letter to patient to call us back.

## 2022-03-23 ENCOUNTER — Telehealth: Payer: Self-pay | Admitting: Internal Medicine

## 2022-03-23 DIAGNOSIS — J449 Chronic obstructive pulmonary disease, unspecified: Secondary | ICD-10-CM

## 2022-03-23 DIAGNOSIS — G4734 Idiopathic sleep related nonobstructive alveolar hypoventilation: Secondary | ICD-10-CM

## 2022-03-23 NOTE — Telephone Encounter (Signed)
This is fine  Thanks    SIGNATURE    Dr. Brand Males, M.D., F.C.C.P,  Pulmonary and Critical Care Medicine Staff Physician, [place] Director - Interstitial Lung Disease  Program  Medical Director - [facility] Pulmonary [place] at [place], [place], [address]   Pager: [phone], If no answer  -[place] or Try [phone] Telephone (clinical office): [phone] Telephone (research): [phone]  4:40 PM 03/23/2022

## 2022-03-23 NOTE — Telephone Encounter (Signed)
Called and spoke with [place]. Patient give [place] permission to speak with [facility]. Jenny Reichmann stated that the patient received a letter from [facility] stating that his equipment is over 86 years old and it is time to upgrade. Jenny Reichmann confirms that his equipment still works but she does not want it to break down. She would like to have an order sent to [employer] for his oxygen. She confirmed that he is on 2L of O2.   MR, please advise if you are ok with [place] placing this oxygen order. Thanks!

## 2022-03-23 NOTE — Telephone Encounter (Signed)
Called and spoke with patient's daughter. She is aware that the order has been placed. Nothing further needed at time of call.

## 2022-05-31 ENCOUNTER — Encounter: Payer: Self-pay | Admitting: Internal Medicine

## 2022-05-31 ENCOUNTER — Ambulatory Visit (INDEPENDENT_AMBULATORY_CARE_PROVIDER_SITE_OTHER): Payer: Medicare Other | Admitting: Internal Medicine

## 2022-05-31 VITALS — BP 132/74 | HR 58 | Temp 97.6°F | Ht 71.0 in | Wt 173.2 lb

## 2022-05-31 DIAGNOSIS — J449 Chronic obstructive pulmonary disease, unspecified: Secondary | ICD-10-CM

## 2022-05-31 DIAGNOSIS — G4734 Idiopathic sleep related nonobstructive alveolar hypoventilation: Secondary | ICD-10-CM

## 2022-05-31 DIAGNOSIS — R490 Dysphonia: Secondary | ICD-10-CM

## 2022-05-31 MED ORDER — SPIRIVA RESPIMAT 1.25 MCG/ACT IN AERS: 2.0000 | INHALATION_SPRAY | Freq: Every day | RESPIRATORY_TRACT | 5 refills | Status: DC

## 2022-05-31 NOTE — Addendum Note (Signed)
Addended by: Lorretta Harp on: 05/31/2022 03:02 PM   Modules accepted: Orders

## 2022-05-31 NOTE — Patient Instructions (Addendum)
Stage 2 moderate COPD by GOLD classification ([place]) Emphysema Nocturnal hypoxemia  - stable   You are not on your inhalers spiriva and  breo scheduled but I do think it can help you - appears you are needing to recertify your night o2 need  Plan -  recheck ONO on room air at night  -do albuterol as needed  - recommend atleast taking spiriva respimat 2 puff scheduled once daily  - CMA to do script - remove BREO off the [facility]   Multiple lung nodules on CT - stable jan 2019 to Nov 2019 with new 10mLUL nodule -> stable Nov 2020 Hx of supraglottis cancer  -No lung nodules and CT scan of the chest December 2022  Plan  -Expectant follow-up   Hoarse voice - throat cancer s/p XRT Spring 2021  Plan  - per ENT  followup 6 months or sooner; COPD CAT score at follow-up returns for follow-up

## 2022-05-31 NOTE — Progress Notes (Signed)
OV 03/10/2020  Subjective:  Patient ID: Julian Brown, male , DOB: 05-07-1933 , age 88 y.o. , MRN: [id number] , ADDRESS: [place] [facility] [id number]   03/10/2020 -   Chief Complaint  Patient presents with   Follow-up    no problems     HPI Julian Brown 88 y.o. - OV 04/23/2019  Subjective:  Patient ID: Julian Brown, male , DOB: [birth year] , age 88 y.o. , MRN: [id number] , ADDRESS: [place] [address]   04/23/2019 -   Chief Complaint  Patient presents with   Follow-up    Pt states he has been doing well since last visit. Pt's wife says that pt has been coughing a lot with white to yellow phlegm and has SOB with activities.   COPD and nodule follow-up  HPI Julian Brown 88 y.o. -presents with his wife.  He is not giving much of a history.  Wife is giving most of the history.  She is very concerned.  She says that in the summer 2020 he decompensated with increased cough and congestion.  Saw primary care physician x-ray was normal.  Does not recollect if he got antibiotics and steroids.  Now for the last few weeks he has had increased cough, congestion, increased urine volume but no change in sputum color or wheezing or shortness of breath.  COPD CAT score is 13 but this is filled by him.  She is reporting weight more symptoms.  He is compliant with the Spiriva and Breo.  In addition she is asking about the impact of seasonal allergies on this and is requesting allergy testing.  He is using nighttime oxygen.  She is asking about having a portable indigent system.  Walking desaturation test was abnormal a few years ago.  But she says the canisters are all heavy.  He is not using portable oxygen.  Do not know why.  They are interested in this  Lung nodule.  This is stable: Results are below.  New issue: Reporting hoarseness of voice since gallbladder surgery in December 2018.  Says saw ENT over a year ago and was told that the vocal  cords were red but no nodules or any other abnormalities.  Does not remember any strictures.    OV 03/10/2020   Subjective:  Patient ID: Julian Brown, male , DOB: [birth year], age 88 y.o. years. 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 -presents with his wife.  Wife gives most of the history.  I personally last saw the patient in end of 2020.  After that I referred him to ENT.  Appears he has been diagnosed with supraglottis cancer.  He has seen [facility] nurse practitioner 3 times after that.  As of spring 2020 when he is finished radiation.  His voice continues to be hoarse and does not return back to baseline.  Apparently voice rehabilitation is an option for them.  During all this he is now top taking his Spiriva and Breo.  His wife does  report dyspnea on exertion that may be slightly worse than before.  However is not any inhalers.  Also because of all this he stopped doing exercise program.  He does have arthritis and is somewhat of an antalgic gait but is able to do bike riding on a stationary bike if he can have access to 1.  In addition he is able to do all his ADLs.  He has had his Covid vaccine but currently meets indication for booster.  They have been counseled about it.   Results for RITA, PROM (MRN [id number]) as of 11/20/2017 11:02  Ref. Range 11/20/2017 09:12  FEV1-Post Latest Units: L 1.43  FEV1-%Pred-Post Latest Units: % 54  FEV1-%Change-Post Latest Units: % 5  Post FEV1/FVC ratio Latest Units: % 61   FEV1FVC-%Change-Post Latest Units: % 0  Results for MOHAB, ASHBY (MRN [id number]) as of 11/20/2017 11:02  Ref. Range 11/20/2017 09:12  DLCO unc Latest Units: ml/min/mmHg 12.99  DLCO unc % pred Latest Units: % 41    Simple office walk 185 feet x  3 laps goal with forehead probe 10/25/2017  11/20/2017  04/23/2019  O2 used Room air Room air Room air  Number laps completed 2 and desaturated 2 and desaturated 1 and deaurates  Comments about pace Normal with limp Normal pace normla pace  Resting Pulse Ox/HR 96% and 63/min 95% and 61/min 97% and 75/min  Final Pulse Ox/HR 84% and 81/min 87% and 85 at 2nd lap 88% and and 89/min  Desaturated </= 88% yes yes   Desaturated <= 3% points yes yes   Got Tachycardic >/= 90/min no no   Symptoms at end of test No complaints No complaints dyspnea  Miscellaneous comments Corrected with 2L O2 correcte with 2nd lap mild      IMPRESSION: CT chest nov 2020 1. Stable tiny left upper lobe pulmonary nodules. Interval 1 year stability consistent with benign etiology. No new or progressive findings on today's study. 2. Stable bilateral thyroid nodules. 3. Aortic Atherosclerosis (ICD10-I70.0) and Emphysema (ICD10-J43.9).     Electronically Signed   By: Misty Stanley M.D.   On: 04/16/2019 13:06     No flowsheet data found.     ROS - per HPI   OV 06/18/2021  Subjective:  Patient ID: Julian Brown, male , DOB: 1933-05-21 , age 88 y.o. , MRN: [id number] , ADDRESS: [place] [phone] PCP Prince Solian, MD Patient Care Team: Prince Solian, MD as PCP - General (Internal Medicine) Francina Ames, MD as Referring Physician (Otolaryngology) Eppie Gibson, MD as Attending Physician (Radiation Oncology) Leota Sauers, RN (Inactive) as Oncology Nurse Navigator Karie Mainland, RD as Dietitian (Nutrition) Valentino Saxon [place], [place] as Speech Language Pathologist (Speech Pathology) Wynelle Beckmann, Melodie Bouillon, PT as  Physical Therapist (Physical Therapy) Amedeo Kinsman, LCSW as Social Worker Malmfelt, Stephani Police, RN as Oncology Nurse Navigator (Oncology)  This Provider for this visit: Treatment Team:  Attending Provider: Brand Males, MD    06/18/2021 -   Chief Complaint  Patient presents with   Follow-up    CT recently performed. Pt states he has been doing okay since last visit and denies any complaints.   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 -presents with his wife.  Have not seen him in 2 years.  He  is doing well.  For his COPD is not taking any inhalers.  I did express to him that he would benefit from some inhalers.  He does have some mild shortness of breath but more moderate when he climbs stairs no cough or wheezing.  He is agreed to take Spiriva.  He did have a CT scan of the chest that are normal lung nodules there is no cancer there is no ILD.  He and his wife stated that they did have COVID in the spring 2021 he has not had his by Valent mRNA booster.  I did advise that it would be beneficial.  They are willing to do that.  He has had his flu shot.   No flowsheet data found.     CT Chest data - Dec 2022  IMPRESSION: 1. No suspicious pulmonary nodules. 2. Aortic atherosclerosis (ICD10-I70.0). Coronary artery calcification. 3. Enlarged pulmonic trunk, indicative of pulmonary arterial hypertension. 4.  Emphysema (ICD10-J43.9).     Electronically Signed   By: Lorin Picket M.D.   On: 06/02/2021 11:06  No results found.     OV 05/31/2022  Subjective:  Patient ID: Julian Brown, male , DOB: 1932/09/08 , age 44 y.o. , MRN: [id number] , ADDRESS: [place] [phone] PCP Prince Solian, MD Patient Care Team: Prince Solian, MD as  PCP - General (Internal Medicine) Francina Ames, MD as Referring Physician (Otolaryngology) Eppie Gibson, MD as Attending Physician (Radiation Oncology) Karie Mainland, RD as Dietitian (Nutrition) Valentino Saxon [place], [place] as Speech Language Pathologist (Speech Pathology) Wynelle Beckmann, Melodie Bouillon, PT as Physical Therapist (Physical Therapy) Malmfelt, Stephani Police, RN as Oncology Nurse Navigator (Oncology) Gardenia Phlegm, NP as Nurse Practitioner (Hematology and Oncology)  This Provider for this visit: Treatment Team:  Attending Provider: Brand Males, MD    05/31/2022 -   Chief Complaint  Patient presents with   Follow-up    Pt states he has been doing okay since last visit and denies any complaints.   Gold stage III COPD Hoarseness of voice status post radiation to throat cancer 2021  HPI Julian Brown 89 y.o. -returns for follow-up.  Presents with his wife.  Meeting daughter Jenny Reichmann for the first time.  Overall stable.  Last visit to he was not doing his maintenance inhalers.  This time to he told me that he is not doing his maintenance inhalers.  Overall he feels stable.  Wife states that he is stable.  He does have some congestion because of his throat surgery.  We discussed at least taking Spiriva and they are willing to give it a try.  After the last visit in October 2023 the daughter called saying that the DME company wanted to change his oxygen system because it been on it for 5 years.  However they have not heard back from [employer] as yet.  The daughter showed me the letter.  The letter is actually for recertification of ON all.  I explained to them this and they are willing to have an Joselyn Arrow on room air test done to recertify need for oxygen at night.  Overall otherwise stable.     CAT COPD Symptom & Quality of Life Score (GSK trademark) 0 is no burden. 5 is highest burden 04/23/2019   Never Cough -> Cough all the time 3  No phlegm in chest -> Chest is  full of phlegm 2  No chest tightness -> Chest feels very tight 1  No dyspnea  for 1 flight stairs/hill -> Very dyspneic for 1 flight of stairs 4  No limitations for ADL at home -> Very limited with ADL at home 0  Confident leaving home -> Not at all confident leaving home 0  Sleep soundly -> Do not sleep soundly because of lung condition 0  Lots of Energy -> No energy at all 3  TOTAL Score (max 40)  13     Simple office walk 185 feet x  3 laps goal with forehead probe 10/25/2017  11/20/2017  04/23/2019 06/18/2021   O2 used Room air Room air Room air ra  Number laps completed 2 and desaturated 2 and desaturated 1 and deaurates 1 lap only  Comments about pace Normal with limp Normal pace normla pace Slow pace  Resting Pulse Ox/HR 96% and 63/min 95% and 61/min 97% and 75/min 99% and 63  Final Pulse Ox/HR 84% and 81/min 87% and 85 at 2nd lap 88% and and 89/min 93% and 83  Desaturated </= 88% yes yes    Desaturated <= 3% points yes yes    Got Tachycardic >/= 90/min no no    Symptoms at end of test No complaints No complaints dyspnea Mil dyspnea  Miscellaneous comments Corrected with 2L O2 correcte with 2nd lap mild Slow pace due to knee pain      PFT     Latest Ref Rng & Units 11/20/2017    9:12 AM  PFT Results  FVC-Pre L 2.23   FVC-Predicted Pre % 59   FVC-Post L 2.36   FVC-Predicted Post % 62   Pre FEV1/FVC % % 61   Post FEV1/FCV % % 61   FEV1-Pre L 1.35   FEV1-Predicted Pre % 51   FEV1-Post L 1.43   DLCO uncorrected ml/min/mmHg 12.99   DLCO UNC% % 41   DLVA Predicted % 70   TLC L 5.79   TLC % Predicted % 84   RV % Predicted % 125        has a past medical history of Acute gangrenous cholecystitis s/p lap cholecystectomy 06/08/2017 (06/08/2017), Arthritis, Colon cancer ([place]), ED (erectile dysfunction), Emphysema of lung ([place]), Hyperlipemia, Hypertension, Prostate cancer ([place]), and Wears glasses.   reports that he quit smoking about 35 years ago. His smoking use included  cigarettes. He has a 36.00 pack-year smoking history. He has never used smokeless tobacco.  Past Surgical History:  Procedure Laterality Date   APPENDECTOMY     CARPAL TUNNEL RELEASE  5/12   lt   CARPAL TUNNEL RELEASE  12/13/2011   Procedure: CARPAL TUNNEL RELEASE;  Surgeon: [name]., MD;  Location: [place];  Service: Orthopedics;  Laterality: Right;  and inject right wrist   CHOLECYSTECTOMY N/A 06/08/2017   Procedure: LAPAROSCOPIC CHOLECYSTECTOMY WITH LYSIS OF ADHESIONS;  Surgeon: [name], MD;  Location: [facility];  Service: General;  Laterality: N/A;   [place]   hemicolectomy-rt-ca   COLONOSCOPY     about 12 inches of colon removed due to colon cancer   IR GASTROSTOMY TUBE MOD SED  11/06/2019   IR GASTROSTOMY TUBE REMOVAL  01/22/2020    Allergies  Allergen Reactions   Fenofibrate     Other reaction(s): muscle aches   Other Other (See Comments)   Amoxicillin-Pot Clavulanate Rash   Penicillins Swelling    Has patient had a PCN reaction causing immediate rash, facial/tongue/throat swelling, SOB or lightheadedness with hypotension: no (lip swelling "as soon as the pill  touched my mouth so I didn't swallow it") Has patient had a PCN reaction causing severe rash involving mucus membranes or skin necrosis: no Has patient had a PCN reaction that required hospitalization: no Has patient had a PCN reaction occurring within the last 10 years: no If all of the above answers are "NO", then may proceed with Ceph    Immunization History  Administered Date(s) Administered   Fluad Quad(high Dose 65+) 05/14/2019   Influenza, High Dose Seasonal PF 02/11/2017, 04/18/2018, 05/02/2022   Influenza, Quadrivalent, Recombinant, Inj, Pf 03/13/2020, 03/16/2021   PFIZER(Purple Top)SARS-COV-2 Vaccination 08/20/2019, 09/10/2019, 03/27/2020   Pneumococcal Conjugate-13 12/05/2013, 11/06/2014   Pneumococcal Polysaccharide-23 10/09/2020   Tdap 07/06/2009   Zoster,  Live 12/01/2009    No family history on file.   Current Outpatient Medications:    amLODipine (NORVASC) 5 MG tablet, 1 tablet Orally Once a day for blood pressure for 90 days, Disp: , Rfl:       Objective:   Vitals:   05/31/22 1422  BP: 132/74  Pulse: (!) 58  Temp: 97.6 F (36.4 C)  TempSrc: Oral  SpO2: 96%  Weight: 173 lb 3.2 oz (78.6 kg)  Height: '5\' 11"'$  (1.803 m)    Estimated body mass index is 24.16 kg/m as calculated from the following:   Height as of this encounter: '5\' 11"'$  (1.803 m).   Weight as of this encounter: 173 lb 3.2 oz (78.6 kg).  '@WEIGHTCHANGE'$ @  Filed Weights   05/31/22 1422  Weight: 173 lb 3.2 oz (78.6 kg)     Physical Exam General: No distress. Looks well Neuro: Alert and Oriented x 3. GCS 15. Speech normal Psych: Pleasant Resp:  Barrel Chest - no.  Wheeze - no, Crackles - no, No overt respiratory distress CVS: Normal heart sounds. Murmurs - no Ext: Stigmata of Connective Tissue Disease - no HEENT: Normal upper airway. PEERL +. No post nasal drip. HOARSE VOICE        Assessment:       ICD-10-CM   1. Stage 2 moderate COPD by GOLD classification ([place])  J44.9     2. Nocturnal hypoxemia  G47.34     3. Hoarseness of voice  R49.0          Plan:     Patient Instructions  Stage 2 moderate COPD by GOLD classification ([place]) Emphysema Nocturnal hypoxemia  - stable   You are not on your inhalers spiriva and  breo scheduled but I do think it can help you - appears you are needing to recertify your night o2 need  Plan -  recheck ONO on room air at night  -do albuterol as needed  - recommend atleast taking spiriva respimat 2 puff scheduled once daily  - CMA to do script - [facility] BRE[facility]   Multiple lung nodules on CT - stable jan 2019 to Nov 2019 with new 53mLUL nodule -> stable Nov 2020 Hx of supraglottis cancer  -No lung nodules and CT scan of the chest December 2022  Plan  -Expectant follow-up   Hoarse voice -  throat cancer s/p XRT Spring 2021  Plan  - per ENT  followup 6 months or sooner; COPD CAT score at follow-up returns for follow-up    SIGNATURE    Dr. MBrand Males M.D., F.C.C.P,  Pulmonary and Critical Care Medicine Staff Physician, CMannfordDirector - Interstitial Lung Disease  Program  Pulmonary [place] [id number]  Pager: [phone], If no answer or between  15:00h - 7:00h: call [phone] Telephone: [phone]  2:57 PM 05/31/2022

## 2022-06-24 ENCOUNTER — Telehealth: Payer: Self-pay | Admitting: Internal Medicine

## 2022-06-24 DIAGNOSIS — G4734 Idiopathic sleep related nonobstructive alveolar hypoventilation: Secondary | ICD-10-CM

## 2022-06-24 NOTE — Telephone Encounter (Signed)
Julian Brown= had overnight pulse oximetry test done on 06/21/2022: Shows desaturations less than or equal to 88% for 3 hours and 27 minutes  Plan - Start 2 L nasal cannula

## 2022-06-27 NOTE — Telephone Encounter (Signed)
Attempted to call pt but unable to reach. Left message to return call.

## 2022-06-28 NOTE — Telephone Encounter (Signed)
Julian Brown daughter would like results of ONO. Cindy phone number is [phone].

## 2022-06-28 NOTE — Telephone Encounter (Signed)
Called and spoke to [place]. Got the verbal from [place] that it was ok to speak to daughter. Went over [employer] with her. She verbalized understanding. Nothing further needed

## 2022-07-06 ENCOUNTER — Encounter: Payer: Self-pay | Admitting: Internal Medicine

## 2022-07-29 ENCOUNTER — Telehealth: Payer: Self-pay | Admitting: Adult Health

## 2022-07-29 NOTE — Telephone Encounter (Signed)
Per 2/16 IB reached out to daughter to reschedule appointment, daughter aware and confirmed time and date of appointment.

## 2022-09-29 ENCOUNTER — Other Ambulatory Visit: Payer: Self-pay

## 2022-09-29 ENCOUNTER — Ambulatory Visit: Payer: Medicare Other | Attending: Otolaryngology

## 2022-09-29 DIAGNOSIS — R1312 Dysphagia, oropharyngeal phase: Secondary | ICD-10-CM | POA: Diagnosis not present

## 2022-09-29 DIAGNOSIS — R490 Dysphonia: Secondary | ICD-10-CM

## 2022-09-29 NOTE — Therapy (Signed)
[facility] OUTPATIENT SPEECH LANGUAGE PATHOLOGY SWALLOW EVALUATION   Patient Name: Julian Brown MRN: [id number] DOB:02-04-1933, 89 y.o., male Today's Date: 09/29/2022  PCP: [name], MD REFERRING PROVIDER: Corey Skains, MD  END OF SESSION:  End of Session - 09/29/22 2315     Visit Number 1    Number of Visits 17    Date for SLP Re-Evaluation 12/08/22    SLP Start Time 1542   10 minutes late   SLP Stop Time  1627    SLP Time Calculation (min) 45 min    Activity Tolerance Patient tolerated treatment well             Past Medical History:  Diagnosis Date   Acute gangrenous cholecystitis s/p lap cholecystectomy 06/08/2017 06/08/2017   Arthritis    Colon cancer    ED (erectile dysfunction)    Emphysema of lung    Hyperlipemia    Hypertension    Prostate cancer    prostate cancer-tx radiation   Wears glasses    Past Surgical History:  Procedure Laterality Date   APPENDECTOMY     CARPAL TUNNEL RELEASE  5/12   lt   CARPAL TUNNEL RELEASE  12/13/2011   Procedure: CARPAL TUNNEL RELEASE;  Surgeon: Wyn Forster., MD;  Location: [facility];  Service: Orthopedics;  Laterality: Right;  and inject right wrist   CHOLECYSTECTOMY N/A 06/08/2017   Procedure: LAPAROSCOPIC CHOLECYSTECTOMY WITH LYSIS OF ADHESIONS;  Surgeon: Ovidio Kin, MD;  Location: [facility];  Service: General;  Laterality: N/A;   COLON SURGERY  1997   hemicolectomy-rt-ca   COLONOSCOPY     about 12 inches of colon removed due to colon cancer   IR GASTROSTOMY TUBE MOD SED  11/06/2019   IR GASTROSTOMY TUBE REMOVAL  01/22/2020   Patient Active Problem List   Diagnosis Date Noted   Malignant neoplasm of supraglottis 08/08/2019   Hoarseness of voice 06/12/2019   Cough 05/23/2019   Primary osteoarthritis of right wrist 07/31/2018   Stage 2 moderate COPD by GOLD classification 05/16/2018   Allergic rhinitis 05/16/2018   Nocturnal hypoxemia 05/16/2018   Abnormal finding on lung imaging  05/16/2018   Hypertension    Hyperlipemia    Acute gangrenous cholecystitis s/p lap cholecystectomy 06/08/2017 06/08/2017   Pain in right wrist 01/04/2017   Chronic pain of left knee 01/04/2017   Chronic pain of right knee 01/04/2017   Unilateral primary osteoarthritis, left knee 01/04/2017   Unilateral primary osteoarthritis, right knee 01/04/2017    ONSET DATE: <6 months ago; script dated 09/20/22  REFERRING DIAG: Dysphagia, pharyngoesophageal stage  THERAPY DIAG:  Dysphagia, oropharyngeal phase  Hoarseness  Rationale for Evaluation and Treatment: Rehabilitation  SUBJECTIVE:   SUBJECTIVE STATEMENT: "Can you give me anything to get this out of my throat?" (X3 during session) Pt accompanied by: significant other and family member  PERTINENT HISTORY: Pt with hx of laryngeal cancer treated with rad in 2021. Pt performed HEP average <7 days/week once per day (suboptimal completion).  PAIN:  Are you having pain? No  FALLS: Has patient fallen in last 6 months?  No  LIVING ENVIRONMENT: Lives with: lives with their spouse with family visiting regularly Lives in: House/apartment  PLOF:  Level of assistance: Independent with ADLs, Independent with IADLs Employment: Retired  PATIENT GOALS: Improve swallowing  OBJECTIVE:   DIAGNOSTIC FINDINGS: Esophagram had to be ended due to pt aspiration of large sip of barium.  RECOMMENDATIONS FROM OBJECTIVE SWALLOW STUDY (MBSS/FEES):  From  [facility]: "There has been a decline in patients swallowing function compared to his prior swallow evaluation in September. Patient presents with moderate oropharyngeal dysphagia most notably pharyngeal residue has increased as well as increased incidences of laryngeal penetration. Aspiration precautions discussed and encouraged. Reassurance provided that there is no residual pill or otherwise observed in the throat. We are unable to assess and to the esophagus and this could certainly  correlate." "Dysphagia may be related to progressive changes associated with prior head and neck cancer treatment and slash or generalized weakness given concern for weight loss today. Patient is willing to pursue outpatient swallow therapy if this can be coordinated closer to home. He is also agreeable to Rd. guidance given weight loss. Further esophageal assessment could be beneficial to rule out impact as well." Objective swallow impairments: Prolonged oral prep phase with solids delayed swallow initiation with liquids. Decreased base of tongue retraction, decreased pharyngeal constriction, incomplete laryngeal vestibule closure.  Objective recommended compensations: Small bites and sips, intermittently clear throat and cough, alternate bites and sips, dry swallow once or twice for each bite or sip  COGNITION: Overall cognitive status: Impaired Areas of impairment:  Memory: Impaired: Short term Functional deficits: Pt had difficulty with   ORAL MOTOR EXAMINATION: Overall status: WFL Comments: SLP palpated pt's parathyroid area, under jaw, in submental area, and could not appreciate widespread fibrosis. Tissue bil to hyoid bone appeared slightly firm.  CLINICAL SWALLOW ASSESSMENT:   Current diet: regular and thin liquids Dentition: missing dentition but adequate Patient directly observed with POs: Yes: thin liquids  Feeding: able to feed self Liquids provided by: cup Oral phase signs and symptoms: oral holding Pharyngeal phase signs and symptoms: delayed throat clear When pt was completing HEP much lingual rocking was noted; SLP believes pt's swallow is currently deconditioned  PATIENT REPORTED OUTCOME MEASURES (PROM): EAT-10: to be completed first session   TODAY'S TREATMENT:                                                                                                                                         DATE:  09/29/22 (eval): SLP trained pt and wife as well as daughter Arline Asp  in HEP provided by [facility]. Trained pt and wife and daughter re: safe swallow precautions.   PATIENT EDUCATION: Education details: HEP procedure, safe swallow precautions, rationale for HEP Person educated: Patient, Spouse, and Child(ren) Education method: Explanation, Demonstration, Verbal cues, and Handouts Education comprehension: verbalized understanding, returned demonstration, verbal cues required, and needs further education   ASSESSMENT:  CLINICAL IMPRESSION: Patient is a 89 y.o. male who was seen today for initiation of ST following FEES at [facility] on 09/20/22. See above for more details about this evaluation. Pt is known to this SLP from previous therapy course; pt had not been performing HEP since the previous course of therapy ended in September 2021. Pt also with mod hoarseness. Pt completed  HEP in 2021 at suboptimal frequency; SLP explained to pt rationale for at least completing 20-30 reps of HEP each day.  OBJECTIVE IMPAIRMENTS: include memory, voice disorder, and dysphagia. These impairments are limiting patient from safety when swallowing. Factors affecting potential to achieve goals and functional outcome are ability to learn/carryover information and cooperation/participation level. Patient will benefit from skilled SLP services to address above impairments and improve overall function.  REHAB POTENTIAL: Fair given severity, previous questionable participation in tx exercisds   GOALS: Goals reviewed with patient? No  SHORT TERM GOALS: Target date: 11/01/22  Pt will tell SLP 3 overt s/sx aspiration PNA with modified independence in 2 sessions Baseline: Goal status: INITIAL  2.  Pt will complete HEP for dysphagia with occasional min Ain 2 sessions Baseline:  Goal status: INITIAL  3.  Pt will follow safe swallow precautions from FEES on 09/20/22 in 3 sessions, with written cues and rare min A Baseline:  Goal status: INITIAL   LONG TERM GOALS: Target date:  12/08/22  Pt will improve EAT-10 score compared to initial administration Baseline:  Goal status: INITIAL  2.  Pt will complete HEP for dysphagia with modified independence in 3 sessions Baseline:  Goal status: INITIAL  3.  Pt will follow safe swallow precautions from FEES on 09/20/22 in 3 sessions, with modified independence Baseline:  Goal status: INITIAL   PLAN:  SLP FREQUENCY: 1-2x/week  SLP DURATION: 8 weeks  PLANNED INTERVENTIONS: Aspiration precaution training, Pharyngeal strengthening exercises, Diet toleration management , Trials of upgraded texture/liquids, Internal/external aids, SLP instruction and feedback, Compensatory strategies, and Patient/family education    [facility], CCC-SLP 09/29/2022, 11:16 PM

## 2022-09-29 NOTE — Patient Instructions (Addendum)
SWALLOWING EXERCISES Do these until June 20th, then 2 times per week afterwards  Tongue press/Effortful Swallows - Press your entire tongue up to the roof of your mouth for three seconds - Squeeze the muscles in your neck hard while you swallow your saliva or a sip of water - Repeat 10-15 times, 2 times a day  Masako Swallow - swallow with your tongue sticking out - Stick out your tongue past your lips and hold it there with your teeth - Swallow, while holding your tongue in your teeth - Repeat 10-15 times, 2 times a day *use a wet spoon if your mouth gets dry*  3. Mendelsohn Maneuver - "half swallow" exercise - Start to swallow, and keep your Adam's apple up by squeezing hard with the muscles of the throat - Hold the squeeze for 5-7 seconds and then relax - Repeat 10-15 times, 2-3 times a day *use a wet spoon if your mouth gets dry*    WHEN YOU EAT: Small bites and sips Put a sip between EVERY bite Dry swallow once or twice for each bite/sip Cough or clear throat intermittently during meals  Stick with "soft foods, with careful bites"  Crush medications that you can

## 2022-09-30 NOTE — Therapy (Signed)
[facility] OUTPATIENT SPEECH LANGUAGE PATHOLOGY TREATMENT NOTE   Patient Name: Julian Brown MRN: [id number] DOB:11-07-1932, 89 y.o., male Today's Date: 10/03/2022  PCP: [name], MD REFERRING PROVIDER: Corey Skains, MD  END OF SESSION:  End of Session - 10/03/22 1102     Visit Number 2    Number of Visits 17    Date for SLP Re-Evaluation 12/08/22    SLP Start Time 1102    SLP Stop Time  1145    SLP Time Calculation (min) 43 min    Activity Tolerance Patient tolerated treatment well              Past Medical History:  Diagnosis Date   Acute gangrenous cholecystitis s/p lap cholecystectomy 06/08/2017 06/08/2017   Arthritis    Colon cancer    ED (erectile dysfunction)    Emphysema of lung    Hyperlipemia    Hypertension    Prostate cancer    prostate cancer-tx radiation   Wears glasses    Past Surgical History:  Procedure Laterality Date   APPENDECTOMY     CARPAL TUNNEL RELEASE  5/12   lt   CARPAL TUNNEL RELEASE  12/13/2011   Procedure: CARPAL TUNNEL RELEASE;  Surgeon: Wyn Forster., MD;  Location: [facility];  Service: Orthopedics;  Laterality: Right;  and inject right wrist   CHOLECYSTECTOMY N/A 06/08/2017   Procedure: LAPAROSCOPIC CHOLECYSTECTOMY WITH LYSIS OF ADHESIONS;  Surgeon: Ovidio Kin, MD;  Location: [facility];  Service: General;  Laterality: N/A;   COLON SURGERY  1997   hemicolectomy-rt-ca   COLONOSCOPY     about 12 inches of colon removed due to colon cancer   IR GASTROSTOMY TUBE MOD SED  11/06/2019   IR GASTROSTOMY TUBE REMOVAL  01/22/2020   Patient Active Problem List   Diagnosis Date Noted   Malignant neoplasm of supraglottis 08/08/2019   Hoarseness of voice 06/12/2019   Cough 05/23/2019   Primary osteoarthritis of right wrist 07/31/2018   Stage 2 moderate COPD by GOLD classification 05/16/2018   Allergic rhinitis 05/16/2018   Nocturnal hypoxemia 05/16/2018   Abnormal finding on lung imaging 05/16/2018    Hypertension    Hyperlipemia    Acute gangrenous cholecystitis s/p lap cholecystectomy 06/08/2017 06/08/2017   Pain in right wrist 01/04/2017   Chronic pain of left knee 01/04/2017   Chronic pain of right knee 01/04/2017   Unilateral primary osteoarthritis, left knee 01/04/2017   Unilateral primary osteoarthritis, right knee 01/04/2017    ONSET DATE: <6 months ago; script dated 09/20/22  REFERRING DIAG: Dysphagia, pharyngoesophageal stage  THERAPY DIAG:  Dysphagia, oropharyngeal phase  Hoarseness  Rationale for Evaluation and Treatment: Rehabilitation  SUBJECTIVE:   SUBJECTIVE STATEMENT: "I do what they say" re: HEP completion. Daughter reports she and sister are encouraging completion of prescribed exercises 2x/day   PERTINENT HISTORY: Pt with hx of laryngeal cancer treated with rad in 2021. Pt performed HEP average <7 days/week once per day (suboptimal completion).  PAIN:  Are you having pain? No  PATIENT GOALS: Improve swallowing  OBJECTIVE:   DIAGNOSTIC FINDINGS: Esophagram had to be ended due to pt aspiration of large sip of barium.  RECOMMENDATIONS FROM OBJECTIVE SWALLOW STUDY (MBSS/FEES):  From [facility]: "There has been a decline in patients swallowing function compared to his prior swallow evaluation in September. Patient presents with moderate oropharyngeal dysphagia most notably pharyngeal residue has increased as well as increased incidences of laryngeal penetration. Aspiration precautions discussed and encouraged. Reassurance provided  that there is no residual pill or otherwise observed in the throat. We are unable to assess and to the esophagus and this could certainly correlate." "Dysphagia may be related to progressive changes associated with prior head and neck cancer treatment and slash or generalized weakness given concern for weight loss today. Patient is willing to pursue outpatient swallow therapy if this can be coordinated closer to home. He is also  agreeable to Rd. guidance given weight loss. Further esophageal assessment could be beneficial to rule out impact as well." Objective swallow impairments: Prolonged oral prep phase with solids delayed swallow initiation with liquids. Decreased base of tongue retraction, decreased pharyngeal constriction, incomplete laryngeal vestibule closure.  Objective recommended compensations: Small bites and sips, intermittently clear throat and cough, alternate bites and sips, dry swallow once or twice for each bite or sip COGNITION: Overall cognitive status: Impaired Areas of impairment:  Memory: Impaired: Short term Functional deficits: Pt had difficulty with    ORAL MOTOR EXAMINATION: Overall status: WFL Comments: SLP palpated pt's parathyroid area, under jaw, in submental area, and could not appreciate widespread fibrosis. Tissue bil to hyoid bone appeared slightly firm.   CLINICAL SWALLOW ASSESSMENT:   Current diet: regular and thin liquids Dentition: missing dentition but adequate Patient directly observed with POs: Yes: thin liquids  Feeding: able to feed self Liquids provided by: cup Oral phase signs and symptoms: oral holding Pharyngeal phase signs and symptoms: delayed throat clear When pt was completing HEP much lingual rocking was noted; SLP believes pt's swallow is currently deconditioned  PATIENT REPORTED OUTCOME MEASURES (PROM): EAT-10: 12 "2": weight, going out to eat, food sticking in throat "3": coughing when eating, solids take extra effort   Pt scores stress related to eating and pleasure affected as "0," family disagrees, saying pt is avoiding preferred foods  Pt scores "0" for swallowing pills- note that pt w/o challenges related to current pill routine but anything larger which could not be crushed would likely cause a problem.   TODAY'S TREATMENT:                                                                                                                                          DATE:  10/03/22: SLP initiated education regarding aspiration signs/symptoms and precautions. Pt's daughter reports intermittent throat clears and use of liquid washes have been implemented during meals (with caregiver cues) to reduce risk of aspiration. Recommend BID oral care, maintain active and healthy lifestyle, manage GERD as additional precautions.   Pt able to teach back 2/3 swallow exercises which are part of HEP. SLP provided clarification regarding tongue press + effortful swallow as pt was completing separately. Pt able to complete as single exercise with visual cues for timing of tongue press. Given model and verbal cues, pt completing x10 tongue press + hard swallow, x10 mendelsohn maneuver, x10 masako swallows. Pt benefiting from cues for  intermittent sips of water to facilitate successful exercise completion in succession, as increased challenge was noted 2/2 dry mouth. May be able to consider increasing repetitions if pt demonstrates ability to complete current regimen with ease.   09/29/22 (eval): SLP trained pt and wife as well as daughter Arline Asp in HEP provided by [facility]. Trained pt and wife and daughter re: safe swallow precautions.   PATIENT EDUCATION: Education details: HEP procedure, safe swallow precautions, rationale for HEP Person educated: Patient, Spouse, and Child(ren) Education method: Explanation, Demonstration, Verbal cues, and Handouts Education comprehension: verbalized understanding, returned demonstration, verbal cues required, and needs further education   ASSESSMENT:  CLINICAL IMPRESSION: Patient is a 89 y.o. male who was seen today for initiation of ST following FEES at [facility] on 09/20/22. See above for more details about this evaluation. Pt is known to this SLP from previous therapy course; pt had not been performing HEP since the previous course of therapy ended in September 2021. Pt also with mod hoarseness. Pt completed HEP in 2021 at  suboptimal frequency; SLP explained to pt rationale for at least completing 20-30 reps of HEP each day.  OBJECTIVE IMPAIRMENTS: include memory, voice disorder, and dysphagia. These impairments are limiting patient from safety when swallowing. Factors affecting potential to achieve goals and functional outcome are ability to learn/carryover information and cooperation/participation level. Patient will benefit from skilled SLP services to address above impairments and improve overall function.  REHAB POTENTIAL: Fair given severity, previous questionable participation in tx exercisds   GOALS: Goals reviewed with patient? No  SHORT TERM GOALS: Target date: 11/01/22  Pt will tell SLP 3 overt s/sx aspiration PNA with modified independence in 2 sessions Baseline: Goal status: INITIAL  2.  Pt will complete HEP for dysphagia with occasional min Ain 2 sessions Baseline:  Goal status: INITIAL  3.  Pt will follow safe swallow precautions from FEES on 09/20/22 in 3 sessions, with written cues and rare min A Baseline:  Goal status: INITIAL   LONG TERM GOALS: Target date: 12/08/22  Pt will improve EAT-10 score compared to initial administration Baseline:  Goal status: INITIAL  2.  Pt will complete HEP for dysphagia with modified independence in 3 sessions Baseline:  Goal status: INITIAL  3.  Pt will follow safe swallow precautions from FEES on 09/20/22 in 3 sessions, with modified independence Baseline:  Goal status: INITIAL   PLAN:  SLP FREQUENCY: 1-2x/week  SLP DURATION: 8 weeks  PLANNED INTERVENTIONS: Aspiration precaution training, Pharyngeal strengthening exercises, Diet toleration management , Trials of upgraded texture/liquids, Internal/external aids, SLP instruction and feedback, Compensatory strategies, and Patient/family education    Maia Breslow, CCC-SLP 10/03/2022, 11:48 AM

## 2022-10-03 ENCOUNTER — Ambulatory Visit: Payer: Medicare Other | Admitting: Speech Pathology

## 2022-10-03 DIAGNOSIS — R1312 Dysphagia, oropharyngeal phase: Secondary | ICD-10-CM | POA: Diagnosis not present

## 2022-10-03 DIAGNOSIS — R490 Dysphonia: Secondary | ICD-10-CM

## 2022-10-03 NOTE — Patient Instructions (Signed)
  What is aspiration?  With a normal swallow mechanism food and liquid travel from the mouth to the esophagus- a long narrow tube that propels the food into the stomach for digestion.  The passage of food or liquid into the lungs is known as aspiration.  Aspiration can be overt; meaning coughing or throat clearing is associated with the aspiration event. signs of aspiration coughing, throat clearing sensation of food or liquid sticking in throat voice changes shortness of breath  Aspiration can also be "silent;" meaning that food or liquid is "going down the wrong pipe" without [place] knowing.  No cough or throat clearing is elicited.   What is aspiration pneumonia? The passage of food and liquid into the lungs results in an increase of bacteria within the lungs causing pneumonia. Pneumonia is often treated with antibiotics, but can be very serious and result in hospitalization.   What are the signs of pneumonia?  Fever, sweating and shaking chills Cough, which may produce phlegm Chest pain when you breathe or cough Shortness of breath Fatigue Nausea, vomiting or diarrhea  How do I reduce my risk for aspiration pneumonia? Practice good oral hygiene. Regular brushing can help decrease bacteria in the mouth that can contribute to pneumonia if aspirated.  Follow the diet recommendations of your speech therapist and physician. Avoid foods that cause aspiration when possible. Perform recommend exercises or swallowing maneuvers.  Don't smoke. Smoking damages your lungs' natural defenses against respiratory infections. Keep your immune system strong. Get enough sleep, exercise regularly and eat a healthy diet. Maintain a healthy and active lifestyle.  Control of other health conditions such as COPD and GERD (reflux).

## 2022-10-05 ENCOUNTER — Ambulatory Visit: Payer: Medicare Other

## 2022-10-05 DIAGNOSIS — R1312 Dysphagia, oropharyngeal phase: Secondary | ICD-10-CM | POA: Diagnosis not present

## 2022-10-05 DIAGNOSIS — R490 Dysphonia: Secondary | ICD-10-CM

## 2022-10-05 NOTE — Therapy (Signed)
[facility] OUTPATIENT SPEECH LANGUAGE PATHOLOGY TREATMENT NOTE   Patient Name: Julian Brown MRN: [id number] DOB:June 11, 1933, 89 y.o., male Today's Date: 10/05/2022  PCP: [name], MD REFERRING PROVIDER: Corey Skains, MD  END OF SESSION:  End of Session - 10/05/22 1241     Visit Number 3    Number of Visits 17    Date for SLP Re-Evaluation 12/08/22    SLP Start Time 1233    SLP Stop Time  1315    SLP Time Calculation (min) 42 min    Activity Tolerance Patient tolerated treatment well              Past Medical History:  Diagnosis Date   Acute gangrenous cholecystitis s/p lap cholecystectomy 06/08/2017 06/08/2017   Arthritis    Colon cancer    ED (erectile dysfunction)    Emphysema of lung    Hyperlipemia    Hypertension    Prostate cancer    prostate cancer-tx radiation   Wears glasses    Past Surgical History:  Procedure Laterality Date   APPENDECTOMY     CARPAL TUNNEL RELEASE  5/12   lt   CARPAL TUNNEL RELEASE  12/13/2011   Procedure: CARPAL TUNNEL RELEASE;  Surgeon: Wyn Forster., MD;  Location: [facility];  Service: Orthopedics;  Laterality: Right;  and inject right wrist   CHOLECYSTECTOMY N/A 06/08/2017   Procedure: LAPAROSCOPIC CHOLECYSTECTOMY WITH LYSIS OF ADHESIONS;  Surgeon: Ovidio Kin, MD;  Location: [facility];  Service: General;  Laterality: N/A;   COLON SURGERY  1997   hemicolectomy-rt-ca   COLONOSCOPY     about 12 inches of colon removed due to colon cancer   IR GASTROSTOMY TUBE MOD SED  11/06/2019   IR GASTROSTOMY TUBE REMOVAL  01/22/2020   Patient Active Problem List   Diagnosis Date Noted   Malignant neoplasm of supraglottis 08/08/2019   Hoarseness of voice 06/12/2019   Cough 05/23/2019   Primary osteoarthritis of right wrist 07/31/2018   Stage 2 moderate COPD by GOLD classification 05/16/2018   Allergic rhinitis 05/16/2018   Nocturnal hypoxemia 05/16/2018   Abnormal finding on lung imaging 05/16/2018    Hypertension    Hyperlipemia    Acute gangrenous cholecystitis s/p lap cholecystectomy 06/08/2017 06/08/2017   Pain in right wrist 01/04/2017   Chronic pain of left knee 01/04/2017   Chronic pain of right knee 01/04/2017   Unilateral primary osteoarthritis, left knee 01/04/2017   Unilateral primary osteoarthritis, right knee 01/04/2017    ONSET DATE: <6 months ago; script dated 09/20/22  REFERRING DIAG: Dysphagia, pharyngoesophageal stage  THERAPY DIAG:  Dysphagia, oropharyngeal phase  Hoarseness  Rationale for Evaluation and Treatment: Rehabilitation  SUBJECTIVE:   SUBJECTIVE STATEMENT: Daughter indicated she and her sister are assisting pt with HEP BID.   PERTINENT HISTORY: Pt with hx of laryngeal cancer treated with rad in 2021. Pt performed HEP average <7 days/week once per day (suboptimal completion).  PAIN:  Are you having pain? No  PATIENT GOALS: Improve swallowing  OBJECTIVE:   DIAGNOSTIC FINDINGS: Esophagram had to be ended due to pt aspiration of large sip of barium.  RECOMMENDATIONS FROM OBJECTIVE SWALLOW STUDY (MBSS/FEES):  From [facility]: "There has been a decline in patients swallowing function compared to his prior swallow evaluation in September. Patient presents with moderate oropharyngeal dysphagia most notably pharyngeal residue has increased as well as increased incidences of laryngeal penetration. Aspiration precautions discussed and encouraged. Reassurance provided that there is no residual pill or otherwise  observed in the throat. We are unable to assess and to the esophagus and this could certainly correlate." "Dysphagia may be related to progressive changes associated with prior head and neck cancer treatment and slash or generalized weakness given concern for weight loss today. Patient is willing to pursue outpatient swallow therapy if this can be coordinated closer to home. He is also agreeable to Rd. guidance given weight loss. Further esophageal  assessment could be beneficial to rule out impact as well." Objective swallow impairments: Prolonged oral prep phase with solids delayed swallow initiation with liquids. Decreased base of tongue retraction, decreased pharyngeal constriction, incomplete laryngeal vestibule closure.  Objective recommended compensations: Small bites and sips, intermittently clear throat and cough, alternate bites and sips, dry swallow once or twice for each bite or sip COGNITION: Overall cognitive status: Impaired Areas of impairment:  Memory: Impaired: Short term Functional deficits: Pt had difficulty with instructions of HEP, and recall of frequency of overt s/sx pharygneal stage deficits    PATIENT REPORTED OUTCOME MEASURES (PROM): EAT-10: 12 "2": weight, going out to eat, food sticking in throat "3": coughing when eating, solids take extra effort   Pt scores stress related to eating and pleasure affected as "0," family disagrees, saying pt is avoiding preferred foods  Pt scores "0" for swallowing pills- note that pt w/o challenges related to current pill routine but anything larger which could not be crushed would likely cause a problem.   TODAY'S TREATMENT:                                                                                                                                         DATE:  10/05/22: Pt's daughters going over BID and helping pt with exercises. SLP told pt and family today that pt will need to maintain HEP x3 days/week AFTER 12/01/22 in order to maintain any strength obtained from doing HEP daily. Today SLP assisted pt with occasional min cueing with swallow precautions faded to independent. Pt's daughter did HEP with pt today - SLP ensured daughter knew about extent of lingual protrusion for [place]. She req'd palpation of thyroid to cue pt when to hold on [place]. This was done appropriately - dtr doing well with cueing pt. Pt req'd mod cues usually for correct completion.  10/03/22:  SLP initiated education regarding aspiration signs/symptoms and precautions. Pt's daughter reports intermittent throat clears and use of liquid washes have been implemented during meals (with caregiver cues) to reduce risk of aspiration. Recommend BID oral care, maintain active and healthy lifestyle, manage GERD as additional precautions.   Pt able to teach back 2/3 swallow exercises which are part of HEP. SLP provided clarification regarding tongue press + effortful swallow as pt was completing separately. Pt able to complete as single exercise with visual cues for timing of tongue press. Given model and verbal cues, pt completing x10 tongue press + hard swallow, x10  mendelsohn maneuver, x10 masako swallows. Pt benefiting from cues for intermittent sips of water to facilitate successful exercise completion in succession, as increased challenge was noted 2/2 dry mouth. May be able to consider increasing repetitions if pt demonstrates ability to complete current regimen with ease.   09/29/22 (eval): SLP trained pt and wife as well as daughter Arline Asp in HEP provided by [facility]. Trained pt and wife and daughter re: safe swallow precautions.   PATIENT EDUCATION: Education details: HEP procedure, safe swallow precautions, rationale for HEP Person educated: Patient, Spouse, and Child(ren) Education method: Explanation, Demonstration, Verbal cues, and Handouts Education comprehension: verbalized understanding, returned demonstration, verbal cues required, and needs further education   ASSESSMENT:  CLINICAL IMPRESSION: Patient is a 89 y.o. male who was seen today for initiation of ST following FEES at [facility] on 09/20/22. See above for more details about this evaluation. Pt is known to this SLP from previous therapy course; pt had not been performing HEP since the previous course of therapy ended in September 2021. Pt also with mod hoarseness. Pt completed HEP in 2021 at suboptimal frequency; SLP explained  to pt rationale for at least completing 20-30 reps of HEP each day.  OBJECTIVE IMPAIRMENTS: include memory, voice disorder, and dysphagia. These impairments are limiting patient from safety when swallowing. Factors affecting potential to achieve goals and functional outcome are ability to learn/carryover information and cooperation/participation level. Patient will benefit from skilled SLP services to address above impairments and improve overall function.  REHAB POTENTIAL: Fair given severity, previous questionable participation in tx exercisds   GOALS: Goals reviewed with patient? No  SHORT TERM GOALS: Target date: 11/01/22  Pt will tell SLP 3 overt s/sx aspiration PNA with modified independence in 2 sessions Baseline: Goal status: INITIAL  2.  Pt will complete HEP for dysphagia with occasional min A in 2 sessions Baseline:  Goal status: INITIAL  3.  Pt will follow safe swallow precautions from FEES on 09/20/22 in 3 sessions, with written cues and rare min A Baseline:  Goal status: INITIAL   LONG TERM GOALS: Target date: 12/08/22  Pt will improve EAT-10 score compared to initial administration Baseline:  Goal status: INITIAL  2.  Pt will complete HEP for dysphagia with modified independence in 3 sessions Baseline:  Goal status: INITIAL  3.  Pt will follow safe swallow precautions from FEES on 09/20/22 in 3 sessions, with modified independence Baseline:  Goal status: INITIAL   PLAN:  SLP FREQUENCY: 1-2x/week  SLP DURATION: 8 weeks  PLANNED INTERVENTIONS: Aspiration precaution training, Pharyngeal strengthening exercises, Diet toleration management , Trials of upgraded texture/liquids, Internal/external aids, SLP instruction and feedback, Compensatory strategies, and Patient/family education    [facility], CCC-SLP 10/05/2022, 12:42 PM

## 2022-10-10 ENCOUNTER — Ambulatory Visit: Payer: Medicare Other

## 2022-10-10 DIAGNOSIS — R1312 Dysphagia, oropharyngeal phase: Secondary | ICD-10-CM | POA: Diagnosis not present

## 2022-10-10 DIAGNOSIS — R490 Dysphonia: Secondary | ICD-10-CM

## 2022-10-10 NOTE — Therapy (Signed)
[facility] OUTPATIENT SPEECH LANGUAGE PATHOLOGY TREATMENT NOTE   Patient Name: Julian Brown MRN: [id number] DOB:03-30-1933, 89 y.o., male Today's Date: 10/10/2022  PCP: [name], MD REFERRING PROVIDER: Corey Skains, MD  END OF SESSION:  End of Session - 10/10/22 0851     Visit Number 4    Number of Visits 17    Date for SLP Re-Evaluation 12/08/22    SLP Start Time 0850    SLP Stop Time  0930    SLP Time Calculation (min) 40 min    Activity Tolerance Patient tolerated treatment well              Past Medical History:  Diagnosis Date   Acute gangrenous cholecystitis s/p lap cholecystectomy 06/08/2017 06/08/2017   Arthritis    Colon cancer [facility])    ED (erectile dysfunction)    Emphysema of lung (HCC)    Hyperlipemia    Hypertension    Prostate cancer (HCC)    prostate cancer-tx radiation   Wears glasses    Past Surgical History:  Procedure Laterality Date   APPENDECTOMY     CARPAL TUNNEL RELEASE  5/12   lt   CARPAL TUNNEL RELEASE  12/13/2011   Procedure: CARPAL TUNNEL RELEASE;  Surgeon: Wyn Forster., MD;  Location: [facility];  Service: Orthopedics;  Laterality: Right;  and inject right wrist   CHOLECYSTECTOMY N/A 06/08/2017   Procedure: LAPAROSCOPIC CHOLECYSTECTOMY WITH LYSIS OF ADHESIONS;  Surgeon: Ovidio Kin, MD;  Location: [facility];  Service: General;  Laterality: N/A;   COLON SURGERY  1997   hemicolectomy-rt-ca   COLONOSCOPY     about 12 inches of colon removed due to colon cancer   IR GASTROSTOMY TUBE MOD SED  11/06/2019   IR GASTROSTOMY TUBE REMOVAL  01/22/2020   Patient Active Problem List   Diagnosis Date Noted   Malignant neoplasm of supraglottis (HCC) 08/08/2019   Hoarseness of voice 06/12/2019   Cough 05/23/2019   Primary osteoarthritis of right wrist 07/31/2018   Stage 2 moderate COPD by GOLD classification (HCC) 05/16/2018   Allergic rhinitis 05/16/2018   Nocturnal hypoxemia 05/16/2018   Abnormal finding on lung  imaging 05/16/2018   Hypertension    Hyperlipemia    Acute gangrenous cholecystitis s/p lap cholecystectomy 06/08/2017 06/08/2017   Pain in right wrist 01/04/2017   Chronic pain of left knee 01/04/2017   Chronic pain of right knee 01/04/2017   Unilateral primary osteoarthritis, left knee 01/04/2017   Unilateral primary osteoarthritis, right knee 01/04/2017    ONSET DATE: <6 months ago; script dated 09/20/22  REFERRING DIAG: Dysphagia, pharyngoesophageal stage  THERAPY DIAG:  Dysphagia, oropharyngeal phase  Hoarseness  Rationale for Evaluation and Treatment: Rehabilitation  SUBJECTIVE:   SUBJECTIVE STATEMENT: Less coughing heard with thicker liquids than thin liquids, per Misty Stanley (daughter). "I'm not sure if he's doing double swallows and small bites and sips at home."  PERTINENT HISTORY: Pt with hx of laryngeal cancer treated with rad in 2021. Pt performed HEP average <7 days/week once per day (suboptimal completion).  PAIN:  Are you having pain? No  PATIENT GOALS: Improve swallowing  OBJECTIVE:   DIAGNOSTIC FINDINGS: Esophagram had to be ended due to pt aspiration of large sip of barium.  RECOMMENDATIONS FROM OBJECTIVE SWALLOW STUDY (MBSS/FEES):  From [facility]: "There has been a decline in patients swallowing function compared to his prior swallow evaluation in September. Patient presents with moderate oropharyngeal dysphagia most notably pharyngeal residue has increased as well as increased  incidences of laryngeal penetration. Aspiration precautions discussed and encouraged. Reassurance provided that there is no residual pill or otherwise observed in the throat. We are unable to assess and to the esophagus and this could certainly correlate." "Dysphagia may be related to progressive changes associated with prior head and neck cancer treatment and slash or generalized weakness given concern for weight loss today. Patient is willing to pursue outpatient swallow therapy if  this can be coordinated closer to home. He is also agreeable to Rd. guidance given weight loss. Further esophageal assessment could be beneficial to rule out impact as well." Objective swallow impairments: Prolonged oral prep phase with solids delayed swallow initiation with liquids. Decreased base of tongue retraction, decreased pharyngeal constriction, incomplete laryngeal vestibule closure.  Objective recommended compensations: Small bites and sips, intermittently clear throat and cough, alternate bites and sips, dry swallow once or twice for each bite or sip COGNITION: Overall cognitive status: Impaired Areas of impairment:  Memory: Impaired: Short term Functional deficits: Pt had difficulty with instructions of HEP, and recall of frequency of overt s/sx pharygneal stage deficits    PATIENT REPORTED OUTCOME MEASURES (PROM): EAT-10: 12 "2": weight, going out to eat, food sticking in throat "3": coughing when eating, solids take extra effort   Pt scores stress related to eating and pleasure affected as "0," family disagrees, saying pt is avoiding preferred foods  Pt scores "0" for swallowing pills- note that pt w/o challenges related to current pill routine but anything larger which could not be crushed would likely cause a problem.   TODAY'S TREATMENT:                                                                                                                                         DATE:  10/10/22: Daughter Misty Stanley here today with Frank/wife. With applesauce and water Homero Fellers req'd consistent mod cues for following swallow precautions of double/triple swallows and alternating bite/sip, faded to rare min A for double/triple swallows, and occasional min A for alternating. He req'd consistent min A initially for smaller bites and sips, eventually was independent with this after 5 minutes of reduced frequency cueing. SLP believes Lisa's assessment is correct ("S" statement). With HEP, daughter Misty Stanley  was excellent with cues and with procedure. SLP reiterated June 20th for BID completion then x3/week afterwards.   10/05/22: Pt's daughters going over BID and helping pt with exercises. SLP told pt and family today that pt will need to maintain HEP x3 days/week AFTER 12/01/22 in order to maintain any strength obtained from doing HEP daily. Today SLP assisted pt with occasional min cueing with swallow precautions faded to independent. Pt's daughter did HEP with pt today - SLP ensured daughter knew about extent of lingual protrusion for [place]. She req'd palpation of thyroid to cue pt when to hold on [place]. This was done appropriately - dtr doing well with cueing pt. Pt  req'd mod cues usually for correct completion.  10/03/22: SLP initiated education regarding aspiration signs/symptoms and precautions. Pt's daughter reports intermittent throat clears and use of liquid washes have been implemented during meals (with caregiver cues) to reduce risk of aspiration. Recommend BID oral care, maintain active and healthy lifestyle, manage GERD as additional precautions.   Pt able to teach back 2/3 swallow exercises which are part of HEP. SLP provided clarification regarding tongue press + effortful swallow as pt was completing separately. Pt able to complete as single exercise with visual cues for timing of tongue press. Given model and verbal cues, pt completing x10 tongue press + hard swallow, x10 mendelsohn maneuver, x10 masako swallows. Pt benefiting from cues for intermittent sips of water to facilitate successful exercise completion in succession, as increased challenge was noted 2/2 dry mouth. May be able to consider increasing repetitions if pt demonstrates ability to complete current regimen with ease.   09/29/22 (eval): SLP trained pt and wife as well as daughter Arline Asp in HEP provided by [facility]. Trained pt and wife and daughter re: safe swallow precautions.   PATIENT EDUCATION: Education details:  HEP procedure, safe swallow precautions, rationale for HEP Person educated: Patient, Spouse, and Child(ren) Education method: Explanation, Demonstration, Verbal cues, and Handouts Education comprehension: verbalized understanding, returned demonstration, verbal cues required, and needs further education   ASSESSMENT:  CLINICAL IMPRESSION: Patient is a 89 y.o. male who was seen today for ST following FEES at [facility] on 09/20/22. See above for more details about treatment today. Frequency of HEP is as prescribed. Pt is known to this SLP from previous therapy course; pt had not been performing HEP since the previous course of therapy ended in September 2021. Pt also with mod hoarseness. Pt completed HEP in 2021 at suboptimal frequency; SLP explained to pt rationale for at least completing 20-30 reps of HEP each day.  OBJECTIVE IMPAIRMENTS: include memory, voice disorder, and dysphagia. These impairments are limiting patient from safety when swallowing. Factors affecting potential to achieve goals and functional outcome are ability to learn/carryover information and cooperation/participation level. Patient will benefit from skilled SLP services to address above impairments and improve overall function.  REHAB POTENTIAL: Fair given severity, previous questionable participation in tx exercisds   GOALS: Goals reviewed with patient? No  SHORT TERM GOALS: Target date: 11/01/22  Pt will tell SLP 3 overt s/sx aspiration PNA with modified independence in 2 sessions Baseline: Goal status: INITIAL  2.  Pt will complete HEP for dysphagia with occasional min A in 2 sessions Baseline:  Goal status: INITIAL  3.  Pt will follow safe swallow precautions from FEES on 09/20/22 in 3 sessions, with written cues and rare min A Baseline:  Goal status: INITIAL   LONG TERM GOALS: Target date: 12/08/22  Pt will improve EAT-10 score compared to initial administration Baseline:  Goal status: INITIAL  2.  Pt will  complete HEP for dysphagia with modified independence in 3 sessions Baseline:  Goal status: INITIAL  3.  Pt will follow safe swallow precautions from FEES on 09/20/22 in 3 sessions, with modified independence Baseline:  Goal status: INITIAL   PLAN:  SLP FREQUENCY: 1-2x/week  SLP DURATION: 8 weeks  PLANNED INTERVENTIONS:  Aspiration precaution training, Pharyngeal strengthening exercises, Diet toleration management , Trials of upgraded texture/liquids, Internal/external aids, SLP instruction and feedback, Compensatory strategies, and Patient/family education   [facility], CCC-SLP 10/10/2022, 8:52 AM

## 2022-10-12 ENCOUNTER — Ambulatory Visit: Payer: Medicare Other | Attending: Otolaryngology

## 2022-10-12 DIAGNOSIS — R490 Dysphonia: Secondary | ICD-10-CM | POA: Diagnosis present

## 2022-10-12 DIAGNOSIS — R1312 Dysphagia, oropharyngeal phase: Secondary | ICD-10-CM | POA: Diagnosis present

## 2022-10-12 NOTE — Therapy (Signed)
[facility] OUTPATIENT SPEECH LANGUAGE PATHOLOGY TREATMENT NOTE   Patient Name: Julian Brown MRN: [id number] DOB:02/06/1933, 89 y.o., male Today's Date: 10/12/2022  PCP: [name], MD REFERRING PROVIDER: Corey Skains, MD  END OF SESSION:  End of Session - 10/12/22 1235     Visit Number 5    Number of Visits 17    Date for SLP Re-Evaluation 12/08/22    SLP Start Time 1235    SLP Stop Time  1315    SLP Time Calculation (min) 40 min    Activity Tolerance Patient tolerated treatment well              Past Medical History:  Diagnosis Date   Acute gangrenous cholecystitis s/p lap cholecystectomy 06/08/2017 06/08/2017   Arthritis    Colon cancer [facility])    ED (erectile dysfunction)    Emphysema of lung (HCC)    Hyperlipemia    Hypertension    Prostate cancer (HCC)    prostate cancer-tx radiation   Wears glasses    Past Surgical History:  Procedure Laterality Date   APPENDECTOMY     CARPAL TUNNEL RELEASE  5/12   lt   CARPAL TUNNEL RELEASE  12/13/2011   Procedure: CARPAL TUNNEL RELEASE;  Surgeon: Wyn Forster., MD;  Location: [facility];  Service: Orthopedics;  Laterality: Right;  and inject right wrist   CHOLECYSTECTOMY N/A 06/08/2017   Procedure: LAPAROSCOPIC CHOLECYSTECTOMY WITH LYSIS OF ADHESIONS;  Surgeon: Ovidio Kin, MD;  Location: [facility];  Service: General;  Laterality: N/A;   COLON SURGERY  1997   hemicolectomy-rt-ca   COLONOSCOPY     about 12 inches of colon removed due to colon cancer   IR GASTROSTOMY TUBE MOD SED  11/06/2019   IR GASTROSTOMY TUBE REMOVAL  01/22/2020   Patient Active Problem List   Diagnosis Date Noted   Malignant neoplasm of supraglottis (HCC) 08/08/2019   Hoarseness of voice 06/12/2019   Cough 05/23/2019   Primary osteoarthritis of right wrist 07/31/2018   Stage 2 moderate COPD by GOLD classification (HCC) 05/16/2018   Allergic rhinitis 05/16/2018   Nocturnal hypoxemia 05/16/2018   Abnormal finding on lung  imaging 05/16/2018   Hypertension    Hyperlipemia    Acute gangrenous cholecystitis s/p lap cholecystectomy 06/08/2017 06/08/2017   Pain in right wrist 01/04/2017   Chronic pain of left knee 01/04/2017   Chronic pain of right knee 01/04/2017   Unilateral primary osteoarthritis, left knee 01/04/2017   Unilateral primary osteoarthritis, right knee 01/04/2017    ONSET DATE: <6 months ago; script dated 09/20/22  REFERRING DIAG: Dysphagia, pharyngoesophageal stage  THERAPY DIAG:  Dysphagia, oropharyngeal phase  Hoarseness  Rationale for Evaluation and Treatment: Rehabilitation  SUBJECTIVE:   SUBJECTIVE STATEMENT: Less coughing heard with thicker liquids than thin liquids, per Misty Stanley (daughter). "I'm not sure if he's doing double swallows and small bites and sips at home."  PERTINENT HISTORY: Pt with hx of laryngeal cancer treated with rad in 2021. Pt performed HEP average <7 days/week once per day (suboptimal completion).  PAIN:  Are you having pain? No  PATIENT GOALS: Improve swallowing  OBJECTIVE:   DIAGNOSTIC FINDINGS: Esophagram had to be ended due to pt aspiration of large sip of barium.  RECOMMENDATIONS FROM OBJECTIVE SWALLOW STUDY (MBSS/FEES):  From [facility]: "There has been a decline in patients swallowing function compared to his prior swallow evaluation in September. Patient presents with moderate oropharyngeal dysphagia most notably pharyngeal residue has increased as well as increased  incidences of laryngeal penetration. Aspiration precautions discussed and encouraged. Reassurance provided that there is no residual pill or otherwise observed in the throat. We are unable to assess and to the esophagus and this could certainly correlate." "Dysphagia may be related to progressive changes associated with prior head and neck cancer treatment and slash or generalized weakness given concern for weight loss today. Patient is willing to pursue outpatient swallow therapy if  this can be coordinated closer to home. He is also agreeable to Rd. guidance given weight loss. Further esophageal assessment could be beneficial to rule out impact as well." Objective swallow impairments: Prolonged oral prep phase with solids delayed swallow initiation with liquids. Decreased base of tongue retraction, decreased pharyngeal constriction, incomplete laryngeal vestibule closure.  Objective recommended compensations: Small bites and sips, intermittently clear throat and cough, alternate bites and sips, dry swallow once or twice for each bite or sip COGNITION: Overall cognitive status: Impaired Areas of impairment:  Memory: Impaired: Short term Functional deficits: Pt had difficulty with instructions of HEP, and recall of frequency of overt s/sx pharygneal stage deficits    PATIENT REPORTED OUTCOME MEASURES (PROM): EAT-10: 12 "2": weight, going out to eat, food sticking in throat "3": coughing when eating, solids take extra effort   Pt scores stress related to eating and pleasure affected as "0," family disagrees, saying pt is avoiding preferred foods  Pt scores "0" for swallowing pills- note that pt w/o challenges related to current pill routine but anything larger which could not be crushed would likely cause a problem.   TODAY'S TREATMENT:                                                                                                                                         DATE:  10/12/22: SLP and pt/daughter thinking pt fatigue plays role in pt's success with POs. Today, told dtr Cindy to perform HEP AFTER eating instead of before to max pt's stamina for POs.  SLP needed to (again) provide usual min cues for pt's swallow precautions, faded to mod I for double/triple swallows and alternate bite/sip.  Pt with occasional throat clear. Pt wife to assist with precautions when daughters not present.No overt s/sx aspiration PNA observed nor reported by family. With HEP, dtr Cindy  performing exercises with pt with excellent cues. SLP thinks pt can decr to once/week. To assess if need to keep once/week or go to every other week next session.   10/10/22: Daughter Misty Stanley here today with Frank/wife. With applesauce and water Homero Fellers req'd consistent mod cues for following swallow precautions of double/triple swallows and alternating bite/sip, faded to rare min A for double/triple swallows, and occasional min A for alternating. He req'd consistent min A initially for smaller bites and sips, eventually was independent with this after 5 minutes of reduced frequency cueing. SLP believes Lisa's assessment is correct ("S" statement). With HEP, daughter Misty Stanley was  excellent with cues and with procedure. SLP reiterated June 20th for BID completion then x3/week afterwards.   10/05/22: Pt's daughters going over BID and helping pt with exercises. SLP told pt and family today that pt will need to maintain HEP x3 days/week AFTER 12/01/22 in order to maintain any strength obtained from doing HEP daily. Today SLP assisted pt with occasional min cueing with swallow precautions faded to independent. Pt's daughter did HEP with pt today - SLP ensured daughter knew about extent of lingual protrusion for Masako. She req'd palpation of thyroid to cue pt when to hold on [place]. This was done appropriately - dtr doing well with cueing pt. Pt req'd mod cues usually for correct completion.  10/03/22: SLP initiated education regarding aspiration signs/symptoms and precautions. Pt's daughter reports intermittent throat clears and use of liquid washes have been implemented during meals (with caregiver cues) to reduce risk of aspiration. Recommend BID oral care, maintain active and healthy lifestyle, manage GERD as additional precautions.   Pt able to teach back 2/3 swallow exercises which are part of HEP. SLP provided clarification regarding tongue press + effortful swallow as pt was completing separately. Pt able to  complete as single exercise with visual cues for timing of tongue press. Given model and verbal cues, pt completing x10 tongue press + hard swallow, x10 mendelsohn maneuver, x10 masako swallows. Pt benefiting from cues for intermittent sips of water to facilitate successful exercise completion in succession, as increased challenge was noted 2/2 dry mouth. May be able to consider increasing repetitions if pt demonstrates ability to complete current regimen with ease.   09/29/22 (eval): SLP trained pt and wife as well as daughter Arline Asp in HEP provided by [facility]. Trained pt and wife and daughter re: safe swallow precautions.   PATIENT EDUCATION: Education details: HEP procedure, safe swallow precautions, rationale for HEP Person educated: Patient, Spouse, and Child(ren) Education method: Explanation, Demonstration, Verbal cues, and Handouts Education comprehension: verbalized understanding, returned demonstration, verbal cues required, and needs further education   ASSESSMENT:  CLINICAL IMPRESSION: Patient is a 89 y.o. male who was seen today for ST following FEES at [facility] on 09/20/22. See above for more details about treatment today. Frequency of HEP is as prescribed. Pt is known to this SLP from previous therapy course; pt had not been performing HEP since the previous course of therapy ended in September 2021. Pt also with mod hoarseness. Pt completed HEP in 2021 at suboptimal frequency; SLP explained to pt rationale for at least completing 20-30 reps of HEP each day.  OBJECTIVE IMPAIRMENTS: include memory, voice disorder, and dysphagia. These impairments are limiting patient from safety when swallowing. Factors affecting potential to achieve goals and functional outcome are ability to learn/carryover information and cooperation/participation level. Patient will benefit from skilled SLP services to address above impairments and improve overall function.  REHAB POTENTIAL: Fair given severity,  previous questionable participation in tx exercisds   GOALS: Goals reviewed with patient? No  SHORT TERM GOALS: Target date: 11/01/22  Pt will tell SLP 3 overt s/sx aspiration PNA with modified independence in 2 sessions Baseline: Goal status: INITIAL  2.  Pt will complete HEP for dysphagia with usual min A in 2 sessions Baseline:  Goal status: REVISED  3.  Pt will follow safe swallow precautions from FEES on 09/20/22 in 3 sessions, with written cues and rare min A Baseline:  Goal status: INITIAL   LONG TERM GOALS: Target date: 12/08/22  Pt will improve EAT-10 score compared to  initial administration Baseline:  Goal status: INITIAL  2.  Pt will complete HEP for dysphagia with modified independence in 3 sessions Baseline:  Goal status: INITIAL  3.  Pt will follow safe swallow precautions from FEES on 09/20/22 in 3 sessions, with modified independence Baseline:  Goal status: INITIAL   PLAN:  SLP FREQUENCY: 1-2x/week  SLP DURATION: 8 weeks  PLANNED INTERVENTIONS:  Aspiration precaution training, Pharyngeal strengthening exercises, Diet toleration management , Trials of upgraded texture/liquids, Internal/external aids, SLP instruction and feedback, Compensatory strategies, and Patient/family education   [facility], CCC-SLP 10/12/2022, 5:36 PM

## 2022-10-13 ENCOUNTER — Inpatient Hospital Stay: Payer: Medicare Other | Attending: Adult Health | Admitting: Nutrition

## 2022-10-13 NOTE — Progress Notes (Signed)
89 year old male was treated for supraglottic cancer by Dr. Basilio Cairo and completed treatment on October 08, 2019.  There is no evidence of disease progression.  Patient is seeing Dr. Hezzie Bump with [facility].  Per family patient has been having increased difficulty swallowing.  He has had unintentional weight loss.  Patient weighed 157.6 pounds today.  This is decreased from 173 pounds 3 ounces May 31, 2022.  Per speech therapy notes, patient has been educated to sit upright during oral intake and take small sips and bites.  He was encouraged to have 1-2 additional dry swallows with each bite and sip as well as cough/clear throat intermittently during the meal.  Patient's daughter reports patient is doing well when cued.  She would like to know what kinds of food patient can consume.  Educated to provide soft moist foods in small amounts.  Encouraged chopping foods very small and adding moisture or using a dipping sauce to aid in swallowing.  Reviewed higher calorie, higher protein foods to minimize further weight loss.  Encouraged patient to consume Ensure Plus or equivalent 3 times a day.  Encourage patient and family to monitor weight.  No further nutrition interventions at this time as patient in not currently in treatment at [facility]

## 2022-10-20 ENCOUNTER — Ambulatory Visit: Payer: Medicare Other

## 2022-10-20 DIAGNOSIS — R1312 Dysphagia, oropharyngeal phase: Secondary | ICD-10-CM

## 2022-10-20 DIAGNOSIS — R490 Dysphonia: Secondary | ICD-10-CM

## 2022-10-20 NOTE — Patient Instructions (Addendum)
Signs of Aspiration Pneumonia   Chest pain/tightness Fever (can be low grade) Cough  With foul-smelling phlegm (sputum) With sputum containing pus or blood With greenish sputum Fatigue  Shortness of breath  Wheezing   **IF YOU HAVE THESE SIGNS, CONTACT YOUR DOCTOR OR GO TO THE EMERGENCY DEPARTMENT OR URGENT CARE AS SOON AS POSSIBLE**     Please fax referral for instrumental swallowing assessment (MBS, FEES) to [phone]. If you need to send referral via Epic, please call [phone] and talk with Dahlia Client or Bjorn Loser about how to correctly route this referral.

## 2022-10-20 NOTE — Therapy (Signed)
[facility] OUTPATIENT SPEECH LANGUAGE PATHOLOGY TREATMENT NOTE   Patient Name: Julian Brown MRN: [id number] DOB:1932-11-23, 89 y.o., male Today's Date: 10/20/2022  PCP: [name], MD REFERRING PROVIDER: Corey Skains, MD  END OF SESSION:  End of Session - 10/20/22 1402     Visit Number 6    Number of Visits 17    Date for SLP Re-Evaluation 12/08/22    SLP Start Time 1403    SLP Stop Time  1445    SLP Time Calculation (min) 42 min    Activity Tolerance Patient tolerated treatment well              Past Medical History:  Diagnosis Date   Acute gangrenous cholecystitis s/p lap cholecystectomy 06/08/2017 06/08/2017   Arthritis    Colon cancer [facility])    ED (erectile dysfunction)    Emphysema of lung (HCC)    Hyperlipemia    Hypertension    Prostate cancer (HCC)    prostate cancer-tx radiation   Wears glasses    Past Surgical History:  Procedure Laterality Date   APPENDECTOMY     CARPAL TUNNEL RELEASE  5/12   lt   CARPAL TUNNEL RELEASE  12/13/2011   Procedure: CARPAL TUNNEL RELEASE;  Surgeon: Wyn Forster., MD;  Location: [facility];  Service: Orthopedics;  Laterality: Right;  and inject right wrist   CHOLECYSTECTOMY N/A 06/08/2017   Procedure: LAPAROSCOPIC CHOLECYSTECTOMY WITH LYSIS OF ADHESIONS;  Surgeon: Ovidio Kin, MD;  Location: [facility];  Service: General;  Laterality: N/A;   COLON SURGERY  1997   hemicolectomy-rt-ca   COLONOSCOPY     about 12 inches of colon removed due to colon cancer   IR GASTROSTOMY TUBE MOD SED  11/06/2019   IR GASTROSTOMY TUBE REMOVAL  01/22/2020   Patient Active Problem List   Diagnosis Date Noted   Malignant neoplasm of supraglottis (HCC) 08/08/2019   Hoarseness of voice 06/12/2019   Cough 05/23/2019   Primary osteoarthritis of right wrist 07/31/2018   Stage 2 moderate COPD by GOLD classification (HCC) 05/16/2018   Allergic rhinitis 05/16/2018   Nocturnal hypoxemia 05/16/2018   Abnormal finding on lung  imaging 05/16/2018   Hypertension    Hyperlipemia    Acute gangrenous cholecystitis s/p lap cholecystectomy 06/08/2017 06/08/2017   Pain in right wrist 01/04/2017   Chronic pain of left knee 01/04/2017   Chronic pain of right knee 01/04/2017   Unilateral primary osteoarthritis, left knee 01/04/2017   Unilateral primary osteoarthritis, right knee 01/04/2017    ONSET DATE: <6 months ago; script dated 09/20/22  REFERRING DIAG: Dysphagia, pharyngoesophageal stage  THERAPY DIAG:  Dysphagia, oropharyngeal phase  Hoarseness  Rationale for Evaluation and Treatment: Rehabilitation  SUBJECTIVE:   SUBJECTIVE STATEMENT: "The last 3-4 days he hasn't coughed much at all when he's eaten."  PERTINENT HISTORY: Pt with hx of laryngeal cancer treated with rad in 2021. Pt performed HEP average <7 days/week once per day (suboptimal completion).  PAIN:  Are you having pain? No  PATIENT GOALS: Improve swallowing  OBJECTIVE:   DIAGNOSTIC FINDINGS: Esophagram had to be ended due to pt aspiration of large sip of barium.  RECOMMENDATIONS FROM OBJECTIVE SWALLOW STUDY (MBSS/FEES):  From [facility]: "There has been a decline in patients swallowing function compared to his prior swallow evaluation in September. Patient presents with moderate oropharyngeal dysphagia most notably pharyngeal residue has increased as well as increased incidences of laryngeal penetration. Aspiration precautions discussed and encouraged. Reassurance provided that there is  no residual pill or otherwise observed in the throat. We are unable to assess and to the esophagus and this could certainly correlate." "Dysphagia may be related to progressive changes associated with prior head and neck cancer treatment and slash or generalized weakness given concern for weight loss today. Patient is willing to pursue outpatient swallow therapy if this can be coordinated closer to home. He is also agreeable to Rd. guidance given weight loss.  Further esophageal assessment could be beneficial to rule out impact as well." Objective swallow impairments: Prolonged oral prep phase with solids delayed swallow initiation with liquids. Decreased base of tongue retraction, decreased pharyngeal constriction, incomplete laryngeal vestibule closure.  Objective recommended compensations: Small bites and sips, intermittently clear throat and cough, alternate bites and sips, dry swallow once or twice for each bite or sip COGNITION: Overall cognitive status: Impaired Areas of impairment:  Memory: Impaired: Short term Functional deficits: Pt had difficulty with instructions of HEP, and recall of frequency of overt s/sx pharygneal stage deficits    PATIENT REPORTED OUTCOME MEASURES (PROM): EAT-10: 12 "2": weight, going out to eat, food sticking in throat "3": coughing when eating, solids take extra effort   Pt scores stress related to eating and pleasure affected as "0," family disagrees, saying pt is avoiding preferred foods  Pt scores "0" for swallowing pills- note that pt w/o challenges related to current pill routine but anything larger which could not be crushed would likely cause a problem.   TODAY'S TREATMENT:                                                                                                                                         DATE:  10/20/22: Arline Asp states pt is following through with HEP x2/day with 15 reps each exercise BID. Today Cindy completed these exercises with excellent guidance. Pt will use a mirror to monitor tongue protrusion with Masako. Pt without any overt s/sx of aspiration PNA. Pt is very likely not using swallowing precautions at home as Arline Asp reports daughters have to cue pt to use precautions each time they are there with pt's POs. SLP provided handout of overt s/sx of aspiration PNA. Pt, daughter, and SLP agreed pt could return in 2 weeks. MBS scheduled at [facility] however dtr wants to see if eval can  be scheduled in [place]. SLP contacted pt's [facility] SLP and inquired how to do that with best chance for accuracy.  10/12/22: SLP and pt/daughter thinking pt fatigue plays role in pt's success with POs. Today, told dtr Cindy to perform HEP AFTER eating instead of before to max pt's stamina for POs.  SLP needed to (again) provide usual min cues for pt's swallow precautions, faded to mod I for double/triple swallows and alternate bite/sip.  Pt with occasional throat clear. Pt wife to assist with precautions when daughters not present.No overt s/sx aspiration PNA observed nor reported by family.  With HEP, dtr Cindy performing exercises with pt with excellent cues. SLP thinks pt can decr to once/week. To assess if need to keep once/week or go to every other week next session.  10/10/22: Daughter Misty Stanley here today with Frank/wife. With applesauce and water Homero Fellers req'd consistent mod cues for following swallow precautions of double/triple swallows and alternating bite/sip, faded to rare min A for double/triple swallows, and occasional min A for alternating. He req'd consistent min A initially for smaller bites and sips, eventually was independent with this after 5 minutes of reduced frequency cueing. SLP believes Lisa's assessment is correct ("S" statement). With HEP, daughter Misty Stanley was excellent with cues and with procedure. SLP reiterated June 20th for BID completion then x3/week afterwards.   10/05/22: Pt's daughters going over BID and helping pt with exercises. SLP told pt and family today that pt will need to maintain HEP x3 days/week AFTER 12/01/22 in order to maintain any strength obtained from doing HEP daily. Today SLP assisted pt with occasional min cueing with swallow precautions faded to independent. Pt's daughter did HEP with pt today - SLP ensured daughter knew about extent of lingual protrusion for Masako. She req'd palpation of thyroid to cue pt when to hold on [place]. This was done appropriately - dtr doing  well with cueing pt. Pt req'd mod cues usually for correct completion.  10/03/22: SLP initiated education regarding aspiration signs/symptoms and precautions. Pt's daughter reports intermittent throat clears and use of liquid washes have been implemented during meals (with caregiver cues) to reduce risk of aspiration. Recommend BID oral care, maintain active and healthy lifestyle, manage GERD as additional precautions.   Pt able to teach back 2/3 swallow exercises which are part of HEP. SLP provided clarification regarding tongue press + effortful swallow as pt was completing separately. Pt able to complete as single exercise with visual cues for timing of tongue press. Given model and verbal cues, pt completing x10 tongue press + hard swallow, x10 mendelsohn maneuver, x10 masako swallows. Pt benefiting from cues for intermittent sips of water to facilitate successful exercise completion in succession, as increased challenge was noted 2/2 dry mouth. May be able to consider increasing repetitions if pt demonstrates ability to complete current regimen with ease.   09/29/22 (eval): SLP trained pt and wife as well as daughter Arline Asp in HEP provided by [facility]. Trained pt and wife and daughter re: safe swallow precautions.   PATIENT EDUCATION: Education details: HEP procedure, overt s/sx aspiration PNA Person educated: Patient, Spouse, and Child(ren) Education method: Explanation and Handouts Education comprehension: verbalized understanding and needs further education   ASSESSMENT:  CLINICAL IMPRESSION: Patient is a 89 y.o. male who was seen today for ST following FEES at [facility] on 09/20/22. See above for more details about treatment today. Frequency of HEP is as prescribed. Pt is known to this SLP from previous therapy course; pt had not been performing HEP since the previous course of therapy ended in September 2021. Pt also with mod hoarseness. Pt completed HEP in 2021 at suboptimal frequency. SLP  shared overt s/sx asp PNA.  OBJECTIVE IMPAIRMENTS: include memory, voice disorder, and dysphagia. These impairments are limiting patient from safety when swallowing. Factors affecting potential to achieve goals and functional outcome are ability to learn/carryover information and cooperation/participation level. Patient will benefit from skilled SLP services to address above impairments and improve overall function.  REHAB POTENTIAL: Fair given severity, previous questionable participation in tx exercisds   GOALS: Goals reviewed with patient? No  SHORT TERM GOALS: Target date: 11/01/22  Pt will tell SLP 3 overt s/sx aspiration PNA with modified independence in 2 sessions Baseline: 10/20/22 Goal status: INITIAL  2.  Pt will complete HEP for dysphagia with usual min A in 2 sessions Baseline:  Goal status: REVISED  3.  Pt will follow safe swallow precautions from FEES on 09/20/22 in 3 sessions, with written cues and rare min A Baseline:  Goal status: INITIAL   LONG TERM GOALS: Target date: 12/08/22  Pt will improve EAT-10 score compared to initial administration Baseline:  Goal status: INITIAL  2.  Pt will complete HEP for dysphagia with modified independence in 3 sessions Baseline:  Goal status: INITIAL  3.  Pt will follow safe swallow precautions from FEES on 09/20/22 in 3 sessions, with modified independence Baseline:  Goal status: INITIAL   PLAN:  SLP FREQUENCY: 1-2x/week  SLP DURATION: 8 weeks  PLANNED INTERVENTIONS:  Aspiration precaution training, Pharyngeal strengthening exercises, Diet toleration management , Trials of upgraded texture/liquids, Internal/external aids, SLP instruction and feedback, Compensatory strategies, and Patient/family education   [facility], CCC-SLP 10/20/2022, 2:03 PM

## 2022-10-25 ENCOUNTER — Ambulatory Visit: Payer: Medicare Other

## 2022-11-03 ENCOUNTER — Ambulatory Visit: Payer: Medicare Other

## 2022-11-03 DIAGNOSIS — R1312 Dysphagia, oropharyngeal phase: Secondary | ICD-10-CM | POA: Diagnosis not present

## 2022-11-03 DIAGNOSIS — R490 Dysphonia: Secondary | ICD-10-CM

## 2022-11-03 NOTE — Therapy (Signed)
[facility] OUTPATIENT SPEECH LANGUAGE PATHOLOGY TREATMENT NOTE   Patient Name: Julian Brown MRN: [id number] DOB:July 04, 1932, 89 y.o., male Today's Date: 10/20/2022  PCP: [name], MD REFERRING PROVIDER: Corey Skains, MD  END OF SESSION:  End of Session - 10/20/22 1402     Visit Number 6    Number of Visits 17    Date for SLP Re-Evaluation 12/08/22    SLP Start Time 1403    SLP Stop Time  1445    SLP Time Calculation (min) 42 min    Activity Tolerance Patient tolerated treatment well              Past Medical History:  Diagnosis Date   Acute gangrenous cholecystitis s/p lap cholecystectomy 06/08/2017 06/08/2017   Arthritis    Colon cancer [facility])    ED (erectile dysfunction)    Emphysema of lung (HCC)    Hyperlipemia    Hypertension    Prostate cancer (HCC)    prostate cancer-tx radiation   Wears glasses    Past Surgical History:  Procedure Laterality Date   APPENDECTOMY     CARPAL TUNNEL RELEASE  5/12   lt   CARPAL TUNNEL RELEASE  12/13/2011   Procedure: CARPAL TUNNEL RELEASE;  Surgeon: Wyn Forster., MD;  Location: [facility];  Service: Orthopedics;  Laterality: Right;  and inject right wrist   CHOLECYSTECTOMY N/A 06/08/2017   Procedure: LAPAROSCOPIC CHOLECYSTECTOMY WITH LYSIS OF ADHESIONS;  Surgeon: Ovidio Kin, MD;  Location: [facility];  Service: General;  Laterality: N/A;   COLON SURGERY  1997   hemicolectomy-rt-ca   COLONOSCOPY     about 12 inches of colon removed due to colon cancer   IR GASTROSTOMY TUBE MOD SED  11/06/2019   IR GASTROSTOMY TUBE REMOVAL  01/22/2020   Patient Active Problem List   Diagnosis Date Noted   Malignant neoplasm of supraglottis (HCC) 08/08/2019   Hoarseness of voice 06/12/2019   Cough 05/23/2019   Primary osteoarthritis of right wrist 07/31/2018   Stage 2 moderate COPD by GOLD classification (HCC) 05/16/2018   Allergic rhinitis 05/16/2018   Nocturnal hypoxemia 05/16/2018   Abnormal finding on lung  imaging 05/16/2018   Hypertension    Hyperlipemia    Acute gangrenous cholecystitis s/p lap cholecystectomy 06/08/2017 06/08/2017   Pain in right wrist 01/04/2017   Chronic pain of left knee 01/04/2017   Chronic pain of right knee 01/04/2017   Unilateral primary osteoarthritis, left knee 01/04/2017   Unilateral primary osteoarthritis, right knee 01/04/2017    ONSET DATE: <6 months ago; script dated 09/20/22  REFERRING DIAG: Dysphagia, pharyngoesophageal stage  THERAPY DIAG:  Dysphagia, oropharyngeal phase  Hoarseness  Rationale for Evaluation and Treatment: Rehabilitation  SUBJECTIVE:   SUBJECTIVE STATEMENT: "The other day when I came over there was a note on the door they were out getting a cheeseburger." Arline Asp)  PERTINENT HISTORY: Pt with hx of laryngeal cancer treated with rad in 2021. Pt performed HEP average <7 days/week once per day (suboptimal completion).  PAIN:  Are you having pain? No  PATIENT GOALS: Improve swallowing  OBJECTIVE:   DIAGNOSTIC FINDINGS: Esophagram had to be ended due to pt aspiration of large sip of barium.  RECOMMENDATIONS FROM OBJECTIVE SWALLOW STUDY (MBSS/FEES):  From [facility]: "There has been a decline in patients swallowing function compared to his prior swallow evaluation in September. Patient presents with moderate oropharyngeal dysphagia most notably pharyngeal residue has increased as well as increased incidences of laryngeal penetration. Aspiration precautions  discussed and encouraged. Reassurance provided that there is no residual pill or otherwise observed in the throat. We are unable to assess and to the esophagus and this could certainly correlate." "Dysphagia may be related to progressive changes associated with prior head and neck cancer treatment and slash or generalized weakness given concern for weight loss today. Patient is willing to pursue outpatient swallow therapy if this can be coordinated closer to home. He is also  agreeable to Rd. guidance given weight loss. Further esophageal assessment could be beneficial to rule out impact as well." Objective swallow impairments: Prolonged oral prep phase with solids delayed swallow initiation with liquids. Decreased base of tongue retraction, decreased pharyngeal constriction, incomplete laryngeal vestibule closure.  Objective recommended compensations: Small bites and sips, intermittently clear throat and cough, alternate bites and sips, dry swallow once or twice for each bite or sip COGNITION: Overall cognitive status: Impaired Areas of impairment:  Memory: Impaired: Short term Functional deficits: Pt had difficulty with instructions of HEP, and recall of frequency of overt s/sx pharygneal stage deficits    PATIENT REPORTED OUTCOME MEASURES (PROM): EAT-10: 12 "2": weight, going out to eat, food sticking in throat "3": coughing when eating, solids take extra effort   Pt scores stress related to eating and pleasure affected as "0," family disagrees, saying pt is avoiding preferred foods  Pt scores "0" for swallowing pills- note that pt w/o challenges related to current pill routine but anything larger which could not be crushed would likely cause a problem.   TODAY'S TREATMENT:                                                                                                                                         DATE:  11/02/22: Crab cake, chicken leg, chik-fil-a breaded chicken sandwich have been eaten over the past week. Pt has gained weight since last session. No overt s/sx aspiration PNA today, nor reported by pt or family. HEP completed today with consistent assistance from daughter. Procedure was excellent with this assistance. Pt ate fig bar and drank water today, and did not spontaneously follow precautions other than small sips and bites. No overt s/sx aspiration were noted and pt consistently taking small sips after approx 2 seconds of mastication due to  xerostomia. SLP encouraged pt's wife to ask him to clear throat and reswallow periodically throughout his meals, to make the best benefits for pt.  Arline Asp will attempt to contact referring MD to inquire of MBS possibility in [place] instead of in [place]. Pt, SLP agreed next visit will be after MBS.   10/20/22: Arline Asp states pt is following through with HEP x2/day with 15 reps each exercise BID. Today Cindy completed these exercises with excellent guidance. Pt will use a mirror to monitor tongue protrusion with [place]. Pt without any overt s/sx of aspiration PNA. Pt is very likely not using swallowing precautions at home as  Arline Asp reports daughters have to cue pt to use precautions each time they are there with pt's POs. SLP provided handout of overt s/sx of aspiration PNA. Pt, daughter, and SLP agreed pt could return in 2 weeks. MBS scheduled at [facility] however dtr wants to see if eval can be scheduled in [place]. SLP contacted pt's [facility] SLP and inquired how to do that with best chance for accuracy.  10/12/22: SLP and pt/daughter thinking pt fatigue plays role in pt's success with POs. Today, told dtr Cindy to perform HEP AFTER eating instead of before to max pt's stamina for POs.  SLP needed to (again) provide usual min cues for pt's swallow precautions, faded to mod I for double/triple swallows and alternate bite/sip.  Pt with occasional throat clear. Pt wife to assist with precautions when daughters not present.No overt s/sx aspiration PNA observed nor reported by family. With HEP, dtr Cindy performing exercises with pt with excellent cues. SLP thinks pt can decr to once/week. To assess if need to keep once/week or go to every other week next session.  10/10/22: Daughter Misty Stanley here today with Frank/wife. With applesauce and water Homero Fellers req'd consistent mod cues for following swallow precautions of double/triple swallows and alternating bite/sip, faded to rare min A for double/triple swallows, and  occasional min A for alternating. He req'd consistent min A initially for smaller bites and sips, eventually was independent with this after 5 minutes of reduced frequency cueing. SLP believes Lisa's assessment is correct ("S" statement). With HEP, daughter Misty Stanley was excellent with cues and with procedure. SLP reiterated June 20th for BID completion then x3/week afterwards.   10/05/22: Pt's daughters going over BID and helping pt with exercises. SLP told pt and family today that pt will need to maintain HEP x3 days/week AFTER 12/01/22 in order to maintain any strength obtained from doing HEP daily. Today SLP assisted pt with occasional min cueing with swallow precautions faded to independent. Pt's daughter did HEP with pt today - SLP ensured daughter knew about extent of lingual protrusion for Masako. She req'd palpation of thyroid to cue pt when to hold on [place]. This was done appropriately - dtr doing well with cueing pt. Pt req'd mod cues usually for correct completion.  10/03/22: SLP initiated education regarding aspiration signs/symptoms and precautions. Pt's daughter reports intermittent throat clears and use of liquid washes have been implemented during meals (with caregiver cues) to reduce risk of aspiration. Recommend BID oral care, maintain active and healthy lifestyle, manage GERD as additional precautions.   Pt able to teach back 2/3 swallow exercises which are part of HEP. SLP provided clarification regarding tongue press + effortful swallow as pt was completing separately. Pt able to complete as single exercise with visual cues for timing of tongue press. Given model and verbal cues, pt completing x10 tongue press + hard swallow, x10 mendelsohn maneuver, x10 masako swallows. Pt benefiting from cues for intermittent sips of water to facilitate successful exercise completion in succession, as increased challenge was noted 2/2 dry mouth. May be able to consider increasing repetitions if pt  demonstrates ability to complete current regimen with ease.   09/29/22 (eval): SLP trained pt and wife as well as daughter Arline Asp in HEP provided by [facility]. Trained pt and wife and daughter re: safe swallow precautions.   PATIENT EDUCATION: Education details: HEP procedure, overt s/sx aspiration PNA Person educated: Patient, Spouse, and Child(ren) Education method: Explanation and Handouts Education comprehension: verbalized understanding and needs further education   ASSESSMENT:  CLINICAL IMPRESSION: Patient is a 89 y.o. male who was seen today for ST following FEES at [facility] on 09/20/22. See above for more details about treatment today. He has not been completing aspiration precautions at home except for small bites and sips. Frequency of HEP continues as SLP has prescribed. Pt is known to this SLP from previous therapy course; pt had not been performing HEP since the previous course of therapy ended in September 2021. Pt also with mod hoarseness. Pt completed HEP in 2021 at suboptimal frequency. SLP shared overt s/sx asp PNA.  OBJECTIVE IMPAIRMENTS: include memory, voice disorder, and dysphagia. These impairments are limiting patient from safety when swallowing. Factors affecting potential to achieve goals and functional outcome are ability to learn/carryover information and cooperation/participation level. Patient will benefit from skilled SLP services to address above impairments and improve overall function.  REHAB POTENTIAL: Fair given severity, previous questionable participation in tx exercisds   GOALS: Goals reviewed with patient? No  SHORT TERM GOALS: Target date: 11/01/22  Pt will tell SLP 3 overt s/sx aspiration PNA with modified independence in 2 sessions Baseline: 10/20/22 Goal status: Partially met  2.  Pt will complete HEP for dysphagia with usual min A in 2 sessions Baseline:  Goal status: not met - consistent min A  3.  Pt will follow safe swallow precautions  from FEES on 09/20/22 in 3 sessions, with written cues and rare min A Baseline:  Goal status: Not met   LONG TERM GOALS: Target date: 12/08/22  Pt will improve EAT-10 score compared to initial administration Baseline:  Goal status: INITIAL  2.  Pt will complete HEP for dysphagia with usual min A in 3 sessions Baseline:  Goal status: Modified  3.  Pt will follow safe swallow precautions from FEES on 09/20/22 in 3 sessions, with modified independence Baseline:  Goal status: INITIAL   PLAN:  SLP FREQUENCY: 1-2x/week  SLP DURATION: 8 weeks  PLANNED INTERVENTIONS:  Aspiration precaution training, Pharyngeal strengthening exercises, Diet toleration management , Trials of upgraded texture/liquids, Internal/external aids, SLP instruction and feedback, Compensatory strategies, and Patient/family education   [facility], CCC-SLP 10/20/2022, 2:03 PM

## 2022-11-16 ENCOUNTER — Ambulatory Visit: Payer: Medicare Other

## 2022-11-30 ENCOUNTER — Telehealth: Payer: Self-pay | Admitting: Orthopaedic Surgery

## 2022-11-30 NOTE — Telephone Encounter (Signed)
Patient's daughter Misty Stanley called asked if patient can get cortisone injection in both knees? Also, Misty Stanley asked if it would be beneficial for patient to have (PT) for his knees? The number to contact Misty Stanley is [phone]

## 2022-12-05 ENCOUNTER — Telehealth: Payer: Self-pay | Admitting: Adult Health

## 2022-12-05 NOTE — Telephone Encounter (Signed)
Rescheduled appointment per room/resource. Talked with the patients daughter Arline Asp and she is aware of the changes made to the patients upcoming appointment.

## 2022-12-08 ENCOUNTER — Inpatient Hospital Stay: Payer: Medicare Other | Attending: Adult Health | Admitting: Adult Health

## 2022-12-08 ENCOUNTER — Encounter: Payer: Medicare Other | Admitting: Adult Health

## 2022-12-08 ENCOUNTER — Encounter: Payer: Self-pay | Admitting: Adult Health

## 2022-12-08 ENCOUNTER — Other Ambulatory Visit: Payer: Self-pay

## 2022-12-08 VITALS — BP 138/64 | HR 84 | Temp 97.7°F | Resp 15 | Wt 159.5 lb

## 2022-12-08 DIAGNOSIS — Z87891 Personal history of nicotine dependence: Secondary | ICD-10-CM | POA: Insufficient documentation

## 2022-12-08 DIAGNOSIS — R634 Abnormal weight loss: Secondary | ICD-10-CM | POA: Insufficient documentation

## 2022-12-08 DIAGNOSIS — Z8521 Personal history of malignant neoplasm of larynx: Secondary | ICD-10-CM | POA: Diagnosis not present

## 2022-12-08 DIAGNOSIS — R1310 Dysphagia, unspecified: Secondary | ICD-10-CM | POA: Diagnosis not present

## 2022-12-08 DIAGNOSIS — C321 Malignant neoplasm of supraglottis: Secondary | ICD-10-CM | POA: Diagnosis not present

## 2022-12-08 NOTE — Progress Notes (Signed)
[facility] Cancer Follow up:    [name], MD [address] [place] [address]   DIAGNOSIS:  Cancer Staging  Malignant neoplasm of supraglottis [name] Staging form: Larynx - Supraglottis, AJCC 8th Edition - Clinical stage from 08/06/2019: Stage III (cT3, cN0, cM0) - Signed by Lonie Peak, MD on 08/08/2019 Stage prefix: Initial diagnosis   SUMMARY OF ONCOLOGIC HISTORY: Oncology History  Malignant neoplasm of supraglottis (HCC)  07/12/2019 Surgery   Suspension Micro Direct Laryngoscopy w/C02 laser.Firm cartilagenous mass of the arytenoid area was debulked via CO2 laser. Pathology returned as squamous cell carcinoma  (Dr. Hezzie Bump)   08/06/2019 Cancer Staging   Staging form: Larynx - Supraglottis, AJCC 8th Edition - Clinical stage from 08/06/2019: Stage III (cT3, cN0, cM0) - Signed by Lonie Peak, MD on 08/08/2019   08/08/2019 Initial Diagnosis   Malignant neoplasm of supraglottis (HCC)   08/21/2019 - 10/08/2019 Radiation Therapy   Site Technique Total Dose (Gy) Dose per Fx (Gy) Completed Fx Beam Energies  Larynx: HN_Larynx IMRT 70/70 2 35/35 6X       CURRENT THERAPY: observation and f/u  INTERVAL HISTORY: Julian Brown 89 y.o. male returns for follow-up of his history of squamous cell of the supraglottis.  His most recent CT scan occurred in December 2022 and demonstrated no suspicious pulmonary nodules, aortic atherosclerosis sclerosis, pulmonary arterial hypertension, and emphysema.  He continues to see his primary care provider regularly. Over Easter he developed swallowing difficulty and underwent a swallow eval that found that he was aspirating.  He went to PT and worked on swallowing exercises.  His most recent swallowing exam occurred on June 20 and showed much improvement and he was able to back down his exercises to once per day.  -Pain: none -Nutrition/Diet: regular with ensure, has met with nutrition -Dysphagia?:see interval history -Dental  issues?: none -Last TSH: completed last week with Dr. Felipa Eth -Weight: down 14 pounds since 05/2022   -Last ENT visit:  08/2022 -Last Rad Onc visit: 05/2021 -Last Dentist visit: (sees every 3 months,)   Patient Active Problem List   Diagnosis Date Noted   Malignant neoplasm of supraglottis (HCC) 08/08/2019   Hoarseness of voice 06/12/2019   Cough 05/23/2019   Primary osteoarthritis of right wrist 07/31/2018   Stage 2 moderate COPD by GOLD classification (HCC) 05/16/2018   Allergic rhinitis 05/16/2018   Nocturnal hypoxemia 05/16/2018   Abnormal finding on lung imaging 05/16/2018   Hypertension    Hyperlipemia    Acute gangrenous cholecystitis s/p lap cholecystectomy 06/08/2017 06/08/2017   Pain in right wrist 01/04/2017   Chronic pain of left knee 01/04/2017   Chronic pain of right knee 01/04/2017   Unilateral primary osteoarthritis, left knee 01/04/2017   Unilateral primary osteoarthritis, right knee 01/04/2017    is allergic to fenofibrate, other, amoxicillin-pot clavulanate, and penicillins.  MEDICAL HISTORY: Past Medical History:  Diagnosis Date   Acute gangrenous cholecystitis s/p lap cholecystectomy 06/08/2017 06/08/2017   Arthritis    Colon cancer [facility])    ED (erectile dysfunction)    Emphysema of lung (HCC)    Hyperlipemia    Hypertension    Prostate cancer (HCC)    prostate cancer-tx radiation   Wears glasses     SURGICAL HISTORY: Past Surgical History:  Procedure Laterality Date   APPENDECTOMY     CARPAL TUNNEL RELEASE  5/12   lt   CARPAL TUNNEL RELEASE  12/13/2011   Procedure: CARPAL TUNNEL RELEASE;  Surgeon: Wyn Forster., MD;  Location: [facility];  Service: Orthopedics;  Laterality: Right;  and inject right wrist   CHOLECYSTECTOMY N/A 06/08/2017   Procedure: LAPAROSCOPIC CHOLECYSTECTOMY WITH LYSIS OF ADHESIONS;  Surgeon: Ovidio Kin, MD;  Location: [facility];  Service: General;  Laterality: N/A;   COLON SURGERY  1997    hemicolectomy-rt-ca   COLONOSCOPY     about 12 inches of colon removed due to colon cancer   IR GASTROSTOMY TUBE MOD SED  11/06/2019   IR GASTROSTOMY TUBE REMOVAL  01/22/2020    SOCIAL HISTORY: Social History   Socioeconomic History   Marital status: Married    Spouse name: Not on file   Number of children: 2   Years of education: Not on file   Highest education level: Not on file  Occupational History   Not on file  Tobacco Use   Smoking status: Former    Packs/day: 1.00    Years: 36.00    Additional pack years: 0.00    Total pack years: 36.00    Types: Cigarettes    Quit date: 12/09/1986    Years since quitting: 36.0   Smokeless tobacco: Never  Vaping Use   Vaping Use: Never used  Substance and Sexual Activity   Alcohol use: Yes    Comment: occ   Drug use: No   Sexual activity: Not on file  Other Topics Concern   Not on file  Social History Narrative   Not on file   Social Determinants of Health   Financial Resource Strain: Not on file  Food Insecurity: Not on file  Transportation Needs: Not on file  Physical Activity: Not on file  Stress: Not on file  Social Connections: Not on file  Intimate Partner Violence: Not on file    FAMILY HISTORY: No family history on file.  Review of Systems  Constitutional:  Negative for appetite change, chills, fatigue, fever and unexpected weight change.  HENT:   Positive for trouble swallowing. Negative for hearing loss, lump/mass, mouth sores and sore throat.   Eyes:  Negative for eye problems and icterus.  Respiratory:  Negative for chest tightness, cough and shortness of breath.   Cardiovascular:  Negative for chest pain, leg swelling and palpitations.  Gastrointestinal:  Negative for abdominal distention, abdominal pain, constipation, diarrhea, nausea and vomiting.  Endocrine: Negative for hot flashes.  Genitourinary:  Negative for difficulty urinating.   Musculoskeletal:  Negative for arthralgias.  Skin:  Negative  for itching and rash.  Neurological:  Negative for dizziness, extremity weakness, headaches and numbness.  Hematological:  Negative for adenopathy. Does not bruise/bleed easily.  Psychiatric/Behavioral:  Negative for depression. The patient is not nervous/anxious.       PHYSICAL EXAMINATION   Onc Performance Status - 12/08/22 1109       ECOG Perf Status   ECOG Perf Status Fully active, able to carry on all pre-disease performance without restriction      KPS SCALE   KPS % SCORE Cares for self, unable to carry on normal activity or to do active work             Vitals:   12/08/22 1058  BP: 138/64  Pulse: 84  Resp: 15  Temp: 97.7 F (36.5 C)  SpO2: 93%    Physical Exam Constitutional:      General: He is not in acute distress.    Appearance: Normal appearance. He is not toxic-appearing.  HENT:     Head: Normocephalic and atraumatic.  Mouth/Throat:     Mouth: Mucous membranes are moist.     Pharynx: Oropharynx is clear. No oropharyngeal exudate or posterior oropharyngeal erythema.  Eyes:     General: No scleral icterus. Cardiovascular:     Rate and Rhythm: Normal rate and regular rhythm.     Pulses: Normal pulses.     Heart sounds: Normal heart sounds.  Pulmonary:     Effort: Pulmonary effort is normal.     Breath sounds: Normal breath sounds.  Abdominal:     General: Abdomen is flat. Bowel sounds are normal. There is no distension.     Palpations: Abdomen is soft.     Tenderness: There is no abdominal tenderness.  Musculoskeletal:        General: No swelling.     Cervical back: Neck supple.  Lymphadenopathy:     Cervical: No cervical adenopathy.  Skin:    General: Skin is warm and dry.     Findings: No rash.  Neurological:     General: No focal deficit present.     Mental Status: He is alert.  Psychiatric:        Mood and Affect: Mood normal.        Behavior: Behavior normal.        ASSESSMENT and THERAPY PLAN:   Malignant neoplasm of  supraglottis (HCC) Julian Brown is an 89 year old man with stage III supraglottic cancer here today for follow-up and evaluation.  Stage III supraglottic cancer: He has no clinical signs of cancer recurrence.  His most recent laryngoscopy with Dr. Gordy Levan occurred 3 months ago and verified this. Dysphagia: This has continued to improve.  He continues to do his exercises as recommended. Weight loss and nutrition: We reviewed that 160 to 165 pounds is a good weight for him.  I also suggested that he do chair exercises to strengthen his legs.  He has seen our nutrition team and supplements with Ensure which he will continue to do.  His daughter who is with him today is certain that his weight loss is associated with the dysphagia he experienced that is now improved.  I recommended that he continue to see his dentist every 3 months as he has been, his primary care visits which is scheduled in 1 week, along with Dr. Gordy Levan as scheduled.  He will return in 1 year for continued long-term follow-up.  He and his family know to call for any questions or concerns that may arise between now and his next appointment.    All questions were answered. The patient knows to call the clinic with any problems, questions or concerns. We can certainly see the patient much sooner if necessary.  Total encounter time:40 minutes*in face-to-face visit time, chart review, lab review, care coordination, order entry, and documentation of the encounter time.  Lillard Anes, NP 12/08/22 11:46 AM Medical Oncology and Hematology [facility] [address] [place], [place] [address] Tel. [phone]    Fax. [phone]  *Total Encounter Time as defined by the Centers for Medicare and Medicaid Services includes, in addition to the face-to-face time of a patient visit (documented in the note above) non-face-to-face time: obtaining and reviewing outside history, ordering and reviewing medications, tests or procedures,  care coordination (communications with other health care professionals or caregivers) and documentation in the medical record.

## 2022-12-08 NOTE — Assessment & Plan Note (Signed)
Julian Brown is an 89 year old man with stage III supraglottic cancer here today for follow-up and evaluation.  Stage III supraglottic cancer: He has no clinical signs of cancer recurrence.  His most recent laryngoscopy with Dr. Gordy Levan occurred 3 months ago and verified this. Dysphagia: This has continued to improve.  He continues to do his exercises as recommended. Weight loss and nutrition: We reviewed that 160 to 165 pounds is a good weight for him.  I also suggested that he do chair exercises to strengthen his legs.  He has seen our nutrition team and supplements with Ensure which he will continue to do.  His daughter who is with him today is certain that his weight loss is associated with the dysphagia he experienced that is now improved.  I recommended that he continue to see his dentist every 3 months as he has been, his primary care visits which is scheduled in 1 week, along with Dr. Gordy Levan as scheduled.  He will return in 1 year for continued long-term follow-up.  He and his family know to call for any questions or concerns that may arise between now and his next appointment.

## 2023-01-05 ENCOUNTER — Ambulatory Visit (INDEPENDENT_AMBULATORY_CARE_PROVIDER_SITE_OTHER): Payer: Medicare Other | Admitting: Orthopaedic Surgery

## 2023-01-05 DIAGNOSIS — M25561 Pain in right knee: Secondary | ICD-10-CM | POA: Diagnosis not present

## 2023-01-05 DIAGNOSIS — M25562 Pain in left knee: Secondary | ICD-10-CM

## 2023-01-05 DIAGNOSIS — G8929 Other chronic pain: Secondary | ICD-10-CM | POA: Diagnosis not present

## 2023-01-05 MED ORDER — LIDOCAINE HCL 1 % IJ SOLN
3.0000 mL | INTRAMUSCULAR | Status: AC | PRN
Start: 2023-01-05 — End: 2023-01-05
  Administered 2023-01-05: 3 mL

## 2023-01-05 MED ORDER — METHYLPREDNISOLONE ACETATE 40 MG/ML IJ SUSP
40.0000 mg | INTRAMUSCULAR | Status: AC | PRN
Start: 2023-01-05 — End: 2023-01-05
  Administered 2023-01-05: 40 mg via INTRA_ARTICULAR

## 2023-01-05 NOTE — Progress Notes (Signed)
The patient comes in today requesting bilateral knee steroid injections.  His daughter and wife are with him.  He has had steroid injections in the past but it has been a long period of time.  He is 89 years old.  He has some difficulty getting up from a seated position but overall he is doing well.  He does not walk with an assistive device.  Examination of both knees shows a slight flexion contracture of both knees.  He lacks full extension by at least 5 degrees.  We can flex him past 90 degrees.  Both knees are ligamentously stable with a mild effusion.  Previous x-rays show arthritis of both knees.  We did place a steroid injection per the request in both knees today which he tolerated well.  I did give them a prescription for outpatient physical therapy to consider for just generalized conditioning and quad strengthening with designing a home exercise program for him.  All questions and concerns were answered and addressed.  Follow-up can be as needed.    Procedure Note  Patient: Julian Brown             Date of Birth: 09-Jul-1932           MRN: [id number]             Visit Date: 01/05/2023  Procedures: Visit Diagnoses:  1. Chronic pain of left knee   2. Chronic pain of right knee     Large Joint Inj: R knee on 01/05/2023 1:50 PM Indications: diagnostic evaluation and pain Details: 22 G 1.5 in needle, superolateral approach  Arthrogram: No  Medications: 3 mL lidocaine 1 %; 40 mg methylPREDNISolone acetate 40 MG/ML Outcome: tolerated well, no immediate complications Procedure, treatment alternatives, risks and benefits explained, specific risks discussed. Consent was given by the patient. Immediately prior to procedure a time out was called to verify the correct patient, procedure, equipment, support staff and site/side marked as required. Patient was prepped and draped in the usual sterile fashion.    Large Joint Inj: L knee on 01/05/2023 1:50 PM Indications: diagnostic  evaluation and pain Details: 22 G 1.5 in needle, superolateral approach  Arthrogram: No  Medications: 3 mL lidocaine 1 %; 40 mg methylPREDNISolone acetate 40 MG/ML Outcome: tolerated well, no immediate complications Procedure, treatment alternatives, risks and benefits explained, specific risks discussed. Consent was given by the patient. Immediately prior to procedure a time out was called to verify the correct patient, procedure, equipment, support staff and site/side marked as required. Patient was prepped and draped in the usual sterile fashion.

## 2023-02-23 NOTE — Therapy (Signed)
[facility] [address], [address] [place], [place], [address] Phone: [phone]   Fax:  [phone]  Patient Details  Name: Julian Brown MRN: [id number] Date of Birth: December 30, 1932 Referring Provider:  No ref. provider found  Encounter Date: 02/23/2023  SPEECH THERAPY DISCHARGE SUMMARY  Visits from Start of Care: 6  Current functional level related to goals / functional outcomes: Pt had MBS at [facility] on 12/01/22. Results were as follows: At the request of Dr. Hezzie Bump, a Modified Barium Swallow (MBS) was conducted on December 01, 2022. _____  Treatment Diagnosis: Dysphagia, oropharyngeal phase  Impression:  Patient presents with mild-moderate oropharyngeal dysphagia related to h/o HNC and associated treatment. Dysphagia is present but patient is seemingly compensating with out additional incidence of material sticking/hanging in throat as pill prior and without report of PNA. Patient and family feel his swallow has subjectively improved since last seen with improved nutrition and strengthening via swallowing treatment. Family asks if patient can reduce exercise frequency to 1x day, which seems reasonable at this time for a swallow maintenance program. We will plan to touch base in October, in coordination with ENT as previously scheduled.   Recommendations: Diet: Soft & Bite Sized with Thin Liquids Aspiration precautions:  sit upright during all PO (as close to 90 degrees as possible) take small bites/sips alternate bites/sips additional swallows as needed cough/clear throat intermittently during the meal Medications: crushed, if permissible, with recommended consistencies  Additional Recommendations:  Daily, good oral care Home swallow exercises - maintenance, 1x daily Return appointment previously scheduled for October in coordination with ENT    Goals from last scheduled therapy session on 10/20/22 follow: SHORT TERM  GOALS: Target date: 11/01/22   Pt will tell SLP 3 overt s/sx aspiration PNA with modified independence in 2 sessions Baseline: 10/20/22 Goal status: Partially met   2.  Pt will complete HEP for dysphagia with usual min A in 2 sessions Baseline:  Goal status: not met - consistent min A   3.  Pt will follow safe swallow precautions from FEES on 09/20/22 in 3 sessions, with written cues and rare min A Baseline:  Goal status: Not met     LONG TERM GOALS: Target date: 12/08/22   Pt will improve EAT-10 score compared to initial administration Baseline:  Goal status: INITIAL   2.  Pt will complete HEP for dysphagia with usual min A in 3 sessions Baseline:  Goal status: Modified   3.  Pt will follow safe swallow precautions from FEES on 09/20/22 in 3 sessions, with modified independence Baseline:  Goal status: INITIAL Remaining deficits: See MBS report above. According to that report, pt will f/u with [facility] in October. This SLP will sign off at this time, with pt being followed by ST at [facility].   Education / Equipment: See therapy notes.    Patient agrees to discharge. Patient goals were partially met. Patient is being discharged due to not returning since the last visit.Marland Kitchen    Almon Whitford, CCC-SLP 02/23/2023, 10:34 AM  [facility] [address] [place], [place], [address] Phone: [phone]   Fax:  [phone]

## 2023-03-10 ENCOUNTER — Encounter: Payer: Medicare Other | Admitting: Adult Health

## 2023-04-24 ENCOUNTER — Ambulatory Visit (INDEPENDENT_AMBULATORY_CARE_PROVIDER_SITE_OTHER): Payer: Medicare Other | Admitting: Orthopaedic Surgery

## 2023-04-24 DIAGNOSIS — M1712 Unilateral primary osteoarthritis, left knee: Secondary | ICD-10-CM | POA: Diagnosis not present

## 2023-04-24 DIAGNOSIS — G8929 Other chronic pain: Secondary | ICD-10-CM

## 2023-04-24 DIAGNOSIS — M25561 Pain in right knee: Secondary | ICD-10-CM

## 2023-04-24 DIAGNOSIS — M25562 Pain in left knee: Secondary | ICD-10-CM

## 2023-04-24 DIAGNOSIS — M1711 Unilateral primary osteoarthritis, right knee: Secondary | ICD-10-CM

## 2023-04-24 MED ORDER — METHYLPREDNISOLONE ACETATE 40 MG/ML IJ SUSP
40.0000 mg | INTRAMUSCULAR | Status: AC | PRN
  Administered 2023-04-24: 40 mg via INTRA_ARTICULAR

## 2023-04-24 MED ORDER — LIDOCAINE HCL 1 % IJ SOLN
3.0000 mL | INTRAMUSCULAR | Status: AC | PRN
Start: 2023-04-24 — End: 2023-04-24
  Administered 2023-04-24: 3 mL

## 2023-04-24 MED ORDER — LIDOCAINE HCL 1 % IJ SOLN
3.0000 mL | INTRAMUSCULAR | Status: AC | PRN
  Administered 2023-04-24: 3 mL

## 2023-04-24 NOTE — Progress Notes (Signed)
The patient is well-known to [place].  He has debilitating arthritis in both his knees and chronic bilateral knee pain.  He comes in about every 3 months or so for steroid injections in both his knees.  His family is with him today.  He is requesting steroid injections today.  It has been since the end of July when he had injections last.  He has had no acute changes in medical status.  He has multiple comorbidities and is not a surgical candidate.  Examination of both knees shows slight flexion contracture both knees with varus malalignment and significant pain throughout flexion extension of both knees.  I did place a steroid injection in both knees today per his request.  He knows that we can do this again in 3 months.  All question concerns were answered and addressed.    Procedure Note  Patient: Julian Brown             Date of Birth: [birth year]           MRN: [id number]             Visit Date: 04/24/2023  Procedures: Visit Diagnoses:  1. Chronic pain of left knee   2. Chronic pain of right knee   3. Primary osteoarthritis of right knee   4. Unilateral primary osteoarthritis, left knee     Large Joint Inj: R knee on 04/24/2023 1:50 PM Indications: diagnostic evaluation and pain Details: 22 G 1.5 in needle, superolateral approach  Arthrogram: No  Medications: 3 mL lidocaine 1 %; 40 mg methylPREDNISolone acetate 40 MG/ML Outcome: tolerated well, no immediate complications Procedure, treatment alternatives, risks and benefits explained, specific risks discussed. Consent was given by the patient. Immediately prior to procedure a time out was called to verify the correct patient, procedure, equipment, support staff and site/side marked as required. Patient was prepped and draped in the usual sterile fashion.    Large Joint Inj: L knee on 04/24/2023 1:50 PM Indications: diagnostic evaluation and pain Details: 22 G 1.5 in needle, superolateral approach  Arthrogram: No  Medications:  3 mL lidocaine 1 %; 40 mg methylPREDNISolone acetate 40 MG/ML Outcome: tolerated well, no immediate complications Procedure, treatment alternatives, risks and benefits explained, specific risks discussed. Consent was given by the patient. Immediately prior to procedure a time out was called to verify the correct patient, procedure, equipment, support staff and site/side marked as required. Patient was prepped and draped in the usual sterile fashion.

## 2023-06-09 ENCOUNTER — Emergency Department (HOSPITAL_BASED_OUTPATIENT_CLINIC_OR_DEPARTMENT_OTHER): Payer: Medicare Other

## 2023-06-09 ENCOUNTER — Other Ambulatory Visit: Payer: Self-pay

## 2023-06-09 ENCOUNTER — Inpatient Hospital Stay (HOSPITAL_COMMUNITY): Payer: Medicare Other

## 2023-06-09 ENCOUNTER — Encounter (HOSPITAL_COMMUNITY): Payer: Self-pay | Admitting: Cardiothoracic Surgery

## 2023-06-09 ENCOUNTER — Emergency Department (HOSPITAL_BASED_OUTPATIENT_CLINIC_OR_DEPARTMENT_OTHER): Payer: Medicare Other | Admitting: Radiology

## 2023-06-09 ENCOUNTER — Inpatient Hospital Stay (HOSPITAL_BASED_OUTPATIENT_CLINIC_OR_DEPARTMENT_OTHER)
Admission: EM | Admit: 2023-06-09 | Discharge: 2023-06-13 | DRG: 280 | Disposition: A | Discharge status: Home Health | Payer: Medicare Other | Attending: Internal Medicine | Admitting: Internal Medicine

## 2023-06-09 DIAGNOSIS — E785 Hyperlipidemia, unspecified: Secondary | ICD-10-CM | POA: Diagnosis present

## 2023-06-09 DIAGNOSIS — Z8249 Family history of ischemic heart disease and other diseases of the circulatory system: Secondary | ICD-10-CM

## 2023-06-09 DIAGNOSIS — I214 Non-ST elevation (NSTEMI) myocardial infarction: Secondary | ICD-10-CM | POA: Diagnosis present

## 2023-06-09 DIAGNOSIS — Z79899 Other long term (current) drug therapy: Secondary | ICD-10-CM | POA: Diagnosis not present

## 2023-06-09 DIAGNOSIS — Z7982 Long term (current) use of aspirin: Secondary | ICD-10-CM

## 2023-06-09 DIAGNOSIS — I249 Acute ischemic heart disease, unspecified: Secondary | ICD-10-CM | POA: Diagnosis present

## 2023-06-09 DIAGNOSIS — I428 Other cardiomyopathies: Secondary | ICD-10-CM | POA: Diagnosis present

## 2023-06-09 DIAGNOSIS — I21A1 Myocardial infarction type 2: Secondary | ICD-10-CM | POA: Diagnosis not present

## 2023-06-09 DIAGNOSIS — R042 Hemoptysis: Secondary | ICD-10-CM | POA: Diagnosis present

## 2023-06-09 DIAGNOSIS — I493 Ventricular premature depolarization: Secondary | ICD-10-CM | POA: Diagnosis present

## 2023-06-09 DIAGNOSIS — J449 Chronic obstructive pulmonary disease, unspecified: Secondary | ICD-10-CM | POA: Diagnosis present

## 2023-06-09 DIAGNOSIS — Z888 Allergy status to other drugs, medicaments and biological substances status: Secondary | ICD-10-CM

## 2023-06-09 DIAGNOSIS — I5021 Acute systolic (congestive) heart failure: Secondary | ICD-10-CM | POA: Diagnosis present

## 2023-06-09 DIAGNOSIS — R1310 Dysphagia, unspecified: Secondary | ICD-10-CM | POA: Diagnosis present

## 2023-06-09 DIAGNOSIS — I471 Supraventricular tachycardia, unspecified: Secondary | ICD-10-CM | POA: Diagnosis present

## 2023-06-09 DIAGNOSIS — I480 Paroxysmal atrial fibrillation: Secondary | ICD-10-CM | POA: Diagnosis present

## 2023-06-09 DIAGNOSIS — Z85038 Personal history of other malignant neoplasm of large intestine: Secondary | ICD-10-CM

## 2023-06-09 DIAGNOSIS — I110 Hypertensive heart disease with heart failure: Secondary | ICD-10-CM | POA: Diagnosis present

## 2023-06-09 DIAGNOSIS — M10032 Idiopathic gout, left wrist: Secondary | ICD-10-CM | POA: Diagnosis present

## 2023-06-09 DIAGNOSIS — Z8546 Personal history of malignant neoplasm of prostate: Secondary | ICD-10-CM | POA: Diagnosis not present

## 2023-06-09 DIAGNOSIS — I10 Essential (primary) hypertension: Secondary | ICD-10-CM | POA: Diagnosis present

## 2023-06-09 DIAGNOSIS — R7989 Other specified abnormal findings of blood chemistry: Secondary | ICD-10-CM | POA: Diagnosis not present

## 2023-06-09 DIAGNOSIS — I4891 Unspecified atrial fibrillation: Secondary | ICD-10-CM

## 2023-06-09 DIAGNOSIS — C321 Malignant neoplasm of supraglottis: Secondary | ICD-10-CM | POA: Diagnosis not present

## 2023-06-09 DIAGNOSIS — M109 Gout, unspecified: Secondary | ICD-10-CM | POA: Diagnosis present

## 2023-06-09 DIAGNOSIS — Z9981 Dependence on supplemental oxygen: Secondary | ICD-10-CM

## 2023-06-09 DIAGNOSIS — Z880 Allergy status to penicillin: Secondary | ICD-10-CM | POA: Diagnosis not present

## 2023-06-09 DIAGNOSIS — Z7951 Long term (current) use of inhaled steroids: Secondary | ICD-10-CM

## 2023-06-09 DIAGNOSIS — I272 Pulmonary hypertension, unspecified: Secondary | ICD-10-CM | POA: Diagnosis present

## 2023-06-09 DIAGNOSIS — I491 Atrial premature depolarization: Secondary | ICD-10-CM | POA: Diagnosis not present

## 2023-06-09 DIAGNOSIS — I4892 Unspecified atrial flutter: Secondary | ICD-10-CM | POA: Diagnosis present

## 2023-06-09 DIAGNOSIS — Z923 Personal history of irradiation: Secondary | ICD-10-CM

## 2023-06-09 DIAGNOSIS — Z87891 Personal history of nicotine dependence: Secondary | ICD-10-CM | POA: Diagnosis not present

## 2023-06-09 DIAGNOSIS — Z8501 Personal history of malignant neoplasm of esophagus: Secondary | ICD-10-CM

## 2023-06-09 DIAGNOSIS — I2510 Atherosclerotic heart disease of native coronary artery without angina pectoris: Secondary | ICD-10-CM | POA: Diagnosis present

## 2023-06-09 LAB — ECHOCARDIOGRAM COMPLETE
AR max vel: 2.47 cm2
AV Peak grad: 7.8 mm[Hg]
Ao pk vel: 1.4 m/s
Area-P 1/2: 2.62 cm2
Calc EF: 51.6 %
Height: 69 in
MV M vel: 2.67 m/s
MV Peak grad: 28.4 mm[Hg]
S' Lateral: 4.4 cm
Single Plane A2C EF: 50.2 %
Single Plane A4C EF: 52.1 %
Weight: 2504 [oz_av]

## 2023-06-09 LAB — LIPID PANEL
Cholesterol: 161 mg/dL (ref 0–200)
HDL: 43 mg/dL (ref >40)
LDL Cholesterol: 107 mg/dL — ABNORMAL HIGH (ref 0–99)
Total CHOL/HDL Ratio: 3.7 {ratio}
Triglycerides: 54 mg/dL (ref <150)
VLDL: 11 mg/dL (ref 0–40)

## 2023-06-09 LAB — CBC
HCT: 44.8 % (ref 39.0–52.0)
Hemoglobin: 14.8 g/dL (ref 13.0–17.0)
MCH: 33 pg (ref 26.0–34.0)
MCHC: 33 g/dL (ref 30.0–36.0)
MCV: 99.8 fL (ref 80.0–100.0)
Platelets: 201 10*3/uL (ref 150–400)
RBC: 4.49 MIL/uL (ref 4.22–5.81)
RDW: 13.2 % (ref 11.5–15.5)
WBC: 8 10*3/uL (ref 4.0–10.5)
nRBC: 0 % (ref 0.0–0.2)

## 2023-06-09 LAB — TROPONIN I (HIGH SENSITIVITY)
Troponin I (High Sensitivity): 1868 ng/L (ref <18)
Troponin I (High Sensitivity): 2131 ng/L (ref <18)

## 2023-06-09 LAB — BASIC METABOLIC PANEL
Anion gap: 7 (ref 5–15)
BUN: 17 mg/dL (ref 8–23)
CO2: 31 mmol/L (ref 22–32)
Calcium: 9.8 mg/dL (ref 8.9–10.3)
Chloride: 100 mmol/L (ref 98–111)
Creatinine, Ser: 0.85 mg/dL (ref 0.61–1.24)
GFR, Estimated: >60 mL/min (ref >60)
Glucose, Bld: 108 mg/dL — ABNORMAL HIGH (ref 70–99)
Potassium: 4 mmol/L (ref 3.5–5.1)
Sodium: 138 mmol/L (ref 135–145)

## 2023-06-09 LAB — BRAIN NATRIURETIC PEPTIDE: B Natriuretic Peptide: 590 pg/mL — ABNORMAL HIGH (ref 0.0–100.0)

## 2023-06-09 LAB — HEPARIN LEVEL (UNFRACTIONATED)
Heparin Unfractionated: 0.23 [IU]/mL — ABNORMAL LOW (ref 0.30–0.70)
Heparin Unfractionated: 0.16 [IU]/mL — ABNORMAL LOW (ref 0.30–0.70)

## 2023-06-09 LAB — TSH: TSH: 1.496 u[IU]/mL (ref 0.350–4.500)

## 2023-06-09 LAB — MAGNESIUM: Magnesium: 2 mg/dL (ref 1.7–2.4)

## 2023-06-09 MED ORDER — ONDANSETRON HCL 4 MG/2ML IJ SOLN: 4.0000 mg | Freq: Four times a day (QID) | INTRAMUSCULAR | Status: DC | PRN

## 2023-06-09 MED ORDER — ASPIRIN 81 MG PO TBEC
81.0000 mg | DELAYED_RELEASE_TABLET | Freq: Every day | ORAL | Status: DC
  Administered 2023-06-10 – 2023-06-11 (×2): 81 mg via ORAL
  Filled 2023-06-09 (×5): qty 1

## 2023-06-09 MED ORDER — ASPIRIN 81 MG PO CHEW
324.0000 mg | CHEWABLE_TABLET | Freq: Once | ORAL | Status: AC
  Administered 2023-06-09: 324 mg via ORAL
  Filled 2023-06-09: qty 4

## 2023-06-09 MED ORDER — ACETAMINOPHEN 325 MG PO TABS
650.0000 mg | ORAL_TABLET | ORAL | Status: DC | PRN
  Administered 2023-06-11: 650 mg via ORAL
  Filled 2023-06-09: qty 2

## 2023-06-09 MED ORDER — METOPROLOL TARTRATE 12.5 MG HALF TABLET
12.5000 mg | ORAL_TABLET | Freq: Once | ORAL | Status: AC
  Administered 2023-06-09: 12.5 mg via ORAL
  Filled 2023-06-09: qty 1

## 2023-06-09 MED ORDER — IOHEXOL 350 MG/ML SOLN
100.0000 mL | Freq: Once | INTRAVENOUS | Status: AC | PRN
  Administered 2023-06-09: 75 mL via INTRAVENOUS

## 2023-06-09 MED ORDER — NITROGLYCERIN 0.4 MG SL SUBL: 0.4000 mg | SUBLINGUAL_TABLET | SUBLINGUAL | Status: DC | PRN

## 2023-06-09 MED ORDER — FUROSEMIDE 10 MG/ML IJ SOLN
10.0000 mg | Freq: Once | INTRAMUSCULAR | Status: AC
  Administered 2023-06-09: 10 mg via INTRAVENOUS
  Filled 2023-06-09: qty 2

## 2023-06-09 MED ORDER — ROSUVASTATIN CALCIUM 5 MG PO TABS
10.0000 mg | ORAL_TABLET | Freq: Every day | ORAL | Status: DC
Start: 2023-06-09 — End: 2023-06-13
  Administered 2023-06-09 – 2023-06-13 (×5): 10 mg via ORAL
  Filled 2023-06-09 (×5): qty 2

## 2023-06-09 MED ORDER — METOPROLOL TARTRATE 5 MG/5ML IV SOLN
5.0000 mg | Freq: Once | INTRAVENOUS | Status: AC
  Administered 2023-06-09: 5 mg via INTRAVENOUS
  Filled 2023-06-09: qty 5

## 2023-06-09 MED ORDER — METOPROLOL TARTRATE 5 MG/5ML IV SOLN: 5.0000 mg | Freq: Once | INTRAVENOUS | Status: DC

## 2023-06-09 MED ORDER — HEPARIN BOLUS VIA INFUSION
4000.0000 [IU] | Freq: Once | INTRAVENOUS | Status: AC
  Administered 2023-06-09: 4000 [IU] via INTRAVENOUS

## 2023-06-09 MED ORDER — HEPARIN (PORCINE) 25000 UT/250ML-% IV SOLN
850.0000 [IU]/h | INTRAVENOUS | Status: DC
Start: 2023-06-09 — End: 2023-06-12
  Administered 2023-06-09 (×2): 850 [IU]/h via INTRAVENOUS
  Administered 2023-06-11 (×2): 1150 [IU]/h via INTRAVENOUS
  Filled 2023-06-09 (×4): qty 250

## 2023-06-09 MED ORDER — METOPROLOL TARTRATE 5 MG/5ML IV SOLN
5.0000 mg | INTRAVENOUS | Status: DC | PRN
  Filled 2023-06-09: qty 5

## 2023-06-09 MED ORDER — METOPROLOL SUCCINATE ER 25 MG PO TB24
25.0000 mg | ORAL_TABLET | Freq: Every day | ORAL | Status: DC
  Administered 2023-06-09: 25 mg via ORAL
  Filled 2023-06-09 (×2): qty 1

## 2023-06-09 NOTE — Progress Notes (Signed)
PHARMACY - ANTICOAGULATION CONSULT NOTE  Pharmacy Consult for Heparin Indication: chest pain/ACS  Allergies  Allergen Reactions   Fenofibrate     Other reaction(s): muscle aches   Other Other (See Comments)   Amoxicillin-Pot Clavulanate Rash   Penicillins Swelling    Has patient had a PCN reaction causing immediate rash, facial/tongue/throat swelling, SOB or lightheadedness with hypotension: no (lip swelling "as soon as the pill touched my mouth so I didn't swallow it") Has patient had a PCN reaction causing severe rash involving mucus membranes or skin necrosis: no Has patient had a PCN reaction that required hospitalization: no Has patient had a PCN reaction occurring within the last 10 years: no If all of the above answers are "NO", then may proceed with Ceph    Patient Measurements: Height: 5\' 9"  (175.3 cm) Weight: 71.7 kg (158 lb) IBW/kg (Calculated) : 70.7  Vital Signs: Temp: 98.3 F (36.8 C) (12/27 0008) BP: 148/90 (12/27 0056) Pulse Rate: 138 (12/27 0056)  Labs: Recent Labs    06/09/23 0017  HGB 14.8  HCT 44.8  PLT 201  CREATININE 0.85  TROPONINIHS 1,868*    Estimated Creatinine Clearance: 60.1 mL/min (by C-G formula based on SCr of 0.85 mg/dL).   Medical History: Past Medical History:  Diagnosis Date   Acute gangrenous cholecystitis s/p lap cholecystectomy 06/08/2017 06/08/2017   Arthritis    Colon cancer [facility])    ED (erectile dysfunction)    Emphysema of lung (HCC)    Hyperlipemia    Hypertension    Prostate cancer (HCC)    prostate cancer-tx radiation   Wears glasses     Medications:  No current facility-administered medications on file prior to encounter.   Current Outpatient Medications on File Prior to Encounter  Medication Sig Dispense Refill   amLODipine (NORVASC) 5 MG tablet 1 tablet Orally Once a day for blood pressure for 90 days     Cholecalciferol 50 MCG (2000 UT) TABS Take by mouth.     Tiotropium Bromide Monohydrate (SPIRIVA  RESPIMAT) 1.25 MCG/ACT AERS Inhale 2 puffs into the lungs daily. 4 g 5    Assessment: 89 y.o. male with chest pain for heparin Goal of Therapy:  Heparin level 0.3-0.7 units/ml Monitor platelets by anticoagulation protocol: Yes   Plan:  Heparin 4000 units IV bolus, then start heparin 850 units/hr Check heparin level in 8 hours.   Landy Mace, Gary Fleet 06/09/2023,1:16 AM

## 2023-06-09 NOTE — ED Provider Notes (Signed)
[place] [facility] DE[facility]MENT AT [facility] Provider Note   CSN: [id number] Arrival date & time: 06/09/23  0003     History  Chief Complaint  Patient presents with   Chest Pain    YUVAAN KAUK is a 89 y.o. male.  Patient is an 89 year old male with past medical history of COPD, malignant neoplasm on of the supraglottis, hypertension, hyperlipidemia.  Patient presenting today for evaluation of rapid heart rate.  He has been taking his blood pressure throughout the day and has been getting normal blood pressures, but his heart rate has been reported to be in the 130s and 140s consistently.  They called the family doctor who advised him to come to the ER to be evaluated.  Patient denies fluttering or palpitations, but does describe some chest tightness this evening.  He has no prior cardiac history.  The history is provided by the patient.       Home Medications Prior to Admission medications   Medication Sig Start Date End Date Taking? Authorizing Provider  amLODipine (NORVASC) 5 MG tablet 1 tablet Orally Once a day for blood pressure for 90 days 03/11/22   [provider]  Cholecalciferol 50 MCG (2000 UT) TABS Take by mouth. 09/18/17   [provider]  Tiotropium Bromide Monohydrate (SPIRIVA RESPIMAT) 1.25 MCG/ACT AERS Inhale 2 puffs into the lungs daily. 05/31/22   Kalman Shan, MD      Allergies    Fenofibrate, Other, Amoxicillin-pot clavulanate, and Penicillins    Review of Systems   Review of Systems  All other systems reviewed and are negative.   Physical Exam Updated Vital Signs BP (!) 148/90 (BP Location: Right Arm)   Pulse (!) 138   Temp 98.3 F (36.8 C)   Resp 14   Ht 5\' 9"  (1.753 m)   Wt 71.7 kg   SpO2 98%   BMI 23.33 kg/m  Physical Exam Vitals and nursing note reviewed.  Constitutional:      General: He is not in acute distress.    Appearance: He is well-developed. He is not diaphoretic.  HENT:     Head:  Normocephalic and atraumatic.  Cardiovascular:     Rate and Rhythm: Regular rhythm. Tachycardia present.     Heart sounds: No murmur heard.    No friction rub.  Pulmonary:     Effort: Pulmonary effort is normal. No respiratory distress.     Breath sounds: Normal breath sounds. No wheezing or rales.  Abdominal:     General: Bowel sounds are normal. There is no distension.     Palpations: Abdomen is soft.     Tenderness: There is no abdominal tenderness.  Musculoskeletal:        General: Normal range of motion.     Cervical back: Normal range of motion and neck supple.  Skin:    General: Skin is warm and dry.  Neurological:     Mental Status: He is alert and oriented to person, place, and time.     Coordination: Coordination normal.     ED Results / Procedures / Treatments   Labs (all labs ordered are listed, but only abnormal results are displayed) Labs Reviewed  BASIC METABOLIC PANEL - Abnormal; Notable for the following components:      Result Value   Glucose, Bld 108 (*)    All other components within normal limits  CBC  TROPONIN I (HIGH SENSITIVITY)    EKG EKG Interpretation Date/Time:  Friday June 09 2023 00:16:37  EST Ventricular Rate:  146 PR Interval:    QRS Duration:  66 QT Interval:  304 QTC Calculation: 473 R Axis:   53  Text Interpretation: Supraventricular tachycardia with occasional Premature ventricular complexes Nonspecific ST and T wave abnormality Abnormal ECG When compared with ECG of 12-Jun-2017 11:11, Premature ventricular complexes are now Present Vent. rate has increased BY  72 BPM Non-specific change in ST segment in Inferior leads ST now depressed in Lateral leads Confirmed by Geoffery Lyons ([id number]) on 06/09/2023 12:25:48 AM  Radiology DG Chest Port 1 View Result Date: 06/09/2023 CLINICAL DATA:  Chest pain. EXAM: PORTABLE CHEST 1 VIEW COMPARISON:  June 15, 2017 FINDINGS: The cardiac silhouette is mildly enlarged and unchanged in size.  Mild, diffuse, chronic appearing increased lung markings are noted. Very mild atelectasis is seen within the bilateral lung bases. There is no evidence of focal consolidation, pleural effusion or pneumothorax. Multilevel degenerative changes seen throughout the thoracic spine. IMPRESSION: Mild cardiomegaly with very mild bibasilar atelectasis. Electronically Signed   By: Aram Candela M.D.   On: 06/09/2023 01:00    Procedures Procedures    Medications Ordered in ED Medications  metoprolol tartrate (LOPRESSOR) injection 5 mg (has no administration in time range)    ED Course/ Medical Decision Making/ A&P  Patient is an 89 year old male presenting with complaints of elevated heart rate and shortness of breath as described in the HPI.  Patient arrives here tachycardic with heart rate in the 140s, but vital signs otherwise stable with no fever or hypoxia.  Physical examination otherwise unremarkable.  Workup initiated including CBC, CMP, and troponin.  Laboratory studies unremarkable with the exception of initial troponin of 1800.  Repeat troponin is 2100.  Chest x-ray obtained showing no acute process.  Patient also underwent a CTA of the chest to rule out pulmonary embolism as a cause of his tachycardia and elevated cardiac enzymes.  This was negative for PE and did not show alternate pathology that would explain his issues.  Patient has been started on a heparin drip and given 4 baby aspirin.  Patient also given 5 mg of IV Lopressor which slowed his rate slightly, and underlying rhythm appeared to be atrial flutter.  Shortly after receiving the Lopressor, rhythm did change to what was in obvious atrial fibrillation, then ultimately return to sinus rhythm with frequent PACs and PVCs.  Care was discussed with Dr. Regino Schultze from cardiology who agrees to accept the patient in transfer.  Because of his cardiac enzyme elevation possibly related to demand, but seems elevated to a higher degree than I  would expect if this was the case.  Patient to undergo further evaluation by cardiology.  CRITICAL CARE Performed by: Geoffery Lyons Total critical care time: 40 minutes Critical care time was exclusive of separately billable procedures and treating other patients. Critical care was necessary to treat or prevent imminent or life-threatening deterioration. Critical care was time spent personally by me on the following activities: development of treatment plan with patient and/or surrogate as well as nursing, discussions with consultants, evaluation of patient's response to treatment, examination of patient, obtaining history from patient or surrogate, ordering and performing treatments and interventions, ordering and review of laboratory studies, ordering and review of radiographic studies, pulse oximetry and re-evaluation of patient's condition.   Final Clinical Impression(s) / ED Diagnoses Final diagnoses:  None    Rx / DC Orders ED Discharge Orders     None         Kayman Snuffer, Riley Lam,  MD 06/09/23 1610

## 2023-06-09 NOTE — ED Notes (Signed)
Assisted patient with urinal 350 ml UOP noted. Immediately after, assisted patient to bed side commode for BM. Small formed BM noted. Assisted with personal hygiene and back into bed. Patient awake and alert. Converses easily. Family at bedside.   Called lab to call for courier so that I can draw the heparin level.

## 2023-06-09 NOTE — ED Notes (Signed)
[facility] here to transport patient to [facility]. Lab courier has not arrived to facility. I did not draw the heparin level.

## 2023-06-09 NOTE — H&P (Addendum)
Cardiology Admission History and Physical   Patient ID: TREYTEN KITCHIN MRN: [id number]; DOB: November 27, 1932   Admission date: 06/09/2023  PCP:  [name], MD   [facility] Providers Cardiologist:  None   {  Chief Complaint: Elevated troponins, tachyarrhythmia  Patient Profile:   Julian Brown is a 89 y.o. male with COPD, malignant neoplasm of the supraglottis status, dysphagia post partial mass removal as well as radiation, hypertension, hyperlipidemia intolerant to (rosuvastatin, simvastatin), prostate cancer who is being seen 06/09/2023 for the evaluation of elevated troponins.  History of Present Illness:   Mr. Grayer has no prior cardiac history.  Reports mother who had a heart attack of unknown age.  Not a known diabetic.  Used to be a smoker with over 20 years pack-year history however has quit over 40 years ago but has COPD.  No drugs, rarely drinks.  Reportedly had a normal nuclear stress test in 2017.  Currently patient being evaluated for elevated troponins.  At home on his blood pressure cuff he was reported to have had elevated heart rates in the 130s, family doctor was called and recommended visit to the ED.  Reportedly in the ED he had EKG and is difficult to discern what kind of SVT it could be due to wandering baseline.  He was given 5 mg of IV Lopressor which reportedly showed atrial flutter and reportedly fibrillation but spontaneously converted back to NSR with frequent PACs and PVCs.  He had troponins drawn that were elevated at 1800+ then 2100+.  Negative CTA for PE.  He was started on IV heparin and loaded with aspirin.  Overall patient is asymptomatic and very functional at home and reports walking frequently without any complaints of chest pain, shortness of breath, peripheral edema, palpitations, dizziness, syncope, falls, orthopnea.  In the ER he complained of some chest tightness however in talking with him now he denies this.  Additionally,  although patient does respond to questions appropriately he is little bit slow to respond and seems confused at times.  He lives at home with his wife, reports no kids.  Patient has normal renal function.  Normal CBC.  New labs are ordered and pending.   Past Medical History:  Diagnosis Date   Acute gangrenous cholecystitis s/p lap cholecystectomy 06/08/2017 06/08/2017   Arthritis    Colon cancer [facility])    ED (erectile dysfunction)    Emphysema of lung (HCC)    Hyperlipemia    Hypertension    Prostate cancer (HCC)    prostate cancer-tx radiation   Wears glasses     Past Surgical History:  Procedure Laterality Date   APPENDECTOMY     CARPAL TUNNEL RELEASE  5/12   lt   CARPAL TUNNEL RELEASE  12/13/2011   Procedure: CARPAL TUNNEL RELEASE;  Surgeon: Wyn Forster., MD;  Location: [facility];  Service: Orthopedics;  Laterality: Right;  and inject right wrist   CHOLECYSTECTOMY N/A 06/08/2017   Procedure: LAPAROSCOPIC CHOLECYSTECTOMY WITH LYSIS OF ADHESIONS;  Surgeon: Ovidio Kin, MD;  Location: [facility];  Service: General;  Laterality: N/A;   COLON SURGERY  1997   hemicolectomy-rt-ca   COLONOSCOPY     about 12 inches of colon removed due to colon cancer   IR GASTROSTOMY TUBE MOD SED  11/06/2019   IR GASTROSTOMY TUBE REMOVAL  01/22/2020     Medications Prior to Admission: Prior to Admission medications   Medication Sig Start Date End Date Taking? Authorizing Provider  amLODipine (NORVASC) 5 MG tablet 1 tablet Orally Once a day for blood pressure for 90 days 03/11/22   [provider]  Cholecalciferol 50 MCG (2000 UT) TABS Take by mouth. 09/18/17   [provider]  Tiotropium Bromide Monohydrate (SPIRIVA RESPIMAT) 1.25 MCG/ACT AERS Inhale 2 puffs into the lungs daily. 05/31/22   Kalman Shan, MD     Allergies:    Allergies  Allergen Reactions   Fenofibrate     Other reaction(s): muscle aches   Other Other (See Comments)   Amoxicillin-Pot  Clavulanate Rash   Penicillins Swelling    Has patient had a PCN reaction causing immediate rash, facial/tongue/throat swelling, SOB or lightheadedness with hypotension: no (lip swelling "as soon as the pill touched my mouth so I didn't swallow it") Has patient had a PCN reaction causing severe rash involving mucus membranes or skin necrosis: no Has patient had a PCN reaction that required hospitalization: no Has patient had a PCN reaction occurring within the last 10 years: no If all of the above answers are "NO", then may proceed with Ceph    Social History:   Social History   Socioeconomic History   Marital status: Married    Spouse name: Not on file   Number of children: 2   Years of education: Not on file   Highest education level: Not on file  Occupational History   Not on file  Tobacco Use   Smoking status: Former    Current packs/day: 0.00    Average packs/day: 1 pack/day for 36.0 years (36.0 ttl pk-yrs)    Types: Cigarettes    Start date: 12/09/1950    Quit date: 12/09/1986    Years since quitting: 36.5   Smokeless tobacco: Never  Vaping Use   Vaping status: Never Used  Substance and Sexual Activity   Alcohol use: Yes    Comment: occ   Drug use: No   Sexual activity: Not Currently  Other Topics Concern   Not on file  Social History Narrative   Not on file   Social Drivers of Health   Financial Resource Strain: Not on file  Food Insecurity: No Food Insecurity (06/09/2023)   Hunger Vital Sign    Worried About Running Out of Food in the Last Year: Never true    Ran Out of Food in the Last Year: Never true  Transportation Needs: No Transportation Needs (06/09/2023)   PRAPARE - Administrator, Civil Service (Medical): No    Lack of Transportation (Non-Medical): No  Physical Activity: Not on file  Stress: Not on file  Social Connections: Not on file  Intimate Partner Violence: Not At Risk (06/09/2023)   Humiliation, Afraid, Rape, and Kick  questionnaire    Fear of Current or Ex-Partner: No    Emotionally Abused: No    Physically Abused: No    Sexually Abused: No    Family History:  The patient's family history is not on file.    ROS:  Please see the history of present illness. All other ROS reviewed and negative.     Physical Exam/Data:   Vitals:   06/09/23 1000 06/09/23 1011 06/09/23 1015 06/09/23 1124  BP: (!) 140/69  129/61 (!) 144/70  Pulse: 70  (!) 35   Resp: 17  19 18   Temp:  98.2 F (36.8 C)    TempSrc:  Oral    SpO2: 99%  97% 97%  Weight:    71 kg  Height:  5\' 9"  (1.753 m)    Intake/Output Summary (Last 24 hours) at 06/09/2023 1208 Last data filed at 06/09/2023 1127 Gross per 24 hour  Intake 121.54 ml  Output 351 ml  Net -229.46 ml      06/09/2023   11:24 AM 06/09/2023   12:09 AM 12/08/2022   10:58 AM  Last 3 Weights  Weight (lbs) 156 lb 8 oz 158 lb 159 lb 8 oz  Weight (kg) 70.988 kg 71.668 kg 72.349 kg     Body mass index is 23.11 kg/m.  General:  Well nourished, well developed, in no acute distress HEENT: normal Neck: no JVD Vascular: No carotid bruits; Distal pulses 2+ bilaterally   Cardiac:  normal S1, S2; RRR; ectopy noted Lungs: Diminished and distant breath sounds however clear Abd: soft, nontender, no hepatomegaly  Ext: no edema Musculoskeletal:  No deformities, BUE and BLE strength normal and equal Skin: warm and dry  Neuro:  CNs 2-12 intact, no focal abnormalities noted Psych:  Normal affect    EKG: Difficult to identify SVT, could be atrial flutter, heart rates in the 140s.  Minimal ST depressions in lateral leads with resolution on subsequent EKG.  Relevant CV Studies: NSR heart rates in the 60s and 70s with frequent PACs and PVCs.  Laboratory Data:  High Sensitivity Troponin:   Recent Labs  Lab 06/09/23 0017 06/09/23 0211  TROPONINIHS 1,868* 2,131*      Chemistry Recent Labs  Lab 06/09/23 0017  NA 138  K 4.0  CL 100  CO2 31  GLUCOSE 108*  BUN 17   CREATININE 0.85  CALCIUM 9.8  GFRNONAA >60  ANIONGAP 7    No results for input(s): "PROT", "ALBUMIN", "AST", "ALT", "ALKPHOS", "BILITOT" in the last 168 hours. Lipids No results for input(s): "CHOL", "TRIG", "HDL", "LABVLDL", "LDLCALC", "CHOLHDL" in the last 168 hours. Hematology Recent Labs  Lab 06/09/23 0017  WBC 8.0  RBC 4.49  HGB 14.8  HCT 44.8  MCV 99.8  MCH 33.0  MCHC 33.0  RDW 13.2  PLT 201   Thyroid No results for input(s): "TSH", "FREET4" in the last 168 hours. BNPNo results for input(s): "BNP", "PROBNP" in the last 168 hours.  DDimer No results for input(s): "DDIMER" in the last 168 hours.   Radiology/Studies:  CT Angio Chest PE W and/or Wo Contrast Result Date: 06/09/2023 CLINICAL DATA:  Pulmonary embolism (PE) suspected, high prob. Chest discomfort. EXAM: CT ANGIOGRAPHY CHEST WITH CONTRAST TECHNIQUE: Multidetector CT imaging of the chest was performed using the standard protocol during bolus administration of intravenous contrast. Multiplanar CT image reconstructions and MIPs were obtained to evaluate the vascular anatomy. RADIATION DOSE REDUCTION: This exam was performed according to the departmental dose-optimization program which includes automated exposure control, adjustment of the mA and/or kV according to patient size and/or use of iterative reconstruction technique. CONTRAST:  75mL OMNIPAQUE IOHEXOL 350 MG/ML SOLN COMPARISON:  06/01/2021 FINDINGS: Cardiovascular: No filling defects in the pulmonary arteries to suggest pulmonary emboli. Mild cardiomegaly. Aorta normal caliber. Moderate coronary artery and aortic atherosclerosis. Mediastinum/Nodes: No mediastinal, hilar, or axillary adenopathy. Trachea and esophagus are unremarkable. Thyroid unremarkable. Lungs/Pleura: Left base atelectasis. Right middle lobe atelectasis. No additional confluent opacities or effusions. Upper Abdomen: Numerous cysts throughout the liver are again noted, unchanged. These are compatible  with benign cysts. No acute findings. Musculoskeletal: Chest wall soft tissues are unremarkable. No acute bony abnormality. Review of the MIP images confirms the above findings. IMPRESSION: No evidence of pulmonary embolus. Mild cardiomegaly. Areas of  atelectasis in the right middle lobe and left lower lobe. Coronary artery disease. Aortic Atherosclerosis (ICD10-I70.0). Electronically Signed   By: Charlett Nose M.D.   On: 06/09/2023 02:29   DG Chest Port 1 View Result Date: 06/09/2023 CLINICAL DATA:  Chest pain. EXAM: PORTABLE CHEST 1 VIEW COMPARISON:  June 15, 2017 FINDINGS: The cardiac silhouette is mildly enlarged and unchanged in size. Mild, diffuse, chronic appearing increased lung markings are noted. Very mild atelectasis is seen within the bilateral lung bases. There is no evidence of focal consolidation, pleural effusion or pneumothorax. Multilevel degenerative changes seen throughout the thoracic spine. IMPRESSION: Mild cardiomegaly with very mild bibasilar atelectasis. Electronically Signed   By: Aram Candela M.D.   On: 06/09/2023 01:00     Assessment and Plan:   NSTEMI Tachyarrhythmia Asymptomatic, presented to ED noted to be in some tachyarrhythmia due to elevate HR on BP cuff.  EDP reported flutter/fib with spontaneous conversion to NSR and frequent PACs and PVCs after IV Lopressor 5 mg.  EKG showed subtle ST depressions, troponins rising up to 2100+.  Difficult to say whether this is demand ischemia from tachyarrhythmia versus true NSTEMI.  Given that he is asymptomatic with defer cardiac catheterization for the time being but will medically treat.  He did have atherosclerosis noted on CTA, negative PE.  Will discuss with MD about further ischemic evaluation, since he has frequent ectopy could consider ischemic driven so perhaps cardiac MRI may be warranted.  Euvolemic. Will follow-up on echocardiogram.  Checking TSH, magnesium, LP(a), A1c, lipid panel. Started on aspirin, Toprol-XL  25 mg, rosuvastatin 10 mg. Continue IV heparin for at least 48 hours.  Malignant neoplasm of supraglottis stage III Hoarseness Reportedly SCC.  Follows oncology, reportedly has no evidence of recurrence.  He does have some dysphagia but does exercises at home.  OSA? COPD Previous pulmonary hypertension noted on other imaging modalities and has nocturnal hypoxemia noted on chart.  Likely needs outpatient sleep study.  Continue inhalers.  Hypertension Hold amlodipine with new addition of Toprol-XL.  Reasonably controlled right now.  Hyperlipidemia Per chart review had myalgias with rosuvastatin and simvastatin.  Suspect he is on low-dose rosuvastatin because of this.  Check LDL.   Risk Assessment/Risk Scores:   TIMI Risk Score for Unstable Angina or Non-ST Elevation MI:   The patient's TIMI risk score is 1, which indicates a 5% risk of all cause mortality, new or recurrent myocardial infarction or need for urgent revascularization in the next 14 days.{   Code Status: Full Code patient needs to discuss with family if this is truly what he wants.  He was uncertain but we agreed upon this.  Severity of Illness: The appropriate patient status for this patient is OBSERVATION. Observation status is judged to be reasonable and necessary in order to provide the required intensity of service to ensure the patient's safety. The patient's presenting symptoms, physical exam findings, and initial radiographic and laboratory data in the context of their medical condition is felt to place them at decreased risk for further clinical deterioration. Furthermore, it is anticipated that the patient will be medically stable for discharge from the hospital within 2 midnights of admission.    For questions or updates, please contact [facility] Please consult [email or web address] for contact info under     Signed, Abagail Kitchens, PA-C  06/09/2023 12:08 PM      Patient seen and examined. Agree with  assessment and plan.  History is also obtained from daughter who  is in the room.  Julian Brown is an 89 year old gentleman who has a remote tobacco history with subsequent development of supraglottis neoplasm for which he underwent maximum dose of radiation and surgery.  Over the past 1-1/2 to 2 years, he has lost almost 90 pounds.  Previously he had been on amlodipine but has not been taking this medication.  He has a history of prostate CVA.  He denies any known cardiac history.  Apparently, last evening, his daughter was taking his blood pressure and noted his blood pressure to be elevated.  He ultimately presented to the [facility] emergency room where ECG showed SVT at a rate of approximately 146 bpm.  He was treated with IV Lopressor 5 mg and then there was a question of possible a flutter versus A-fib with rapid conversion back to sinus rhythm with frequent PACs and PVCs.  A chest CT was negative for pulmonary embolism.  Troponins were mildly increased initially at 1868 > 2131 which most likely represents demand ischemia.  At no time did he experience any chest tightness.  [facility] ultimately was [facility].  Subsequent ECGs have shown sinus rhythm with multifocal PVCs at a rate of 83 as well as sinus rhythm with atrial bigeminy and isolated PVC at 93 bpm at 3:02, and 3:24 AM respectively.  Presently, he is maintaining sinus rhythm but continues to have occasional to frequent PACs with rare PVCs.  He had received a dose of metoprolol succinate 25 mg.  HEENT is unremarkable.  He is status post supraglottis surgery.  There is no JVD.  There are no rales.  Breath sounds are slightly decreased at bases.  Rhythm is regular with 1/6 systolic murmur.  There is no S3 gallop.  Abdomen soft nontender.  He does have trace pretibial edema above his sock line.  Additional laboratories notable for BNP at 590, BUN 17 creatinine 0.85, GFR greater than 60.  Hemoglobin 14.8, hematocrit  44.8.  He is on IV heparin.  TSH is normal at 1.496. CT angio of his chest was negative for PE.  There was atelectasis.  There was evidence for moderate aortic and coronary calcification.  Presently, since he had received a dose of metoprolol succinate 25 mg, I will give a dose of metoprolol tartrate 12.5 mg now to further suppress his PACs and rare PVCs.  He is asymptomatic with reference to chest pain or shortness of breath.  Echo Doppler study was being completed and is not yet available for review but images suggest normal systolic function.  Plan to titrate beta-blocker as needed.  Check magnesium.  Serial EKG. Will give a dose of furosemide 10 mg iv with elevated BNP and very mild lower extremity edema.  With coronary calcification, consider retrial of low-dose statin therapy.  Consider subsequent cardiac MRI or possible coronary CTA.  At present continue heparin with likely discontinuance in 24 to 48 hours.  Lennette Bihari, MD, [facility] 06/09/2023 2:12 PM

## 2023-06-09 NOTE — ED Triage Notes (Signed)
Pt POV from home with family reporting hr 130s this evening. While on the way to the ED pt began having mid chest discomfort. Denies SOB.

## 2023-06-09 NOTE — ED Notes (Signed)
Called report to [place], Charity fundraiser at [facility] for [phone].

## 2023-06-09 NOTE — Plan of Care (Signed)

## 2023-06-09 NOTE — H&P (Signed)
Cardiology Admission History and Physical   Patient ID: Julian Brown MRN: [id number]; DOB: 1933-06-09   As of 5:45am, still awaiting transfer to [facility]. Below is a brief chart review and preliminary assessment and plan that is subject to change once patient arrives and is examined.   Chief Complaint:  chest pain and tachycardia  Patient Profile:   Julian Brown is a 89 y.o. male with no prior CAD history who is being seen 06/09/2023 for the evaluation of tachycardia and chest tightness.  History of Present Illness:   Julian Brown was directed to the ED primarily because his home BP cuff was repeated showing tachycardia to the 130-140s. He denies any palpitations.  When asked, he does say he is experiencing some chest tightness which started while he was coming into the ED.  No SOB  H/O layngeal SCC s/p resection and XRT and has chronic hoarseness, former smoker. Also COPD on home O2 at night, prostate CA, colon CA previously.  In ED, initial ECG showed a tachycardia in 140s range, unclear if ST vs. Supraventricular tachycardia (eg atrial flutter) as baseline artifact present throughout. Got 5 mg IV metoprolol and the rate slowed slightly in response. Then in report from ED physician, he developed more irregularity in his rhythm c/w atrial fibrillation (no 12 lead demonstrating this), then spontaneously converted to SR.  His latest ECG shows sinus rhythm with frequent PACs/PVCs.  His troponin on ED arrival was 1868, which uptrended to 2131 2 hours later. Chest CT negative for PE.  In Care Everywhere, he had a nuclear stress test in 2017 without evidence of ischemia   Assessment and Plan:   Chest tightness and initial tachycardia to the 150s (possibly atrial flutter vs. atrial fibrillation), with troponin elevation suggestive of NSTEMI. Chest CT commented on coronary atherosclerosis.  Consider: Overnight heparinization Likely will need a cath to elucidate coronary anatomy   Would also be helpful to get an echo. Will check lipids, and start high intensity statin for now. Also starting B-blocker given ACS and tachycardia. Holding home amlodipine for now as he probably benefits more from BB.  No clear diagnosis yet for tachycardia, will watch on telemetry and beta blockade On heparin but not yet a firm indication for longterm oral anticoagulation as so far no ECG demonstrating Afib, and if he was in Afib in the ED, it was short in duration.   Additional data:   High Sensitivity Troponin:   Recent Labs  Lab 06/09/23 0017 06/09/23 0211  TROPONINIHS 1,868* 2,131*      Chemistry Recent Labs  Lab 06/09/23 0017  NA 138  K 4.0  CL 100  CO2 31  GLUCOSE 108*  BUN 17  CREATININE 0.85  CALCIUM 9.8  GFRNONAA >60  ANIONGAP 7    No results for input(s): "PROT", "ALBUMIN", "AST", "ALT", "ALKPHOS", "BILITOT" in the last 168 hours. Lipids No results for input(s): "CHOL", "TRIG", "HDL", "LABVLDL", "LDLCALC", "CHOLHDL" in the last 168 hours. Hematology Recent Labs  Lab 06/09/23 0017  WBC 8.0  RBC 4.49  HGB 14.8  HCT 44.8  MCV 99.8  MCH 33.0  MCHC 33.0  RDW 13.2  PLT 201   Thyroid No results for input(s): "TSH", "FREET4" in the last 168 hours. BNPNo results for input(s): "BNP", "PROBNP" in the last 168 hours.  DDimer No results for input(s): "DDIMER" in the last 168 hours.   Radiology/Studies:  CT Angio Chest PE W and/or Wo Contrast Result Date: 06/09/2023 CLINICAL DATA:  Pulmonary embolism (PE) suspected, high prob. Chest discomfort. EXAM: CT ANGIOGRAPHY CHEST WITH CONTRAST TECHNIQUE: Multidetector CT imaging of the chest was performed using the standard protocol during bolus administration of intravenous contrast. Multiplanar CT image reconstructions and MIPs were obtained to evaluate the vascular anatomy. RADIATION DOSE REDUCTION: This exam was performed according to the departmental dose-optimization program which includes automated exposure  control, adjustment of the mA and/or kV according to patient size and/or use of iterative reconstruction technique. CONTRAST:  75mL OMNIPAQUE IOHEXOL 350 MG/ML SOLN COMPARISON:  06/01/2021 FINDINGS: Cardiovascular: No filling defects in the pulmonary arteries to suggest pulmonary emboli. Mild cardiomegaly. Aorta normal caliber. Moderate coronary artery and aortic atherosclerosis. Mediastinum/Nodes: No mediastinal, hilar, or axillary adenopathy. Trachea and esophagus are unremarkable. Thyroid unremarkable. Lungs/Pleura: Left base atelectasis. Right middle lobe atelectasis. No additional confluent opacities or effusions. Upper Abdomen: Numerous cysts throughout the liver are again noted, unchanged. These are compatible with benign cysts. No acute findings. Musculoskeletal: Chest wall soft tissues are unremarkable. No acute bony abnormality. Review of the MIP images confirms the above findings. IMPRESSION: No evidence of pulmonary embolus. Mild cardiomegaly. Areas of atelectasis in the right middle lobe and left lower lobe. Coronary artery disease. Aortic Atherosclerosis (ICD10-I70.0). Electronically Signed   By: Charlett Nose M.D.   On: 06/09/2023 02:29   DG Chest Port 1 View Result Date: 06/09/2023 CLINICAL DATA:  Chest pain. EXAM: PORTABLE CHEST 1 VIEW COMPARISON:  June 15, 2017 FINDINGS: The cardiac silhouette is mildly enlarged and unchanged in size. Mild, diffuse, chronic appearing increased lung markings are noted. Very mild atelectasis is seen within the bilateral lung bases. There is no evidence of focal consolidation, pleural effusion or pneumothorax. Multilevel degenerative changes seen throughout the thoracic spine. IMPRESSION: Mild cardiomegaly with very mild bibasilar atelectasis. Electronically Signed   By: Aram Candela M.D.   On: 06/09/2023 01:00      Signed, Eyvonne Left, MD  06/09/2023 5:47 AM

## 2023-06-09 NOTE — Progress Notes (Addendum)
ANTICOAGULATION CONSULT NOTE  Pharmacy Consult for heparin Indication: chest pain/ACS  Allergies  Allergen Reactions   Colesevelam Other (See Comments)    increases TG, constipation   Fenofibrate Other (See Comments)     muscle aches   Rosuvastatin Other (See Comments)    Myalgias   Simvastatin Other (See Comments)    Myalgias   Amoxicillin-Pot Clavulanate Rash   Penicillins Swelling    Patient Measurements: Height: 5\' 9"  (175.3 cm) Weight: 71 kg (156 lb 8 oz) IBW/kg (Calculated) : 70.7 Heparin Dosing Weight: 71.7 kg  Vital Signs: Temp: 98.2 F (36.8 C) (12/27 1011) Temp Source: Oral (12/27 1011) BP: 144/70 (12/27 1124) Pulse Rate: 35 (12/27 1015)  Labs: Recent Labs    06/09/23 0017 06/09/23 0211 06/09/23 1224  HGB 14.8  --   --   HCT 44.8  --   --   PLT 201  --   --   HEPARINUNFRC  --   --  0.23*  CREATININE 0.85  --   --   TROPONINIHS 1,868* 2,131*  --     Estimated Creatinine Clearance: 60.1 mL/min (by C-G formula based on SCr of 0.85 mg/dL).  Medical History: Past Medical History:  Diagnosis Date   Acute gangrenous cholecystitis s/p lap cholecystectomy 06/08/2017 06/08/2017   Arthritis    Colon cancer [facility])    ED (erectile dysfunction)    Emphysema of lung (HCC)    Hyperlipemia    Hypertension    Prostate cancer (HCC)    prostate cancer-tx radiation   Wears glasses     Medications:  Infusions:   heparin 850 Units/hr (06/09/23 1127)    Assessment: 89 yo male presents with chest pain and elevated troponins.  Not on anticoagulation prior to admission. Pharmacy consulted for heparin dosing.  Initial heparin level 0.23 subtherapeutic on 850 units/hr.  CBC this morning stable (Hgb 14.8, pltc 201).  Plan for 48h of medical management per cardiology - EOT tentatively 12/29 0200.  Goal of Therapy:  Heparin level 0.3-0.7 units/ml Monitor platelets by anticoagulation protocol: Yes   Plan:  Increase heparin infusion 950 units/hr 8 hour heparin  level Daily heparin level, CBC  Monitor s/sx bleeding  Trixie Rude, PharmD Clinical Pharmacist 06/09/2023  1:41 PM

## 2023-06-09 NOTE — Progress Notes (Signed)
  Echocardiogram 2D Echocardiogram has been performed.  Julian Brown 06/09/2023, 2:27 PM

## 2023-06-10 DIAGNOSIS — I249 Acute ischemic heart disease, unspecified: Secondary | ICD-10-CM | POA: Diagnosis not present

## 2023-06-10 LAB — BASIC METABOLIC PANEL
Anion gap: 5 (ref 5–15)
BUN: 12 mg/dL (ref 8–23)
CO2: 27 mmol/L (ref 22–32)
Calcium: 9.1 mg/dL (ref 8.9–10.3)
Chloride: 104 mmol/L (ref 98–111)
Creatinine, Ser: 0.8 mg/dL (ref 0.61–1.24)
GFR, Estimated: >60 mL/min (ref >60)
Glucose, Bld: 92 mg/dL (ref 70–99)
Potassium: 3.8 mmol/L (ref 3.5–5.1)
Sodium: 136 mmol/L (ref 135–145)

## 2023-06-10 LAB — HEPARIN LEVEL (UNFRACTIONATED)
Heparin Unfractionated: 0.4 [IU]/mL (ref 0.30–0.70)
Heparin Unfractionated: 0.2 [IU]/mL — ABNORMAL LOW (ref 0.30–0.70)

## 2023-06-10 LAB — LIPOPROTEIN A (LPA): Lipoprotein (a): 19.2 nmol/L (ref <75.0)

## 2023-06-10 LAB — CBC
HCT: 39.7 % (ref 39.0–52.0)
Hemoglobin: 13.3 g/dL (ref 13.0–17.0)
MCH: 33.4 pg (ref 26.0–34.0)
MCHC: 33.5 g/dL (ref 30.0–36.0)
MCV: 99.7 fL (ref 80.0–100.0)
Platelets: 177 10*3/uL (ref 150–400)
RBC: 3.98 MIL/uL — ABNORMAL LOW (ref 4.22–5.81)
RDW: 13.1 % (ref 11.5–15.5)
WBC: 6.7 10*3/uL (ref 4.0–10.5)
nRBC: 0 % (ref 0.0–0.2)

## 2023-06-10 LAB — HEMOGLOBIN A1C
Hgb A1c MFr Bld: 5.4 % (ref 4.8–5.6)
Mean Plasma Glucose: 108 mg/dL

## 2023-06-10 MED ORDER — CARVEDILOL 3.125 MG PO TABS
3.1250 mg | ORAL_TABLET | Freq: Two times a day (BID) | ORAL | Status: DC
  Administered 2023-06-10 – 2023-06-13 (×7): 3.125 mg via ORAL
  Filled 2023-06-10 (×7): qty 1

## 2023-06-10 NOTE — Progress Notes (Signed)
ANTICOAGULATION CONSULT NOTE  Pharmacy Consult for heparin Indication: chest pain/ACS  Allergies  Allergen Reactions   Colesevelam Other (See Comments)    increases TG, constipation   Fenofibrate Other (See Comments)     muscle aches   Rosuvastatin Other (See Comments)    Myalgias   Simvastatin Other (See Comments)    Myalgias   Amoxicillin-Pot Clavulanate Rash   Penicillins Swelling    Patient Measurements: Height: 5\' 9"  (175.3 cm) Weight: 71 kg (156 lb 8 oz) IBW/kg (Calculated) : 70.7 Heparin Dosing Weight: 71.7 kg  Vital Signs: Temp: 98.1 F (36.7 C) (12/28 1443) Temp Source: Oral (12/28 1443) BP: 153/83 (12/28 1729) Pulse Rate: 71 (12/28 1443)  Labs: Recent Labs    06/09/23 0017 06/09/23 0211 06/09/23 1224 06/09/23 2309 06/10/23 0626 06/10/23 0859 06/10/23 1638  HGB 14.8  --   --   --  13.3  --   --   HCT 44.8  --   --   --  39.7  --   --   PLT 201  --   --   --  177  --   --   HEPARINUNFRC  --   --    < > 0.16*  --  0.40 0.20*  CREATININE 0.85  --   --   --  0.80  --   --   TROPONINIHS 1,868* 2,131*  --   --   --   --   --    < > = values in this interval not displayed.    Estimated Creatinine Clearance: 63.8 mL/min (by C-G formula based on SCr of 0.8 mg/dL).  Medical History: Past Medical History:  Diagnosis Date   Acute gangrenous cholecystitis s/p lap cholecystectomy 06/08/2017 06/08/2017   Arthritis    Colon cancer [facility])    ED (erectile dysfunction)    Emphysema of lung (HCC)    Hyperlipemia    Hypertension    Prostate cancer (HCC)    prostate cancer-tx radiation   Wears glasses     Medications:  Infusions:   heparin 1,000 Units/hr (06/10/23 0101)    Assessment: 89 yo male presents with chest pain and elevated troponins.  Not on anticoagulation prior to admission. Pharmacy consulted for heparin dosing.  Heparin level 0.2 is subtherapeutic on 1000 units/hr. No issues with infusion or bleeding per RN.  Goal of Therapy:  Heparin  level 0.3-0.7 units/ml Monitor platelets by anticoagulation protocol: Yes   Plan:  Increase heparin infusion to 1150 units/hr  Monitor heparin level, CBC, and signs or bleeding daily. F/u plans for [facility]  Alphia Moh, PharmD, BCPS, [facility] Clinical Pharmacist  Please check AMION for all [facility] Pharmacy phone numbers After 10:00 PM, call Main Pharmacy [phone]

## 2023-06-10 NOTE — Plan of Care (Signed)
  Problem: Education: Goal: Knowledge of General Education information will improve Description: Including pain rating scale, medication(s)/side effects and non-pharmacologic comfort measures Outcome: Progressing   Problem: Clinical Measurements: Goal: Cardiovascular complication will be avoided Outcome: Progressing   Problem: Activity: Goal: Risk for activity intolerance will decrease Outcome: Progressing   Problem: Nutrition: Goal: Adequate nutrition will be maintained Outcome: Progressing   Problem: Pain Management: Goal: General experience of comfort will improve Outcome: Progressing   Problem: Safety: Goal: Ability to remain free from injury will improve Outcome: Progressing

## 2023-06-10 NOTE — Progress Notes (Addendum)
ANTICOAGULATION CONSULT NOTE  Pharmacy Consult for heparin Indication: chest pain/ACS  Allergies  Allergen Reactions   Colesevelam Other (See Comments)    increases TG, constipation   Fenofibrate Other (See Comments)     muscle aches   Rosuvastatin Other (See Comments)    Myalgias   Simvastatin Other (See Comments)    Myalgias   Amoxicillin-Pot Clavulanate Rash   Penicillins Swelling    Patient Measurements: Height: 5\' 9"  (175.3 cm) Weight: 71 kg (156 lb 8 oz) IBW/kg (Calculated) : 70.7 Heparin Dosing Weight: 71.7 kg  Vital Signs: Temp: 97.3 F (36.3 C) (12/28 0431) Temp Source: Oral (12/28 0431) BP: 120/78 (12/28 1029) Pulse Rate: 91 (12/28 0431)  Labs: Recent Labs    06/09/23 0017 06/09/23 0211 06/09/23 1224 06/09/23 2309 06/10/23 0626 06/10/23 0859  HGB 14.8  --   --   --  13.3  --   HCT 44.8  --   --   --  39.7  --   PLT 201  --   --   --  177  --   HEPARINUNFRC  --   --  0.23* 0.16*  --  0.40  CREATININE 0.85  --   --   --  0.80  --   TROPONINIHS 1,868* 2,131*  --   --   --   --     Estimated Creatinine Clearance: 63.8 mL/min (by C-G formula based on SCr of 0.8 mg/dL).  Medical History: Past Medical History:  Diagnosis Date   Acute gangrenous cholecystitis s/p lap cholecystectomy 06/08/2017 06/08/2017   Arthritis    Colon cancer [facility])    ED (erectile dysfunction)    Emphysema of lung (HCC)    Hyperlipemia    Hypertension    Prostate cancer (HCC)    prostate cancer-tx radiation   Wears glasses     Medications:  Infusions:   heparin 1,000 Units/hr (06/10/23 0101)    Assessment: 89 yo male presents with chest pain and elevated troponins.  Not on anticoagulation prior to admission. Pharmacy consulted for heparin dosing.  12/28 1000: Heparin level 0.40, therapeutic on heparin 1000 units/hr. No issues with infusion running or signs of bleeding per RN. CBC stable (Hgb 13.3, PLT 177). Per MD, continuing heparin until patient is able to get LHC.    Goal of Therapy:  Heparin level 0.3-0.7 units/ml Monitor platelets by anticoagulation protocol: Yes   Plan:  Continue heparin infusion at 1000 units/hr 8 hour confirmatory heparin level Monitor heparin level, CBC, and signs or bleeding daily. F/u plans for [facility]  Enos Fling, PharmD PGY-1 Acute Care Pharmacy Resident 06/10/2023 10:48 AM

## 2023-06-10 NOTE — Progress Notes (Signed)
   Rounding Note    Patient Name: Julian Brown Date of Encounter: 06/10/2023  [facility] Cardiologist: None   Subjective   NAEO. Shaving this AM. Daughter at bedside.  Vital Signs    Vitals:   06/09/23 1124 06/09/23 2032 06/10/23 0431 06/10/23 1029  BP: (!) 144/70 (!) 143/69 139/74 120/78  Pulse:  (!) 53 91   Resp: 18 16 14 16   Temp:  97.6 F (36.4 C) (!) 97.3 F (36.3 C)   TempSrc:  Oral Oral   SpO2: 97%     Weight: 71 kg     Height: 5\' 9"  (1.753 m)       Intake/Output Summary (Last 24 hours) at 06/10/2023 1032 Last data filed at 06/10/2023 0432 Gross per 24 hour  Intake 321.54 ml  Output 300 ml  Net 21.54 ml      06/09/2023   11:24 AM 06/09/2023   12:09 AM 12/08/2022   10:58 AM  Last 3 Weights  Weight (lbs) 156 lb 8 oz 158 lb 159 lb 8 oz  Weight (kg) 70.988 kg 71.668 kg 72.349 kg      Telemetry    Personally Reviewed  ECG    Personally Reviewed  Physical Exam   GEN: No acute distress.  Elderly. Cardiac: irregular rhythm, no murmurs, rubs, or gallops.  Respiratory: Clear to auscultation bilaterally. Psych: Normal affect   Assessment & Plan    #NSTEMI Chest pain free this AM. Hemodynamically stable. EF reduced on echo this admission, 40%. - cont aspirin - cont statin - cont heparin through today at least  Given the reduced EF and troponin elevation, I have discussed coronary angiography with the patient and his daughter. I have discussed pros/cons of cath vs conservative management. The patient's daughter will talk to her sister and mother regarding options and will let us know how they would like to proceed.      Sheria Lang T. Lalla Brothers, MD, [facility], [facility] Cardiac Electrophysiology

## 2023-06-10 NOTE — Progress Notes (Signed)
ANTICOAGULATION CONSULT NOTE  Pharmacy Consult for heparin Indication: chest pain/ACS  Allergies  Allergen Reactions   Colesevelam Other (See Comments)    increases TG, constipation   Fenofibrate Other (See Comments)     muscle aches   Rosuvastatin Other (See Comments)    Myalgias   Simvastatin Other (See Comments)    Myalgias   Amoxicillin-Pot Clavulanate Rash   Penicillins Swelling    Patient Measurements: Height: 5\' 9"  (175.3 cm) Weight: 71 kg (156 lb 8 oz) IBW/kg (Calculated) : 70.7 Heparin Dosing Weight: 71.7 kg  Vital Signs: Temp: 97.6 F (36.4 C) (12/27 2032) Temp Source: Oral (12/27 2032) BP: 143/69 (12/27 2032) Pulse Rate: 53 (12/27 2032)  Labs: Recent Labs    06/09/23 0017 06/09/23 0211 06/09/23 1224 06/09/23 2309  HGB 14.8  --   --   --   HCT 44.8  --   --   --   PLT 201  --   --   --   HEPARINUNFRC  --   --  0.23* 0.16*  CREATININE 0.85  --   --   --   TROPONINIHS 1,868* 2,131*  --   --     Estimated Creatinine Clearance: 60.1 mL/min (by C-G formula based on SCr of 0.85 mg/dL).  Medical History: Past Medical History:  Diagnosis Date   Acute gangrenous cholecystitis s/p lap cholecystectomy 06/08/2017 06/08/2017   Arthritis    Colon cancer [facility])    ED (erectile dysfunction)    Emphysema of lung (HCC)    Hyperlipemia    Hypertension    Prostate cancer (HCC)    prostate cancer-tx radiation   Wears glasses     Medications:  Infusions:   heparin 850 Units/hr (06/09/23 2350)    Assessment: 89 yo male presents with chest pain and elevated troponins.  Not on anticoagulation prior to admission. Pharmacy consulted for heparin dosing.  Initial heparin level 0.23 subtherapeutic on 850 units/hr.  CBC this morning stable (Hgb 14.8, pltc 201).  Plan for 48h of medical management per cardiology - EOT tentatively 12/29 0200.  12/28 AM update: Heparin level sub-therapeutic on 850 units/hr of heparin  Goal of Therapy:  Heparin level 0.3-0.7  units/ml Monitor platelets by anticoagulation protocol: Yes   Plan:  Increase heparin infusion to 1000 units/hr 8 hour heparin level  Abran Duke, PharmD, BCPS Clinical Pharmacist Phone: [phone]

## 2023-06-10 NOTE — Plan of Care (Signed)
  Problem: Clinical Measurements: Goal: Ability to maintain clinical measurements within normal limits will improve Outcome: Progressing Goal: Respiratory complications will improve Outcome: Progressing Goal: Cardiovascular complication will be avoided Outcome: Progressing   Problem: Activity: Goal: Risk for activity intolerance will decrease Outcome: Progressing   Problem: Cardiac: Goal: Ability to achieve and maintain adequate cardiovascular perfusion will improve Outcome: Progressing

## 2023-06-11 DIAGNOSIS — I249 Acute ischemic heart disease, unspecified: Secondary | ICD-10-CM | POA: Diagnosis not present

## 2023-06-11 LAB — BASIC METABOLIC PANEL
Anion gap: 8 (ref 5–15)
BUN: 10 mg/dL (ref 8–23)
CO2: 25 mmol/L (ref 22–32)
Calcium: 9.1 mg/dL (ref 8.9–10.3)
Chloride: 104 mmol/L (ref 98–111)
Creatinine, Ser: 0.85 mg/dL (ref 0.61–1.24)
GFR, Estimated: >60 mL/min (ref >60)
Glucose, Bld: 96 mg/dL (ref 70–99)
Potassium: 3.9 mmol/L (ref 3.5–5.1)
Sodium: 137 mmol/L (ref 135–145)

## 2023-06-11 LAB — HEPARIN LEVEL (UNFRACTIONATED)
Heparin Unfractionated: 0.37 [IU]/mL (ref 0.30–0.70)
Heparin Unfractionated: 0.39 [IU]/mL (ref 0.30–0.70)

## 2023-06-11 LAB — CBC
HCT: 40.8 % (ref 39.0–52.0)
Hemoglobin: 13.9 g/dL (ref 13.0–17.0)
MCH: 33.3 pg (ref 26.0–34.0)
MCHC: 34.1 g/dL (ref 30.0–36.0)
MCV: 97.6 fL (ref 80.0–100.0)
Platelets: 198 10*3/uL (ref 150–400)
RBC: 4.18 MIL/uL — ABNORMAL LOW (ref 4.22–5.81)
RDW: 13.2 % (ref 11.5–15.5)
WBC: 8.3 10*3/uL (ref 4.0–10.5)
nRBC: 0 % (ref 0.0–0.2)

## 2023-06-11 MED ORDER — SODIUM CHLORIDE 0.9 % IV SOLN: INTRAVENOUS | Status: DC

## 2023-06-11 MED ORDER — COLCHICINE 0.6 MG PO TABS
1.2000 mg | ORAL_TABLET | Freq: Once | ORAL | Status: AC
  Administered 2023-06-11: 1.2 mg via ORAL
  Filled 2023-06-11: qty 2

## 2023-06-11 MED ORDER — ASPIRIN 81 MG PO CHEW
81.0000 mg | CHEWABLE_TABLET | ORAL | Status: AC
  Administered 2023-06-12: 81 mg via ORAL
  Filled 2023-06-11: qty 1

## 2023-06-11 MED ORDER — COLCHICINE 0.6 MG PO TABS
0.6000 mg | ORAL_TABLET | Freq: Once | ORAL | Status: AC
  Administered 2023-06-11: 0.6 mg via ORAL
  Filled 2023-06-11: qty 1

## 2023-06-11 NOTE — Progress Notes (Signed)
ANTICOAGULATION CONSULT NOTE  Pharmacy Consult for heparin Indication: chest pain/ACS  Allergies  Allergen Reactions   Colesevelam Other (See Comments)    increases TG, constipation   Fenofibrate Other (See Comments)     muscle aches   Rosuvastatin Other (See Comments)    Myalgias   Simvastatin Other (See Comments)    Myalgias   Amoxicillin-Pot Clavulanate Rash   Penicillins Swelling    Patient Measurements: Height: 5\' 9"  (175.3 cm) Weight: 71 kg (156 lb 8 oz) IBW/kg (Calculated) : 70.7 Heparin Dosing Weight: 71.7 kg  Vital Signs: Temp: 97.7 F (36.5 C) (12/29 1626) Temp Source: Oral (12/29 1626) BP: 108/56 (12/29 1626) Pulse Rate: 78 (12/29 1626)  Labs: Recent Labs    06/09/23 0017 06/09/23 0211 06/09/23 1224 06/10/23 0626 06/10/23 0859 06/10/23 1638 06/11/23 0319 06/11/23 0744 06/11/23 1546  HGB 14.8  --   --  13.3  --   --  13.9  --   --   HCT 44.8  --   --  39.7  --   --  40.8  --   --   PLT 201  --   --  177  --   --  198  --   --   HEPARINUNFRC  --   --    < >  --    < > 0.20*  --  0.37 0.39  CREATININE 0.85  --   --  0.80  --   --  0.85  --   --   TROPONINIHS 1,868* 2,131*  --   --   --   --   --   --   --    < > = values in this interval not displayed.    Estimated Creatinine Clearance: 60.1 mL/min (by C-G formula based on SCr of 0.85 mg/dL).  Medical History: Past Medical History:  Diagnosis Date   Acute gangrenous cholecystitis s/p lap cholecystectomy 06/08/2017 06/08/2017   Arthritis    Colon cancer [facility])    ED (erectile dysfunction)    Emphysema of lung (HCC)    Hyperlipemia    Hypertension    Prostate cancer (HCC)    prostate cancer-tx radiation   Wears glasses     Medications:  Infusions:   heparin 1,150 Units/hr (06/11/23 0001)    Assessment: 89 yo male presents with chest pain and elevated troponins.  Not on anticoagulation prior to admission. Pharmacy consulted for heparin dosing.  12/29 AM: Heparin level 0.37, therapeutic  on heparin 1150 units/hr. No issues with infusion running or signs of bleeding per RN. CBC stable (Hgb 13.9, PLT 198).   12/29 PM: Heparin level remains therapeutic at 0.39.  Goal of Therapy:  Heparin level 0.3-0.7 units/ml Monitor platelets by anticoagulation protocol: Yes   Plan:  Continue heparin infusion at 1150 units/hr  Monitor heparin level, CBC, and signs of bleeding daily. F/u plans for [facility]   Thank you for allowing pharmacy to be a part of this patients care.   Signe Colt, PharmD 06/11/2023 4:33 PM  **Pharmacist phone directory can be found on [email or web address] listed under [facility]**

## 2023-06-11 NOTE — Progress Notes (Signed)
ANTICOAGULATION CONSULT NOTE  Pharmacy Consult for heparin Indication: chest pain/ACS  Allergies  Allergen Reactions   Colesevelam Other (See Comments)    increases TG, constipation   Fenofibrate Other (See Comments)     muscle aches   Rosuvastatin Other (See Comments)    Myalgias   Simvastatin Other (See Comments)    Myalgias   Amoxicillin-Pot Clavulanate Rash   Penicillins Swelling    Patient Measurements: Height: 5\' 9"  (175.3 cm) Weight: 71 kg (156 lb 8 oz) IBW/kg (Calculated) : 70.7 Heparin Dosing Weight: 71.7 kg  Vital Signs: Temp: 99.1 F (37.3 C) (12/29 0817) Temp Source: Oral (12/29 0817) BP: 144/72 (12/29 0817)  Labs: Recent Labs    06/09/23 0017 06/09/23 0211 06/09/23 1224 06/10/23 0626 06/10/23 0859 06/10/23 1638 06/11/23 0319 06/11/23 0744  HGB 14.8  --   --  13.3  --   --  13.9  --   HCT 44.8  --   --  39.7  --   --  40.8  --   PLT 201  --   --  177  --   --  198  --   HEPARINUNFRC  --   --    < >  --  0.40 0.20*  --  0.37  CREATININE 0.85  --   --  0.80  --   --  0.85  --   TROPONINIHS 1,868* 2,131*  --   --   --   --   --   --    < > = values in this interval not displayed.    Estimated Creatinine Clearance: 60.1 mL/min (by C-G formula based on SCr of 0.85 mg/dL).  Medical History: Past Medical History:  Diagnosis Date   Acute gangrenous cholecystitis s/p lap cholecystectomy 06/08/2017 06/08/2017   Arthritis    Colon cancer [facility])    ED (erectile dysfunction)    Emphysema of lung (HCC)    Hyperlipemia    Hypertension    Prostate cancer (HCC)    prostate cancer-tx radiation   Wears glasses     Medications:  Infusions:   heparin 1,150 Units/hr (06/11/23 0001)    Assessment: 89 yo male presents with chest pain and elevated troponins.  Not on anticoagulation prior to admission. Pharmacy consulted for heparin dosing.  12/29 AM: Heparin level 0.37, therapeutic on heparin 1150 units/hr. No issues with infusion running or signs of  bleeding per RN. CBC stable (Hgb 13.9, PLT 198).   Goal of Therapy:  Heparin level 0.3-0.7 units/ml Monitor platelets by anticoagulation protocol: Yes   Plan:  Continue heparin infusion at 1150 units/hr  Check confirmatory heparin level in 8 hours Monitor heparin level, CBC, and signs or bleeding daily. F/u plans for [facility]  Enos Fling, PharmD PGY-1 Acute Care Pharmacy Resident 06/11/2023 9:42 AM  Please check AMION for all [facility] Pharmacy phone numbers After 10:00 PM, call Main Pharmacy [phone]

## 2023-06-11 NOTE — Plan of Care (Signed)

## 2023-06-11 NOTE — Progress Notes (Signed)
° °  Rounding Note    Patient Name: Julian Brown Date of Encounter: 06/11/2023  [facility] Cardiologist: None   Subjective   NAEO. Eating breakfast. Reports L wrist pain.  Vital Signs    Vitals:   06/10/23 1729 06/10/23 2044 06/11/23 0410 06/11/23 0817  BP: (!) 153/83 128/84 (!) 145/62 (!) 144/72  Pulse:  (!) 46    Resp:  15 18 18   Temp:  98.6 F (37 C) 99 F (37.2 C) 99.1 F (37.3 C)  TempSrc:  Oral Oral Oral  SpO2:  96%    Weight:      Height:        Intake/Output Summary (Last 24 hours) at 06/11/2023 0947 Last data filed at 06/11/2023 0804 Gross per 24 hour  Intake 117 ml  Output 700 ml  Net -583 ml      06/09/2023   11:24 AM 06/09/2023   12:09 AM 12/08/2022   10:58 AM  Last 3 Weights  Weight (lbs) 156 lb 8 oz 158 lb 159 lb 8 oz  Weight (kg) 70.988 kg 71.668 kg 72.349 kg      Telemetry    Personally Reviewed  ECG    Personally Reviewed  Physical Exam   GEN: No acute distress.  Elderly. Cardiac: irregular rhythm, no murmurs, rubs, or gallops.  Respiratory: Clear to auscultation bilaterally. Psych: Normal affect  ORTHO: L wrist swollen and warm. No erythma. Able to move the join with pain.  Assessment & Plan    #NSTEMI Chest pain free. Hemodynamically stable. EF reduced on echo this admission, 40%. - cont aspirin - cont statin - cont heparin  Given the reduced EF and troponin elevation, I have discussed coronary angiography with the patient and his daughter. I have discussed pros/cons of cath vs conservative management.  Family discussed options yesterday and would like to proceed with LHC.  #L wrist pain Suspect gout. Will treat with colchicine and see if improves. If no improvement by tomorrow, plan to consult medicine team.    Rossie Muskrat. Lalla Brothers, MD, [employer], [facility] Cardiac Electrophysiology

## 2023-06-12 ENCOUNTER — Telehealth (HOSPITAL_COMMUNITY): Payer: Self-pay | Admitting: Pharmacy Technician

## 2023-06-12 ENCOUNTER — Encounter: Payer: Self-pay | Admitting: Radiation Oncology

## 2023-06-12 ENCOUNTER — Inpatient Hospital Stay (HOSPITAL_COMMUNITY)
Admission: EM | Disposition: A | Discharge status: Home Health | Payer: Self-pay | Source: Home / Self Care | Attending: Internal Medicine

## 2023-06-12 ENCOUNTER — Other Ambulatory Visit (HOSPITAL_COMMUNITY): Payer: Self-pay

## 2023-06-12 DIAGNOSIS — I2510 Atherosclerotic heart disease of native coronary artery without angina pectoris: Secondary | ICD-10-CM

## 2023-06-12 DIAGNOSIS — I480 Paroxysmal atrial fibrillation: Secondary | ICD-10-CM

## 2023-06-12 HISTORY — PX: RIGHT/LEFT HEART CATH AND CORONARY ANGIOGRAPHY: CATH118266

## 2023-06-12 LAB — BASIC METABOLIC PANEL
Anion gap: 8 (ref 5–15)
BUN: 9 mg/dL (ref 8–23)
CO2: 25 mmol/L (ref 22–32)
Calcium: 9.1 mg/dL (ref 8.9–10.3)
Chloride: 104 mmol/L (ref 98–111)
Creatinine, Ser: 0.81 mg/dL (ref 0.61–1.24)
GFR, Estimated: >60 mL/min (ref >60)
Glucose, Bld: 100 mg/dL — ABNORMAL HIGH (ref 70–99)
Potassium: 3.5 mmol/L (ref 3.5–5.1)
Sodium: 137 mmol/L (ref 135–145)

## 2023-06-12 LAB — CBC
HCT: 37.3 % — ABNORMAL LOW (ref 39.0–52.0)
Hemoglobin: 12.9 g/dL — ABNORMAL LOW (ref 13.0–17.0)
MCH: 33.7 pg (ref 26.0–34.0)
MCHC: 34.6 g/dL (ref 30.0–36.0)
MCV: 97.4 fL (ref 80.0–100.0)
Platelets: 195 10*3/uL (ref 150–400)
RBC: 3.83 MIL/uL — ABNORMAL LOW (ref 4.22–5.81)
RDW: 13.2 % (ref 11.5–15.5)
WBC: 7.9 10*3/uL (ref 4.0–10.5)
nRBC: 0 % (ref 0.0–0.2)

## 2023-06-12 LAB — HEPARIN LEVEL (UNFRACTIONATED): Heparin Unfractionated: 0.39 [IU]/mL (ref 0.30–0.70)

## 2023-06-12 SURGERY — RIGHT/LEFT HEART CATH AND CORONARY ANGIOGRAPHY
Anesthesia: LOCAL

## 2023-06-12 MED ORDER — SODIUM CHLORIDE 0.9 % IV SOLN: 250.0000 mL | INTRAVENOUS | Status: DC | PRN

## 2023-06-12 MED ORDER — APIXABAN 5 MG PO TABS: 5.0000 mg | ORAL_TABLET | Freq: Two times a day (BID) | ORAL | Status: DC

## 2023-06-12 MED ORDER — SODIUM CHLORIDE 0.9% FLUSH: 3.0000 mL | Freq: Two times a day (BID) | INTRAVENOUS | Status: DC

## 2023-06-12 MED ORDER — LIDOCAINE HCL (PF) 1 % IJ SOLN
INTRAMUSCULAR | Status: DC | PRN
  Administered 2023-06-12: 5 mL via INTRADERMAL

## 2023-06-12 MED ORDER — HEPARIN SODIUM (PORCINE) 1000 UNIT/ML IJ SOLN
INTRAMUSCULAR | Status: AC
  Filled 2023-06-12: qty 10

## 2023-06-12 MED ORDER — LABETALOL HCL 5 MG/ML IV SOLN
10.0000 mg | INTRAVENOUS | Status: AC | PRN
Start: 2023-06-12 — End: 2023-06-12

## 2023-06-12 MED ORDER — LIDOCAINE HCL 1 % IJ SOLN
INTRAMUSCULAR | Status: AC
  Filled 2023-06-12: qty 20

## 2023-06-12 MED ORDER — MIDAZOLAM HCL 5 MG/5ML IJ SOLN
INTRAMUSCULAR | Status: AC
  Filled 2023-06-12: qty 5

## 2023-06-12 MED ORDER — VERAPAMIL HCL 2.5 MG/ML IV SOLN
INTRAVENOUS | Status: AC
  Filled 2023-06-12: qty 2

## 2023-06-12 MED ORDER — SODIUM CHLORIDE 0.9% FLUSH: 3.0000 mL | INTRAVENOUS | Status: DC | PRN

## 2023-06-12 MED ORDER — FENTANYL CITRATE (PF) 100 MCG/2ML IJ SOLN
INTRAMUSCULAR | Status: DC | PRN
  Administered 2023-06-12: 25 ug via INTRAVENOUS

## 2023-06-12 MED ORDER — SODIUM CHLORIDE 0.9 % IV SOLN: INTRAVENOUS | Status: AC

## 2023-06-12 MED ORDER — HYDRALAZINE HCL 20 MG/ML IJ SOLN: 10.0000 mg | INTRAMUSCULAR | Status: AC | PRN

## 2023-06-12 MED ORDER — HEPARIN SODIUM (PORCINE) 1000 UNIT/ML IJ SOLN
INTRAMUSCULAR | Status: DC | PRN
  Administered 2023-06-12: 4000 [IU] via INTRAVENOUS

## 2023-06-12 MED ORDER — IOHEXOL 350 MG/ML SOLN
INTRAVENOUS | Status: DC | PRN
  Administered 2023-06-12: 25 mL

## 2023-06-12 MED ORDER — FENTANYL CITRATE (PF) 100 MCG/2ML IJ SOLN
INTRAMUSCULAR | Status: AC
  Filled 2023-06-12: qty 2

## 2023-06-12 MED ORDER — MIDAZOLAM HCL 2 MG/2ML IJ SOLN
INTRAMUSCULAR | Status: DC | PRN
  Administered 2023-06-12: 1 mg via INTRAVENOUS

## 2023-06-12 MED ORDER — HEPARIN (PORCINE) IN NACL 1000-0.9 UT/500ML-% IV SOLN
INTRAVENOUS | Status: DC | PRN
  Administered 2023-06-12 (×2): 500 mL

## 2023-06-12 SURGICAL SUPPLY — 10 items
CATH 5FR JL3.5 JR4 ANG PIG MP (CATHETERS) IMPLANT
DEVICE RAD COMP TR BAND LRG (VASCULAR PRODUCTS) IMPLANT
GLIDESHEATH SLEND SS 6F .021 (SHEATH) IMPLANT
GUIDEWIRE INQWIRE 1.5J.035X260 (WIRE) IMPLANT
INQWIRE 1.5J .035X260CM (WIRE) ×1
KIT HEART LEFT (KITS) IMPLANT
PACK CARDIAC CATHETERIZATION (CUSTOM PROCEDURE TRAY) ×1 IMPLANT
TRANSDUCER W/STOPCOCK (MISCELLANEOUS) IMPLANT
TUBING CIL FLEX 10 FLL-RA (TUBING) IMPLANT
WIRE HI TORQ VERSACORE-J 145CM (WIRE) IMPLANT

## 2023-06-12 NOTE — Progress Notes (Signed)
Transition of Care [facility]) - Inpatient Brief Assessment   Patient Details  Name: Julian Brown MRN: [id number] Date of Birth: 10/13/1932  Transition of Care [facility] CM/SW Contact:    Ronny Bacon, RN Phone Number: 06/12/2023, 3:49 PM   Clinical Narrative:  Patient from home with family with c/o increased heart rate and chest pain. Patient on IV heparin, scheduled for Cath 06/12/2023.  Transition of Care Asessment: Insurance and Status: (P) Insurance coverage has been reviewed Patient has primary care physician: (P) Yes Home environment has been reviewed: (P) House with family Prior level of function:: (P) independent Prior/Current Home Services: (P) No current home services Social Drivers of Health Review: (P) SDOH reviewed no interventions necessary Readmission risk has been reviewed: (P) Yes Transition of care needs: (P) no transition of care needs at this time

## 2023-06-12 NOTE — H&P (View-Only) (Signed)
   Patient Name: Julian Brown Date of Encounter: 06/12/2023 [facility] Cardiologist: None   Interval Summary  .    No complaints this morning. Family at the bedside.   Vital Signs .    Vitals:   06/11/23 1626 06/11/23 1700 06/11/23 1951 06/12/23 0611  BP: (!) 108/56 125/67 (!) 106/52 (!) 135/57  Pulse: 78   62  Resp: 18  17 18   Temp: 97.7 F (36.5 C)  99.3 F (37.4 C) 97.8 F (36.6 C)  TempSrc: Oral  Oral Oral  SpO2: 94%   97%  Weight:    68.3 kg  Height:        Intake/Output Summary (Last 24 hours) at 06/12/2023 0910 Last data filed at 06/12/2023 0700 Gross per 24 hour  Intake 1420.03 ml  Output 800 ml  Net 620.03 ml      06/12/2023    6:11 AM 06/09/2023   11:24 AM 06/09/2023   12:09 AM  Last 3 Weights  Weight (lbs) 150 lb 9.2 oz 156 lb 8 oz 158 lb  Weight (kg) 68.3 kg 70.988 kg 71.668 kg      Telemetry/ECG    Sinus bradycardia, rates 50-60s - Personally Reviewed  Physical Exam .   GEN: No acute distress.   Neck: No JVD Cardiac: RRR, no murmurs, rubs, or gallops.  Respiratory: Clear to auscultation bilaterally. GI: Soft, nontender, non-distended  MS: No edema  Assessment & Plan .     89 y.o. male with COPD, malignant neoplasm of the supraglottis status, dysphagia post partial mass removal as well as radiation, hypertension, hyperlipidemia intolerant to (rosuvastatin, simvastatin), prostate cancer who was seen 06/09/2023 for the evaluation of elevated troponins.   NSTEMI Tachyarrhythmia -- hsTn 1868>>2131. Initial EKG on admission with subtle ST depression with elevated rates, resolved with improved HR -- Echo showed LVEF of 40-45%, global hypokinesis, normal RV -- planned for cardiac cath today for definitive cardiac cath -- continue ASA, IV heparin, coreg 3.125mg  BID   Informed Consent   Shared Decision Making/Informed Consent The risks [stroke (1 in 1000), death (1 in 1000), kidney failure [usually temporary] (1 in 500), bleeding  (1 in 200), allergic reaction [possibly serious] (1 in 200)], benefits (diagnostic support and management of coronary artery disease) and alternatives of a cardiac catheterization were discussed in detail with Mr. Sheu and he is willing to proceed.  Acute HFrEF -- echo showed LVEF 40-45%, global hypokinesis, normal RV -- GDMT: coreg 3.125mg  BID, can consider addition of ARB post cath  Malignant neoplasm of supraglottis stage III Hoarseness -- Reportedly SCC -- Follows oncology, reportedly has no evidence of recurrence.  He does have some dysphagia but does exercises at home.   OSA? COPD -- Previous pulmonary hypertension noted on other imaging modalities and has nocturnal hypoxemia noted on chart.   -- Likely needs outpatient sleep study.  Continue inhalers.  Hyperlipidemia -- LDL 107 -- on crestor 10mg  daily   Left wrist pain Gout -- placed on colchicine yesterday     For questions or updates, please contact [facility] Please consult [email or web address] for contact info under        Signed, Laverda Page, NP

## 2023-06-12 NOTE — Telephone Encounter (Signed)

## 2023-06-12 NOTE — Care Management Important Message (Signed)
Important Message  Patient Details  Name: Julian Brown MRN: [id number] Date of Birth: Oct 08, 1932   Important Message Given:  Yes - Medicare IM     Renie Ora 06/12/2023, 3:12 PM

## 2023-06-12 NOTE — Interval H&P Note (Signed)
History and Physical Interval Note:  06/12/2023 2:33 PM  Julian Brown  has presented today for surgery, with the diagnosis of NSTEMI.  The various methods of treatment have been discussed with the patient and family. After consideration of risks, benefits and other options for treatment, the patient has consented to  Procedure(s): RIGHT/LEFT HEART CATH AND CORONARY ANGIOGRAPHY (N/A) as a surgical intervention.  The patient's history has been reviewed, patient examined, no change in status, stable for surgery.  I have reviewed the patient's chart and labs.  Questions were answered to the patient's satisfaction.     Tonny Bollman

## 2023-06-12 NOTE — Progress Notes (Signed)
PT Cancellation Note  Patient Details Name: Julian Brown MRN: [id number] DOB: 1932-11-14   Cancelled Treatment:    Reason Eval/Treat Not Completed: Other (comment). Pt still with TR band on after cath. Will evaluate in AM.    Angelina Ok [facility] 06/12/2023, 4:57 PM Skip Mayer PT Acute Rehabilitation Services Office [phone]

## 2023-06-12 NOTE — Progress Notes (Signed)
ANTICOAGULATION CONSULT NOTE  Pharmacy Consult for heparin Indication: chest pain/ACS  Allergies  Allergen Reactions   Colesevelam Other (See Comments)    increases TG, constipation   Fenofibrate Other (See Comments)     muscle aches   Rosuvastatin Other (See Comments)    Myalgias   Simvastatin Other (See Comments)    Myalgias   Amoxicillin-Pot Clavulanate Rash   Penicillins Swelling    Patient Measurements: Height: 5\' 9"  (175.3 cm) Weight: 68.3 kg (150 lb 9.2 oz) IBW/kg (Calculated) : 70.7 Heparin Dosing Weight: 71.7 kg  Vital Signs: Temp: 97.8 F (36.6 C) (12/30 0611) Temp Source: Oral (12/30 0611) BP: 135/57 (12/30 0611) Pulse Rate: 55 (12/30 0916)  Labs: Recent Labs    06/10/23 0626 06/10/23 0859 06/11/23 0319 06/11/23 0744 06/11/23 1546 06/12/23 0403 06/12/23 0945  HGB 13.3  --  13.9  --   --  12.9*  --   HCT 39.7  --  40.8  --   --  37.3*  --   PLT 177  --  198  --   --  195  --   HEPARINUNFRC  --    < >  --  0.37 0.39  --  0.39  CREATININE 0.80  --  0.85  --   --  0.81  --    < > = values in this interval not displayed.    Estimated Creatinine Clearance: 60.9 mL/min (by C-G formula based on SCr of 0.81 mg/dL).  Medical History: Past Medical History:  Diagnosis Date   Acute gangrenous cholecystitis s/p lap cholecystectomy 06/08/2017 06/08/2017   Arthritis    Colon cancer [facility])    ED (erectile dysfunction)    Emphysema of lung (HCC)    Hyperlipemia    Hypertension    Prostate cancer (HCC)    prostate cancer-tx radiation   Wears glasses     Medications:  Infusions:   sodium chloride     heparin 1,150 Units/hr (06/11/23 2233)    Assessment: 89 yo male presents with chest pain and elevated troponins.  Not on anticoagulation prior to admission. Pharmacy consulted for heparin dosing.   Heparin level 0.39 therapeutic on heparin drip rate 1150 units/hr. No issues with infusion running, patient c/o blood tinged sputum - will monitor- CBC stable  (Hgb 13.9, PLT 198).     Goal of Therapy:  Heparin level 0.3-0.7 units/ml Monitor platelets by anticoagulation protocol: Yes   Plan:  Continue heparin infusion at 1150 units/hr  Monitor heparin level, CBC, and signs of bleeding daily. F/u plans for LHC   [employer] Pharm.D. CPP, BCPS Clinical Pharmacist [phone] 06/12/2023 11:11 AM   **Pharmacist phone directory can be found on [email or web address] listed under [facility]**

## 2023-06-12 NOTE — Progress Notes (Signed)
   Patient Name: Julian Brown Date of Encounter: 06/12/2023 [facility] Cardiologist: None   Interval Summary  .    No complaints this morning. Family at the bedside.   Vital Signs .    Vitals:   06/11/23 1626 06/11/23 1700 06/11/23 1951 06/12/23 0611  BP: (!) 108/56 125/67 (!) 106/52 (!) 135/57  Pulse: 78   62  Resp: 18  17 18   Temp: 97.7 F (36.5 C)  99.3 F (37.4 C) 97.8 F (36.6 C)  TempSrc: Oral  Oral Oral  SpO2: 94%   97%  Weight:    68.3 kg  Height:        Intake/Output Summary (Last 24 hours) at 06/12/2023 0910 Last data filed at 06/12/2023 0700 Gross per 24 hour  Intake 1420.03 ml  Output 800 ml  Net 620.03 ml      06/12/2023    6:11 AM 06/09/2023   11:24 AM 06/09/2023   12:09 AM  Last 3 Weights  Weight (lbs) 150 lb 9.2 oz 156 lb 8 oz 158 lb  Weight (kg) 68.3 kg 70.988 kg 71.668 kg      Telemetry/ECG    Sinus bradycardia, rates 50-60s - Personally Reviewed  Physical Exam .   GEN: No acute distress.   Neck: No JVD Cardiac: RRR, no murmurs, rubs, or gallops.  Respiratory: Clear to auscultation bilaterally. GI: Soft, nontender, non-distended  MS: No edema  Assessment & Plan .     89 y.o. male with COPD, malignant neoplasm of the supraglottis status, dysphagia post partial mass removal as well as radiation, hypertension, hyperlipidemia intolerant to (rosuvastatin, simvastatin), prostate cancer who was seen 06/09/2023 for the evaluation of elevated troponins.   NSTEMI Tachyarrhythmia -- hsTn 1868>>2131. Initial EKG on admission with subtle ST depression with elevated rates, resolved with improved HR -- Echo showed LVEF of 40-45%, global hypokinesis, normal RV -- planned for cardiac cath today for definitive cardiac cath -- continue ASA, IV heparin, coreg 3.125mg  BID   Informed Consent   Shared Decision Making/Informed Consent The risks [stroke (1 in 1000), death (1 in 1000), kidney failure [usually temporary] (1 in 500), bleeding  (1 in 200), allergic reaction [possibly serious] (1 in 200)], benefits (diagnostic support and management of coronary artery disease) and alternatives of a cardiac catheterization were discussed in detail with Mr. Sheu and he is willing to proceed.  Acute HFrEF -- echo showed LVEF 40-45%, global hypokinesis, normal RV -- GDMT: coreg 3.125mg  BID, can consider addition of ARB post cath  Malignant neoplasm of supraglottis stage III Hoarseness -- Reportedly SCC -- Follows oncology, reportedly has no evidence of recurrence.  He does have some dysphagia but does exercises at home.   OSA? COPD -- Previous pulmonary hypertension noted on other imaging modalities and has nocturnal hypoxemia noted on chart.   -- Likely needs outpatient sleep study.  Continue inhalers.  Hyperlipidemia -- LDL 107 -- on crestor 10mg  daily   Left wrist pain Gout -- placed on colchicine yesterday     For questions or updates, please contact [facility] Please consult [email or web address] for contact info under        Signed, Laverda Page, NP

## 2023-06-12 NOTE — Plan of Care (Signed)
  Problem: Clinical Measurements: Goal: Respiratory complications will improve Outcome: Progressing Goal: Cardiovascular complication will be avoided Outcome: Progressing   Problem: Activity: Goal: Risk for activity intolerance will decrease Outcome: Progressing   Problem: Safety: Goal: Ability to remain free from injury will improve Outcome: Progressing   Problem: Activity: Goal: Ability to tolerate increased activity will improve Outcome: Progressing   Problem: Cardiac: Goal: Ability to achieve and maintain adequate cardiovascular perfusion will improve Outcome: Progressing

## 2023-06-13 ENCOUNTER — Other Ambulatory Visit (HOSPITAL_COMMUNITY): Payer: Self-pay

## 2023-06-13 ENCOUNTER — Encounter (HOSPITAL_COMMUNITY): Payer: Self-pay | Admitting: Cardiovascular Disease

## 2023-06-13 ENCOUNTER — Other Ambulatory Visit: Payer: Self-pay | Admitting: Cardiology

## 2023-06-13 ENCOUNTER — Encounter: Payer: Self-pay | Admitting: Radiation Oncology

## 2023-06-13 DIAGNOSIS — I480 Paroxysmal atrial fibrillation: Secondary | ICD-10-CM | POA: Diagnosis not present

## 2023-06-13 DIAGNOSIS — I4892 Unspecified atrial flutter: Secondary | ICD-10-CM

## 2023-06-13 DIAGNOSIS — R7989 Other specified abnormal findings of blood chemistry: Secondary | ICD-10-CM | POA: Diagnosis not present

## 2023-06-13 DIAGNOSIS — I5021 Acute systolic (congestive) heart failure: Secondary | ICD-10-CM | POA: Insufficient documentation

## 2023-06-13 LAB — CBC
HCT: 37 % — ABNORMAL LOW (ref 39.0–52.0)
Hemoglobin: 12.6 g/dL — ABNORMAL LOW (ref 13.0–17.0)
MCH: 33.7 pg (ref 26.0–34.0)
MCHC: 34.1 g/dL (ref 30.0–36.0)
MCV: 98.9 fL (ref 80.0–100.0)
Platelets: 184 10*3/uL (ref 150–400)
RBC: 3.74 MIL/uL — ABNORMAL LOW (ref 4.22–5.81)
RDW: 13.4 % (ref 11.5–15.5)
WBC: 4.9 10*3/uL (ref 4.0–10.5)
nRBC: 0 % (ref 0.0–0.2)

## 2023-06-13 LAB — BASIC METABOLIC PANEL
Anion gap: 5 (ref 5–15)
BUN: 13 mg/dL (ref 8–23)
CO2: 25 mmol/L (ref 22–32)
Calcium: 9.1 mg/dL (ref 8.9–10.3)
Chloride: 108 mmol/L (ref 98–111)
Creatinine, Ser: 0.84 mg/dL (ref 0.61–1.24)
GFR, Estimated: >60 mL/min (ref >60)
Glucose, Bld: 87 mg/dL (ref 70–99)
Potassium: 3.9 mmol/L (ref 3.5–5.1)
Sodium: 138 mmol/L (ref 135–145)

## 2023-06-13 MED ORDER — LOSARTAN POTASSIUM 25 MG PO TABS
12.5000 mg | ORAL_TABLET | Freq: Every day | ORAL | 0 refills | Status: DC
  Filled 2023-06-13: qty 45, 90d supply, fill #0

## 2023-06-13 MED ORDER — CARVEDILOL 3.125 MG PO TABS
3.1250 mg | ORAL_TABLET | Freq: Two times a day (BID) | ORAL | 1 refills | Status: DC
  Filled 2023-06-13: qty 180, 90d supply, fill #0

## 2023-06-13 MED ORDER — ASPIRIN 81 MG PO CHEW
81.0000 mg | CHEWABLE_TABLET | Freq: Every day | ORAL | 0 refills | Status: DC
  Filled 2023-06-13: qty 90, 90d supply, fill #0

## 2023-06-13 MED ORDER — ASPIRIN 81 MG PO CHEW
81.0000 mg | CHEWABLE_TABLET | Freq: Every day | ORAL | Status: DC
  Administered 2023-06-13: 81 mg via ORAL
  Filled 2023-06-13: qty 1

## 2023-06-13 MED ORDER — LOSARTAN POTASSIUM 25 MG PO TABS
12.5000 mg | ORAL_TABLET | Freq: Every day | ORAL | Status: DC
  Administered 2023-06-13: 12.5 mg via ORAL
  Filled 2023-06-13: qty 1

## 2023-06-13 MED ORDER — ROSUVASTATIN CALCIUM 10 MG PO TABS
10.0000 mg | ORAL_TABLET | Freq: Every day | ORAL | 1 refills | Status: DC
  Filled 2023-06-13: qty 90, 90d supply, fill #0

## 2023-06-13 MED FILL — Midazolam HCl Inj 5 MG/5ML (Base Equivalent): INTRAMUSCULAR | Qty: 1 | Status: AC

## 2023-06-13 NOTE — TOC Initial Note (Signed)
 Transition of Care [facility]) - Initial/Assessment Note    Patient Details  Name: Julian Brown MRN: [id number] Date of Birth: 02/26/1933  Transition of Care [facility]) CM/SW Contact:    Sudie Erminio Deems, RN Phone Number: 06/13/2023, 12:57 PM  Clinical Narrative: Plan will be for the patient to transition home today via private vehicle. Case Manager discussed home care needs with the patient and daughter Olam. Both are agreeable to services and the first choice is [facility] and they do not have staffing available. Case Manager then called second choice [facility] and they can accept the patient. Office to call the daughter with visit times. DME ordered via [employer] and will be delivered to the room. No further needs identified at this time.                  Expected Discharge Plan: Home w Home Health Services Barriers to Discharge: No Barriers Identified   Patient Goals and CMS Choice Patient states their goals for this hospitalization and ongoing recovery are:: to return home with family support   Choice offered to / list presented to : Patient, Adult Children (Chose [facility])    Expected Discharge Plan and Services   Discharge Planning Services: CM Consult Post Acute Care Choice: Home Health Living arrangements for the past 2 months: Single Family Home Expected Discharge Date: 06/13/23               DME Arranged: Vannie rolling with seat, Bedside commode DME Agency: [employer] Date DME Agency Contacted: 06/13/23 Time DME Agency Contacted: 1230 Representative spoke with at DME Agency: [place]. HH Arranged: PT, OT HH Agency: [facility] Date HH Agency Contacted: 06/13/23 Time HH Agency Contacted: 1239 Representative spoke with at [facility] Agency: Arna  Prior Living Arrangements/Services Living arrangements for the past 2 months: Single Family Home Lives with:: Spouse Patient language and need for interpreter reviewed:: Yes         Need for Family Participation in Patient Care: Yes (Comment) Care giver support system in place?: Yes (comment)   Criminal Activity/Legal Involvement Pertinent to Current Situation/Hospitalization: No - Comment as needed  Activities of Daily Living   ADL Screening (condition at time of admission) Independently performs ADLs?: Yes (appropriate for developmental age) Is the patient deaf or have difficulty hearing?: No Does the patient have difficulty seeing, even when wearing glasses/contacts?: No Does the patient have difficulty concentrating, remembering, or making decisions?: No  Permission Sought/Granted Permission sought to share information with : Case Manager, Magazine Features Editor, Family Supports       Permission granted to share info w AGENCY: [facility]        Emotional Assessment Appearance:: Appears stated age Attitude/Demeanor/Rapport: Engaged Affect (typically observed): Appropriate Orientation: : Oriented to Self, Oriented to Place, Oriented to  Time Alcohol / Substance Use: Not Applicable Psych Involvement: No (comment)  Admission diagnosis:  ACS (acute coronary syndrome) (HCC) [I24.9] NSTEMI (non-ST elevated myocardial infarction) (HCC) [I21.4] Atrial fibrillation/flutter (HCC) [I48.91, I48.92] Patient Active Problem List   Diagnosis Date Noted   Acute heart failure with mildly reduced ejection fraction (HFmrEF, 41-49%) (HCC) 06/13/2023   Paroxysmal atrial fibrillation (HCC) 06/13/2023   ACS (acute coronary syndrome) (HCC) 06/09/2023   NSTEMI (non-ST elevated myocardial infarction) (HCC) 06/09/2023   Malignant neoplasm of supraglottis (HCC) 08/08/2019   Hoarseness of voice 06/12/2019   Cough 05/23/2019   Primary osteoarthritis of right wrist 07/31/2018   Stage 2 moderate COPD by  GOLD classification (HCC) 05/16/2018   Allergic rhinitis 05/16/2018   Nocturnal hypoxemia 05/16/2018   Abnormal finding on lung imaging 05/16/2018    Hypertension    Hyperlipemia    Acute gangrenous cholecystitis s/p lap cholecystectomy 06/08/2017 06/08/2017   Pain in right wrist 01/04/2017   Chronic pain of left knee 01/04/2017   Chronic pain of right knee 01/04/2017   Unilateral primary osteoarthritis, left knee 01/04/2017   Unilateral primary osteoarthritis, right knee 01/04/2017   PCP:  Janey Santos, MD Pharmacy:   [facility] [place], [place] - [address] [place] [address] Phone: [phone] Fax: [phone]  DARRYLE LONG - [facility] [address] [place] [address] Phone: [phone] Fax: [phone]  [facility] [address] [place] [address] Phone: [phone] Fax: [phone]  Social Drivers of Health (SDOH) Social History: SDOH Screenings   Food Insecurity: No Food Insecurity (06/09/2023)  Housing: Low Risk  (06/09/2023)  Transportation Needs: No Transportation Needs (06/09/2023)  Utilities: Not At Risk (06/09/2023)  Social Connections: Moderately Isolated (06/12/2023)  Tobacco Use: Medium Risk (06/09/2023)   Readmission Risk Interventions     No data to display

## 2023-06-13 NOTE — Progress Notes (Signed)
 Heart Failure Navigator Progress Note  Assessed for Heart & Vascular TOC clinic readiness.  Patient EF 40-45%, has a scheduled [facility] appointment on 06/20/2023. .   Navigator will sign off at this time. SABRA Stephane Haddock, BSN, RN Heart Failure Teacher, Adult Education Only

## 2023-06-13 NOTE — Progress Notes (Signed)
 CARDIAC REHAB PHASE I   PRE:  Rate/Rhythm: Sinus Rhythm 95   BP:  Supine: 158/80     SaO2: 97% 2l/min  MODE:  Ambulation: attempted to get out of bed with rolling walker. Difficult to stand. Physical therapist to come and evaluate.  [id number] Patient and daughter were  given MI booklet. Reviewed heart healthy diet with the patient. Patient and daughter says Julian Brown  may be interested in participating in outpatient cardiac rehab in [place]. Will place referral.  Hadassah Elpidio Quan RN

## 2023-06-13 NOTE — Progress Notes (Addendum)
 Discharge instructions reviewed with pt and his daughter.  Copy of instructions given to pt. [facility] has filled new scripts for pt and will be picked up on the way out for discharge. Handouts provided on new medications.  Questions answered. Pt getting dressed and pt's DME to be arriving to room in next few minutes per CM.  Pt will be d/c'd via wheelchair with belongings, with his daughter  and will be escorted by staff.   Madelin Barefoot, RN SWOT

## 2023-06-13 NOTE — Evaluation (Signed)
 Physical Therapy Evaluation Patient Details Name: Julian Brown MRN: [id number] DOB: [birth year] Today's Date: 06/13/2023  History of Present Illness  Pt is 89 year old presented to [facility] on  06/09/23 for chest pain and tachycardia. Pt with NSTEMI and afib. PMH - laryngeal CA, copd, prostate CA, colon CA, HTN  Clinical Impression  Pt admitted with above diagnosis and presents to PT with functional limitations due to deficits listed below (See PT problem list). Pt needs skilled PT to maximize independence and safety. Pt able to amb in hallways with supervision and use of rollator/rolling walker. Improved stability and confidence as he was on his feet more. Primarily needed a little assist to get out of bed and stand up due to limiting use of rt hand/wrist with radial cath <24 hours ago. Expect he will return to independence with transfers and household mobility within a few days of returning home. Daughters can provide assist for several days at home prior to returning to work which I think will be adequate. Follow up HHPT and HHOT recommended.           If plan is discharge home, recommend the following: A little help with walking and/or transfers;A little help with bathing/dressing/bathroom;Assistance with cooking/housework;Assist for transportation   Can travel by private [employer] (4 wheels);BSC/3in1  Recommendations for Other Services       Functional Status Assessment Patient has had a recent decline in their functional status and demonstrates the ability to make significant improvements in function in a reasonable and predictable amount of time.     Precautions / Restrictions Precautions Precautions: Fall;Other (comment) Precaution Comments: radial cath <24 hours prior Restrictions Other Position/Activity Restrictions: Minimize pushing, pulling with RUE due to radial cath <24 hrs agoa      Mobility  Bed Mobility Overal bed mobility:  Needs Assistance Bed Mobility: Supine to Sit     Supine to sit: Min assist, HOB elevated     General bed mobility comments: Assist to elevate trunk into sitting and bring hips to EOB due to limited use of rt hand to push    Transfers Overall transfer level: Needs assistance Equipment used: Rolling walker (2 wheels), None, Rollator (4 wheels) Transfers: Sit to/from Stand Sit to Stand: Min assist, Contact guard assist, From elevated surface           General transfer comment: Assist to bring hips up due to limited pushing with RUE. Min assist from chair. CGA from elevated bed    Ambulation/Gait Ambulation/Gait assistance: Contact guard assist, Supervision Gait Distance (Feet): 350 Feet Assistive device: Rolling walker (2 wheels), Rollator (4 wheels), None Gait Pattern/deviations: Step-through pattern, Decreased stride length Gait velocity: decr Gait velocity interpretation: 1.31 - 2.62 ft/sec, indicative of limited community ambulator   General Gait Details: Pt with improving stability and confidence as distance increased.  Stairs            Wheelchair Mobility     Tilt Bed    Modified Rankin (Stroke Patients Only)       Balance Overall balance assessment: Needs assistance Sitting-balance support: No upper extremity supported, Feet supported Sitting balance-Leahy Scale: Normal     Standing balance support: No upper extremity supported Standing balance-Leahy Scale: Fair                               Pertinent Vitals/Pain Pain Assessment Pain Assessment: No/denies  pain    Home Living Family/patient expects to be discharged to:: Private residence Living Arrangements: Spouse/significant other Available Help at Discharge: Family;Available 24 hours/day Type of Home: House Home Access: Level entry       Home Layout: One level Home Equipment: Cane - single point      Prior Function Prior Level of Function : Independent/Modified  Independent;Driving             Mobility Comments: No assistive device. Wife assist pt up from low commode       Extremity/Trunk Assessment   Upper Extremity Assessment Upper Extremity Assessment: Generalized weakness    Lower Extremity Assessment Lower Extremity Assessment: Generalized weakness       Communication   Communication Communication: Hearing impairment  Cognition Arousal: Alert Behavior During Therapy: WFL for tasks assessed/performed Overall Cognitive Status: Within Functional Limits for tasks assessed                                          General Comments General comments (skin integrity, edema, etc.): VSS on RA. SpO2 90% after amb. HR high 70's with mobility    Exercises     Assessment/Plan    PT Assessment Patient needs continued PT services  PT Problem List Decreased strength;Decreased balance;Decreased mobility       PT Treatment Interventions DME instruction;Therapeutic activities;Gait training;Therapeutic exercise;Patient/family education;Balance training;Functional mobility training    PT Goals (Current goals can be found in the Care Plan section)  Acute Rehab PT Goals Patient Stated Goal: go home PT Goal Formulation: With patient Time For Goal Achievement: 06/20/23 Potential to Achieve Goals: Good    Frequency Min 1X/week     Co-evaluation               AM-PAC PT 6 Clicks Mobility  Outcome Measure Help needed turning from your back to your side while in a flat bed without using bedrails?: A Little Help needed moving from lying on your back to sitting on the side of a flat bed without using bedrails?: A Little Help needed moving to and from a bed to a chair (including a wheelchair)?: A Little Help needed standing up from a chair using your arms (e.g., wheelchair or bedside chair)?: A Little Help needed to walk in hospital room?: A Little Help needed climbing 3-5 steps with a railing? : A Little 6 Click  Score: 18    End of Session Equipment Utilized During Treatment: Gait belt Activity Tolerance: Patient tolerated treatment well Patient left: in chair;with call bell/phone within reach;with chair alarm set;with family/visitor present Nurse Communication: Mobility status PT Visit Diagnosis: Unsteadiness on feet (R26.81);Muscle weakness (generalized) (M62.81);Other abnormalities of gait and mobility (R26.89)    Time: 8890-8856 PT Time Calculation (min) (ACUTE ONLY): 34 min   Charges:   PT Evaluation $PT Eval Moderate Complexity: 1 Mod PT Treatments $Gait Training: 8-22 mins PT General Charges $$ ACUTE PT VISIT: 1 Visit         [facility] PT Acute Rehabilitation Services Office [phone]   Rodgers ORN [facility] 06/13/2023, 12:26 PM

## 2023-06-13 NOTE — Plan of Care (Signed)
  Problem: Education: Goal: Knowledge of General Education information will improve Description: Including pain rating scale, medication(s)/side effects and non-pharmacologic comfort measures Outcome: Progressing   Problem: Clinical Measurements: Goal: Ability to maintain clinical measurements within normal limits will improve Outcome: Progressing   Problem: Clinical Measurements: Goal: Diagnostic test results will improve Outcome: Progressing   Problem: Activity: Goal: Risk for activity intolerance will decrease Outcome: Progressing   Problem: Nutrition: Goal: Adequate nutrition will be maintained Outcome: Progressing   

## 2023-06-13 NOTE — Discharge Summary (Signed)
 Discharge Summary    Patient ID: [name] MRN: [id number]; DOB: 11/03/1932  Admit date: 06/09/2023 Discharge date: 06/13/2023  PCP:  Janey Santos, MD   [facility] Providers Cardiologist:  [name], MD       Discharge Diagnoses    Principal Problem:   ACS (acute coronary syndrome) [facility]) Active Problems:   Hypertension   Hyperlipemia   NSTEMI (non-ST elevated myocardial infarction) (HCC)   Acute heart failure with mildly reduced ejection fraction (HFmrEF, 41-49%) (HCC)   Paroxysmal atrial fibrillation [facility])   Diagnostic Studies/Procedures    Cath: 06/12/2023    Ost RCA to Prox RCA lesion is 30% stenosed.   Mid RCA lesion is 40% stenosed.   Prox Cx lesion is 60% stenosed.   Patent left main with no stenosis Patent LAD with no stenosis Moderate stenosis of the proximal circumflex, estimated at 60-70%, appropriate for medical therapy Patent RCA (dominant vessel) with mild nonobstructive plaquing (30% proximal and 40% mid-vessel stenosis) Normal LVEDP   Recommendations: start apixaban  tomorrow morning for new atrial fibrillation. Med Rx for CAD. _____________   History of Present Illness     Julian Brown is a 89 y.o. male with  COPD, malignant neoplasm of the supraglottis status, dysphagia post partial mass removal as well as radiation, hypertension, hyperlipidemia intolerant to (rosuvastatin , simvastatin), prostate cancer who was 06/09/2023 for the evaluation of elevated troponins.   Julian Brown has no prior cardiac history.  Reported mother who had a heart attack of unknown age.  Not a known diabetic.  Used to be a smoker with over 20 years pack-year history however has quit over 40 years ago but has COPD.  No drugs, rarely drinks.  Reportedly had a normal nuclear stress test in 2017.   At home on his blood pressure cuff he was reported to have had elevated heart rates in the 130s, family doctor was called and recommended visit to the ED.   Reportedly in the ED he had EKG and was difficult to discern what kind of SVT it could be due to wandering baseline.  He was given 5 mg of IV Lopressor  which reportedly showed atrial flutter and reportedly fibrillation but spontaneously converted back to NSR with frequent PACs and PVCs.  He had troponins drawn that were elevated at 1800+ then 2100+.  Negative CTA for PE.  He was started on IV heparin  and loaded with aspirin .   Overall patient was asymptomatic and very functional at home and reports walking frequently without any complaints of chest pain, shortness of breath, peripheral edema, palpitations, dizziness, syncope, falls, orthopnea.  In the ER he complained of some chest tightness however in talking with him now he denies this.  Additionally, although patient does respond to questions appropriately he was little bit slow to respond and seems confused at times.  He lives at home with his wife, reports no kids.   Admitted to cardiology for further management.   Hospital Course      NSTEMI (type II) -- hsTn [id number]. Initial EKG on admission with subtle ST depression with elevated rates, resolved with improved HR -- Echo showed LVEF of 40-45%, global hypokinesis, normal RV -- underwent cardiac cath 12/30 with pLcx 60%, mRCA 40%, pRCA 30%, recommendations for medical management. -- continue ASA (would stop if Eliquis  started as an outpatient), coreg  3.125mg  BID, statin    Acute HFrEF NICM, suspect tachy-mediated  -- echo showed LVEF 40-45%, global hypokinesis, normal RV.  -- GDMT: coreg  3.125mg   BID, losartan  12.5mg  daily   Tachyarrhythmia pAF vs flutter -- initial EKG with possible atrial fibrillation vs flutter, initial plan was to place on Eliquis  but had episodes of hemoptysis -- tolerating low dose coreg , baseline bradycardia in the 50s therefore unable to titrate up further -- will hold eliquis  for now, plan for outpatient cardiac monitor -- can re-trial eliquis  as an  outpatient if hemoptysis clears   Malignant neoplasm of supraglottis stage III Hoarseness Hemoptysis  -- Reportedly SCC -- Follows oncology, reportedly has no evidence of recurrence.  He does have some dysphagia but does exercises at home. -- has had a few episodes of hemoptysis this admission while on IV heparin . None the morning of discharge    OSA? COPD -- Previous pulmonary hypertension noted on other imaging modalities and has nocturnal hypoxemia noted on chart.   -- Likely needs outpatient sleep study.   -- Continue inhalers.   Hyperlipidemia -- LDL 107 -- on crestor  10mg  daily    Left wrist pain Gout -- placed on colchicine  briefly, now resolved   HH PT/OT ordered at discharge.   Patient was seen by Dr. Elmira and deemed stable to discharge home. Follow up in the office arranged.    The patient will be scheduled for a TOC follow up appointment in 10-14 days.  A message has been sent to the [facility] and Scheduling Pool at the office where the patient should be seen for follow up.  _____________  Discharge Vitals Blood pressure 137/72, pulse 66, temperature 97.8 F (36.6 C), temperature source Oral, resp. rate 16, height 5' 9 (1.753 m), weight 68.3 kg, SpO2 97%.  Filed Weights   06/09/23 0009 06/09/23 1124 06/12/23 0611  Weight: 71.7 kg 71 kg 68.3 kg    Labs & Radiologic Studies    CBC Recent Labs    06/12/23 0403 06/13/23 0508  WBC 7.9 4.9  HGB 12.9* 12.6*  HCT 37.3* 37.0*  MCV 97.4 98.9  PLT 195 184   Basic Metabolic Panel Recent Labs    87/69/75 0403 06/13/23 0508  NA 137 138  K 3.5 3.9  CL 104 108  CO2 25 25  GLUCOSE 100* 87  BUN 9 13  CREATININE 0.81 0.84  CALCIUM  9.1 9.1   Liver Function Tests No results for input(s): AST, ALT, ALKPHOS, BILITOT, PROT, ALBUMIN in the last 72 hours. No results for input(s): LIPASE, AMYLASE in the last 72 hours. High Sensitivity Troponin:   Recent Labs  Lab 06/09/23 0017  06/09/23 0211  TROPONINIHS 1,868* 2,131*    BNP Invalid input(s): POCBNP D-Dimer No results for input(s): DDIMER in the last 72 hours. Hemoglobin A1C No results for input(s): HGBA1C in the last 72 hours. Fasting Lipid Panel No results for input(s): CHOL, HDL, LDLCALC, TRIG, CHOLHDL, LDLDIRECT in the last 72 hours. Thyroid  Function Tests No results for input(s): TSH, T4TOTAL, T3FREE, THYROIDAB in the last 72 hours.  Invalid input(s): FREET3 _____________  CARDIAC CATHETERIZATION Result Date: 06/12/2023   Ost RCA to Prox RCA lesion is 30% stenosed.   Mid RCA lesion is 40% stenosed.   Prox Cx lesion is 60% stenosed. Patent left main with no stenosis Patent LAD with no stenosis Moderate stenosis of the proximal circumflex, estimated at 60-70%, appropriate for medical therapy Patent RCA (dominant vessel) with mild nonobstructive plaquing (30% proximal and 40% mid-vessel stenosis) Normal LVEDP Recommendations: start apixaban  tomorrow morning for new atrial fibrillation. Med Rx for CAD.   ECHOCARDIOGRAM COMPLETE Result Date: 06/09/2023  ECHOCARDIOGRAM REPORT   Patient Name:   BRISTOL SOY [facility] Date of Exam: 06/09/2023 Medical Rec #:  [id number]       Height:       69.0 in Accession #:    [id number]      Weight:       156.5 lb Date of Birth:  May 15, 1933      BSA:          1.861 m Patient Age:    89 years        BP:           144/70 mmHg Patient Gender: M               HR:           82 bpm. Exam Location:  Inpatient Procedure: 2D Echo Indications:    nstemi  History:        Patient has no prior history of Echocardiogram examinations.  Sonographer:    Thea Norlander Referring Phys: [id number] ANDEE FLATTEN IMPRESSIONS  1. Left ventricular ejection fraction, by estimation, is 40 to 45%. The left ventricle has mildly decreased function. The left ventricle demonstrates global hypokinesis. The left ventricular internal cavity size was mildly dilated. Left ventricular diastolic  parameters are indeterminate.  2. Right ventricular systolic function is normal. The right ventricular size is normal.  3. Left atrial size was mildly dilated.  4. The mitral valve is normal in structure. Mild mitral valve regurgitation. No evidence of mitral stenosis.  5. The aortic valve is tricuspid. Aortic valve regurgitation is not visualized. Aortic valve sclerosis/calcification is present, without any evidence of aortic stenosis.  6. The inferior vena cava is dilated in size with >50% respiratory variability, suggesting right atrial pressure of 8 mmHg. Comparison(s): No prior Echocardiogram. FINDINGS  Left Ventricle: Left ventricular ejection fraction, by estimation, is 40 to 45%. The left ventricle has mildly decreased function. The left ventricle demonstrates global hypokinesis. The left ventricular internal cavity size was mildly dilated. There is  no left ventricular hypertrophy. Left ventricular diastolic parameters are indeterminate. Right Ventricle: The right ventricular size is normal. Right ventricular systolic function is normal. Left Atrium: Left atrial size was mildly dilated. Right Atrium: Right atrial size was normal in size. Pericardium: There is no evidence of pericardial effusion. Mitral Valve: The mitral valve is normal in structure. Mild mitral valve regurgitation. No evidence of mitral valve stenosis. Tricuspid Valve: The tricuspid valve is normal in structure. Tricuspid valve regurgitation is trivial. No evidence of tricuspid stenosis. Aortic Valve: The aortic valve is tricuspid. Aortic valve regurgitation is not visualized. Aortic valve sclerosis/calcification is present, without any evidence of aortic stenosis. Aortic valve peak gradient measures 7.8 mmHg. Pulmonic Valve: The pulmonic valve was normal in structure. Pulmonic valve regurgitation is mild. No evidence of pulmonic stenosis. Aorta: The aortic root is normal in size and structure. Venous: The inferior vena cava is dilated in  size with greater than 50% respiratory variability, suggesting right atrial pressure of 8 mmHg. IAS/Shunts: No atrial level shunt detected by color flow Doppler.  LEFT VENTRICLE PLAX 2D LVIDd:         5.40 cm     Diastology LVIDs:         4.40 cm     LV e' medial:    9.03 cm/s LV PW:         0.90 cm     LV E/e' medial:  7.0 LV IVS:        0.60  cm     LV e' lateral:   12.00 cm/s LVOT diam:     2.10 cm     LV E/e' lateral: 5.3 LV SV:         65 LV SV Index:   35 LVOT Area:     3.46 cm  LV Volumes (MOD) LV vol d, MOD A2C: 84.2 ml LV vol d, MOD A4C: 62.0 ml LV vol s, MOD A2C: 41.9 ml LV vol s, MOD A4C: 29.7 ml LV SV MOD A2C:     42.3 ml LV SV MOD A4C:     62.0 ml LV SV MOD BP:      39.1 ml RIGHT VENTRICLE             IVC RV S prime:     16.80 cm/s  IVC diam: 2.20 cm TAPSE (M-mode): 1.9 cm LEFT ATRIUM             Index        RIGHT ATRIUM           Index LA diam:        2.80 cm 1.50 cm/m   RA Area:     15.40 cm LA Vol (A2C):   74.3 ml 39.91 ml/m  RA Volume:   36.00 ml  19.34 ml/m LA Vol (A4C):   44.9 ml 24.12 ml/m LA Biplane Vol: 61.9 ml 33.25 ml/m  AORTIC VALVE AV Area (Vmax): 2.47 cm AV Vmax:        140.00 cm/s AV Peak Grad:   7.8 mmHg LVOT Vmax:      100.00 cm/s LVOT Vmean:     63.200 cm/s LVOT VTI:       0.188 m  AORTA Ao Root diam: 3.40 cm Ao Asc diam:  3.20 cm MITRAL VALVE MV Area (PHT): 2.62 cm    SHUNTS MV Decel Time: 289 msec    Systemic VTI:  0.19 m MR Peak grad: 28.4 mmHg    Systemic Diam: 2.10 cm MR Vmax:      266.50 cm/s MV E velocity: 63.20 cm/s MV A velocity: 46.40 cm/s MV E/A ratio:  1.36 Redell Shallow MD Electronically signed by Redell Shallow MD Signature Date/Time: 06/09/2023/3:55:35 PM    Final    CT Angio Chest PE W and/or Wo Contrast Result Date: 06/09/2023 CLINICAL DATA:  Pulmonary embolism (PE) suspected, high prob. Chest discomfort. EXAM: CT ANGIOGRAPHY CHEST WITH CONTRAST TECHNIQUE: Multidetector CT imaging of the chest was performed using the standard protocol during bolus  administration of intravenous contrast. Multiplanar CT image reconstructions and MIPs were obtained to evaluate the vascular anatomy. RADIATION DOSE REDUCTION: This exam was performed according to the departmental dose-optimization program which includes automated exposure control, adjustment of the mA and/or kV according to patient size and/or use of iterative reconstruction technique. CONTRAST:  75mL OMNIPAQUE  IOHEXOL  350 MG/ML SOLN COMPARISON:  06/01/2021 FINDINGS: Cardiovascular: No filling defects in the pulmonary arteries to suggest pulmonary emboli. Mild cardiomegaly. Aorta normal caliber. Moderate coronary artery and aortic atherosclerosis. Mediastinum/Nodes: No mediastinal, hilar, or axillary adenopathy. Trachea and esophagus are unremarkable. Thyroid  unremarkable. Lungs/Pleura: Left base atelectasis. Right middle lobe atelectasis. No additional confluent opacities or effusions. Upper Abdomen: Numerous cysts throughout the liver are again noted, unchanged. These are compatible with benign cysts. No acute findings. Musculoskeletal: Chest wall soft tissues are unremarkable. No acute bony abnormality. Review of the MIP images confirms the above findings. IMPRESSION: No evidence of pulmonary embolus. Mild cardiomegaly. Areas of atelectasis in the right  middle lobe and left lower lobe. Coronary artery disease. Aortic Atherosclerosis (ICD10-I70.0). Electronically Signed   By: Franky Crease M.D.   On: 06/09/2023 02:29   DG Chest Port 1 View Result Date: 06/09/2023 CLINICAL DATA:  Chest pain. EXAM: PORTABLE CHEST 1 VIEW COMPARISON:  June 15, 2017 FINDINGS: The cardiac silhouette is mildly enlarged and unchanged in size. Mild, diffuse, chronic appearing increased lung markings are noted. Very mild atelectasis is seen within the bilateral lung bases. There is no evidence of focal consolidation, pleural effusion or pneumothorax. Multilevel degenerative changes seen throughout the thoracic spine. IMPRESSION: Mild  cardiomegaly with very mild bibasilar atelectasis. Electronically Signed   By: Suzen Dials M.D.   On: 06/09/2023 01:00   Disposition   Pt is being discharged home today in good condition.  Follow-up Plans & Appointments     Discharge Instructions     AMB referral to Phase II Cardiac Rehabilitation   Complete by: As directed    Diagnosis: NSTEMI   After initial evaluation and assessments completed: Virtual Based Care may be provided alone or in conjunction with Phase 2 Cardiac Rehab based on patient barriers.: Yes   Intensive Cardiac Rehabilitation (ICR) [facility] location only OR Traditional Cardiac Rehabilitation (TCR) *If criteria for ICR are not met will enroll in TCR ([facility] only): Yes   Amb Referral to Cardiac Rehabilitation   Complete by: As directed    Diagnosis: NSTEMI   After initial evaluation and assessments completed: Virtual Based Care may be provided alone or in conjunction with Phase 2 Cardiac Rehab based on patient barriers.: Yes   Intensive Cardiac Rehabilitation (ICR) [facility] location only OR Traditional Cardiac Rehabilitation (TCR) *If criteria for ICR are not met will enroll in TCR [facility] only): Yes   Call MD for:  difficulty breathing, headache or visual disturbances   Complete by: As directed    Call MD for:  persistant dizziness or light-headedness   Complete by: As directed    Call MD for:  redness, tenderness, or signs of infection (pain, swelling, redness, odor or green/yellow discharge around incision site)   Complete by: As directed    Diet - low sodium heart healthy   Complete by: As directed    Discharge instructions   Complete by: As directed    Radial Site Care Refer to this sheet in the next few weeks. These instructions provide you with information on caring for yourself after your procedure. Your caregiver may also give you more specific instructions. Your treatment has been planned according to current medical practices, but problems sometimes occur. Call  your caregiver if you have any problems or questions after your procedure. HOME CARE INSTRUCTIONS You may shower the day after the procedure. Remove the bandage (dressing) and gently wash the site with plain soap and water. Gently pat the site dry.  Do not apply powder or lotion to the site.  Do not submerge the affected site in water for 3 to 5 days.  Inspect the site at least twice daily.  Do not flex or bend the affected arm for 24 hours.  No lifting over 5 pounds (2.3 kg) for 5 days after your procedure.  Do not drive home if you are discharged the same day of the procedure. Have someone else drive you.  You may drive 24 hours after the procedure unless otherwise instructed by your caregiver.  What to expect: Any bruising will usually fade within 1 to 2 weeks.  Blood that collects in the tissue (hematoma)  may be painful to the touch. It should usually decrease in size and tenderness within 1 to 2 weeks.  SEEK IMMEDIATE MEDICAL CARE IF: You have unusual pain at the radial site.  You have redness, warmth, swelling, or pain at the radial site.  You have drainage (other than a small amount of blood on the dressing).  You have chills.  You have a fever or persistent symptoms for more than 72 hours.  You have a fever and your symptoms suddenly get worse.  Your arm becomes pale, cool, tingly, or numb.  You have heavy bleeding from the site. Hold pressure on the site.   Face-to-face encounter (required for Medicare/Medicaid patients)   Complete by: As directed    I Manuelita Rummer certify that this patient is under my care and that I, or a nurse practitioner or physician's assistant working with me, had a face-to-face encounter that meets the physician face-to-face encounter requirements with this patient on 06/13/2023. The encounter with the patient was in whole, or in part for the following medical condition(s) which is the primary reason for home health care (List medical condition):  deconditioned, HFmrEF   The encounter with the patient was in whole, or in part, for the following medical condition, which is the primary reason for home health care: deconditioned, HFmrEF   I certify that, based on my findings, the following services are medically necessary home health services: Physical therapy   Reason for Medically Necessary Home Health Services: Other See Comments   My clinical findings support the need for the above services: OTHER SEE COMMENTS   Further, I certify that my clinical findings support that this patient is homebound due to: Ambulates short distances less than 300 feet   Home Health   Complete by: As directed    To provide the following care/treatments:  PT OT     Increase activity slowly   Complete by: As directed         Discharge Medications   Allergies as of 06/13/2023       Reactions   Colesevelam Other (See Comments)   increases TG, constipation   Fenofibrate Other (See Comments)    muscle aches   Rosuvastatin  Other (See Comments)   Myalgias   Simvastatin Other (See Comments)   Myalgias   Amoxicillin-pot Clavulanate Rash   Penicillins Swelling        Medication List     STOP taking these medications    amLODipine  5 MG tablet Commonly known as: NORVASC        TAKE these medications    aspirin  81 MG chewable tablet Chew 1 tablet (81 mg total) by mouth daily. Start taking on: June 14, 2023   carvedilol  3.125 MG tablet Commonly known as: COREG  Take 1 tablet (3.125 mg total) by mouth 2 (two) times daily with a meal.   losartan  25 MG tablet Commonly known as: COZAAR  Take 0.5 tablets (12.5 mg total) by mouth daily. Start taking on: June 14, 2023   PROBIOTIC PO Take 1 capsule by mouth daily.   rosuvastatin  10 MG tablet Commonly known as: CRESTOR  Take 1 tablet (10 mg total) by mouth daily. Start taking on: June 14, 2023   Spiriva  Respimat 1.25 MCG/ACT Aers Generic drug: Tiotropium Bromide  Monohydrate Inhale  2 puffs into the lungs daily.   VITAMIN D  PO Take 1 capsule by mouth daily.               Durable Medical Equipment  (From admission, onward)  Start     Ordered   06/13/23 1219  For home use only DME Bedside commode  Once       Comments: Confined to one area of the home.  Question:  Patient needs a bedside commode to treat with the following condition  Answer:  General weakness   06/13/23 1218   06/13/23 1218  For home use only DME 4 wheeled rolling walker with seat  Once       Comments: Acute coronary syndrome- needs additional walking aide with seat.  Question:  Patient needs a walker to treat with the following condition  Answer:  General weakness   06/13/23 1218               Outstanding Labs/Studies   BMET at follow up  Duration of Discharge Encounter   Greater than 30 minutes including physician time.  Signed, Manuelita Rummer, NP 06/13/2023, 12:21 PM

## 2023-06-13 NOTE — Progress Notes (Signed)
   Patient Name: Julian Brown Date of Encounter: 06/13/2023 [facility] Cardiologist: None   Interval Summary  .    Sitting up in bed, no complaints. Family at bedside.   Vital Signs .    Vitals:   06/12/23 1727 06/12/23 2006 06/13/23 0440 06/13/23 0753  BP:  (!) 121/53 (!) 149/69 137/72  Pulse: (!) 54 (!) 43 (!) 53 (!) 53  Resp:  16 16 16   Temp:  98.5 F (36.9 C) 97.6 F (36.4 C) 97.8 F (36.6 C)  TempSrc:  Oral Oral Oral  SpO2:  92% 98% 97%  Weight:      Height:        Intake/Output Summary (Last 24 hours) at 06/13/2023 0940 Last data filed at 06/13/2023 0523 Gross per 24 hour  Intake 180 ml  Output 775 ml  Net -595 ml      06/12/2023    6:11 AM 06/09/2023   11:24 AM 06/09/2023   12:09 AM  Last 3 Weights  Weight (lbs) 150 lb 9.2 oz 156 lb 8 oz 158 lb  Weight (kg) 68.3 kg 70.988 kg 71.668 kg      Telemetry/ECG    Sinus arrhythmia, bradycardia, PACs, PVCs - Personally Reviewed  Physical Exam .   GEN: No acute distress.   Neck: No JVD Cardiac: RRR, no murmurs, rubs, or gallops.  Respiratory: Clear to auscultation bilaterally. GI: Soft, nontender, non-distended  MS: No edema Skin: right radial cath site stable  Assessment & Plan .     89 y.o. male with COPD, malignant neoplasm of the supraglottis status, dysphagia post partial mass removal as well as radiation, hypertension, hyperlipidemia intolerant to (rosuvastatin , simvastatin), prostate cancer who was seen 06/09/2023 for the evaluation of elevated troponins.    NSTEMI -- hsTn [id number]. Initial EKG on admission with subtle ST depression with elevated rates, resolved with improved HR -- Echo showed LVEF of 40-45%, global hypokinesis, normal RV -- underwent cardiac cath 12/30 with pLcx 60%, mRCA 40%, pRCA 30%, recommendations for medical management. -- continue ASA, coreg  3.125mg  BID, statin    Acute HFrEF NICM, suspect tachy-mediated  -- echo showed LVEF 40-45%, global hypokinesis,  normal RV.  -- GDMT: coreg  3.125mg  BID, add losartan  12.5mg  daily  Tachyarrhythmia pAF? -- initial EKG with possible atrial fibrillation, initial plan was to place on Eliquis  but has episodes of hemoptysis -- tolerating low dose coreg , baseline bradycardia in the 50s therefore unable to titrate up further -- will hold eliquis  for now, plan for cardiac monitor at discharge to further assess   Malignant neoplasm of supraglottis stage III Hoarseness Hemoptysis  -- Reportedly SCC -- Follows oncology, reportedly has no evidence of recurrence.  He does have some dysphagia but does exercises at home. -- has had a few episodes of hemoptysis this admission while on IV heparin . None this morning.   OSA? COPD -- Previous pulmonary hypertension noted on other imaging modalities and has nocturnal hypoxemia noted on chart.   -- Likely needs outpatient sleep study.  Continue inhalers.   Hyperlipidemia -- LDL 107 -- on crestor  10mg  daily    Left wrist pain Gout -- placed on colchicine  briefly, now resolved   For questions or updates, please contact [facility] Please consult [email or web address] for contact info under        Signed, Manuelita Rummer, NP

## 2023-06-20 ENCOUNTER — Ambulatory Visit: Payer: Medicare Other | Admitting: Cardiology

## 2023-06-21 ENCOUNTER — Telehealth (HOSPITAL_COMMUNITY): Payer: Self-pay

## 2023-06-21 NOTE — Progress Notes (Signed)
 Cardiology Office Note    Date:  06/23/2023  ID:  Kamdin, Follett 1933/03/09, MRN [id number] PCP:  Janey Santos, MD  Cardiologist:  [name], MD  Electrophysiologist:  None   Chief Complaint: Hospital follow   History of Present Illness: .    Julian Brown is a 89 y.o. 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  On 06/08/2023 he presented to the emergency room for elevated heart rates in the 130s.  His EKG in the ED was overall difficult to discern type of SVT as he had wandering baseline.  He was given 5 mg of IV Lopressor  which reportedly showed atrial flutter and reportedly fibrillation but spontaneously converted back to normal sinus rhythm with frequent PACs and PVCs.  Troponins were elevated at 1868  then 2131.  His CTA was negative for PE.  Patient was overall asymptomatic.  He denies chest pain or shortness of breath.  He underwent cardiac catheterization on 12/30 with PL CX 60%, M RCA 40%, P RCA 30% with recommendations for medical management.  Echo showed LVEF 40 to 45%, global hypokinesis and normal RV, felt to be tachycardia mediated.  Initially the plan was to start Eliquis  but patient had episodes of hemoptysis following heart catheterization.  Recommended to retrial Eliquis  as an outpatient as long as he has no further bleeding.  He is started on losartan  12.5 mg daily and carvedilol  3.125 mg twice daily.  Today he presents for follow-up with his wife and daughter.  He reports that he is doing well overall.  He denies any further chest pain or shortness of breath.  He denies any feelings of palpitations or increased heart rate.  Patient has brought cardiac monitor to appointment with him requesting placement.  He notes the day after he was discharged he had a small bit of blood in his sputum however denies any other  bleeding and no recurrence. ROS: .   Today he denies chest pain, shortness of breath, lower extremity edema, fatigue, palpitations, melena, hematuria, diaphoresis, weakness, presyncope, syncope, orthopnea, and PND.  All other systems are reviewed and otherwise negative. Studies Reviewed: SABRA    EKG:  EKG is ordered today, personally reviewed, demonstrating  EKG Interpretation Date/Time:  Friday June 23 2023 10:43:53 EST Ventricular Rate:  61 PR Interval:  166 QRS Duration:  82 QT Interval:  390 QTC Calculation: 392 R Axis:   47  Text Interpretation: Sinus rhythm with Premature atrial complexes When compared with ECG of 09-Jun-2023 16:32, Sinus rhythm has replaced Atrial fibrillation Vent. rate has decreased BY  69 BPM ST no longer depressed in Inferior leads Nonspecific T wave abnormality no longer evident in Inferior leads Nonspecific T wave abnormality no longer evident in Anterolateral leads Confirmed by Alyus Mofield [phone]) on 06/23/2023 10:50:41 AM    CV Studies:  Cardiac Studies & Procedures   CARDIAC CATHETERIZATION  CARDIAC CATHETERIZATION 06/12/2023  Narrative   Ost RCA to Prox RCA lesion is 30% stenosed.   Mid RCA lesion is 40% stenosed.   Prox Cx lesion is 60% stenosed.  Patent left main with no stenosis Patent LAD with no stenosis Moderate stenosis of the proximal circumflex, estimated at 60-70%, appropriate for medical therapy Patent RCA (dominant vessel) with mild nonobstructive plaquing (30% proximal and 40% mid-vessel stenosis) Normal LVEDP  Recommendations: start apixaban  tomorrow morning for new atrial fibrillation. Med Rx for CAD.  Findings Coronary Findings Diagnostic  Dominance: Right  Left Main The vessel exhibits minimal luminal irregularities.  Left Anterior Descending The vessel exhibits minimal luminal irregularities.  Left Circumflex Prox Cx lesion is 60% stenosed.  Right Coronary Artery Ost RCA to Prox RCA lesion is 30% stenosed. Mid RCA  lesion is 40% stenosed.  Intervention  No interventions have been documented.    ECHOCARDIOGRAM  ECHOCARDIOGRAM COMPLETE 06/09/2023  Narrative ECHOCARDIOGRAM REPORT    Patient Name:   Julian Brown [facility] Date of Exam: 06/09/2023 Medical Rec #:  [id number]       Height:       69.0 in Accession #:    [id number]      Weight:       156.5 lb Date of Birth:  12-29-1932      BSA:          1.861 m Patient Age:    89 years        BP:           144/70 mmHg Patient Gender: M               HR:           82 bpm. Exam Location:  Inpatient  Procedure: 2D Echo  Indications:    nstemi  History:        Patient has no prior history of Echocardiogram examinations.  Sonographer:    Thea Norlander Referring Phys: [id number] ANDEE FLATTEN  IMPRESSIONS   1. Left ventricular ejection fraction, by estimation, is 40 to 45%. The left ventricle has mildly decreased function. The left ventricle demonstrates global hypokinesis. The left ventricular internal cavity size was mildly dilated. Left ventricular diastolic parameters are indeterminate. 2. Right ventricular systolic function is normal. The right ventricular size is normal. 3. Left atrial size was mildly dilated. 4. The mitral valve is normal in structure. Mild mitral valve regurgitation. No evidence of mitral stenosis. 5. The aortic valve is tricuspid. Aortic valve regurgitation is not visualized. Aortic valve sclerosis/calcification is present, without any evidence of aortic stenosis. 6. The inferior vena cava is dilated in size with >50% respiratory variability, suggesting right atrial pressure of 8 mmHg.  Comparison(s): No prior Echocardiogram.  FINDINGS Left Ventricle: Left ventricular ejection fraction, by estimation, is 40 to 45%. The left ventricle has mildly decreased function. The left ventricle demonstrates global hypokinesis. The left ventricular internal cavity size was mildly dilated. There is no left ventricular hypertrophy. Left  ventricular diastolic parameters are indeterminate.  Right Ventricle: The right ventricular size is normal. Right ventricular systolic function is normal.  Left Atrium: Left atrial size was mildly dilated.  Right Atrium: Right atrial size was normal in size.  Pericardium: There is no evidence of pericardial effusion.  Mitral Valve: The mitral valve is normal in structure. Mild mitral valve regurgitation. No evidence of mitral valve stenosis.  Tricuspid Valve: The tricuspid valve is normal in structure. Tricuspid valve regurgitation is trivial. No evidence of tricuspid stenosis.  Aortic Valve: The aortic valve is tricuspid. Aortic valve regurgitation is not visualized. Aortic valve sclerosis/calcification is present, without any evidence of aortic stenosis. Aortic valve peak gradient measures 7.8 mmHg.  Pulmonic Valve: The pulmonic valve was normal in structure. Pulmonic valve regurgitation is mild. No evidence of pulmonic stenosis.  Aorta: The aortic root is normal in size and structure.  Venous: The inferior vena cava is dilated in size with greater than 50% respiratory variability, suggesting right atrial pressure of 8 mmHg.  IAS/Shunts: No  atrial level shunt detected by color flow Doppler.   LEFT VENTRICLE PLAX 2D LVIDd:         5.40 cm     Diastology LVIDs:         4.40 cm     LV e' medial:    9.03 cm/s LV PW:         0.90 cm     LV E/e' medial:  7.0 LV IVS:        0.60 cm     LV e' lateral:   12.00 cm/s LVOT diam:     2.10 cm     LV E/e' lateral: 5.3 LV SV:         65 LV SV Index:   35 LVOT Area:     3.46 cm  LV Volumes (MOD) LV vol d, MOD A2C: 84.2 ml LV vol d, MOD A4C: 62.0 ml LV vol s, MOD A2C: 41.9 ml LV vol s, MOD A4C: 29.7 ml LV SV MOD A2C:     42.3 ml LV SV MOD A4C:     62.0 ml LV SV MOD BP:      39.1 ml  RIGHT VENTRICLE             IVC RV S prime:     16.80 cm/s  IVC diam: 2.20 cm TAPSE (M-mode): 1.9 cm  LEFT ATRIUM             Index        RIGHT ATRIUM            Index LA diam:        2.80 cm 1.50 cm/m   RA Area:     15.40 cm LA Vol (A2C):   74.3 ml 39.91 ml/m  RA Volume:   36.00 ml  19.34 ml/m LA Vol (A4C):   44.9 ml 24.12 ml/m LA Biplane Vol: 61.9 ml 33.25 ml/m AORTIC VALVE AV Area (Vmax): 2.47 cm AV Vmax:        140.00 cm/s AV Peak Grad:   7.8 mmHg LVOT Vmax:      100.00 cm/s LVOT Vmean:     63.200 cm/s LVOT VTI:       0.188 m  AORTA Ao Root diam: 3.40 cm Ao Asc diam:  3.20 cm  MITRAL VALVE MV Area (PHT): 2.62 cm    SHUNTS MV Decel Time: 289 msec    Systemic VTI:  0.19 m MR Peak grad: 28.4 mmHg    Systemic Diam: 2.10 cm MR Vmax:      266.50 cm/s MV E velocity: 63.20 cm/s MV A velocity: 46.40 cm/s MV E/A ratio:  1.36  Redell Shallow MD Electronically signed by Redell Shallow MD Signature Date/Time: 06/09/2023/3:55:35 PM    Final              Current Reported Medications:.    Current Meds  Medication Sig   apixaban  (ELIQUIS ) 5 MG TABS tablet Take 1 tablet (5 mg total) by mouth 2 (two) times daily.   Probiotic Product (PROBIOTIC PO) Take 1 capsule by mouth daily.   VITAMIN D  PO Take 1 capsule by mouth daily.   [DISCONTINUED] aspirin  81 MG chewable tablet Chew 1 tablet (81 mg total) by mouth daily.   [DISCONTINUED] carvedilol  (COREG ) 3.125 MG tablet Take 1 tablet (3.125 mg total) by mouth 2 (two) times daily with a meal.   [DISCONTINUED] losartan  (COZAAR ) 25 MG tablet Take 0.5 tablets (12.5 mg total) by mouth daily.   [DISCONTINUED]  rosuvastatin  (CRESTOR ) 10 MG tablet Take 1 tablet (10 mg total) by mouth daily.   Physical Exam:    VS:  BP 112/62   Pulse 63   Ht 5' 11 (1.803 m)   Wt 157 lb 3.2 oz (71.3 kg)   SpO2 98%   BMI 21.92 kg/m    Wt Readings from Last 3 Encounters:  06/23/23 157 lb 3.2 oz (71.3 kg)  06/12/23 150 lb 9.2 oz (68.3 kg)  12/08/22 159 lb 8 oz (72.3 kg)    GEN: Well nourished, well developed in no acute distress NECK: No JVD; No carotid bruits CARDIAC: RRR, no murmurs, rubs,  gallops, right radial cath site clean and intact without evidence of hematoma  RESPIRATORY:  Clear to auscultation without rales, wheezing or rhonchi  ABDOMEN: Soft, non-tender, non-distended EXTREMITIES:  No edema; No acute deformity   Asessement and Plan:.    Tachyarrhythmia/pAF/ atrial flutter: Initial EKG on presentation to hospital in 06/08/2023 showed possible atrial fibrillation versus flutter. Initially recommended while in hospital to start on Eliquis  however patient had hemoptysis following heart catheterization.  Today he notes a small bit of blood in his sputum the day after discharge but has had no further recurrence and denies any other bleeding problems.  He denies any palpitations or feeling of increased heart rate.  Patient's CHA2DS2-VASc score is at least 3.  Per Dr. Gilda recommendation we will start Eliquis  5 mg twice daily and discontinue aspirin .  Discussed with patient the importance of monitoring for any signs of bleeding, he will notify the office in this case. Check CBC and BMET today, will repeat CBC in three weeks.  Patient has cardiac monitor with him, placed in office. Continue carvedilol  3.125 mg twice daily.   CAD: Cardiac cath on 06/12/2023 with PL CX 60%, mRCA 40%, P RCA 30%, recommended for medical management.  Elevations in troponin felt due to tachycardia.  Today patient denies any chest pain or shortness of breath.  Reviewed ED precautions. Continue carvedilol  3.125 mg twice daily and statin.  HFrEF/NICM: Echo during admission indicated LVEF 40 to 45%, global hypokinesis and normal RV.  Felt to be tachycardia mediated.  Started on carvedilol  3.125 mg twice daily and losartan  12.5 mg daily. Today he denies any shortness of breath, orthopnea, PND or lower extremity edema.  He will continue to monitor his blood pressure at home, he will notify the office of increased lower extremity edema, shortness of breath or weight gain of 2 to 3 pounds overnight or 5 pounds  in a week.  Continue carvedilol  3.125 mg twice daily and losartan  12.5 mg daily, refills provided.  Malignant neoplasm of supraglottis: History of partial mass removal and radiation, follows with oncology.  Reportedly has had no evidence of recurrence.  During admission noted a few episodes of hemoptysis while on IV heparin .  Patient noted 1 episode of hemoptysis day after discharge, he denies any further reoccurrence.  Started on Eliquis  as noted above, patient will monitor for any bleeding and notify the office.  COPD/sleep disordered breathing: Previously noted to have pulmonary hypertension on prior imaging and noted to have nocturnal hypoxemia in chart.  Patient reports that he is currently followed by a pulmonologist and is on oxygen at night, he plans to follow-up with his pulmonologist to discuss possible sleep study.  Hyperlipidemia: Last lipid profile on 06/09/2023 indicated total cholesterol 161, HDL 43, triglycerides 54 and LDL 107.  Started on rosuvastatin  10 mg daily.  Check fasting lipid profile and LFTs  in 2 months.    Disposition: F/u with Sharia Averitt, NP in 6-8 weeks or sooner if needed.   Signed, Abbigaile Rockman D Chapman Matteucci, NP

## 2023-06-21 NOTE — Telephone Encounter (Signed)
 Called and spoke with pt daughter Arline Asp in regards to CR, she states is unable to participate at this time due to lack of transportation .   Closed referral

## 2023-06-23 ENCOUNTER — Encounter: Payer: Self-pay | Admitting: Cardiology

## 2023-06-23 ENCOUNTER — Ambulatory Visit: Payer: Medicare Other | Attending: Cardiology | Admitting: Cardiology

## 2023-06-23 VITALS — BP 112/62 | HR 63 | Ht 71.0 in | Wt 157.2 lb

## 2023-06-23 DIAGNOSIS — J449 Chronic obstructive pulmonary disease, unspecified: Secondary | ICD-10-CM | POA: Diagnosis present

## 2023-06-23 DIAGNOSIS — C321 Malignant neoplasm of supraglottis: Secondary | ICD-10-CM | POA: Insufficient documentation

## 2023-06-23 DIAGNOSIS — I4892 Unspecified atrial flutter: Secondary | ICD-10-CM | POA: Diagnosis present

## 2023-06-23 DIAGNOSIS — I2510 Atherosclerotic heart disease of native coronary artery without angina pectoris: Secondary | ICD-10-CM | POA: Diagnosis present

## 2023-06-23 DIAGNOSIS — G4730 Sleep apnea, unspecified: Secondary | ICD-10-CM | POA: Insufficient documentation

## 2023-06-23 DIAGNOSIS — E782 Mixed hyperlipidemia: Secondary | ICD-10-CM | POA: Diagnosis present

## 2023-06-23 DIAGNOSIS — I5020 Unspecified systolic (congestive) heart failure: Secondary | ICD-10-CM | POA: Insufficient documentation

## 2023-06-23 MED ORDER — CARVEDILOL 3.125 MG PO TABS: 3.1250 mg | ORAL_TABLET | Freq: Two times a day (BID) | ORAL | 3 refills | Status: DC

## 2023-06-23 MED ORDER — APIXABAN 5 MG PO TABS: 5.0000 mg | ORAL_TABLET | Freq: Two times a day (BID) | ORAL | 3 refills | Status: DC

## 2023-06-23 MED ORDER — ROSUVASTATIN CALCIUM 10 MG PO TABS: 10.0000 mg | ORAL_TABLET | Freq: Every day | ORAL | 3 refills | Status: DC

## 2023-06-23 MED ORDER — LOSARTAN POTASSIUM 25 MG PO TABS: 12.5000 mg | ORAL_TABLET | Freq: Every day | ORAL | 3 refills | Status: DC

## 2023-06-23 NOTE — Patient Instructions (Addendum)
 Medication Instructions:  Start Eliquis  5 mg twice a day Stop Aspirin  *If you need a refill on your cardiac medications before your next appointment, please call your pharmacy*  Lab Work: Today we will draw CBC and Bmet In 3 weeks we will need a CBC drawn In 2 month we will need fasting lipid panel and LFT If you have labs (blood work) drawn today and your tests are completely normal, you will receive your results only by: MyChart Message (if you have MyChart) OR A paper copy in the mail If you have any lab test that is abnormal or we need to change your treatment, we will call you to review the results.  Testing/Procedures: No testing  Follow-Up: At [facility], you and your health needs are our priority.  As part of our continuing mission to provide you with exceptional heart care, we have created designated Provider Care Teams.  These Care Teams include your primary Cardiologist (physician) and Advanced Practice Providers (APPs -  Physician Assistants and Nurse Practitioners) who all work together to provide you with the care you need, when you need it.  We recommend signing up for the patient portal called MyChart.  Sign up information is provided on this After Visit Summary.  MyChart is used to connect with patients for Virtual Visits (Telemedicine).  Patients are able to view lab/test results, encounter notes, upcoming appointments, etc.  Non-urgent messages can be sent to your provider as well.   To learn more about what you can do with MyChart, go to [email or web address].    Your next appointment:   6-8 week(s) AM appointment   Provider:   Katlyn West, NP  Other Instructions: Monitor increase SOB and Lower Extremity Edema weight gain 3 lb over night of 5 lb over a week to notify the office. Monitor for increase bleeding notify the office.

## 2023-06-24 LAB — BASIC METABOLIC PANEL
BUN/Creatinine Ratio: 21 (ref 10–24)
BUN: 19 mg/dL (ref 8–27)
CO2: 28 mmol/L (ref 20–29)
Calcium: 9.9 mg/dL (ref 8.6–10.2)
Chloride: 101 mmol/L (ref 96–106)
Creatinine, Ser: 0.9 mg/dL (ref 0.76–1.27)
Glucose: 94 mg/dL (ref 70–99)
Potassium: 4.8 mmol/L (ref 3.5–5.2)
Sodium: 142 mmol/L (ref 134–144)
eGFR: 82 mL/min/{1.73_m2} (ref >59)

## 2023-06-24 LAB — CBC
Hematocrit: 41.1 % (ref 37.5–51.0)
Hemoglobin: 13.5 g/dL (ref 13.0–17.7)
MCH: 33.4 pg — ABNORMAL HIGH (ref 26.6–33.0)
MCHC: 32.8 g/dL (ref 31.5–35.7)
MCV: 102 fL — ABNORMAL HIGH (ref 79–97)
Platelets: 261 10*3/uL (ref 150–450)
RBC: 4.04 x10E6/uL — ABNORMAL LOW (ref 4.14–5.80)
RDW: 12.4 % (ref 11.6–15.4)
WBC: 7.6 10*3/uL (ref 3.4–10.8)

## 2023-06-26 ENCOUNTER — Telehealth: Payer: Self-pay

## 2023-06-26 NOTE — Telephone Encounter (Signed)
-----   Message from [facility] sent at 06/25/2023  6:08 PM EST ----- Please let Julian Brown know that his hemoglobin and hematocrit are improving. His kidney function is normal and his electrolytes are normal. Continue with plan discussed at appointment.

## 2023-06-26 NOTE — Telephone Encounter (Signed)
 Called patient daughter advised of below they verbalized understanding

## 2023-07-11 ENCOUNTER — Telehealth: Payer: Self-pay | Admitting: Cardiovascular Disease

## 2023-07-11 NOTE — Telephone Encounter (Signed)
Daughter called back to report patients blood pressure is 129/67 with heart rate 45 bpm per the blood pressure monitor. The pulse is irregular when palpated but is not going fast.

## 2023-07-11 NOTE — Telephone Encounter (Signed)
Caller Julian Brown) is reporting monitoring results.

## 2023-07-11 NOTE — Telephone Encounter (Signed)
Received phone call from Lambert Mody reporting an urgent EKG for Dr. Landry Dyke patient. Alisha verified patient name and DOB. Report: New onset a flutter with HR in the 140s on 07/10/2023 at 0431.   Josie LPN

## 2023-07-11 NOTE — Telephone Encounter (Signed)
Spoke with Dr. Milus Mallick.. (DOD). Dr viewed urgent EKG report showing A fib HR 141. Dr. Dot Been  recommends referral to A fib clinic (Appt made for 07/20/2023 at 3:30 pm). Once we contact patient, if symptomatic and in afib, recommend ER evaluation.   Josie LPN

## 2023-07-11 NOTE — Telephone Encounter (Signed)
Spoke with pt daughter Arline Asp. Arline Asp verified pt name and DOB. Pt daughter states pt has not shown symptoms of chest pain, shortness of breath, or headache. Pt not with daughter at the moment. I advised to contact our office once she is with pt to report to [place] his current HR and blood pressure for further recommendations. An appointment was made while daughter was on the call with the afib clinic on 07/20/2023 at 03:30 pm. Pt daughter verbalized understanding.

## 2023-07-11 NOTE — Telephone Encounter (Signed)
Attempted to call patient at 09:07 and 09:30 to discuss symptoms of Afib. No response, left message to request a call back.   Josie LPN

## 2023-07-20 ENCOUNTER — Encounter (HOSPITAL_COMMUNITY): Payer: Self-pay | Admitting: Physician Assistant

## 2023-07-20 ENCOUNTER — Ambulatory Visit (HOSPITAL_COMMUNITY)
Admission: RE | Admit: 2023-07-20 | Discharge: 2023-07-20 | Disposition: A | Discharge status: Home / Self Care | Payer: Medicare Other | Source: Ambulatory Visit | Attending: Physician Assistant | Admitting: Physician Assistant

## 2023-07-20 VITALS — BP 146/98 | HR 74 | Ht 71.0 in | Wt 159.4 lb

## 2023-07-20 DIAGNOSIS — D6869 Other thrombophilia: Secondary | ICD-10-CM | POA: Diagnosis not present

## 2023-07-20 DIAGNOSIS — Z7901 Long term (current) use of anticoagulants: Secondary | ICD-10-CM | POA: Insufficient documentation

## 2023-07-20 DIAGNOSIS — I2510 Atherosclerotic heart disease of native coronary artery without angina pectoris: Secondary | ICD-10-CM | POA: Diagnosis not present

## 2023-07-20 DIAGNOSIS — I484 Atypical atrial flutter: Secondary | ICD-10-CM

## 2023-07-20 DIAGNOSIS — Z79899 Other long term (current) drug therapy: Secondary | ICD-10-CM | POA: Diagnosis not present

## 2023-07-20 DIAGNOSIS — I480 Paroxysmal atrial fibrillation: Secondary | ICD-10-CM | POA: Diagnosis present

## 2023-07-20 DIAGNOSIS — I5020 Unspecified systolic (congestive) heart failure: Secondary | ICD-10-CM | POA: Diagnosis not present

## 2023-07-20 DIAGNOSIS — E785 Hyperlipidemia, unspecified: Secondary | ICD-10-CM | POA: Insufficient documentation

## 2023-07-20 DIAGNOSIS — I110 Hypertensive heart disease with heart failure: Secondary | ICD-10-CM | POA: Insufficient documentation

## 2023-07-20 DIAGNOSIS — I4892 Unspecified atrial flutter: Secondary | ICD-10-CM | POA: Diagnosis not present

## 2023-07-20 DIAGNOSIS — I491 Atrial premature depolarization: Secondary | ICD-10-CM | POA: Insufficient documentation

## 2023-07-20 DIAGNOSIS — Z8521 Personal history of malignant neoplasm of larynx: Secondary | ICD-10-CM | POA: Insufficient documentation

## 2023-07-20 DIAGNOSIS — I493 Ventricular premature depolarization: Secondary | ICD-10-CM | POA: Diagnosis not present

## 2023-07-20 DIAGNOSIS — J439 Emphysema, unspecified: Secondary | ICD-10-CM | POA: Insufficient documentation

## 2023-07-20 MED ORDER — AMIODARONE HCL 200 MG PO TABS: 200.0000 mg | ORAL_TABLET | Freq: Every day | ORAL | 3 refills | Status: DC

## 2023-07-20 NOTE — Patient Instructions (Signed)
 Start Amiodarone  200mg  once daily

## 2023-07-20 NOTE — Progress Notes (Signed)
 Primary Care Physician: Avva, Ravisankar, MD Primary Cardiologist: [name], MD Electrophysiologist: None  Referring Physician: Dr Julian Brown Julian Brown is a 89 y.o. male with a history of CAD, HFrEF, COPD, HLD, atrial fibrillation who presents for follow up in the [facility]. On 06/08/2023 he presented to the emergency room for elevated heart rates in the 130s. His EKG in the ED was overall difficult to discern type of SVT as he had wandering baseline. He was given 5 mg of IV Lopressor  which reportedly showed atrial flutter and then atrial fibrillation but spontaneously converted back to normal sinus rhythm. Troponin was elevated at 1868  then 2131. He underwent R/LHC which showed non obstructive disease, medical management recommended. Echo showed EF 40-45%, global hypokinesis. Initial recommendation was to start on Eliquis  after his LHC but he had an episode of hemoptysis. He was started on Eliquis  after discharge on 06/23/23.   An event monitor was also placed. Results are pending. On 07/11/23, there was an alert for atrial flutter with RVR on the event monitor. Reviewed by Dr Julian who recommended AF clinic visit.   Patient is on Eliquis  for stroke prevention.   Patient presents today for follow up for atrial fibrillation and atrial flutter. He reports that he is unaware of his arrhythmia. No bleeding issues on anticoagulation. He is still wearing the event monitor.   Today, he denies symptoms of palpitations, chest pain, shortness of breath, orthopnea, PND, lower extremity edema, dizziness, presyncope, syncope, snoring, daytime somnolence, bleeding, or neurologic sequela. The patient is tolerating medications without difficulties and is otherwise without complaint today.    Atrial Fibrillation Risk Factors:  he does not have symptoms or diagnosis of sleep apnea. he does not have a history of rheumatic fever.   Atrial Fibrillation Management  history:  Previous antiarrhythmic drugs: none Previous cardioversions: none Previous ablations: none Anticoagulation history: Eliquis   ROS- All systems are reviewed and negative except as per the HPI above.  Past Medical History:  Diagnosis Date   Acute gangrenous cholecystitis s/p lap cholecystectomy 06/08/2017 06/08/2017   Arthritis    Colon cancer [facility])    ED (erectile dysfunction)    Emphysema of lung (HCC)    Hyperlipemia    Hypertension    Prostate cancer (HCC)    prostate cancer-tx radiation   Wears glasses     Current Outpatient Medications  Medication Sig Dispense Refill   amiodarone  (PACERONE ) 200 MG tablet Take 1 tablet (200 mg total) by mouth daily. 90 tablet 3   apixaban  (ELIQUIS ) 5 MG TABS tablet Take 1 tablet (5 mg total) by mouth 2 (two) times daily. 180 tablet 3   carvedilol  (COREG ) 3.125 MG tablet Take 1 tablet (3.125 mg total) by mouth 2 (two) times daily with a meal. 180 tablet 3   losartan  (COZAAR ) 25 MG tablet Take 0.5 tablets (12.5 mg total) by mouth daily. 90 tablet 3   Probiotic Product (PROBIOTIC PO) Take 1 capsule by mouth daily.     rosuvastatin  (CRESTOR ) 10 MG tablet Take 1 tablet (10 mg total) by mouth daily. 90 tablet 3   Tiotropium Bromide  Monohydrate (SPIRIVA  RESPIMAT) 1.25 MCG/ACT AERS Inhale 2 puffs into the lungs daily. 4 g 5   VITAMIN D  PO Take 1 capsule by mouth daily.     No current facility-administered medications for this encounter.    Physical Exam: BP (!) 146/98   Pulse 74   Ht 5' 11 (1.803 m)  Wt 72.3 kg   BMI 22.23 kg/m   GEN: Well nourished, well developed in no acute distress CARDIAC: Regular rate and rhythm with occasional ectopy, no murmurs, rubs, gallops RESPIRATORY:  Clear to auscultation without rales, wheezing or rhonchi  ABDOMEN: Soft, non-tender, non-distended EXTREMITIES:  No edema; No deformity   Wt Readings from Last 3 Encounters:  07/20/23 72.3 kg  06/23/23 71.3 kg  06/12/23 68.3 kg     EKG today  demonstrates  SR, PACs Vent. rate 74 BPM PR interval 164 ms QRS duration 76 ms QT/QTcB 366/406 ms   Echo 06/09/23 demonstrated   1. Left ventricular ejection fraction, by estimation, is 40 to 45%. The  left ventricle has mildly decreased function. The left ventricle  demonstrates global hypokinesis. The left ventricular internal cavity size  was mildly dilated. Left ventricular diastolic parameters are indeterminate.   2. Right ventricular systolic function is normal. The right ventricular  size is normal.   3. Left atrial size was mildly dilated.   4. The mitral valve is normal in structure. Mild mitral valve  regurgitation. No evidence of mitral stenosis.   5. The aortic valve is tricuspid. Aortic valve regurgitation is not  visualized. Aortic valve sclerosis/calcification is present, without any  evidence of aortic stenosis.   6. The inferior vena cava is dilated in size with >50% respiratory  variability, suggesting right atrial pressure of 8 mmHg.   Comparison(s): No prior Echocardiogram.         CHA2DS2-VASc Score = 4  The patient's score is based upon: CHF History: 1 HTN History: 0 Diabetes History: 0 Stroke History: 0 Vascular Disease History: 1 Age Score: 2 Gender Score: 0       ASSESSMENT AND PLAN: Paroxysmal Atrial Fibrillation/atrial flutter The patient's CHA2DS2-VASc score is 4, indicating a 4.8% annual risk of stroke.   General education about afib and atrial flutter provided and questions answered. We discussed his stroke risk and the risks and benefits of anticoagulation. We also discussed rhythm control options. Would avoid class IC and Multaq with reduced EF. Likely not a candidate for ablation due to advanced age. His daughter reports that he cannot swallow pills due to his history of supraglottis cancer. Will not be able to use dofetilide which is a capsule.  After discussing the risks and benefits will start amiodarone  200 mg daily.  Continue  carvedilol  3.125 mg BID Continue Eliquis  5 mg BID Event monitor results pending.   Secondary Hypercoagulable State (ICD10:  D68.69) The patient is at significant risk for stroke/thromboembolism based upon his CHA2DS2-VASc Score of 4.  Continue Apixaban  (Eliquis ).   HFrEF EF 40-45% Suspected tachycardia mediated GDMT per primary cardiology team Fluid status appears stable  CAD LHC 06/12/23 showed 60-70% stenosis in circumflex and mild plaque in RCA. No anginal symptoms   Follow up with Katlyn West as scheduled. AF clinic in 3 months.        Daril Kicks PA-C [facility] [address] [place], [place] [address] [phone]

## 2023-07-24 ENCOUNTER — Ambulatory Visit (INDEPENDENT_AMBULATORY_CARE_PROVIDER_SITE_OTHER): Payer: Medicare Other | Admitting: Orthopaedic Surgery

## 2023-07-24 ENCOUNTER — Encounter: Payer: Self-pay | Admitting: Orthopaedic Surgery

## 2023-07-24 DIAGNOSIS — M1711 Unilateral primary osteoarthritis, right knee: Secondary | ICD-10-CM

## 2023-07-24 DIAGNOSIS — M25561 Pain in right knee: Secondary | ICD-10-CM

## 2023-07-24 DIAGNOSIS — M25562 Pain in left knee: Secondary | ICD-10-CM

## 2023-07-24 DIAGNOSIS — M1712 Unilateral primary osteoarthritis, left knee: Secondary | ICD-10-CM | POA: Diagnosis not present

## 2023-07-24 DIAGNOSIS — G8929 Other chronic pain: Secondary | ICD-10-CM

## 2023-07-24 MED ORDER — METHYLPREDNISOLONE ACETATE 40 MG/ML IJ SUSP
40.0000 mg | INTRAMUSCULAR | Status: AC | PRN
  Administered 2023-07-24: 40 mg via INTRA_ARTICULAR

## 2023-07-24 MED ORDER — LIDOCAINE HCL 1 % IJ SOLN
3.0000 mL | INTRAMUSCULAR | Status: AC | PRN
  Administered 2023-07-24: 3 mL

## 2023-07-24 NOTE — Progress Notes (Signed)
 The patient is well-known to me.  He is 89 years old and has severe arthritis in both of his knees.  He comes in about every 3 months for steroid injection in both knees.  He is not a diabetic.  He has been recently diagnosed with A-fib and is on blood thinners.  His family is with him today as well.  He says the injections do help for a while.  Examination of both knees shows slight varus malalignment.  There is no significant effusion with either knee but there is global tenderness and previous x-rays again confirms severe arthritis in both knees.  Per the request I did place a steroid injection in both knees which she tolerated well.  They know to wait at least 3 months before repeat steroid injections.    Procedure Note  Patient: Julian Brown             Date of Birth: 12-Aug-1932           MRN: [id number]             Visit Date: 07/24/2023  Procedures: Visit Diagnoses:  1. Chronic pain of left knee   2. Chronic pain of right knee   3. Primary osteoarthritis of right knee   4. Unilateral primary osteoarthritis, left knee     Large Joint Inj: R knee on 07/24/2023 4:06 PM Indications: diagnostic evaluation and pain Details: 22 G 1.5 in needle, superolateral approach  Arthrogram: No  Medications: 3 mL lidocaine  1 %; 40 mg methylPREDNISolone  acetate 40 MG/ML Outcome: tolerated well, no immediate complications Procedure, treatment alternatives, risks and benefits explained, specific risks discussed. Consent was given by the patient. Immediately prior to procedure a time out was called to verify the correct patient, procedure, equipment, support staff and site/side marked as required. Patient was prepped and draped in the usual sterile fashion.    Large Joint Inj: L knee on 07/24/2023 4:06 PM Indications: diagnostic evaluation and pain Details: 22 G 1.5 in needle, superolateral approach  Arthrogram: No  Medications: 3 mL lidocaine  1 %; 40 mg methylPREDNISolone  acetate 40  MG/ML Outcome: tolerated well, no immediate complications Procedure, treatment alternatives, risks and benefits explained, specific risks discussed. Consent was given by the patient. Immediately prior to procedure a time out was called to verify the correct patient, procedure, equipment, support staff and site/side marked as required. Patient was prepped and draped in the usual sterile fashion.

## 2023-07-24 NOTE — Progress Notes (Deleted)
   Cardiology Office Note    Date:  07/24/2023  ID:  Julian Brown, Julian Brown 08-Jan-1933, MRN [id number] PCP:  [name], MD  Cardiologist:  Nicki Guadalajara, MD  Electrophysiologist:  None   Chief Complaint: ***  History of Present Illness: .    Julian Brown is a 89 y.o. male with visit-pertinent history of ***  Labwork independently reviewed:   ROS: .    Please see the history of present illness. Otherwise, review of systems is positive for ***.  All other systems are reviewed and otherwise negative.  Studies Reviewed: Marland Kitchen    EKG:  EKG is ordered today, personally reviewed, demonstrating ***  CV Studies: Cardiac studies reviewed are outlined and summarized above. Otherwise please see EMR for full report.   Current Reported Medications:.    No outpatient medications have been marked as taking for the 08/02/23 encounter (Appointment) with Rip Harbour, NP.    Physical Exam:    VS:  There were no vitals taken for this visit.   Wt Readings from Last 3 Encounters:  07/20/23 159 lb 6.4 oz (72.3 kg)  06/23/23 157 lb 3.2 oz (71.3 kg)  06/12/23 150 lb 9.2 oz (68.3 kg)    GEN: Well nourished, well developed in no acute distress NECK: No JVD; No carotid bruits CARDIAC: ***RRR, no murmurs, rubs, gallops RESPIRATORY:  Clear to auscultation without rales, wheezing or rhonchi  ABDOMEN: Soft, non-tender, non-distended EXTREMITIES:  No edema; No acute deformity   Asessement and Plan:.     ***     Disposition: F/u with ***  Signed, Rip Harbour, NP

## 2023-07-27 ENCOUNTER — Ambulatory Visit: Payer: Medicare Other | Attending: Cardiovascular Disease

## 2023-07-27 DIAGNOSIS — I4892 Unspecified atrial flutter: Secondary | ICD-10-CM

## 2023-08-01 ENCOUNTER — Encounter: Payer: Self-pay | Admitting: Internal Medicine

## 2023-08-01 ENCOUNTER — Ambulatory Visit (INDEPENDENT_AMBULATORY_CARE_PROVIDER_SITE_OTHER): Payer: Medicare Other | Admitting: Internal Medicine

## 2023-08-01 VITALS — BP 143/68 | HR 50 | Ht 70.5 in | Wt 155.4 lb

## 2023-08-01 DIAGNOSIS — J449 Chronic obstructive pulmonary disease, unspecified: Secondary | ICD-10-CM | POA: Diagnosis not present

## 2023-08-01 DIAGNOSIS — R490 Dysphonia: Secondary | ICD-10-CM | POA: Diagnosis not present

## 2023-08-01 DIAGNOSIS — G4734 Idiopathic sleep related nonobstructive alveolar hypoventilation: Secondary | ICD-10-CM | POA: Diagnosis not present

## 2023-08-01 NOTE — Patient Instructions (Addendum)
Stage 2 moderate COPD by GOLD classification (HCC) Emphysema   - stable   You are not on your inhalers spiriva because of lack of percevived benefit  Plan -  continue albuterol as needed    Multiple lung nodules on CT - stable jan 2019 to Nov 2019 with new nodule -> stable Nov 2020 Hx of supraglottis cancer  -No lung nodules and CT scan of the chest December 2022 but CT Dec 2024 showing some atlectasis  Plan -CT chest without contrast in Jan 2026   Hoarse voice - throat cancer s/p XRT Spring 2021  - ongoing no change  Plan  - per ENT  followup 12 months or sooner; after CT chest

## 2023-08-01 NOTE — Progress Notes (Signed)
OV 03/10/2020  Subjective:  Patient ID: Julian Brown, male , DOB: 1933/01/22 , age 89 y.o. , MRN: [id number] , ADDRESS: [address] [place] [facility] [address]   03/10/2020 -   Chief Complaint  Patient presents with   Follow-up    no problems     HPI Julian Brown y.o. - OV 04/23/2019  Subjective:  Patient ID: Julian Brown, male , DOB: 09/29/1932 , age 62 y.o. , MRN: [id number] , ADDRESS: [address] [place] [address]   04/23/2019 -   Chief Complaint  Patient presents with   Follow-up    Pt states he has been doing well since last visit. Pt's wife says that pt has been coughing a lot with white to yellow phlegm and has SOB with activities.   COPD and nodule follow-up  HPI Julian Brown 89 y.o. -presents with his wife.  He is not giving much of a history.  Wife is giving most of the history.  She is very concerned.  She says that in the summer 2020 he decompensated with increased cough and congestion.  Saw primary care physician x-ray was normal.  Does not recollect if he got antibiotics and steroids.  Now for the last few weeks he has had increased cough, congestion, increased urine volume but no change in sputum color or wheezing or shortness of breath.  COPD CAT score is 13 but this is filled by him.  She is reporting weight more symptoms.  He is compliant with the Spiriva and Breo.  In addition she is asking about the impact of seasonal allergies on this and is requesting allergy testing.  He is using nighttime oxygen.  She is asking about having a portable indigent system.  Walking desaturation test was abnormal a few years ago.  But she says the canisters are all heavy.  He is not using portable oxygen.  Do not know why.  They are interested in this  Lung nodule.  This is stable: Results are below.  New issue: Reporting hoarseness of voice since gallbladder surgery in December 2018.  Says saw ENT over a year ago and was told that the vocal cords  were red but no nodules or any other abnormalities.  Does not remember any strictures.    OV 03/10/2020   Subjective:  Patient ID: Julian Brown, male , DOB: Mar 30, 1933, age 56 y.o. years. , MRN: [id number],  ADDRESS: [address] [place] [facility] [address] PCP  [name], MD Providers : Treatment Team:  Attending Provider: Kalman Shan, MD   Chief Complaint  Patient presents with   Follow-up    no problems    Follow-up multiple lung nodules last CT scan November 2020 Follow-up emphysema/moderate COPD with nocturnal hypoxemia Follow-up hoarseness of the voice diagnosis throat cancer supraglottis status post XRT spring 2021   HPI Julian Brown 89 y.o. -presents with his wife.  Wife gives most of the history.  I personally last saw the patient in end of 2020.  After that I referred him to ENT.  Appears he has been diagnosed with supraglottis cancer.  He has seen Elisha Headland nurse practitioner 3 times after that.  As of spring 2020 when he is finished radiation.  His voice continues to be hoarse and does not return back to baseline.  Apparently voice rehabilitation is an option for them.  During all this he is now top taking his Spiriva and Breo.  His wife does report  dyspnea on exertion that may be slightly worse than before.  However is not any inhalers.  Also because of all this he stopped doing exercise program.  He does have arthritis and is somewhat of an antalgic gait but is able to do bike riding on a stationary bike if he can have access to 1.  In addition he is able to do all his ADLs.  He has had his Covid vaccine but currently meets indication for booster.  They have been counseled about it.   Results for Julian Brown, Julian Brown (MRN [id number]) as of 11/20/2017 11:02  Ref. Range 11/20/2017 09:12  FEV1-Post Latest Units: L 1.43  FEV1-%Pred-Post Latest Units: % 54  FEV1-%Change-Post Latest Units: % 5  Post FEV1/FVC ratio Latest Units: % 61  FEV1FVC-%Change-Post  Latest Units: % 0  Results for Julian Brown, Julian Brown (MRN [id number]) as of 11/20/2017 11:02  Ref. Range 11/20/2017 09:12  DLCO unc Latest Units: ml/min/mmHg 12.99  DLCO unc % pred Latest Units: % 41    Simple office walk 185 feet x  3 laps goal with forehead probe 10/25/2017  11/20/2017  04/23/2019  O2 used Room air Room air Room air  Number laps completed 2 and desaturated 2 and desaturated 1 and deaurates  Comments about pace Normal with limp Normal pace normla pace  Resting Pulse Ox/HR 96% and 63/min 95% and 61/min 97% and 75/min  Final Pulse Ox/HR 84% and 81/min 87% and 85 at 2nd lap 88% and and 89/min  Desaturated </= 88% yes yes   Desaturated <= 3% points yes yes   Got Tachycardic >/= 90/min no no   Symptoms at end of test No complaints No complaints dyspnea  Miscellaneous comments Corrected with 2L O2 correcte with 2nd lap mild      IMPRESSION: CT chest nov 2020 1. Stable tiny left upper lobe pulmonary nodules. Interval 1 year stability consistent with benign etiology. No new or progressive findings on today's study. 2. Stable bilateral thyroid nodules. 3. Aortic Atherosclerosis (ICD10-I70.0) and Emphysema (ICD10-J43.9).     Electronically Signed   By: Kennith Center M.D.   On: 04/16/2019 13:06     No flowsheet data found.     ROS - per HPI   OV 06/18/2021  Subjective:  Patient ID: Julian Brown, male , DOB: [birth year] , age 25 y.o. , MRN: [id number] , ADDRESS: [address] [place] [address] PCP [name], MD Patient Care Team: [name], MD as PCP - General (Internal Medicine) Corey Skains, MD as Referring Physician (Otolaryngology) Lonie Peak, MD as Attending Physician (Radiation Oncology) Barrie Folk, RN (Inactive) as Oncology Nurse Navigator Anabel Bene, RD as Dietitian (Nutrition) Sundra Aland Karie Georges, CCC-SLP as Speech Language Pathologist (Speech Pathology) Jeanella Craze, Remi Deter, PT as Physical Therapist (Physical  Therapy) Estill Batten, LCSW as Social Worker Malmfelt, Lise Auer, RN as Oncology Nurse Navigator (Oncology)  This Provider for this visit: Treatment Team:  Attending Provider: Kalman Shan, MD    06/18/2021 -   Chief Complaint  Patient presents with   Follow-up    CT recently performed. Pt states he has been doing okay since last visit and denies any complaints.   Follow-up multiple lung nodules last CT scan November 2020 Follow-up emphysema/moderate COPD with nocturnal hypoxemia Follow-up hoarseness of the voice diagnosis throat cancer supraglottis status post XRT spring 2021  HPI Julian Brown 89 y.o. -presents with his wife.  Have not seen him in 2 years.  He is  doing well.  For his COPD is not taking any inhalers.  I did express to him that he would benefit from some inhalers.  He does have some mild shortness of breath but more moderate when he climbs stairs no cough or wheezing.  He is agreed to take Spiriva.  He did have a CT scan of the chest that are normal lung nodules there is no cancer there is no ILD.  He and his wife stated that they did have COVID in the spring 2021 he has not had his by Valent mRNA booster.  I did advise that it would be beneficial.  They are willing to do that.  He has had his flu shot.   No flowsheet data found.     CT Chest data - Dec 2022  IMPRESSION: 1. No suspicious pulmonary nodules. 2. Aortic atherosclerosis (ICD10-I70.0). Coronary artery calcification. 3. Enlarged pulmonic trunk, indicative of pulmonary arterial hypertension. 4.  Emphysema (ICD10-J43.9).     Electronically Signed   By: Leanna Battles M.D.   On: 06/02/2021 11:06  No results found.     OV 05/31/2022  Subjective:  Patient ID: Julian Brown, male , DOB: 1933-04-16 , age 73 y.o. , MRN: [id number] , ADDRESS: [address] [place] [address] PCP [name], MD Patient Care Team: [name], MD as PCP - General (Internal  Medicine) Corey Skains, MD as Referring Physician (Otolaryngology) Lonie Peak, MD as Attending Physician (Radiation Oncology) Anabel Bene, RD as Dietitian (Nutrition) Sundra Aland Karie Georges, CCC-SLP as Speech Language Pathologist (Speech Pathology) Jeanella Craze, Remi Deter, PT as Physical Therapist (Physical Therapy) Malmfelt, Lise Auer, RN as Oncology Nurse Navigator (Oncology) Loa Socks, NP as Nurse Practitioner (Hematology and Oncology)  This Provider for this visit: Treatment Team:  Attending Provider: Kalman Shan, MD    05/31/2022 -   Chief Complaint  Patient presents with   Follow-up    Pt states he has been doing okay since last visit and denies any complaints.     HPI Julian Brown 89 y.o. -returns for follow-up.  Presents with his wife.  Meeting daughter Arline Asp for the first time.  Overall stable.  Last visit to he was not doing his maintenance inhalers.  This time to he told me that he is not doing his maintenance inhalers.  Overall he feels stable.  Wife states that he is stable.  He does have some congestion because of his throat surgery.  We discussed at least taking Spiriva and they are willing to give it a try.  After the last visit in October 2023 the daughter called saying that the DME company wanted to change his oxygen system because it been on it for 5 years.  However they have not heard back from [employer] as yet.  The daughter showed me the letter.  The letter is actually for recertification of ON all.  I explained to them this and they are willing to have an [facility] on room air test done to recertify need for oxygen at night.  Overall otherwise stable.     OV 08/01/2023  Subjective:  Patient ID: Julian Brown, male , DOB: 1932-11-22 , age 55 y.o. , MRN: [id number] , ADDRESS: [address] [place] [address] PCP [name], MD Patient Care Team: [name], MD as PCP - General (Internal Medicine) Lennette Bihari, MD as PCP - Cardiology (Cardiology) Corey Skains, MD as Referring Physician (Otolaryngology) Lonie Peak, MD as Attending  Physician (Radiation Oncology) Anabel Bene, RD as Dietitian (Nutrition) Sundra Aland Karie Georges, CCC-SLP as Speech Language Pathologist (Speech Pathology) Jeanella Craze, Remi Deter, PT as Physical Therapist (Physical Therapy) Malmfelt, Lise Auer, RN as Oncology Nurse Navigator (Oncology) Loa Socks, NP as Nurse Practitioner (Hematology and Oncology)  This Provider for this visit: Treatment Team:  Attending Provider: Kalman Shan, MD    08/01/2023 -   Chief Complaint  Patient presents with   Follow-up   Gold stage III COPD Hoarseness of voice status post radiation to throat cancer 2021  HPI Julian Brown 89 y.o. -returns for follow-up.  I personally not seen him since December 2023.  Presents with his wife and his daughter Misty Stanley who I am meeting for the first time.  It appears in December 2024 [per daughter independent historian and also record review] admitted for type II non-STEMI and atrial fibrillation.  Reviewed cardiac cath which showed nonobstructive coronary artery disease.  He is being treated for A-fib.  He had low ejection fraction 45% that is chronic systolic dysfunction.  Apparently still in A-fib but they have cardiology visit pending.  But apparently he is also rate controlled.  In terms of his baseline hoarseness of voice no problem is got his baseline cough and clearing of the throat because of that but there is no change in shortness of breath or wheezing no respiratory issues per se.  For his COPD is not taking any of his medications other than albuterol as needed.  But overall they report stability.  When he was in the hospital he had a CT scan of the chest there are some new atelectasis in the middle lobe and lingula but given his history of throat cancer and COPD recommended at least getting a CT scan in 1 year time.   The family is willing to do that.    CAT COPD Symptom & Quality of Life Score (GSK trademark) 0 is no burden. 5 is highest burden 04/23/2019   Never Cough -> Cough all the time 3  No phlegm in chest -> Chest is full of phlegm 2  No chest tightness -> Chest feels very tight 1  No dyspnea for 1 flight stairs/hill -> Very dyspneic for 1 flight of stairs 4  No limitations for ADL at home -> Very limited with ADL at home 0  Confident leaving home -> Not at all confident leaving home 0  Sleep soundly -> Do not sleep soundly because of lung condition 0  Lots of Energy -> No energy at all 3  TOTAL Score (max 40)  13     Simple office walk 185 feet x  3 laps goal with forehead probe 10/25/2017  11/20/2017  04/23/2019 06/18/2021   O2 used Room air Room air Room air ra  Number laps completed 2 and desaturated 2 and desaturated 1 and deaurates 1 lap only  Comments about pace Normal with limp Normal pace normla pace Slow pace  Resting Pulse Ox/HR 96% and 63/min 95% and 61/min 97% and 75/min 99% and 63  Final Pulse Ox/HR 84% and 81/min 87% and 85 at 2nd lap 88% and and 89/min 93% and 83  Desaturated </= 88% yes yes    Desaturated <= 3% points yes yes    Got Tachycardic >/= 90/min no no    Symptoms at end of test No complaints No complaints dyspnea Mil dyspnea  Miscellaneous comments Corrected with 2L O2 correcte with 2nd lap mild Slow pace due to knee  pain      PFT     Latest Ref Rng & Units 11/20/2017    9:12 AM  PFT Results  FVC-Pre L 2.23   FVC-Predicted Pre % 59   FVC-Post L 2.36   FVC-Predicted Post % 62   Pre FEV1/FVC % % 61   Post FEV1/FCV % % 61   FEV1-Pre L 1.35   FEV1-Predicted Pre % 51   FEV1-Post L 1.43   DLCO uncorrected ml/min/mmHg 12.99   DLCO UNC% % 41   DLVA Predicted % 70   TLC L 5.79   TLC % Predicted % 84   RV % Predicted % 125        LAB RESULTS last 96 hours No results found.       has a past medical history of Acute gangrenous cholecystitis  s/p lap cholecystectomy 06/08/2017 (06/08/2017), Arthritis, Colon cancer (HCC), ED (erectile dysfunction), Emphysema of lung (HCC), Hyperlipemia, Hypertension, Prostate cancer (HCC), and Wears glasses.   reports that he quit smoking about 36 years ago. His smoking use included cigarettes. He started smoking about 72 years ago. He has a 36 pack-year smoking history. He has never used smokeless tobacco.  Past Surgical History:  Procedure Laterality Date   APPENDECTOMY     CARPAL TUNNEL RELEASE  5/12   lt   CARPAL TUNNEL RELEASE  12/13/2011   Procedure: CARPAL TUNNEL RELEASE;  Surgeon: Wyn Forster., MD;  Location: [facility];  Service: Orthopedics;  Laterality: Right;  and inject right wrist   CHOLECYSTECTOMY N/A 06/08/2017   Procedure: LAPAROSCOPIC CHOLECYSTECTOMY WITH LYSIS OF ADHESIONS;  Surgeon: Ovidio Kin, MD;  Location: [facility];  Service: General;  Laterality: N/A;   COLON SURGERY  1997   hemicolectomy-rt-ca   COLONOSCOPY     about 12 inches of colon removed due to colon cancer   IR GASTROSTOMY TUBE MOD SED  11/06/2019   IR GASTROSTOMY TUBE REMOVAL  01/22/2020   RIGHT/LEFT HEART CATH AND CORONARY ANGIOGRAPHY N/A 06/12/2023   Procedure: RIGHT/LEFT HEART CATH AND CORONARY ANGIOGRAPHY;  Surgeon: Tonny Bollman, MD;  [facility] CV [facility];  Service: Cardiovascular;  Laterality: N/A;    Allergies  Allergen Reactions   Colesevelam Other (See Comments)    increases TG, constipation   Fenofibrate Other (See Comments)     muscle aches   Rosuvastatin Other (See Comments)    Myalgias   Simvastatin Other (See Comments)    Myalgias   Amoxicillin-Pot Clavulanate Rash   Penicillins Swelling    Immunization History  Administered Date(s) Administered   Fluad Quad(high Dose 65+) 05/14/2019   Influenza, High Dose Seasonal PF 02/11/2017, 04/18/2018, 05/02/2022   Influenza, Quadrivalent, Recombinant, Inj, Pf 03/13/2020, 03/16/2021   PFIZER(Purple Top)SARS-COV-2  Vaccination 08/20/2019, 09/10/2019, 03/27/2020   Pneumococcal Conjugate-13 12/05/2013, 11/06/2014   Pneumococcal Polysaccharide-23 10/09/2020   Tdap 07/06/2009   Zoster, Live 12/01/2009    No family history on file.   Current Outpatient Medications:    amiodarone (PACERONE) 200 MG tablet, Take 1 tablet (200 mg total) by mouth daily., Disp: 90 tablet, Rfl: 3   apixaban (ELIQUIS) 5 MG TABS tablet, Take 1 tablet (5 mg total) by mouth 2 (two) times daily., Disp: 180 tablet, Rfl: 3   carvedilol (COREG) 3.125 MG tablet, Take 1 tablet (3.125 mg total) by mouth 2 (two) times daily with a meal., Disp: 180 tablet, Rfl: 3   losartan (COZAAR) 25 MG tablet, Take 0.5 tablets (12.5 mg total) by mouth daily., Disp:  90 tablet, Rfl: 3   Probiotic Product (PROBIOTIC PO), Take 1 capsule by mouth daily., Disp: , Rfl:    rosuvastatin (CRESTOR) 10 MG tablet, Take 1 tablet (10 mg total) by mouth daily., Disp: 90 tablet, Rfl: 3   VITAMIN D PO, Take 1 capsule by mouth daily., Disp: , Rfl:    Tiotropium Bromide Monohydrate (SPIRIVA RESPIMAT) 1.25 MCG/ACT AERS, Inhale 2 puffs into the lungs daily. (Patient not taking: Reported on 08/01/2023), Disp: 4 g, Rfl: 5      Objective:   Vitals:   08/01/23 1535  BP: (!) 143/68  Pulse: (!) 50  SpO2: 96%  Weight: 155 lb 6.4 oz (70.5 kg)  Height: 5' 10.5" (1.791 m)    Estimated body mass index is 21.98 kg/m as calculated from the following:   Height as of this encounter: 5' 10.5" (1.791 m).   Weight as of this encounter: 155 lb 6.4 oz (70.5 kg).  @WEIGHTCHANGE @  [employer]   08/01/23 1535  Weight: 155 lb 6.4 oz (70.5 kg)     Physical Exam   General: No distress. think O2 at rest: no Cane present: no Sitting in wheel chair: no Frail: yes Obese: noi Neuro: Alert and Oriented x 3. GCS 15. Speech normal Psych: Pleasant Resp:  Barrel Chest - yes.  Wheeze - no, Crackles - no, No overt respiratory distress CVS: Normal heart sounds. Murmurs - no Ext:  Stigmata of Connective Tissue Disease - n HEENT: Normal upper airway. PEERL +. No post nasal drip. HOPARSE VOIC        Assessment:       ICD-10-CM   1. Stage 2 moderate COPD by GOLD classification (HCC)  J44.9 CT Chest Wo Contrast    2. Nocturnal hypoxemia  G47.34 CT Chest Wo Contrast    3. Hoarseness of voice  R49.0 CT Chest Wo Contrast         Plan:     Patient Instructions  Stage 2 moderate COPD by GOLD classification (HCC) Emphysema   - stable   You are not on your inhalers spiriva because of lack of percevived benefit  Plan -  continue albuterol as needed    Multiple lung nodules on CT - stable jan 2019 to Nov 2019 with new nodule -> stable Nov 2020 Hx of supraglottis cancer  -No lung nodules and CT scan of the chest December 2022 but CT Dec 2024 showing some atlectasis  Plan -CT chest without contrast in Jan 2026   Hoarse voice - throat cancer s/p XRT Spring 2021  - ongoing no change  Plan  - per ENT  followup 12 months or sooner; after CT chest   FOLLOWUP No follow-ups on file.    SIGNATURE    Dr. Kalman Shan, M.D., F.C.C.P,  Pulmonary and Critical Care Medicine Staff Physician, [facility] Center Director - Interstitial Lung Disease  Program  Pulmonary Fibrosis [facility] [place], [place], [address]  Pager: [phone], If no answer or between  15:00h - 7:00h: call [phone] Telephone: [phone]  4:03 PM 08/01/2023

## 2023-08-02 ENCOUNTER — Ambulatory Visit: Payer: Medicare Other | Admitting: Cardiology

## 2023-08-03 ENCOUNTER — Ambulatory Visit: Payer: Medicare Other | Admitting: Cardiology

## 2023-08-27 NOTE — Progress Notes (Unsigned)
 Cardiology Office Note    Date:  08/29/2023  ID:  HALFORD GOETZKE, DOB December 22, 1932, MRN [id number] PCP:  [name], MD  Cardiologist:  Nicki Guadalajara, MD  Electrophysiologist:  None   Chief Complaint: Follow up for atrial fibrillation   History of Present Illness: .    Julian Brown is a 89 y.o. 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  On 06/08/2023 he presented to the emergency room for elevated heart rates in the 130s.  His EKG in the ED was overall difficult to discern type of SVT as he had wandering baseline.  He was given 5 mg of IV Lopressor which reportedly showed atrial flutter and reportedly fibrillation but spontaneously converted back to normal sinus rhythm with frequent PACs and PVCs.  Troponins were elevated at 1868  then 2131.  His CTA was negative for PE.  Patient was overall asymptomatic.  He denies chest pain or shortness of breath.  He underwent cardiac catheterization on 12/30 with PL CX 60%, M RCA 40%, P RCA 30% with recommendations for medical management.  Echo showed LVEF 40 to 45%, global hypokinesis and normal RV, felt to be tachycardia mediated.  Initially the plan was to start Eliquis but patient had episodes of hemoptysis following heart catheterization.  Recommended to retrial Eliquis as an outpatient as long as he has no further bleeding.  He is started on losartan 12.5 mg daily and carvedilol 3.125 mg twice daily.  Patient was seen in clinic on 06/22/2018 for follow-up.  He reported that he is doing well overall.  He denied any further chest pain or shortness of breath.  He denied any feelings of palpitations or increased heart rate he denied any problems of significant bleeding.  Patient's cardiac monitor was placed, after an episode of atrial fibrillation with RVR he was referred to A-fib clinic.  He was seen  on 07/20/2023.  He was started on amiodarone 200 mg daily and continued on carvedilol 3.125 mg twice daily.  Cardiac monitor worn for 26 days indicated predominant rhythm was sinus rhythm with average rate of 68 bpm and slowest heart rate of 45 bpm he had a total of 10 hours and 33 minutes of atrial fibrillation with an A-fib burden of 2%.  Longest A-fib episode lasted 1 hour and 5 minutes with fastest A-fib at 160 bpm, slowest was 48 with a mean of 82 bpm there was a 5% PVC burden 18% PAC burden there were no prolonged pauses.  Today patient presents for follow-up with his wife and daughter.  He reports that he has been doing very well.  He denies any chest pain, shortness of breath, lower extremity edema, orthopnea or PND.  He denies any feelings of atrial fibrillation or palpitations.  Patient's daughter and wife have no concerns today, feel that he has been doing very well overall.  They do note he is overall not very active, patient notes that he has problems with his knee.  ROS: .   Today he denies chest pain, shortness of breath, lower extremity edema, fatigue, palpitations, melena, hematuria, hemoptysis, diaphoresis, weakness, presyncope, syncope, orthopnea, and PND.  All other systems are reviewed and otherwise negative. Studies Reviewed: Marland Kitchen    EKG:  EKG is ordered today, personally reviewed, demonstrating  EKG Interpretation Date/Time:  Monday August 28 2023 16:06:33 EDT Ventricular Rate:  55 PR Interval:  186 QRS Duration:  84 QT Interval:  422 QTC Calculation: 403 R Axis:   3  Text Interpretation: Sinus bradycardia When compared with ECG of 20-Jul-2023 15:50, Premature ventricular complexes are no longer Present Premature atrial complexes are no longer Present Confirmed by Reather Littler [phone]) on 08/29/2023 5:00:45 PM   CV Studies: Cardiac studies reviewed are outlined and summarized above. Otherwise please see EMR for full report. Cardiac Studies & Procedures    ______________________________________________________________________________________________ CARDIAC CATHETERIZATION  CARDIAC CATHETERIZATION 06/12/2023  Narrative   Ost RCA to Prox RCA lesion is 30% stenosed.   Mid RCA lesion is 40% stenosed.   Prox Cx lesion is 60% stenosed.  Patent left main with no stenosis Patent LAD with no stenosis Moderate stenosis of the proximal circumflex, estimated at 60-70%, appropriate for medical therapy Patent RCA (dominant vessel) with mild nonobstructive plaquing (30% proximal and 40% mid-vessel stenosis) Normal LVEDP  Recommendations: start apixaban tomorrow morning for new atrial fibrillation. Med Rx for CAD.  Findings Coronary Findings Diagnostic  Dominance: Right  Left Main The vessel exhibits minimal luminal irregularities.  Left Anterior Descending The vessel exhibits minimal luminal irregularities.  Left Circumflex Prox Cx lesion is 60% stenosed.  Right Coronary Artery Ost RCA to Prox RCA lesion is 30% stenosed. Mid RCA lesion is 40% stenosed.  Intervention  No interventions have been documented.     ECHOCARDIOGRAM  ECHOCARDIOGRAM COMPLETE 06/09/2023  Narrative ECHOCARDIOGRAM REPORT    Patient Name:   Julian Brown [facility] Date of Exam: 06/09/2023 Medical Rec #:  [id number]       Height:       69.0 in Accession #:    [id number]      Weight:       156.5 lb Date of Birth:  1932/06/14      BSA:          1.861 m Patient Age:    89 years        BP:           144/70 mmHg Patient Gender: M               HR:           82 bpm. Exam Location:  Inpatient  Procedure: 2D Echo  Indications:    nstemi  History:        Patient has no prior history of Echocardiogram examinations.  Sonographer:    Lucendia Herrlich Referring Phys: [id number] Eyvonne Left  IMPRESSIONS   1. Left ventricular ejection fraction, by estimation, is 40 to 45%. The left ventricle has mildly decreased function. The left ventricle demonstrates global  hypokinesis. The left ventricular internal cavity size was mildly dilated. Left ventricular diastolic parameters are indeterminate. 2. Right ventricular systolic function is normal. The right ventricular size is normal. 3. Left atrial size was mildly dilated. 4. The mitral valve is normal in structure. Mild mitral valve regurgitation. No evidence of mitral stenosis. 5. The aortic valve is tricuspid. Aortic valve regurgitation is not visualized. Aortic valve sclerosis/calcification is present, without any evidence of aortic stenosis. 6. The inferior vena cava is dilated in size with >50% respiratory variability, suggesting right atrial pressure of 8 mmHg.  Comparison(s): No prior Echocardiogram.  FINDINGS Left Ventricle: Left ventricular ejection fraction, by estimation, is 40 to 45%. The left ventricle has mildly decreased function. The left ventricle demonstrates global hypokinesis. The left ventricular internal cavity size was mildly dilated. There is no left ventricular hypertrophy. Left ventricular diastolic parameters are indeterminate.  Right Ventricle: The right ventricular  size is normal. Right ventricular systolic function is normal.  Left Atrium: Left atrial size was mildly dilated.  Right Atrium: Right atrial size was normal in size.  Pericardium: There is no evidence of pericardial effusion.  Mitral Valve: The mitral valve is normal in structure. Mild mitral valve regurgitation. No evidence of mitral valve stenosis.  Tricuspid Valve: The tricuspid valve is normal in structure. Tricuspid valve regurgitation is trivial. No evidence of tricuspid stenosis.  Aortic Valve: The aortic valve is tricuspid. Aortic valve regurgitation is not visualized. Aortic valve sclerosis/calcification is present, without any evidence of aortic stenosis. Aortic valve peak gradient measures 7.8 mmHg.  Pulmonic Valve: The pulmonic valve was normal in structure. Pulmonic valve regurgitation is mild. No  evidence of pulmonic stenosis.  Aorta: The aortic root is normal in size and structure.  Venous: The inferior vena cava is dilated in size with greater than 50% respiratory variability, suggesting right atrial pressure of 8 mmHg.  IAS/Shunts: No atrial level shunt detected by color flow Doppler.   LEFT VENTRICLE PLAX 2D LVIDd:         5.40 cm     Diastology LVIDs:         4.40 cm     LV e' medial:    9.03 cm/s LV PW:         0.90 cm     LV E/e' medial:  7.0 LV IVS:        0.60 cm     LV e' lateral:   12.00 cm/s LVOT diam:     2.10 cm     LV E/e' lateral: 5.3 LV SV:         65 LV SV Index:   35 LVOT Area:     3.46 cm  LV Volumes (MOD) LV vol d, MOD A2C: 84.2 ml LV vol d, MOD A4C: 62.0 ml LV vol s, MOD A2C: 41.9 ml LV vol s, MOD A4C: 29.7 ml LV SV MOD A2C:     42.3 ml LV SV MOD A4C:     62.0 ml LV SV MOD BP:      39.1 ml  RIGHT VENTRICLE             IVC RV S prime:     16.80 cm/s  IVC diam: 2.20 cm TAPSE (M-mode): 1.9 cm  LEFT ATRIUM             Index        RIGHT ATRIUM           Index LA diam:        2.80 cm 1.50 cm/m   RA Area:     15.40 cm LA Vol (A2C):   74.3 ml 39.91 ml/m  RA Volume:   36.00 ml  19.34 ml/m LA Vol (A4C):   44.9 ml 24.12 ml/m LA Biplane Vol: 61.9 ml 33.25 ml/m AORTIC VALVE AV Area (Vmax): 2.47 cm AV Vmax:        140.00 cm/s AV Peak Grad:   7.8 mmHg LVOT Vmax:      100.00 cm/s LVOT Vmean:     63.200 cm/s LVOT VTI:       0.188 m  AORTA Ao Root diam: 3.40 cm Ao Asc diam:  3.20 cm  MITRAL VALVE MV Area (PHT): 2.62 cm    SHUNTS MV Decel Time: 289 msec    Systemic VTI:  0.19 m MR Peak grad: 28.4 mmHg    Systemic Diam: 2.10 cm MR Vmax:  266.50 cm/s MV E velocity: 63.20 cm/s MV A velocity: 46.40 cm/s MV E/A ratio:  1.36  Olga Millers MD Electronically signed by Olga Millers MD Signature Date/Time: 06/09/2023/3:55:35 PM    Final    MONITORS  CARDIAC EVENT MONITOR 07/27/2023  Narrative The patient was monitored from  January 10 through July 24, 2023 (26 days, 10 hours and 49 minutes).  The predominant rhythm was sinus rhythm with an average rate at 68 and slowest heart rate 45 bpm.  Patient had a total of 10 hours and 33 minutes in atrial fibrillation with an AF burden of 2%.  The longest AF episode lasted 1 hour and 5 minutes with the fastest AF at 160 bpm on July 11, 2023.  This slowest AF was 48 bpm with mean of 82 bpm.  There was a 5% PVC burden and 18% PAC burden.  There were no prolonged pauses.       ______________________________________________________________________________________________       Current Reported Medications:.    Current Meds  Medication Sig   amiodarone (PACERONE) 200 MG tablet Take 1 tablet (200 mg total) by mouth daily.   apixaban (ELIQUIS) 5 MG TABS tablet Take 1 tablet (5 mg total) by mouth 2 (two) times daily.   carvedilol (COREG) 3.125 MG tablet Take 1 tablet (3.125 mg total) by mouth 2 (two) times daily with a meal.   losartan (COZAAR) 25 MG tablet Take 0.5 tablets (12.5 mg total) by mouth daily.   Probiotic Product (PROBIOTIC PO) Take 1 capsule by mouth daily.   rosuvastatin (CRESTOR) 10 MG tablet Take 1 tablet (10 mg total) by mouth daily.   VITAMIN D PO Take 1 capsule by mouth daily.    Physical Exam:    VS:  BP (!) 118/56 (BP Location: Right Arm, Patient Position: Sitting, Cuff Size: Normal)   Pulse (!) 55   Ht 5\' 11"  (1.803 m)   Wt 157 lb (71.2 kg)   SpO2 95%   BMI 21.90 kg/m    Wt Readings from Last 3 Encounters:  08/28/23 157 lb (71.2 kg)  08/01/23 155 lb 6.4 oz (70.5 kg)  07/20/23 159 lb 6.4 oz (72.3 kg)    GEN: Well nourished, well developed in no acute distress NECK: No JVD; No carotid bruits CARDIAC: RRR, no murmurs, rubs, gallops RESPIRATORY:  Clear to auscultation without rales, wheezing or rhonchi  ABDOMEN: Soft, non-tender, non-distended EXTREMITIES:  No edema; No acute deformity     Asessement and Plan:.    pAF/Atrial flutter:  Patient admitted in 05/2023 with possible atrial fibrillation versus atrial flutter.  Patient was started on Eliquis 5 mg twice daily.  Cardiac monitor indicated a 2% A-fib burden, patient was referred to A-fib clinic and started on amiodarone 200 mg daily. EKG today indicates sinus bradycardia at 55 bpm.  Patient reports that he is doing very well.  He denies any feelings of palpitations or atrial fibrillation.  He reports he is tolerating amiodarone well. He denies any bleeding problems on Eliquis. Continue Eliquis 5 mg twice daily and amiodarone 200 mg daily. Check CBC, c-Met, TSH. CHA2DS2-VASc Score = 4 [CHF History: 1, HTN History: 0, Diabetes History: 0, Stroke History: 0, Vascular Disease History: 1, Age Score: 2, Gender Score: 0].  Therefore, the patient's annual risk of stroke is 4.8 %.      CAD: Cardiac cath on 06/12/2023 with PL CX 60%, mRCA 40%, P RCA 30%, recommended for medical management.  Elevations in troponin felt due to tachycardia.  Today patient denies any chest pain or shortness of breath.  Reviewed ED precautions.  Continue carvedilol 3.125 mg twice daily and statin.  HFrEF/NICM: Echo during admission indicated LVEF 40 to 45%, global hypokinesis and normal RV.  Felt to be tachycardia mediated. Today he denies any shortness of breath, orthopnea, PND or lower extremity edema.  He will continue to monitor his blood pressure at home, he will notify the office of increased lower extremity edema, shortness of breath or weight gain of 2 to 3 pounds overnight or 5 pounds in a week. Continue carvedilol 3.125 mg twice daily and losartan 12.5 mg daily.  Malignant neoplasm of supraglottis: History of partial mass removal and radiation, follows with oncology.  Reportedly has had no evidence of recurrence.  During admission in December patient had a few episodes of hemoptysis while on IV heparin. Today he denies any bleeding problems on Eliquis. Patient will continue to monitor for blood in his  sputum.   COPD/sleep short breathing: Previously noted of pulmonary hypertension on prior imaging and noted to have nocturnal hypoxemia on chart. Patient follows with pulmonology.  Hyperlipidemia: Last lipid profile in 06/09/2023 indicated total cholesterol 161, HDL 43, triglycerides 54 and LDL 107.  Started rosuvastatin 10 mg daily during his hospitalization. Check fasting lipid profile and CMET.     Disposition: F/u with Reather Littler, NP in three months.   Signed, Rip Harbour, NP

## 2023-08-28 ENCOUNTER — Ambulatory Visit: Payer: Medicare Other | Attending: Cardiology | Admitting: Cardiology

## 2023-08-28 VITALS — BP 118/56 | HR 55 | Ht 71.0 in | Wt 157.0 lb

## 2023-08-28 DIAGNOSIS — D6869 Other thrombophilia: Secondary | ICD-10-CM

## 2023-08-28 DIAGNOSIS — I2510 Atherosclerotic heart disease of native coronary artery without angina pectoris: Secondary | ICD-10-CM

## 2023-08-28 DIAGNOSIS — C321 Malignant neoplasm of supraglottis: Secondary | ICD-10-CM | POA: Diagnosis present

## 2023-08-28 DIAGNOSIS — I484 Atypical atrial flutter: Secondary | ICD-10-CM | POA: Diagnosis present

## 2023-08-28 DIAGNOSIS — G4730 Sleep apnea, unspecified: Secondary | ICD-10-CM | POA: Diagnosis present

## 2023-08-28 DIAGNOSIS — I480 Paroxysmal atrial fibrillation: Secondary | ICD-10-CM

## 2023-08-28 DIAGNOSIS — E782 Mixed hyperlipidemia: Secondary | ICD-10-CM

## 2023-08-28 DIAGNOSIS — J449 Chronic obstructive pulmonary disease, unspecified: Secondary | ICD-10-CM | POA: Diagnosis present

## 2023-08-28 DIAGNOSIS — I5020 Unspecified systolic (congestive) heart failure: Secondary | ICD-10-CM

## 2023-08-28 NOTE — Patient Instructions (Signed)
 Medication Instructions:  No changes *If you need a refill on your cardiac medications before your next appointment, please call your pharmacy*   Lab Work: CBC,CMET,TSH,Lipid Panel If you have labs (blood work) drawn today and your tests are completely normal, you will receive your results only by: MyChart Message (if you have MyChart) OR A paper copy in the mail If you have any lab test that is abnormal or we need to change your treatment, we will call you to review the results.   Testing/Procedures: No Testing   Follow-Up: At [facility], you and your health needs are our priority.  As part of our continuing mission to provide you with exceptional heart care, we have created designated Provider Care Teams.  These Care Teams include your primary Cardiologist (physician) and Advanced Practice Providers (APPs -  Physician Assistants and Nurse Practitioners) who all work together to provide you with the care you need, when you need it.  We recommend signing up for the patient portal called "MyChart".  Sign up information is provided on this After Visit Summary.  MyChart is used to connect with patients for Virtual Visits (Telemedicine).  Patients are able to view lab/test results, encounter notes, upcoming appointments, etc.  Non-urgent messages can be sent to your provider as well.   To learn more about what you can do with MyChart, go to [email or web address].    Your next appointment:   3 month(s)  Provider:   Reather Littler, NP       Other Instructions   1st Floor: - Lobby - Registration  - Pharmacy  - Lab - Cafe  2nd Floor: - PV Lab - Diagnostic Testing (echo, CT, nuclear med)  3rd Floor: - Vacant  4th Floor: - TCTS (cardiothoracic surgery) - AFib Clinic - Structural Heart Clinic - Vascular Surgery  - Vascular Ultrasound  5th Floor: - [facility] (general and EP) - Clinical Pharmacy for coumadin, hypertension, lipid, weight-loss  medications, and med management appointments    Valet parking services will be available as well.

## 2023-08-29 ENCOUNTER — Encounter: Payer: Self-pay | Admitting: Cardiology

## 2023-09-09 LAB — COMPREHENSIVE METABOLIC PANEL WITH GFR
ALT: 14 IU/L (ref 0–44)
AST: 19 IU/L (ref 0–40)
Albumin: 4.1 g/dL (ref 3.7–4.7)
Alkaline Phosphatase: 75 IU/L (ref 44–121)
BUN/Creatinine Ratio: 16 (ref 10–24)
BUN: 17 mg/dL (ref 8–27)
Bilirubin Total: 0.6 mg/dL (ref 0.0–1.2)
CO2: 27 mmol/L (ref 20–29)
Calcium: 9.9 mg/dL (ref 8.6–10.2)
Chloride: 101 mmol/L (ref 96–106)
Creatinine, Ser: 1.04 mg/dL (ref 0.76–1.27)
Globulin, Total: 2.2 g/dL (ref 1.5–4.5)
Glucose: 93 mg/dL (ref 70–99)
Potassium: 4.7 mmol/L (ref 3.5–5.2)
Sodium: 140 mmol/L (ref 134–144)
Total Protein: 6.3 g/dL (ref 6.0–8.5)
eGFR: 69 mL/min/{1.73_m2} (ref >59)

## 2023-09-09 LAB — CBC
Hematocrit: 45.1 % (ref 37.5–51.0)
Hemoglobin: 15.1 g/dL (ref 13.0–17.7)
MCH: 34.2 pg — ABNORMAL HIGH (ref 26.6–33.0)
MCHC: 33.5 g/dL (ref 31.5–35.7)
MCV: 102 fL — ABNORMAL HIGH (ref 79–97)
Platelets: 212 10*3/uL (ref 150–450)
RBC: 4.41 x10E6/uL (ref 4.14–5.80)
RDW: 12.5 % (ref 11.6–15.4)
WBC: 6.3 10*3/uL (ref 3.4–10.8)

## 2023-09-09 LAB — TSH: TSH: 3.76 u[IU]/mL (ref 0.450–4.500)

## 2023-09-09 LAB — LIPID PANEL
Chol/HDL Ratio: 2.6 ratio (ref 0.0–5.0)
Cholesterol, Total: 131 mg/dL (ref 100–199)
HDL: 51 mg/dL (ref >39)
LDL Chol Calc (NIH): 68 mg/dL (ref 0–99)
Triglycerides: 55 mg/dL (ref 0–149)
VLDL Cholesterol Cal: 12 mg/dL (ref 5–40)

## 2023-10-18 ENCOUNTER — Ambulatory Visit (HOSPITAL_COMMUNITY): Payer: Medicare Other | Admitting: Physician Assistant

## 2023-10-19 ENCOUNTER — Encounter (HOSPITAL_COMMUNITY): Payer: Self-pay | Admitting: Physician Assistant

## 2023-10-19 ENCOUNTER — Ambulatory Visit (HOSPITAL_COMMUNITY)
Admission: RE | Admit: 2023-10-19 | Discharge: 2023-10-19 | Disposition: A | Discharge status: Home / Self Care | Payer: Medicare Other | Source: Ambulatory Visit | Attending: Physician Assistant | Admitting: Physician Assistant

## 2023-10-19 VITALS — BP 140/90 | HR 54 | Ht 71.0 in | Wt 153.0 lb

## 2023-10-19 DIAGNOSIS — D6869 Other thrombophilia: Secondary | ICD-10-CM | POA: Diagnosis not present

## 2023-10-19 DIAGNOSIS — Z79899 Other long term (current) drug therapy: Secondary | ICD-10-CM | POA: Insufficient documentation

## 2023-10-19 DIAGNOSIS — Z5181 Encounter for therapeutic drug level monitoring: Secondary | ICD-10-CM | POA: Diagnosis not present

## 2023-10-19 DIAGNOSIS — I480 Paroxysmal atrial fibrillation: Secondary | ICD-10-CM | POA: Insufficient documentation

## 2023-10-19 MED ORDER — AMIODARONE HCL 200 MG PO TABS: 100.0000 mg | ORAL_TABLET | Freq: Every day | ORAL | 3 refills | Status: AC

## 2023-10-19 NOTE — Patient Instructions (Signed)
 Decrease amiodarone  100mg  once a day (1/2 of the 200mg  tablet)

## 2023-10-19 NOTE — Progress Notes (Signed)
 Primary Care Physician: Avva, Ravisankar, MD Primary Cardiologist: Magnus Schuller, MD Electrophysiologist: None  Referring Physician: Dr Zeke Hick is a 89 y.o. male with a history of CAD, HFrEF, COPD, HLD, atrial fibrillation who presents for follow up in the [facility]. On 06/08/2023 he presented to the emergency room for elevated heart rates in the 130s. His EKG in the ED was overall difficult to discern type of SVT as he had wandering baseline. He was given 5 mg of IV Lopressor  which reportedly showed atrial flutter and then atrial fibrillation but spontaneously converted back to normal sinus rhythm. Troponin was elevated at 1868  then 2131. He underwent R/LHC which showed non obstructive disease, medical management recommended. Echo showed EF 40-45%, global hypokinesis. Initial recommendation was to start on Eliquis  after his LHC but he had an episode of hemoptysis. He was started on Eliquis  after discharge on 06/23/23.   An event monitor was also placed and on 07/11/23 there was an alert for atrial flutter with RVR on the event monitor. Reviewed by Dr Alda Amas who recommended AF clinic visit. He was started on amiodarone  07/20/23. Patient is on Eliquis  for stroke prevention.   Patient returns for follow up for atrial fibrillation and amiodarone  monitoring. He reports that he has done well since his last visit. He did have dizziness one morning about a month ago but it resolved after 1-2 hours. His daughter has noticed lower heart rates at home in the 40's at times. No bleeding issues on anticoagulation.   Today, he  denies symptoms of palpitations, chest pain, shortness of breath, orthopnea, PND, lower extremity edema, presyncope, syncope, snoring, daytime somnolence, bleeding, or neurologic sequela. The patient is tolerating medications without difficulties and is otherwise without complaint today.    Atrial Fibrillation Risk Factors:  he does not  have symptoms or diagnosis of sleep apnea. he does not have a history of rheumatic fever.   Atrial Fibrillation Management history:  Previous antiarrhythmic drugs: amiodarone   Previous cardioversions: none Previous ablations: none Anticoagulation history: Eliquis   ROS- All systems are reviewed and negative except as per the HPI above.  Past Medical History:  Diagnosis Date   Acute gangrenous cholecystitis s/p lap cholecystectomy 06/08/2017 06/08/2017   Arthritis    Colon cancer [facility])    ED (erectile dysfunction)    Emphysema of lung (HCC)    Hyperlipemia    Hypertension    Prostate cancer (HCC)    prostate cancer-tx radiation   Wears glasses     Current Outpatient Medications  Medication Sig Dispense Refill   amiodarone  (PACERONE ) 200 MG tablet Take 1 tablet (200 mg total) by mouth daily. 90 tablet 3   apixaban  (ELIQUIS ) 5 MG TABS tablet Take 1 tablet (5 mg total) by mouth 2 (two) times daily. 180 tablet 3   carvedilol  (COREG ) 3.125 MG tablet Take 1 tablet (3.125 mg total) by mouth 2 (two) times daily with a meal. 180 tablet 3   losartan  (COZAAR ) 25 MG tablet Take 0.5 tablets (12.5 mg total) by mouth daily. 90 tablet 3   Probiotic Product (PROBIOTIC PO) Take 1 capsule by mouth daily.     rosuvastatin  (CRESTOR ) 10 MG tablet Take 1 tablet (10 mg total) by mouth daily. 90 tablet 3   Tiotropium Bromide  Monohydrate (SPIRIVA  RESPIMAT) 1.25 MCG/ACT AERS Inhale 2 puffs into the lungs daily. 4 g 5   VITAMIN D  PO Take 1 capsule by mouth daily.  No current facility-administered medications for this encounter.    Physical Exam: BP (!) 140/90   Pulse (!) 54   Ht 5\' 11"  (1.803 m)   Wt 69.4 kg   BMI 21.34 kg/m   GEN: Well nourished, well developed in no acute distress CARDIAC: Regular rate and rhythm with occasional ectopy, no murmurs, rubs, gallops RESPIRATORY:  Clear to auscultation without rales, wheezing or rhonchi  ABDOMEN: Soft, non-tender, non-distended EXTREMITIES:   No edema; No deformity    Wt Readings from Last 3 Encounters:  10/19/23 69.4 kg  08/28/23 71.2 kg  08/01/23 70.5 kg     EKG today demonstrates  SB, PACs Vent. rate 54 BPM PR interval 168 ms QRS duration 82 ms QT/QTcB 430/407 ms   Echo 06/09/23 demonstrated   1. Left ventricular ejection fraction, by estimation, is 40 to 45%. The  left ventricle has mildly decreased function. The left ventricle  demonstrates global hypokinesis. The left ventricular internal cavity size  was mildly dilated. Left ventricular diastolic parameters are indeterminate.   2. Right ventricular systolic function is normal. The right ventricular  size is normal.   3. Left atrial size was mildly dilated.   4. The mitral valve is normal in structure. Mild mitral valve  regurgitation. No evidence of mitral stenosis.   5. The aortic valve is tricuspid. Aortic valve regurgitation is not  visualized. Aortic valve sclerosis/calcification is present, without any  evidence of aortic stenosis.   6. The inferior vena cava is dilated in size with >50% respiratory  variability, suggesting right atrial pressure of 8 mmHg.   Comparison(s): No prior Echocardiogram.    CHA2DS2-VASc Score = 4  The patient's score is based upon: CHF History: 1 HTN History: 0 Diabetes History: 0 Stroke History: 0 Vascular Disease History: 1 Age Score: 2 Gender Score: 0       ASSESSMENT AND PLAN: Paroxysmal Atrial Fibrillation/atrial flutter The patient's CHA2DS2-VASc score is 4, indicating a 4.8% annual risk of stroke.   Patient appear to be maintaining SR. Will decrease amiodarone  to 100 mg daily given bradycardia. Continue carvedilol  3.125 mg BID Continue Eliquis  5 mg BID Recall with h/o supraglottic cancer swallowing capsules is difficult.   Secondary Hypercoagulable State (ICD10:  D68.69) The patient is at significant risk for stroke/thromboembolism based upon his CHA2DS2-VASc Score of 4.  Continue Apixaban  (Eliquis ).    High Risk Medication Monitoring (ICD 10: Z79.899) Intervals on ECG acceptable for amiodarone  monitoring.Cmet and TSH reviewed.   Chronic HFrEF EF 40-45% GDMT per primary cardiology team Suspected tachycardia mediated  Fluid status appears stable today  CAD LHC 06/12/23 showed 60-70% stenosis in circumflex and mild plaque in RCA. No anginal symptoms Followed by Dr Loetta Ringer   Follow up in the AF clinic in 6 months.       Myrtha Ates PA-C Afib Clinic [facility] [address] [place], [place] [address] [phone]

## 2023-10-23 ENCOUNTER — Ambulatory Visit: Payer: Medicare Other | Admitting: Orthopaedic Surgery

## 2023-10-25 ENCOUNTER — Ambulatory Visit: Admitting: Orthopaedic Surgery

## 2023-11-04 ENCOUNTER — Other Ambulatory Visit: Payer: Self-pay

## 2023-11-04 ENCOUNTER — Emergency Department (HOSPITAL_COMMUNITY)

## 2023-11-04 ENCOUNTER — Observation Stay (HOSPITAL_COMMUNITY)
Admission: EM | Admit: 2023-11-04 | Discharge: 2023-11-05 | Disposition: A | Discharge status: Home / Self Care | Attending: Internal Medicine | Admitting: Internal Medicine

## 2023-11-04 ENCOUNTER — Encounter (HOSPITAL_COMMUNITY): Payer: Self-pay | Admitting: *Deleted

## 2023-11-04 DIAGNOSIS — G9341 Metabolic encephalopathy: Secondary | ICD-10-CM | POA: Diagnosis not present

## 2023-11-04 DIAGNOSIS — R5381 Other malaise: Principal | ICD-10-CM | POA: Insufficient documentation

## 2023-11-04 DIAGNOSIS — Z79899 Other long term (current) drug therapy: Secondary | ICD-10-CM | POA: Insufficient documentation

## 2023-11-04 DIAGNOSIS — I110 Hypertensive heart disease with heart failure: Secondary | ICD-10-CM | POA: Diagnosis not present

## 2023-11-04 DIAGNOSIS — Y92009 Unspecified place in unspecified non-institutional (private) residence as the place of occurrence of the external cause: Secondary | ICD-10-CM | POA: Insufficient documentation

## 2023-11-04 DIAGNOSIS — J449 Chronic obstructive pulmonary disease, unspecified: Secondary | ICD-10-CM | POA: Insufficient documentation

## 2023-11-04 DIAGNOSIS — W19XXXA Unspecified fall, initial encounter: Principal | ICD-10-CM

## 2023-11-04 DIAGNOSIS — I480 Paroxysmal atrial fibrillation: Secondary | ICD-10-CM | POA: Insufficient documentation

## 2023-11-04 DIAGNOSIS — I2510 Atherosclerotic heart disease of native coronary artery without angina pectoris: Secondary | ICD-10-CM | POA: Insufficient documentation

## 2023-11-04 DIAGNOSIS — Z7901 Long term (current) use of anticoagulants: Secondary | ICD-10-CM | POA: Diagnosis not present

## 2023-11-04 DIAGNOSIS — Z85038 Personal history of other malignant neoplasm of large intestine: Secondary | ICD-10-CM | POA: Insufficient documentation

## 2023-11-04 DIAGNOSIS — Z8546 Personal history of malignant neoplasm of prostate: Secondary | ICD-10-CM | POA: Insufficient documentation

## 2023-11-04 DIAGNOSIS — Z8521 Personal history of malignant neoplasm of larynx: Secondary | ICD-10-CM | POA: Diagnosis not present

## 2023-11-04 DIAGNOSIS — R531 Weakness: Secondary | ICD-10-CM | POA: Diagnosis not present

## 2023-11-04 DIAGNOSIS — J9611 Chronic respiratory failure with hypoxia: Secondary | ICD-10-CM | POA: Diagnosis not present

## 2023-11-04 DIAGNOSIS — E785 Hyperlipidemia, unspecified: Secondary | ICD-10-CM | POA: Diagnosis not present

## 2023-11-04 DIAGNOSIS — Z87891 Personal history of nicotine dependence: Secondary | ICD-10-CM | POA: Diagnosis not present

## 2023-11-04 DIAGNOSIS — I5022 Chronic systolic (congestive) heart failure: Secondary | ICD-10-CM | POA: Diagnosis not present

## 2023-11-04 DIAGNOSIS — Z8709 Personal history of other diseases of the respiratory system: Secondary | ICD-10-CM

## 2023-11-04 DIAGNOSIS — R410 Disorientation, unspecified: Secondary | ICD-10-CM | POA: Diagnosis not present

## 2023-11-04 DIAGNOSIS — Z8679 Personal history of other diseases of the circulatory system: Secondary | ICD-10-CM

## 2023-11-04 LAB — URINALYSIS, ROUTINE W REFLEX MICROSCOPIC
Bilirubin Urine: NEGATIVE
Glucose, UA: NEGATIVE mg/dL
Ketones, ur: NEGATIVE mg/dL
Leukocytes,Ua: NEGATIVE
Nitrite: NEGATIVE
Protein, ur: 30 mg/dL — AB
Specific Gravity, Urine: 1.008 (ref 1.005–1.030)
pH: 7 (ref 5.0–8.0)

## 2023-11-04 LAB — CBC
HCT: 44.7 % (ref 39.0–52.0)
Hemoglobin: 14.7 g/dL (ref 13.0–17.0)
MCH: 33.3 pg (ref 26.0–34.0)
MCHC: 32.9 g/dL (ref 30.0–36.0)
MCV: 101.4 fL — ABNORMAL HIGH (ref 80.0–100.0)
Platelets: 167 10*3/uL (ref 150–400)
RBC: 4.41 MIL/uL (ref 4.22–5.81)
RDW: 13.3 % (ref 11.5–15.5)
WBC: 6.2 10*3/uL (ref 4.0–10.5)
nRBC: 0 % (ref 0.0–0.2)

## 2023-11-04 LAB — PROTIME-INR
INR: 1.6 — ABNORMAL HIGH (ref 0.8–1.2)
Prothrombin Time: 19.3 s — ABNORMAL HIGH (ref 11.4–15.2)

## 2023-11-04 LAB — COMPREHENSIVE METABOLIC PANEL WITH GFR
ALT: 21 U/L (ref 0–44)
AST: 22 U/L (ref 15–41)
Albumin: 3.7 g/dL (ref 3.5–5.0)
Alkaline Phosphatase: 48 U/L (ref 38–126)
Anion gap: 8 (ref 5–15)
BUN: 21 mg/dL (ref 8–23)
CO2: 26 mmol/L (ref 22–32)
Calcium: 9.5 mg/dL (ref 8.9–10.3)
Chloride: 103 mmol/L (ref 98–111)
Creatinine, Ser: 1.1 mg/dL (ref 0.61–1.24)
GFR, Estimated: >60 mL/min (ref >60)
Glucose, Bld: 117 mg/dL — ABNORMAL HIGH (ref 70–99)
Potassium: 4.6 mmol/L (ref 3.5–5.1)
Sodium: 137 mmol/L (ref 135–145)
Total Bilirubin: 0.7 mg/dL (ref 0.0–1.2)
Total Protein: 6.6 g/dL (ref 6.5–8.1)

## 2023-11-04 LAB — I-STAT CHEM 8, ED
BUN: 24 mg/dL — ABNORMAL HIGH (ref 8–23)
Calcium, Ion: 1.25 mmol/L (ref 1.15–1.40)
Chloride: 100 mmol/L (ref 98–111)
Creatinine, Ser: 1.2 mg/dL (ref 0.61–1.24)
Glucose, Bld: 113 mg/dL — ABNORMAL HIGH (ref 70–99)
HCT: 45 % (ref 39.0–52.0)
Hemoglobin: 15.3 g/dL (ref 13.0–17.0)
Potassium: 4.5 mmol/L (ref 3.5–5.1)
Sodium: 138 mmol/L (ref 135–145)
TCO2: 28 mmol/L (ref 22–32)

## 2023-11-04 LAB — ETHANOL: Alcohol, Ethyl (B): <15 mg/dL (ref <15)

## 2023-11-04 LAB — I-STAT CG4 LACTIC ACID, ED: Lactic Acid, Venous: 1 mmol/L (ref 0.5–1.9)

## 2023-11-04 LAB — SAMPLE TO BLOOD BANK

## 2023-11-04 MED ORDER — ROSUVASTATIN CALCIUM 5 MG PO TABS
10.0000 mg | ORAL_TABLET | Freq: Every day | ORAL | Status: DC
  Administered 2023-11-05: 10 mg via ORAL
  Filled 2023-11-04: qty 2

## 2023-11-04 MED ORDER — LOSARTAN POTASSIUM 25 MG PO TABS: 12.5000 mg | ORAL_TABLET | Freq: Every day | ORAL | Status: DC

## 2023-11-04 MED ORDER — ACETAMINOPHEN 325 MG PO TABS: 650.0000 mg | ORAL_TABLET | Freq: Four times a day (QID) | ORAL | Status: DC | PRN

## 2023-11-04 MED ORDER — ONDANSETRON HCL 4 MG PO TABS: 4.0000 mg | ORAL_TABLET | Freq: Four times a day (QID) | ORAL | Status: DC | PRN

## 2023-11-04 MED ORDER — IPRATROPIUM-ALBUTEROL 0.5-2.5 (3) MG/3ML IN SOLN: 3.0000 mL | RESPIRATORY_TRACT | Status: DC | PRN

## 2023-11-04 MED ORDER — ONDANSETRON HCL 4 MG/2ML IJ SOLN: 4.0000 mg | Freq: Four times a day (QID) | INTRAMUSCULAR | Status: DC | PRN

## 2023-11-04 MED ORDER — ACETAMINOPHEN 325 MG PO TABS
650.0000 mg | ORAL_TABLET | Freq: Once | ORAL | Status: AC
  Administered 2023-11-04: 650 mg via ORAL
  Filled 2023-11-04: qty 2

## 2023-11-04 MED ORDER — LIDOCAINE 5 % EX PTCH
1.0000 | MEDICATED_PATCH | Freq: Once | CUTANEOUS | Status: AC
  Administered 2023-11-04: 3 via TRANSDERMAL
  Filled 2023-11-04: qty 3

## 2023-11-04 MED ORDER — HYDRALAZINE HCL 20 MG/ML IJ SOLN: 5.0000 mg | Freq: Four times a day (QID) | INTRAMUSCULAR | Status: DC | PRN

## 2023-11-04 MED ORDER — AMIODARONE HCL 200 MG PO TABS
100.0000 mg | ORAL_TABLET | Freq: Every day | ORAL | Status: DC
  Filled 2023-11-04: qty 1

## 2023-11-04 MED ORDER — SODIUM CHLORIDE 0.9 % IV SOLN: 250.0000 mL | INTRAVENOUS | Status: DC | PRN

## 2023-11-04 MED ORDER — SODIUM CHLORIDE 0.9% FLUSH: 3.0000 mL | Freq: Two times a day (BID) | INTRAVENOUS | Status: DC

## 2023-11-04 MED ORDER — CARVEDILOL 3.125 MG PO TABS: 3.1250 mg | ORAL_TABLET | Freq: Two times a day (BID) | ORAL | Status: DC

## 2023-11-04 MED ORDER — APIXABAN 5 MG PO TABS
5.0000 mg | ORAL_TABLET | Freq: Two times a day (BID) | ORAL | Status: DC
  Administered 2023-11-04 – 2023-11-05 (×2): 5 mg via ORAL
  Filled 2023-11-04 (×2): qty 1

## 2023-11-04 MED ORDER — SODIUM CHLORIDE 0.9% FLUSH: 3.0000 mL | INTRAVENOUS | Status: DC | PRN

## 2023-11-04 MED ORDER — ACETAMINOPHEN 650 MG RE SUPP: 650.0000 mg | Freq: Four times a day (QID) | RECTAL | Status: DC | PRN

## 2023-11-04 MED ORDER — LOSARTAN POTASSIUM 25 MG PO TABS
12.5000 mg | ORAL_TABLET | Freq: Every day | ORAL | Status: DC
Start: 2023-11-04 — End: 2023-11-05
  Filled 2023-11-04: qty 0.5

## 2023-11-04 MED ORDER — SODIUM CHLORIDE 0.9% FLUSH
3.0000 mL | Freq: Two times a day (BID) | INTRAVENOUS | Status: DC
  Administered 2023-11-05: 3 mL via INTRAVENOUS

## 2023-11-04 NOTE — ED Notes (Signed)
 Xray at [facility]

## 2023-11-04 NOTE — Progress Notes (Signed)
 Orthopedic Tech Progress Note Patient Details:  Julian Brown February 18, 1933 [id number]  Level II trauma, no ortho tech needs at this time.  Patient ID: Julian Brown, male   DOB: Jul 23, 1932, 89 y.o.   MRN: [id number]  Julian Brown 11/04/2023, 7:04 PM

## 2023-11-04 NOTE — H&P (Addendum)
 History and Physical    Julian Brown UXL:[id number] DOB: 1933/03/05 DOA: 11/04/2023  PCP: Avva, Ravisankar, MD   Patient coming from: Home   Chief Complaint:  Chief Complaint  Patient presents with   Trauma   ED TRIAGE note:  BIB [facility] from home as level 2 trauma s/p fall on blood thinners. Takes plavix. Was walking outside across cement and tripped. Fell onto buttocks. Denies ain. No obvious injuries or deformities. Denies complaints. Wears O2 at night. SPO2 on RA was 88%. Placed on [place] 2L by FD. Up to 96%. Dropped again to 88% when RA trialed again. Returned to 2L [place] 96%. Family reports usually A&Ox4. Currently A&Ox2. GCS 14 for confusion. C-collar in place. Wearing glasses. ETCO2 31. VSS. 18g NSL L AC. Arrives to full trauma team, alert, NAD, calm, interactive, resps e/u, speaking clearly. Skin W&D. Follows command, answers questions appropriately.         HPI:  Julian Brown is a 89 y.o. male with medical history significant of HFrEF 40 to 45%, COPD, chronic hypoxic respiratory failure 2 L oxygen at baseline, hyperlipidemia, CAD, history of prostate cancer, history of supraglottic cancer with chronic swallowing difficulty and chronic hoarseness of the voice presented to emergency department after a fall.  Initially patient was taken to ER as a level 2 trauma patient.Per EMS, the patient was walking in the neighborhood with his wife and he tripped and fell and landed on his bottom.  He denied hitting his head or losing consciousness.  EMS reports on their arrival he was ANO x 2 and per family his baseline is ANO x 4.  They also note that he was hypoxic to 88% on room air and placed him on 2 L nasal cannula.  Patient states that he had no lightheadedness or dizziness prior to the fall, denies any chest pain or shortness of breath or other recent illnesses.  He states that he does wear oxygen at night but did not know the reason why. Family at bedside state that it is normal for him not  to know day/time since he is retired and does not look at the calendar. States he is at his neurologic baseline.   Patient denies any headache, blurry vision, chest pain, shortness of breath, cough, palpitation, abdominal pain, nausea and vomiting.  No other complaint at this time.    ED Course:  At presentation to ED patient O2 sat 88% room air currently maintaining 92 to 94%, found to elevated blood pressure 189/83 otherwise hemodynamically stable. CBC unremarkable. CMP unremarkable. Elevated pro time INR 19.3 and 1.6. Lactic acid within normal range. UA no evidence of UTI.  EKG showed normal sinus rhythm heart rate 79 and premature atrial complex.  Extensive imaging included chest x-ray, x-ray pelvis, CT head and CT cervical spine no evidence of fracture and acute abnormality.  In the ED patient has been treated with transdermal lidocaine  patch and Tylenol  650 mg.  Patient continues to complaining about generalized weakness and confusion.  ED physician reported that patient is more confused as compared to his baseline right now per family.  Also he is too weak to ambulate in the hallway.  Hospitalist has been consulted for further evaluation management for generalized weakness and acute metabolic encephalopathy.   Significant labs in the ED: Lab Orders         Comprehensive metabolic panel         CBC         Ethanol  Urinalysis, Routine w reflex microscopic -Urine, Clean Catch         Protime-INR         Comprehensive metabolic panel         CBC         Ammonia         TSH         Blood gas, venous         I-Stat Chem 8, ED         I-Stat Lactic Acid, ED       Review of Systems:  Review of Systems  Neurological:  Positive for weakness.  All other systems reviewed and are negative.   Past Medical History:  Diagnosis Date   Acute gangrenous cholecystitis s/p lap cholecystectomy 06/08/2017 06/08/2017   Arthritis    Colon cancer [facility])    ED (erectile  dysfunction)    Emphysema of lung (HCC)    Hyperlipemia    Hypertension    Prostate cancer (HCC)    prostate cancer-tx radiation   Wears glasses     Past Surgical History:  Procedure Laterality Date   APPENDECTOMY     CARPAL TUNNEL RELEASE  5/12   lt   CARPAL TUNNEL RELEASE  12/13/2011   Procedure: CARPAL TUNNEL RELEASE;  Surgeon: Amelie Baize., MD;  Location: [facility];  Service: Orthopedics;  Laterality: Right;  and inject right wrist   CHOLECYSTECTOMY N/A 06/08/2017   Procedure: LAPAROSCOPIC CHOLECYSTECTOMY WITH LYSIS OF ADHESIONS;  Surgeon: Juanita Norlander, MD;  Location: [facility];  Service: General;  Laterality: N/A;   COLON SURGERY  1997   hemicolectomy-rt-ca   COLONOSCOPY     about 12 inches of colon removed due to colon cancer   IR GASTROSTOMY TUBE MOD SED  11/06/2019   IR GASTROSTOMY TUBE REMOVAL  01/22/2020   RIGHT/LEFT HEART CATH AND CORONARY ANGIOGRAPHY N/A 06/12/2023   Procedure: RIGHT/LEFT HEART CATH AND CORONARY ANGIOGRAPHY;  Surgeon: Arnoldo Lapping, MD;  Location: [facility];  Service: Cardiovascular;  Laterality: N/A;     reports that he quit smoking about 36 years ago. His smoking use included cigarettes. He started smoking about 72 years ago. He has a 36 pack-year smoking history. He has never used smokeless tobacco. He reports current alcohol use. He reports that he does not use drugs.  Allergies  Allergen Reactions   Colesevelam Other (See Comments)    increases TG, constipation   Fenofibrate Other (See Comments)     muscle aches   Rosuvastatin  Other (See Comments)    Myalgias   Simvastatin Other (See Comments)    Myalgias   Amoxicillin-Pot Clavulanate Rash   Penicillins Swelling    History reviewed. No pertinent family history.  Prior to Admission medications   Medication Sig Start Date End Date Taking? Authorizing Provider  amiodarone  (PACERONE ) 200 MG tablet Take 0.5 tablets (100 mg total) by mouth daily. 10/19/23   Fenton,  Clint R, PA  apixaban  (ELIQUIS ) 5 MG TABS tablet Take 1 tablet (5 mg total) by mouth 2 (two) times daily. 06/23/23   West, Katlyn D, NP  carvedilol  (COREG ) 3.125 MG tablet Take 1 tablet (3.125 mg total) by mouth 2 (two) times daily with a meal. 06/23/23   West, Katlyn D, NP  losartan  (COZAAR ) 25 MG tablet Take 0.5 tablets (12.5 mg total) by mouth daily. 06/23/23   West, Katlyn D, NP  Probiotic Product (PROBIOTIC PO) Take 1 capsule by mouth daily.  [provider]  rosuvastatin  (CRESTOR ) 10 MG tablet Take 1 tablet (10 mg total) by mouth daily. 06/23/23   West, Katlyn D, NP  Tiotropium Bromide  Monohydrate (SPIRIVA  RESPIMAT) 1.25 MCG/ACT AERS Inhale 2 puffs into the lungs daily. 05/31/22   Maire Scot, MD  VITAMIN D  PO Take 1 capsule by mouth daily.    [provider]     Physical Exam: Vitals:   11/04/23 1945 11/04/23 2015 11/04/23 2315 11/04/23 2326  BP: (!) 139/102 (!) 189/83 124/72   Pulse: 80 71 62   Resp: (!) 22 (!) 22 14   Temp:    98.1 F (36.7 C)  TempSrc:    Oral  SpO2: 94% 92% 96%   Weight:      Height:        Physical Exam Vitals reviewed.  Constitutional:      General: He is not in acute distress.    Appearance: He is not ill-appearing.  HENT:     Mouth/Throat:     Mouth: Mucous membranes are moist.  Cardiovascular:     Rate and Rhythm: Normal rate and regular rhythm.     Pulses: Normal pulses.     Heart sounds: Normal heart sounds.  Pulmonary:     Effort: Pulmonary effort is normal.     Breath sounds: Normal breath sounds. No wheezing.  Abdominal:     Palpations: Abdomen is soft.  Musculoskeletal:     Cervical back: Normal range of motion and neck supple.     Right lower leg: No edema.     Left lower leg: No edema.  Skin:    General: Skin is warm.     Capillary Refill: Capillary refill takes less than 2 seconds.  Neurological:     Mental Status: He is oriented to person, place, and time. Mental status is at baseline.     Cranial  Nerves: No cranial nerve deficit.     Sensory: No sensory deficit.     Motor: No weakness.  Psychiatric:        Mood and Affect: Mood normal.        Behavior: Behavior normal.        Thought Content: Thought content normal.        Judgment: Judgment normal.      Labs on Admission: I have personally reviewed following labs and imaging studies  CBC: Recent Labs  Lab 11/04/23 1842 11/04/23 1851  WBC 6.2  --   HGB 14.7 15.3  HCT 44.7 45.0  MCV 101.4*  --   PLT 167  --    Basic Metabolic Panel: Recent Labs  Lab 11/04/23 1842 11/04/23 1851  NA 137 138  K 4.6 4.5  CL 103 100  CO2 26  --   GLUCOSE 117* 113*  BUN 21 24*  CREATININE 1.10 1.20  CALCIUM  9.5  --    GFR: Estimated Creatinine Clearance: 41.8 mL/min (by C-G formula based on SCr of 1.2 mg/dL). Liver Function Tests: Recent Labs  Lab 11/04/23 1842  AST 22  ALT 21  ALKPHOS 48  BILITOT 0.7  PROT 6.6  ALBUMIN 3.7   No results for input(s): "LIPASE", "AMYLASE" in the last 168 hours. No results for input(s): "AMMONIA" in the last 168 hours. Coagulation Profile: Recent Labs  Lab 11/04/23 1842  INR 1.6*   Cardiac Enzymes: No results for input(s): "CKTOTAL", "CKMB", "CKMBINDEX", "TROPONINI", "TROPONINIHS" in the last 168 hours. BNP (last 3 results) Recent Labs    06/09/23 1224  BNP 590.0*   HbA1C: No results for input(s): "HGBA1C" in the last 72 hours. CBG: No results for input(s): "GLUCAP" in the last 168 hours. Lipid Profile: No results for input(s): "CHOL", "HDL", "LDLCALC", "TRIG", "CHOLHDL", "LDLDIRECT" in the last 72 hours. Thyroid  Function Tests: No results for input(s): "TSH", "T4TOTAL", "FREET4", "T3FREE", "THYROIDAB" in the last 72 hours. Anemia Panel: No results for input(s): "VITAMINB12", "FOLATE", "FERRITIN", "TIBC", "IRON", "RETICCTPCT" in the last 72 hours. Urine analysis:    Component Value Date/Time   COLORURINE YELLOW 11/04/2023 2134   APPEARANCEUR CLEAR 11/04/2023 2134    LABSPEC 1.008 11/04/2023 2134   PHURINE 7.0 11/04/2023 2134   GLUCOSEU NEGATIVE 11/04/2023 2134   HGBUR SMALL (A) 11/04/2023 2134   BILIRUBINUR NEGATIVE 11/04/2023 2134   KETONESUR NEGATIVE 11/04/2023 2134   PROTEINUR 30 (A) 11/04/2023 2134   UROBILINOGEN 1.0 11/27/2012 0044   NITRITE NEGATIVE 11/04/2023 2134   LEUKOCYTESUR NEGATIVE 11/04/2023 2134    Radiological Exams on Admission: I have personally reviewed images CT Head Wo Contrast Result Date: 11/04/2023 CLINICAL DATA:  Head and neck trauma EXAM: CT HEAD WITHOUT CONTRAST CT CERVICAL SPINE WITHOUT CONTRAST TECHNIQUE: Multidetector CT imaging of the head and cervical spine was performed following the standard protocol without intravenous contrast. Multiplanar CT image reconstructions of the cervical spine were also generated. RADIATION DOSE REDUCTION: This exam was performed according to the departmental dose-optimization program which includes automated exposure control, adjustment of the mA and/or kV according to patient size and/or use of iterative reconstruction technique. COMPARISON:  None Available. FINDINGS: CT HEAD FINDINGS Brain: No intracranial hemorrhage, mass effect, or evidence of acute infarct. No hydrocephalus. No extra-axial fluid collection. Age related cerebral atrophy and chronic small vessel ischemic disease. Chronic right basal ganglia infarct. Vascular: No hyperdense vessel. Intracranial arterial calcification. Skull: No fracture or focal lesion. Sinuses/Orbits: No acute finding. Other: None. CT CERVICAL SPINE FINDINGS Alignment: No evidence of traumatic malalignment. Skull base and vertebrae: No acute fracture. No primary bone lesion or focal pathologic process. Soft tissues and spinal canal: No prevertebral fluid or swelling. No visible canal hematoma. Disc levels: Multilevel age-related spondylosis and facet arthropathy. No severe spinal canal narrowing. Upper chest: Emphysema. Bilateral thyroid  nodules previously evaluated  with ultrasound and biopsied in 2018. Other: Carotid calcification. IMPRESSION: 1. No acute intracranial abnormality. 2. No acute fracture in the cervical spine. Electronically Signed   By: Rozell Cornet M.D.   On: 11/04/2023 19:34   CT Cervical Spine Wo Contrast Result Date: 11/04/2023 CLINICAL DATA:  Head and neck trauma EXAM: CT HEAD WITHOUT CONTRAST CT CERVICAL SPINE WITHOUT CONTRAST TECHNIQUE: Multidetector CT imaging of the head and cervical spine was performed following the standard protocol without intravenous contrast. Multiplanar CT image reconstructions of the cervical spine were also generated. RADIATION DOSE REDUCTION: This exam was performed according to the departmental dose-optimization program which includes automated exposure control, adjustment of the mA and/or kV according to patient size and/or use of iterative reconstruction technique. COMPARISON:  None Available. FINDINGS: CT HEAD FINDINGS Brain: No intracranial hemorrhage, mass effect, or evidence of acute infarct. No hydrocephalus. No extra-axial fluid collection. Age related cerebral atrophy and chronic small vessel ischemic disease. Chronic right basal ganglia infarct. Vascular: No hyperdense vessel. Intracranial arterial calcification. Skull: No fracture or focal lesion. Sinuses/Orbits: No acute finding. Other: None. CT CERVICAL SPINE FINDINGS Alignment: No evidence of traumatic malalignment. Skull base and vertebrae: No acute fracture. No primary bone lesion or focal pathologic process. Soft tissues and spinal canal: No prevertebral fluid  or swelling. No visible canal hematoma. Disc levels: Multilevel age-related spondylosis and facet arthropathy. No severe spinal canal narrowing. Upper chest: Emphysema. Bilateral thyroid  nodules previously evaluated with ultrasound and biopsied in 2018. Other: Carotid calcification. IMPRESSION: 1. No acute intracranial abnormality. 2. No acute fracture in the cervical spine. Electronically Signed    By: Rozell Cornet M.D.   On: 11/04/2023 19:34   DG Pelvis Portable Result Date: 11/04/2023 CLINICAL DATA:  Trauma EXAM: PORTABLE PELVIS 1-2 VIEWS COMPARISON:  PET CT 01/13/2020 FINDINGS: Limited evaluation due to overlapping osseous structures and overlying soft tissues. There is no evidence of pelvic fracture or diastasis. No acute displaced fracture or dislocation of the hips. No pelvic bone lesions are seen. Degenerative changes lower lumbar spine. Atherosclerotic plaque. IMPRESSION: Negative for acute traumatic injury. Limited evaluation due to overlapping osseous structures and overlying soft tissues. Electronically Signed   By: Morgane  Naveau M.D.   On: 11/04/2023 19:12   DG Chest Port 1 View Result Date: 11/04/2023 CLINICAL DATA:  Trauma EXAM: PORTABLE CHEST 1 VIEW COMPARISON:  Chest x-ray 06/09/2023 FINDINGS: The heart and mediastinal contours are unchanged. Atherosclerotic plaque. No focal consolidation. Chronic coarsened interstitial markings with no overt pulmonary edema. No pleural effusion. No pneumothorax. No acute osseous abnormality. IMPRESSION: 1. No active disease. 2. Aortic Atherosclerosis (ICD10-I70.0) and Emphysema (ICD10-J43.9). Electronically Signed   By: Morgane  Naveau M.D.   On: 11/04/2023 19:11     EKG: My personal interpretation of EKG shows: Normal sinus rhythm with premature atrial complex.    Assessment/Plan: Principal Problem:   Generalized weakness Active Problems:   Acute metabolic encephalopathy   Heart failure with mildly reduced ejection fraction (HCC)   Paroxysmal atrial fibrillation (HCC)   History of CAD (coronary artery disease)   Chronic hypoxic respiratory failure (HCC)   History of COPD   Fall at home, initial encounter    Assessment and Plan: Generalized weakness Mechanical fall -Patient presented emergency department complaining of mechanical fall.  EMS reported that during initial evaluation patient was alert oriented x 2 and O2 sat was  88% without any oxygen. - At presentation to ED patient found hypertensive otherwise hemodynamically stable - CBC, CMP, lactic acid within normal range. - CT head, CT cervical spine, chest x-ray and x-ray pelvis no evidence of fracture. - ED physician reported patient is too weak to ambulate and not safe to discharge to home. - Admitting inpatient for observation in the setting of generalized weakness.  Consulted inpatient PT and OT for evaluation of balance and requirement of DME.  Patient might team short-term skilled nursing facility placement versus home PT OT therapy. -Ambulate with assistance.   Acute metabolic encephalopathy/confusion in the setting of fall -Family reported at baseline patient has some confusion he is not able to tell exact date and time as he has been retired for a long time.  However per family patient looking more confused as compared to his baseline. -Concern for acute metabolic encephalopathy in the setting of fall and intermittent hypoxia. -CBC, CMP, lactic acid and blood alcohol level within normal range.  CT head CT cervical spine unremarkable. - Checking VBG, ammonia, TSH. -Continue to monitor mentation.  Chronic hypoxic respiratory failure on 2 L oxygen at baseline History of COPD - Continue supplemental oxygen 2 L.  At home patient is not any longer short acting LABA and LAMA treatment.  Wife at the bedside reported patient does not have any rescue inhaler at home. - Starting DuoNeb as needed for any wheezing  shortness of breath. -During discharge patient will need prescription of DuoNeb to use as needed.  HFrEF 40 to 45% Elevated blood pressure Essential hypertension -Elevated blood pressure 189/93. - Resuming lisinopril and Coreg .  Continue hydralazine  as needed.  Hyperlipidemia -Continue Crestor   Paroxysmal afibrillation -Continue Coreg  and amiodarone  - Continue Eliquis  5 mg twice daily.  History of CAD - Continue Coreg  and Crestor .   DVT  prophylaxis:  Eliquis  Code Status:  Full Code Diet: Heart healthy diet Family Communication:   Family was present at bedside, at the time of interview. Opportunity was given to ask question and all questions were answered satisfactorily.  Disposition Plan: Pending and PT and OT evaluation.  Possible SNF placement versus home health arrangement for home PT/OT therapy. Consults: PT and OT Admission status:   Observation, Telemetry bed  Severity of Illness: The appropriate patient status for this patient is OBSERVATION. Observation status is judged to be reasonable and necessary in order to provide the required intensity of service to ensure the patient's safety. The patient's presenting symptoms, physical exam findings, and initial radiographic and laboratory data in the context of their medical condition is felt to place them at decreased risk for further clinical deterioration. Furthermore, it is anticipated that the patient will be medically stable for discharge from the hospital within 2 midnights of admission.     Mertis Mosher, MD [facility]  How to contact the [facility] Attending or Consulting provider 7A - 7P or covering provider during after hours 7P -7A, for this patient.  Check the care team in [facility] and look for a) attending/consulting [facility] provider listed and b) the TR[facility] team listed Log into [email or web address] and use [facility]'s universal password to access. If you do not have the password, please contact the hospital operator. Locate the TR[facility] provider you are looking for under [facility] and page to a number that you can be directly reached. If you still have difficulty reaching the provider, please page the [facility] (Director on Call) for the Hospitalists listed on amion for assistance.  11/04/2023, 11:36 PM

## 2023-11-04 NOTE — ED Triage Notes (Signed)
 BIB [facility] from home as level 2 trauma s/p fall on blood thinners. Takes plavix. Was walking outside across cement and tripped. Fell onto buttocks. Denies ain. No obvious injuries or deformities. Denies complaints. Wears O2 at night. SPO2 on RA was 88%. Placed on [place] 2L by FD. Up to 96%. Dropped again to 88% when RA trialed again. Returned to 2L [place] 96%. Family reports usually A&Ox4. Currently A&Ox2. GCS 14 for confusion. C-collar in place. Wearing glasses. ETCO2 31. VSS. 18g NSL L AC. Arrives to full trauma team, alert, NAD, calm, interactive, resps e/u, speaking clearly. Skin W&D. Follows command, answers questions appropriately.

## 2023-11-04 NOTE — ED Provider Notes (Signed)
 [place] EMERGENCY DEPARTMENT AT [facility] Provider Note   CSN: [id number] Arrival date & time: 11/04/23  1830     History  Chief Complaint  Patient presents with   Trauma    Julian Brown is a 89 y.o. male.  Patient is an 89 year old male with PMH a fib on Eliquis , CHF, COPD on 2L O2 HS, prior supraglottic cancer with hoarse voice, HTN, HLD that presented to the emergency department as a fall.  Per EMS, the patient was walking in the neighborhood with his wife and he tripped and fell and landed on his bottom.  He denied hitting his head or losing consciousness.  EMS reports on their arrival he was ANO x 2 and per family his baseline is ANO x 4.  They also note that he was hypoxic to 88% on room air and placed him on 2 L nasal cannula.  Patient states that he had no lightheadedness or dizziness prior to the fall, denies any chest pain or shortness of breath or other recent illnesses.  He states that he does wear oxygen at night but did not know the reason why.  The history is provided by the patient and the EMS personnel. The history is limited by the condition of the patient.       Home Medications Prior to Admission medications   Medication Sig Start Date End Date Taking? Authorizing Provider  amiodarone  (PACERONE ) 200 MG tablet Take 0.5 tablets (100 mg total) by mouth daily. 10/19/23   Fenton, Clint R, PA  apixaban  (ELIQUIS ) 5 MG TABS tablet Take 1 tablet (5 mg total) by mouth 2 (two) times daily. 06/23/23   West, Katlyn D, NP  carvedilol  (COREG ) 3.125 MG tablet Take 1 tablet (3.125 mg total) by mouth 2 (two) times daily with a meal. 06/23/23   West, Katlyn D, NP  losartan  (COZAAR ) 25 MG tablet Take 0.5 tablets (12.5 mg total) by mouth daily. 06/23/23   West, Katlyn D, NP  Probiotic Product (PROBIOTIC PO) Take 1 capsule by mouth daily.    [provider]  rosuvastatin  (CRESTOR ) 10 MG tablet Take 1 tablet (10 mg total) by mouth daily. 06/23/23   West, Katlyn D, NP   Tiotropium Bromide  Monohydrate (SPIRIVA  RESPIMAT) 1.25 MCG/ACT AERS Inhale 2 puffs into the lungs daily. 05/31/22   Maire Scot, MD  VITAMIN D  PO Take 1 capsule by mouth daily.    [provider]      Allergies    Colesevelam, Fenofibrate, Rosuvastatin , Simvastatin, Amoxicillin-pot clavulanate, and Penicillins    Review of Systems   Review of Systems  Physical Exam Updated Vital Signs BP (!) 139/102   Pulse 80   Temp 98.2 F (36.8 C) (Oral)   Resp (!) 22   Ht 5\' 11"  (1.803 m)   Wt 69.4 kg   SpO2 94%   BMI 21.34 kg/m  Physical Exam Vitals and nursing note reviewed.  Constitutional:      General: He is not in acute distress.    Appearance: Normal appearance.  HENT:     Head: Normocephalic and atraumatic.     Nose: Nose normal.     Mouth/Throat:     Mouth: Mucous membranes are moist.     Pharynx: Oropharynx is clear.  Eyes:     Extraocular Movements: Extraocular movements intact.     Conjunctiva/sclera: Conjunctivae normal.     Pupils: Pupils are equal, round, and reactive to light.  Neck:  Comments: No midline neck tenderness, c-collar in place Cardiovascular:     Rate and Rhythm: Normal rate and regular rhythm.     Heart sounds: Normal heart sounds.  Pulmonary:     Effort: Pulmonary effort is normal.     Breath sounds: Normal breath sounds.  Abdominal:     General: Abdomen is flat.     Palpations: Abdomen is soft.     Tenderness: There is no abdominal tenderness.  Musculoskeletal:        General: Normal range of motion.     Comments: No midline back tenderness Pelvis stable, non-tender No bony tenderness to bilateral UE or LE  Skin:    General: Skin is warm and dry.     Findings: No bruising.  Neurological:     General: No focal deficit present.     Mental Status: He is alert.     Sensory: No sensory deficit.     Motor: No weakness.     Comments: Oriented to person and place only  Psychiatric:        Mood and Affect: Mood normal.         Behavior: Behavior normal.     ED Results / Procedures / Treatments   Labs (all labs ordered are listed, but only abnormal results are displayed) Labs Reviewed  COMPREHENSIVE METABOLIC PANEL WITH GFR - Abnormal; Notable for the following components:      Result Value   Glucose, Bld 117 (*)    All other components within normal limits  CBC - Abnormal; Notable for the following components:   MCV 101.4 (*)    All other components within normal limits  URINALYSIS, ROUTINE W REFLEX MICROSCOPIC - Abnormal; Notable for the following components:   Hgb urine dipstick SMALL (*)    Protein, ur 30 (*)    Bacteria, UA RARE (*)    All other components within normal limits  PROTIME-INR - Abnormal; Notable for the following components:   Prothrombin Time 19.3 (*)    INR 1.6 (*)    All other components within normal limits  I-STAT CHEM 8, ED - Abnormal; Notable for the following components:   BUN 24 (*)    Glucose, Bld 113 (*)    All other components within normal limits  ETHANOL  I-STAT CG4 LACTIC ACID, ED  SAMPLE TO BLOOD BANK    EKG EKG Interpretation Date/Time:  Saturday Nov 04 2023 18:49:01 EDT Ventricular Rate:  79 PR Interval:  185 QRS Duration:  98 QT Interval:  445 QTC Calculation: 449 R Axis:   20  Text Interpretation: Sinus rhythm with frequent PACs No significant change since last tracing Confirmed by Celesta Coke ([id number]) on 11/04/2023 7:16:26 PM  Radiology CT Head Wo Contrast Result Date: 11/04/2023 CLINICAL DATA:  Head and neck trauma EXAM: CT HEAD WITHOUT CONTRAST CT CERVICAL SPINE WITHOUT CONTRAST TECHNIQUE: Multidetector CT imaging of the head and cervical spine was performed following the standard protocol without intravenous contrast. Multiplanar CT image reconstructions of the cervical spine were also generated. RADIATION DOSE REDUCTION: This exam was performed according to the departmental dose-optimization program which includes automated exposure control,  adjustment of the mA and/or kV according to patient size and/or use of iterative reconstruction technique. COMPARISON:  None Available. FINDINGS: CT HEAD FINDINGS Brain: No intracranial hemorrhage, mass effect, or evidence of acute infarct. No hydrocephalus. No extra-axial fluid collection. Age related cerebral atrophy and chronic small vessel ischemic disease. Chronic right basal ganglia infarct. Vascular: No hyperdense vessel.  Intracranial arterial calcification. Skull: No fracture or focal lesion. Sinuses/Orbits: No acute finding. Other: None. CT CERVICAL SPINE FINDINGS Alignment: No evidence of traumatic malalignment. Skull base and vertebrae: No acute fracture. No primary bone lesion or focal pathologic process. Soft tissues and spinal canal: No prevertebral fluid or swelling. No visible canal hematoma. Disc levels: Multilevel age-related spondylosis and facet arthropathy. No severe spinal canal narrowing. Upper chest: Emphysema. Bilateral thyroid  nodules previously evaluated with ultrasound and biopsied in 2018. Other: Carotid calcification. IMPRESSION: 1. No acute intracranial abnormality. 2. No acute fracture in the cervical spine. Electronically Signed   By: Rozell Cornet M.D.   On: 11/04/2023 19:34   CT Cervical Spine Wo Contrast Result Date: 11/04/2023 CLINICAL DATA:  Head and neck trauma EXAM: CT HEAD WITHOUT CONTRAST CT CERVICAL SPINE WITHOUT CONTRAST TECHNIQUE: Multidetector CT imaging of the head and cervical spine was performed following the standard protocol without intravenous contrast. Multiplanar CT image reconstructions of the cervical spine were also generated. RADIATION DOSE REDUCTION: This exam was performed according to the departmental dose-optimization program which includes automated exposure control, adjustment of the mA and/or kV according to patient size and/or use of iterative reconstruction technique. COMPARISON:  None Available. FINDINGS: CT HEAD FINDINGS Brain: No intracranial  hemorrhage, mass effect, or evidence of acute infarct. No hydrocephalus. No extra-axial fluid collection. Age related cerebral atrophy and chronic small vessel ischemic disease. Chronic right basal ganglia infarct. Vascular: No hyperdense vessel. Intracranial arterial calcification. Skull: No fracture or focal lesion. Sinuses/Orbits: No acute finding. Other: None. CT CERVICAL SPINE FINDINGS Alignment: No evidence of traumatic malalignment. Skull base and vertebrae: No acute fracture. No primary bone lesion or focal pathologic process. Soft tissues and spinal canal: No prevertebral fluid or swelling. No visible canal hematoma. Disc levels: Multilevel age-related spondylosis and facet arthropathy. No severe spinal canal narrowing. Upper chest: Emphysema. Bilateral thyroid  nodules previously evaluated with ultrasound and biopsied in 2018. Other: Carotid calcification. IMPRESSION: 1. No acute intracranial abnormality. 2. No acute fracture in the cervical spine. Electronically Signed   By: Rozell Cornet M.D.   On: 11/04/2023 19:34   DG Pelvis Portable Result Date: 11/04/2023 CLINICAL DATA:  Trauma EXAM: PORTABLE PELVIS 1-2 VIEWS COMPARISON:  PET CT 01/13/2020 FINDINGS: Limited evaluation due to overlapping osseous structures and overlying soft tissues. There is no evidence of pelvic fracture or diastasis. No acute displaced fracture or dislocation of the hips. No pelvic bone lesions are seen. Degenerative changes lower lumbar spine. Atherosclerotic plaque. IMPRESSION: Negative for acute traumatic injury. Limited evaluation due to overlapping osseous structures and overlying soft tissues. Electronically Signed   By: Morgane  Naveau M.D.   On: 11/04/2023 19:12   DG Chest Port 1 View Result Date: 11/04/2023 CLINICAL DATA:  Trauma EXAM: PORTABLE CHEST 1 VIEW COMPARISON:  Chest x-ray 06/09/2023 FINDINGS: The heart and mediastinal contours are unchanged. Atherosclerotic plaque. No focal consolidation. Chronic coarsened  interstitial markings with no overt pulmonary edema. No pleural effusion. No pneumothorax. No acute osseous abnormality. IMPRESSION: 1. No active disease. 2. Aortic Atherosclerosis (ICD10-I70.0) and Emphysema (ICD10-J43.9). Electronically Signed   By: Morgane  Naveau M.D.   On: 11/04/2023 19:11    Procedures Procedures    Medications Ordered in ED Medications  lidocaine  (LIDODERM ) 5 % 1-3 patch (3 patches Transdermal Patch Applied 11/04/23 2035)  acetaminophen  (TYLENOL ) tablet 650 mg (650 mg Oral Given 11/04/23 2033)    ED Course/ Medical Decision Making/ A&P Clinical Course as of 11/04/23 2252  Sat Nov 04, 2023  2000 No  acute traumatic injury on imaging, labs within normal range. UA is pending. [VK]  2048 Family at bedside state that it is normal for him not to know day/time since he is retired and does not look at the calendar. States he is at his neurologic baseline. Will assess UA and have ambulatory trial with likely plan for discharge home. [VK]  2138 Patient had significant weakness after attempting to ambulate, needed moderate assistance. Will be recommended admission in the setting of his weakness and confusion. [VK]    Clinical Course User Index [VK] Kingsley, Denarius Sesler K, DO                                 Medical Decision Making This patient presents to the ED with chief complaint(s) of fall, AMS with pertinent past medical history of a fib on Eliquis , CHF, COPD on 2L O2 HS, prior supraglottic cancer with hoarse voice, HTN, HLD which further complicates the presenting complaint. The complaint involves an extensive differential diagnosis and also carries with it a high risk of complications and morbidity.    The differential diagnosis includes due to patient's fall on thinners with altered mental status concern for ICH, mass effect, cervical spine fracture, with hypoxia concern for pneumonia, pneumothorax, pulmonary edema, pleural effusion though does have COPD so may be baseline  O2 saturation for him, no wheezing on exam making COPD exacerbation unlikely, considering pelvis or sacral fracture with him complaining of buttock pain though no reproducible tenderness on exam, no other traumatic injury seen on exam  Additional history obtained: Additional history obtained from EMS  Records reviewed outpatient cardiology and pulmonology records  ED Course and Reassessment: Patient was made a prehospital arrival level 2 trauma due to his fall on thinners with change in mental status.  On his arrival primary survey was intact.  Secondary survey was significant for GCS of 14 with confusion due to date.  No other traumatic injuries seen on exam.  The patient will have CT head, C-spine, chest x-ray and pelvis x-ray performed as well as labs including urine to evaluate for cause of his altered mental status.  Will plan to obtain additional history from family of his baseline mental status on their arrival.  Independent labs interpretation:  The following labs were independently interpreted: within normal range  Independent visualization of imaging: - I independently visualized the following imaging with scope of interpretation limited to determining acute life threatening conditions related to emergency care: CTH/C-spine, CXR, pelvis XR, which revealed no acute traumatic injury  Consultation: - Consulted or discussed management/test interpretation w/ external professional: hospitalist  Consideration for admission or further workup: patient requires admission for his weakness and confusion Social Determinants of health: N/A    Amount and/or Complexity of Data Reviewed Labs: ordered. Radiology: ordered.  Risk OTC drugs. Prescription drug management. Decision regarding hospitalization.          Final Clinical Impression(s) / ED Diagnoses Final diagnoses:  Fall, initial encounter  Weakness  Confusion    Rx / DC Orders ED Discharge Orders     None          Kingsley, Jazzmon Prindle K, DO 11/04/23 2252

## 2023-11-04 NOTE — ED Notes (Signed)
 Patient ambulated in hallway. Patient states that he feels weak while walking. RN having to assist a good amount to provide stability.

## 2023-11-05 ENCOUNTER — Observation Stay (HOSPITAL_COMMUNITY)

## 2023-11-05 DIAGNOSIS — J9611 Chronic respiratory failure with hypoxia: Secondary | ICD-10-CM | POA: Diagnosis not present

## 2023-11-05 DIAGNOSIS — R5381 Other malaise: Secondary | ICD-10-CM | POA: Diagnosis not present

## 2023-11-05 DIAGNOSIS — G9341 Metabolic encephalopathy: Secondary | ICD-10-CM | POA: Diagnosis not present

## 2023-11-05 LAB — BLOOD GAS, VENOUS
Acid-Base Excess: 3.7 mmol/L — ABNORMAL HIGH (ref 0.0–2.0)
Bicarbonate: 29.7 mmol/L — ABNORMAL HIGH (ref 20.0–28.0)
O2 Saturation: 75.6 %
Patient temperature: 36.6
pCO2, Ven: 48 mmHg (ref 44–60)
pH, Ven: 7.4 (ref 7.25–7.43)
pO2, Ven: 42 mmHg (ref 32–45)

## 2023-11-05 LAB — DIFFERENTIAL
Abs Immature Granulocytes: 0.06 10*3/uL (ref 0.00–0.07)
Basophils Absolute: 0.1 10*3/uL (ref 0.0–0.1)
Basophils Relative: 0 %
Eosinophils Absolute: 0.1 10*3/uL (ref 0.0–0.5)
Eosinophils Relative: 1 %
Immature Granulocytes: 1 %
Lymphocytes Relative: 6 %
Lymphs Abs: 0.8 10*3/uL (ref 0.7–4.0)
Monocytes Absolute: 1 10*3/uL (ref 0.1–1.0)
Monocytes Relative: 8 %
Neutro Abs: 11.1 10*3/uL — ABNORMAL HIGH (ref 1.7–7.7)
Neutrophils Relative %: 84 %

## 2023-11-05 LAB — CBC
HCT: 41.7 % (ref 39.0–52.0)
HCT: 41.4 % (ref 39.0–52.0)
Hemoglobin: 13.7 g/dL (ref 13.0–17.0)
Hemoglobin: 13.9 g/dL (ref 13.0–17.0)
MCH: 33.5 pg (ref 26.0–34.0)
MCH: 34.4 pg — ABNORMAL HIGH (ref 26.0–34.0)
MCHC: 32.9 g/dL (ref 30.0–36.0)
MCHC: 33.6 g/dL (ref 30.0–36.0)
MCV: 102 fL — ABNORMAL HIGH (ref 80.0–100.0)
MCV: 102.5 fL — ABNORMAL HIGH (ref 80.0–100.0)
Platelets: 146 10*3/uL — ABNORMAL LOW (ref 150–400)
Platelets: 150 10*3/uL (ref 150–400)
RBC: 4.09 MIL/uL — ABNORMAL LOW (ref 4.22–5.81)
RBC: 4.04 MIL/uL — ABNORMAL LOW (ref 4.22–5.81)
RDW: 13.3 % (ref 11.5–15.5)
RDW: 13.3 % (ref 11.5–15.5)
WBC: 13.1 10*3/uL — ABNORMAL HIGH (ref 4.0–10.5)
WBC: 13.1 10*3/uL — ABNORMAL HIGH (ref 4.0–10.5)
nRBC: 0 % (ref 0.0–0.2)
nRBC: 0 % (ref 0.0–0.2)

## 2023-11-05 LAB — COMPREHENSIVE METABOLIC PANEL WITH GFR
ALT: 18 U/L (ref 0–44)
AST: 21 U/L (ref 15–41)
Albumin: 3.1 g/dL — ABNORMAL LOW (ref 3.5–5.0)
Alkaline Phosphatase: 41 U/L (ref 38–126)
Anion gap: 9 (ref 5–15)
BUN: 21 mg/dL (ref 8–23)
CO2: 26 mmol/L (ref 22–32)
Calcium: 9.2 mg/dL (ref 8.9–10.3)
Chloride: 100 mmol/L (ref 98–111)
Creatinine, Ser: 1.19 mg/dL (ref 0.61–1.24)
GFR, Estimated: 59 mL/min — ABNORMAL LOW (ref >60)
Glucose, Bld: 132 mg/dL — ABNORMAL HIGH (ref 70–99)
Potassium: 4.1 mmol/L (ref 3.5–5.1)
Sodium: 135 mmol/L (ref 135–145)
Total Bilirubin: 1.2 mg/dL (ref 0.0–1.2)
Total Protein: 5.6 g/dL — ABNORMAL LOW (ref 6.5–8.1)

## 2023-11-05 LAB — TSH: TSH: 1.632 u[IU]/mL (ref 0.350–4.500)

## 2023-11-05 MED ORDER — LACTATED RINGERS IV BOLUS
500.0000 mL | INTRAVENOUS | Status: AC
  Administered 2023-11-05: 500 mL via INTRAVENOUS

## 2023-11-05 MED ORDER — ACETAMINOPHEN 160 MG/5ML PO SOLN
650.0000 mg | Freq: Four times a day (QID) | ORAL | Status: DC | PRN
  Administered 2023-11-05: 650 mg via ORAL
  Filled 2023-11-05: qty 20.3

## 2023-11-05 NOTE — Care Management (Addendum)
 Transition of Care [facility]) - Inpatient Brief Assessment   Patient Details  Name: Julian Brown MRN: [id number] Date of Birth: 23-Apr-1933  Transition of Care [facility]) CM/SW Contact:    Ronni Colace, RN Phone Number: 11/05/2023, 9:37 AM   Clinical Narrative:  89 year old lives at home with his spouse. He presented with a fall and with some hypoxia, normally on oxygen at 2L at night. He is currently on 2L desaturated on room air with ambulation. Will need formal oxygen qualifications prior to DC. Spoke with the patient  and daughter at bedside. He has had home health before, discharged on 3.25, they would like the same agency, cannot remember the name off hand. PING consulted, [facility] is the agency.PT assessment reveals recommendation for Home health.  Called [facility] and she accepted for PT OT. Will message provider for face to face orders. The patient has a walker and 3:1 at home, denies need for any further DME at this time.  TOC will follow 1000 patient has nocturnal oxygen from [facility], walking test done, needs continuously. Patients oxygen is through [facility]. Called [facility] to let them know. Orders in for [facility] and oxygen.  1150 patient will be discharged, [facility] aware to call Daughter to schedule services. [employer] bringing oxygen tank for discharge  Transition of Care Asessment: Insurance and Status: Insurance coverage has been reviewed Patient has primary care physician: Yes Home environment has been reviewed: Live with spouse Prior level of function:: Uses walker Prior/Current Home Services: No current home services Social Drivers of Health Review: SDOH reviewed no interventions necessary Readmission risk has been reviewed: Yes Transition of care needs: transition of care needs identified, TOC will continue to follow

## 2023-11-05 NOTE — Progress Notes (Signed)
 SATURATION QUALIFICATIONS: (This note is used to comply with regulatory documentation for home oxygen)  Patient Saturations on Room Air at Rest = 84%  Patient Saturations on Room Air while Ambulating = Not tested due to desaturation at rest  Patient Saturations on 2 Liters of oxygen while Ambulating = 87%  Please briefly explain why patient needs home oxygen:  To maintain oxygen saturation during functional mobility.    Gayle Kava, PT Acute Rehabilitation Services  Office [phone]

## 2023-11-05 NOTE — Assessment & Plan Note (Addendum)
-   Noted to have some confusion on admission and family reports typically none at baseline - CT head unremarkable - MRI brain also negative - TSH normal.  After evaluation, not felt to need ammonia check and mentation was back to baseline per daughter -UA negative for signs of infection, does have some hyaline casts, possibly dehydration although does have mildly reduced EF.  No AKI noted.  CXR also unremarkable - Other considered differential would be mild dementia due to underlying cerebral atrophy and history of chronic right BG infarct

## 2023-11-05 NOTE — Hospital Course (Addendum)
 Julian Brown is an 89 year old male with PMH HFrEF, COPD, chronic nocturnal oxygen requirement, 2 L, HLD, CAD, history of prostate cancer, history of supraglottic cancer with chronic dysphagia, chronic hoarseness who presented with weakness and fall. Patient typically fully oriented but was noticed to be mildly confused and only oriented x 2 with mild hypoxia 88% on room air and placed on 2 L oxygen. He was admitted for further weakness and confusion workup. Workup was overall negative.  No obvious signs of infection and mentation returned to baseline while monitoring in the ER.  This was confirmed by his daughter present bedside. CT head did show old right basal ganglia infarct that family was unaware of.  Due to subtle right sided weakness, he was also sent for MRI brain for completeness sake.  This was negative for acute findings specifically stroke. He was evaluated by PT/OT and recommended for home health at discharge which was also arranged.

## 2023-11-05 NOTE — Progress Notes (Signed)
 Transient hypotension Patient is RN reported that blood pressure has been trended down and soft 87/58.  Holding losartan  and Coreg .  Giving 500 mL of NS bolus.

## 2023-11-05 NOTE — Progress Notes (Signed)
 OT Cancellation Note  Patient Details Name: Julian Brown MRN: [id number] DOB: Dec 21, 1932   Cancelled Treatment:    Reason Eval/Treat Not Completed: Other (comment). Discussed pt with PT who recommends DC home with family. Per discussion, pt has assistance with ADL tasks from family and has necessary DME. Recommend follow up with HHOT. Will defer OT to [facility]. Please send message if eval needed. Thank you.   Cindy Fullman,HILLARY 11/05/2023, 8:47 AM Milburn Aliment, OT/L   Acute OT Clinical Specialist Acute Rehabilitation Services Pager [phone] Office [phone]

## 2023-11-05 NOTE — Assessment & Plan Note (Addendum)
-   On nocturnal oxygen at home, mildly hypoxic on admission, 88% on room air - Wean oxygen as able - Oxygen continued at discharge

## 2023-11-05 NOTE — Discharge Summary (Signed)
 Physician Discharge Summary   Julian Brown ZOX:[id number] DOB: [birth year] DOA: 11/04/2023  PCP: Avva, Ravisankar, MD  Admit date: 11/04/2023 Discharge date: 11/05/2023  Admitted From: Home Disposition:  Home Discharging physician: Faith Homes, MD Barriers to discharge: none   Home Health: PT, OT  Discharge Condition: stable CODE STATUS: Full  Diet recommendation:  Diet Orders (From admission, onward)     Start     Ordered   11/05/23 0000  Diet - low sodium heart healthy        11/05/23 1155   11/04/23 2320  Diet Heart Room service appropriate? Yes; Fluid consistency: Thin  Diet effective now       Question Answer Comment  Room service appropriate? Yes   Fluid consistency: Thin      11/04/23 2319            Hospital Course: Julian Brown is an 89 year old male with PMH HFrEF, COPD, chronic nocturnal oxygen requirement, 2 L, HLD, CAD, history of prostate cancer, history of supraglottic cancer with chronic dysphagia, chronic hoarseness who presented with weakness and fall. Patient typically fully oriented but was noticed to be mildly confused and only oriented x 2 with mild hypoxia 88% on room air and placed on 2 L oxygen. He was admitted for further weakness and confusion workup. Workup was overall negative.  No obvious signs of infection and mentation returned to baseline while monitoring in the ER.  This was confirmed by his daughter present bedside. CT head did show old right basal ganglia infarct that family was unaware of.  Due to subtle right sided weakness, he was also sent for MRI brain for completeness sake.  This was negative for acute findings specifically stroke. He was evaluated by PT/OT and recommended for home health at discharge which was also arranged.  Assessment and Plan: * Physical deconditioning - Possibly age-related with generalized deconditioning versus reversible cause (infectious workup ongoing) - PT/OT consulted on admission; home health  ordered at discharge  Acute metabolic encephalopathy-resolved as of 11/05/2023 - Noted to have some confusion on admission and family reports typically none at baseline - CT head unremarkable - MRI brain also negative - TSH normal.  After evaluation, not felt to need ammonia check and mentation was back to baseline per daughter -UA negative for signs of infection, does have some hyaline casts, possibly dehydration although does have mildly reduced EF.  No AKI noted.  CXR also unremarkable - Other considered differential would be mild dementia due to underlying cerebral atrophy and history of chronic right BG infarct  Chronic hypoxic respiratory failure (HCC) - On nocturnal oxygen at home, mildly hypoxic on admission, 88% on room air - Wean oxygen as able - Oxygen continued at discharge  Chronic systolic CHF (congestive heart failure) (HCC) - no s/s exac - Recent right/left heart cath and echo in December 2024: EF 40 to 45%, global hypokinesis, indeterminate diastology  Paroxysmal atrial fibrillation (HCC) - Continue Eliquis  and Coreg    The patient's acute and chronic medical conditions were treated accordingly. On day of discharge, patient was felt deemed stable for discharge. Patient/family member advised to call PCP or come back to ER if needed.   Principal Diagnosis: Physical deconditioning  Discharge Diagnoses: Active Hospital Problems   Diagnosis Date Noted   Physical deconditioning 11/04/2023    Priority: 3.   Chronic hypoxic respiratory failure (HCC) 11/04/2023    Priority: 2.   Chronic systolic CHF (congestive heart failure) (HCC) 11/04/2023    Priority: 4.  History of CAD (coronary artery disease) 11/04/2023   History of COPD 11/04/2023   Paroxysmal atrial fibrillation (HCC) 06/13/2023    Resolved Hospital Problems   Diagnosis Date Noted Date Resolved   Acute metabolic encephalopathy 11/04/2023 11/05/2023    Priority: 1.     Discharge Instructions     Diet -  low sodium heart healthy   Complete by: As directed    Increase activity slowly   Complete by: As directed       Allergies as of 11/05/2023       Reactions   Colesevelam Other (See Comments)   increases TG, constipation   Fenofibrate Other (See Comments)    muscle aches   Rosuvastatin  Other (See Comments)   Myalgias   Simvastatin Other (See Comments)   Myalgias   Amoxicillin-pot Clavulanate Rash   Penicillins Swelling        Medication List     TAKE these medications    amiodarone  200 MG tablet Commonly known as: PACERONE  Take 0.5 tablets (100 mg total) by mouth daily. What changed: when to take this   apixaban  5 MG Tabs tablet Commonly known as: ELIQUIS  Take 1 tablet (5 mg total) by mouth 2 (two) times daily.   carvedilol  3.125 MG tablet Commonly known as: COREG  Take 1 tablet (3.125 mg total) by mouth 2 (two) times daily with a meal.   losartan  25 MG tablet Commonly known as: COZAAR  Take 0.5 tablets (12.5 mg total) by mouth daily.   PROBIOTIC PO Take 1 capsule by mouth daily.   rosuvastatin  10 MG tablet Commonly known as: CRESTOR  Take 1 tablet (10 mg total) by mouth daily.   VITAMIN D  PO Take 1 capsule by mouth daily.               Durable Medical Equipment  (From admission, onward)           Start     Ordered   11/05/23 1000  For home use only DME oxygen  Once       Question Answer Comment  Length of Need Lifetime   Mode or (Route) Nasal cannula   Liters per Minute 2   Frequency Continuous (stationary and portable oxygen unit needed)   Oxygen conserving device Yes   Oxygen delivery system Gas      11/05/23 0959            Follow-up Information     [facility] Follow up.   Why: Home health company they will call you to set up services [facility] is contact [phone] Contact information: [address] [place] [address] [phone]         [facility] Follow up.   Why:  oxygen servicing company Contact information: [address] [place] [address] [phone]                Allergies  Allergen Reactions   Colesevelam Other (See Comments)    increases TG, constipation   Fenofibrate Other (See Comments)     muscle aches   Rosuvastatin  Other (See Comments)    Myalgias   Simvastatin Other (See Comments)    Myalgias   Amoxicillin-Pot Clavulanate Rash   Penicillins Swelling    Consultations:   Procedures:   Discharge Exam: BP 122/88   Pulse (!) 51   Temp 98.2 F (36.8 C) (Oral)   Resp 16   Ht 5\' 11"  (1.803 m)   Wt 69.4 kg  SpO2 97%   BMI 21.34 kg/m  Physical Exam Constitutional:      General: He is not in acute distress.    Appearance: Normal appearance.     Comments: Hoarse voice chronic and baseline  HENT:     Head: Normocephalic and atraumatic.     Mouth/Throat:     Mouth: Mucous membranes are moist.  Eyes:     Extraocular Movements: Extraocular movements intact.  Cardiovascular:     Rate and Rhythm: Normal rate and regular rhythm.  Pulmonary:     Effort: Pulmonary effort is normal. No respiratory distress.     Breath sounds: Normal breath sounds. No wheezing.  Abdominal:     General: Bowel sounds are normal. There is no distension.     Palpations: Abdomen is soft.     Tenderness: There is no abdominal tenderness.  Musculoskeletal:        General: Normal range of motion.     Cervical back: Normal range of motion and neck supple.  Skin:    General: Skin is warm and dry.  Neurological:     Mental Status: He is alert.     Comments: Subtle RUE/RLE weakness  Psychiatric:        Mood and Affect: Mood normal.        Behavior: Behavior normal.      The results of significant diagnostics from this hospitalization (including imaging, microbiology, ancillary and laboratory) are listed below for reference.   Microbiology: No results found for this or any previous visit (from the past 240 hours).    Labs: BNP (last 3 results) Recent Labs    06/09/23 1224  BNP 590.0*   Basic Metabolic Panel: Recent Labs  Lab 11/04/23 1842 11/04/23 1851 11/05/23 0357  NA 137 138 135  K 4.6 4.5 4.1  CL 103 100 100  CO2 26  --  26  GLUCOSE 117* 113* 132*  BUN 21 24* 21  CREATININE 1.10 1.20 1.19  CALCIUM  9.5  --  9.2   Liver Function Tests: Recent Labs  Lab 11/04/23 1842 11/05/23 0357  AST 22 21  ALT 21 18  ALKPHOS 48 41  BILITOT 0.7 1.2  PROT 6.6 5.6*  ALBUMIN 3.7 3.1*   No results for input(s): "LIPASE", "AMYLASE" in the last 168 hours. No results for input(s): "AMMONIA" in the last 168 hours. CBC: Recent Labs  Lab 11/04/23 1842 11/04/23 1851 11/05/23 0357  WBC 6.2  --  13.1*  13.1*  NEUTROABS  --   --  11.1*  HGB 14.7 15.3 13.9  13.7  HCT 44.7 45.0 41.4  41.7  MCV 101.4*  --  102.5*  102.0*  PLT 167  --  150  146*   Cardiac Enzymes: No results for input(s): "CKTOTAL", "CKMB", "CKMBINDEX", "TROPONINI" in the last 168 hours. BNP: Invalid input(s): "POCBNP" CBG: No results for input(s): "GLUCAP" in the last 168 hours. D-Dimer No results for input(s): "DDIMER" in the last 72 hours. Hgb A1c No results for input(s): "HGBA1C" in the last 72 hours. Lipid Profile No results for input(s): "CHOL", "HDL", "LDLCALC", "TRIG", "CHOLHDL", "LDLDIRECT" in the last 72 hours. Thyroid  function studies Recent Labs    11/05/23 0357  TSH 1.632   Anemia work up No results for input(s): "VITAMINB12", "FOLATE", "FERRITIN", "TIBC", "IRON", "RETICCTPCT" in the last 72 hours. Urinalysis    Component Value Date/Time   COLORURINE YELLOW 11/04/2023 2134   APPEARANCEUR CLEAR 11/04/2023 2134   LABSPEC 1.008 11/04/2023 2134   PHURINE 7.0  11/04/2023 2134   GLUCOSEU NEGATIVE 11/04/2023 2134   HGBUR SMALL (A) 11/04/2023 2134   BILIRUBINUR NEGATIVE 11/04/2023 2134   KETONESUR NEGATIVE 11/04/2023 2134   PROTEINUR 30 (A) 11/04/2023 2134   UROBILINOGEN 1.0 11/27/2012 0044   NITRITE  NEGATIVE 11/04/2023 2134   LEUKOCYTESUR NEGATIVE 11/04/2023 2134   Sepsis Labs Recent Labs  Lab 11/04/23 1842 11/05/23 0357  WBC 6.2 13.1*  13.1*   Microbiology No results found for this or any previous visit (from the past 240 hours).  Procedures/Studies: MR BRAIN WO CONTRAST Result Date: 11/05/2023 CLINICAL DATA:  Right-sided weakness, history of CVA EXAM: MRI HEAD WITHOUT CONTRAST TECHNIQUE: Multiplanar, multiecho pulse sequences of the brain and surrounding structures were obtained without intravenous contrast. COMPARISON:  Head CT from yesterday FINDINGS: Brain: No acute infarction, hemorrhage, hydrocephalus, extra-axial collection or mass lesion. Moderate atrophy for age, generalized. Mild chronic small vessel ischemia with gliosis scattered in the cerebral white matter and pons. No abnormal mineralization Vascular: Normal flow voids. Skull and upper cervical spine: No focal marrow lesion. Sinuses/Orbits: No acute finding.  Bilateral cataract resection. Other: Intermittent motion artifact throughout the scan. IMPRESSION: No acute finding or specific cause for weakness. Atrophy and mild chronic small vessel ischemia. Electronically Signed   By: Ronnette Coke M.D.   On: 11/05/2023 11:27   CT Head Wo Contrast Result Date: 11/04/2023 CLINICAL DATA:  Head and neck trauma EXAM: CT HEAD WITHOUT CONTRAST CT CERVICAL SPINE WITHOUT CONTRAST TECHNIQUE: Multidetector CT imaging of the head and cervical spine was performed following the standard protocol without intravenous contrast. Multiplanar CT image reconstructions of the cervical spine were also generated. RADIATION DOSE REDUCTION: This exam was performed according to the departmental dose-optimization program which includes automated exposure control, adjustment of the mA and/or kV according to patient size and/or use of iterative reconstruction technique. COMPARISON:  None Available. FINDINGS: CT HEAD FINDINGS Brain: No intracranial hemorrhage,  mass effect, or evidence of acute infarct. No hydrocephalus. No extra-axial fluid collection. Age related cerebral atrophy and chronic small vessel ischemic disease. Chronic right basal ganglia infarct. Vascular: No hyperdense vessel. Intracranial arterial calcification. Skull: No fracture or focal lesion. Sinuses/Orbits: No acute finding. Other: None. CT CERVICAL SPINE FINDINGS Alignment: No evidence of traumatic malalignment. Skull base and vertebrae: No acute fracture. No primary bone lesion or focal pathologic process. Soft tissues and spinal canal: No prevertebral fluid or swelling. No visible canal hematoma. Disc levels: Multilevel age-related spondylosis and facet arthropathy. No severe spinal canal narrowing. Upper chest: Emphysema. Bilateral thyroid  nodules previously evaluated with ultrasound and biopsied in 2018. Other: Carotid calcification. IMPRESSION: 1. No acute intracranial abnormality. 2. No acute fracture in the cervical spine. Electronically Signed   By: Rozell Cornet M.D.   On: 11/04/2023 19:34   CT Cervical Spine Wo Contrast Result Date: 11/04/2023 CLINICAL DATA:  Head and neck trauma EXAM: CT HEAD WITHOUT CONTRAST CT CERVICAL SPINE WITHOUT CONTRAST TECHNIQUE: Multidetector CT imaging of the head and cervical spine was performed following the standard protocol without intravenous contrast. Multiplanar CT image reconstructions of the cervical spine were also generated. RADIATION DOSE REDUCTION: This exam was performed according to the departmental dose-optimization program which includes automated exposure control, adjustment of the mA and/or kV according to patient size and/or use of iterative reconstruction technique. COMPARISON:  None Available. FINDINGS: CT HEAD FINDINGS Brain: No intracranial hemorrhage, mass effect, or evidence of acute infarct. No hydrocephalus. No extra-axial fluid collection. Age related cerebral atrophy and chronic small vessel ischemic disease. Chronic right  basal  ganglia infarct. Vascular: No hyperdense vessel. Intracranial arterial calcification. Skull: No fracture or focal lesion. Sinuses/Orbits: No acute finding. Other: None. CT CERVICAL SPINE FINDINGS Alignment: No evidence of traumatic malalignment. Skull base and vertebrae: No acute fracture. No primary bone lesion or focal pathologic process. Soft tissues and spinal canal: No prevertebral fluid or swelling. No visible canal hematoma. Disc levels: Multilevel age-related spondylosis and facet arthropathy. No severe spinal canal narrowing. Upper chest: Emphysema. Bilateral thyroid  nodules previously evaluated with ultrasound and biopsied in 2018. Other: Carotid calcification. IMPRESSION: 1. No acute intracranial abnormality. 2. No acute fracture in the cervical spine. Electronically Signed   By: Rozell Cornet M.D.   On: 11/04/2023 19:34   DG Pelvis Portable Result Date: 11/04/2023 CLINICAL DATA:  Trauma EXAM: PORTABLE PELVIS 1-2 VIEWS COMPARISON:  PET CT 01/13/2020 FINDINGS: Limited evaluation due to overlapping osseous structures and overlying soft tissues. There is no evidence of pelvic fracture or diastasis. No acute displaced fracture or dislocation of the hips. No pelvic bone lesions are seen. Degenerative changes lower lumbar spine. Atherosclerotic plaque. IMPRESSION: Negative for acute traumatic injury. Limited evaluation due to overlapping osseous structures and overlying soft tissues. Electronically Signed   By: Morgane  Naveau M.D.   On: 11/04/2023 19:12   DG Chest Port 1 View Result Date: 11/04/2023 CLINICAL DATA:  Trauma EXAM: PORTABLE CHEST 1 VIEW COMPARISON:  Chest x-ray 06/09/2023 FINDINGS: The heart and mediastinal contours are unchanged. Atherosclerotic plaque. No focal consolidation. Chronic coarsened interstitial markings with no overt pulmonary edema. No pleural effusion. No pneumothorax. No acute osseous abnormality. IMPRESSION: 1. No active disease. 2. Aortic Atherosclerosis (ICD10-I70.0)  and Emphysema (ICD10-J43.9). Electronically Signed   By: Morgane  Naveau M.D.   On: 11/04/2023 19:11     Time coordinating discharge: Over 30 minutes    Faith Homes, MD  [facility] 11/05/2023, 1:30 PM

## 2023-11-05 NOTE — Care Management Obs Status (Signed)
 MEDICARE OBSERVATION STATUS NOTIFICATION   Patient Details  Name: Julian Brown MRN: [id number] Date of Birth: [birth year]   Medicare Observation Status Notification Given:  Yes   Daughter in room with patient, patient alert conversant appropriate Ronni Colace, RN 11/05/2023, 8:51 AM

## 2023-11-05 NOTE — Assessment & Plan Note (Addendum)
-   Possibly age-related with generalized deconditioning versus reversible cause (infectious workup ongoing) - PT/OT consulted on admission; home health ordered at discharge

## 2023-11-05 NOTE — Evaluation (Signed)
 Physical Therapy Evaluation Patient Details Name: Julian Brown MRN: [id number] DOB: [birth year] Today's Date: 11/05/2023  History of Present Illness  Pt is 89 year old presented to [facility] on [facility]/04/23 after a fall. Required 2L O2 due to hypoxia.  PMH - laryngeal CA, copd, prostate CA, colon CA, HTN, 2L O2 at night, afib on anticoagulation  Clinical Impression  Pt admitted secondary to problem above with deficits below. PTA patient lives in one level home with one step to enter. He lives with wife and has 24/7 assist. He requires min assist for transfers and walks with rollator inside home and holding a family member's hand when outside. He requires assist with bathing and dressing at baseline. Pt currently requires min assist for transfer with max cues for hand placement/safe use of RW. He walks with contact-guard assist with RW x 70 ft with no imbalance. He did however desaturate to 87% on 2L while walking (desaturated to 84% when attempted pt on room air at rest). Daughter present and agrees pt is close to, if not at, his baseline. With recent fall (the only one in past 6 months), feel HHPT is warranted and family would also like to have an aide to assist with his bathing and dressing.  Anticipate patient will benefit from PT to address problems listed below. Will continue to follow acutely to maximize functional mobility, independence, and safety.           If plan is discharge home, recommend the following: A little help with walking and/or transfers;A little help with bathing/dressing/bathroom;Direct supervision/assist for medications management;Direct supervision/assist for financial management;Supervision due to cognitive status   Can travel by private vehicle        Equipment Recommendations None recommended by PT  Recommendations for Other Services  Other (comment) [facility] aide)    Functional Status Assessment Patient has had a recent decline in their functional status and demonstrates the  ability to make significant improvements in function in a reasonable and predictable amount of time.     Precautions / Restrictions Precautions Precautions: Fall;Other (comment) Recall of Precautions/Restrictions: Impaired Precaution/Restrictions Comments: desaturates on RA and with walking      Mobility  Bed Mobility Overal bed mobility: Needs Assistance Bed Mobility: Supine to Sit, Sit to Supine     Supine to sit: Modified independent (Device/Increase time), HOB elevated Sit to supine: Min assist   General bed mobility comments: on ED stretcher; assist to raise legs onto bed    Transfers Overall transfer level: Needs assistance Equipment used: Rolling walker (2 wheels) Transfers: Sit to/from Stand Sit to Stand: Min assist           General transfer comment: daughter reports he needs min assist at home; repeated x 3 during session    Ambulation/Gait Ambulation/Gait assistance: Contact guard assist Gait Distance (Feet): 70 Feet Assistive device: Rolling walker (2 wheels) Gait Pattern/deviations: Step-through pattern, Decreased stride length, Narrow base of support, Trunk flexed   Gait velocity interpretation: <1.8 ft/sec, indicate of risk for recurrent falls   General Gait Details: daugther reports ambulation is near baseline, feels he is turning his rt foot out more than usual; required 2L O2  Stairs            Wheelchair Mobility     Tilt Bed    Modified Rankin (Stroke Patients Only)       Balance Overall balance assessment: History of Falls, Needs assistance   Sitting balance-Leahy Scale: Fair     Standing balance support:  Bilateral upper extremity supported, Reliant on assistive device for balance Standing balance-Leahy Scale: Poor                               Pertinent Vitals/Pain Pain Assessment Pain Assessment: No/denies pain    Home Living Family/patient expects to be discharged to:: Private residence Living  Arrangements: Spouse/significant other Available Help at Discharge: Family;Available 24 hours/day Type of Home: House Home Access: Stairs to enter Entrance Stairs-Rails: None Entrance Stairs-Number of Steps: 1   Home Layout: One level Home Equipment: Rollator (4 wheels);BSC/3in1 Additional Comments: uses oxygen when he sleeps    Prior Function Prior Level of Function : Independent/Modified Independent;Driving             Mobility Comments: uses rollator inside at all times; outside he holds onto family members; ADLs Comments: assist with bathing and dressing     Extremity/Trunk Assessment   Upper Extremity Assessment Upper Extremity Assessment: Generalized weakness    Lower Extremity Assessment Lower Extremity Assessment: Generalized weakness    Cervical / Trunk Assessment Cervical / Trunk Assessment: Kyphotic  Communication   Communication Communication: No apparent difficulties    Cognition Arousal: Alert Behavior During Therapy: Flat affect   PT - Cognitive impairments: History of cognitive impairments                       PT - Cognition Comments: Daughter present and reports he is at baseline (memory deficits) Following commands: Impaired Following commands impaired: Follows one step commands inconsistently, Follows one step commands with increased time     Cueing Cueing Techniques: Verbal cues, Tactile cues     General Comments General comments (skin integrity, edema, etc.): BP supine 122/88 HR 64; sitting 155/89 HR 69; sats on 2L 91%, on RA at rest 84%; on 2L walking 87%    Exercises     Assessment/Plan    PT Assessment Patient needs continued PT services  PT Problem List Decreased strength;Decreased activity tolerance;Decreased balance;Decreased mobility;Decreased cognition;Decreased knowledge of use of DME;Decreased safety awareness;Decreased knowledge of precautions;Cardiopulmonary status limiting activity       PT Treatment  Interventions DME instruction;Gait training;Functional mobility training;Therapeutic activities    PT Goals (Current goals can be found in the Care Plan section)  Acute Rehab PT Goals Patient Stated Goal: none stated PT Goal Formulation: With patient/family Time For Goal Achievement: 11/19/23 Potential to Achieve Goals: Good    Frequency Min 2X/week     Co-evaluation               AM-PAC PT "6 Clicks" Mobility  Outcome Measure Help needed turning from your back to your side while in a flat bed without using bedrails?: None Help needed moving from lying on your back to sitting on the side of a flat bed without using bedrails?: None Help needed moving to and from a bed to a chair (including a wheelchair)?: A Little Help needed standing up from a chair using your arms (e.g., wheelchair or bedside chair)?: A Little Help needed to walk in hospital room?: A Little Help needed climbing 3-5 steps with a railing? : A Little 6 Click Score: 20    End of Session Equipment Utilized During Treatment: Gait belt;Oxygen Activity Tolerance: Patient tolerated treatment well Patient left: in bed;with call bell/phone within reach;with family/visitor present Nurse Communication: Mobility status;Other (comment) (desaturated on RA at rest and walking on 2L) PT Visit Diagnosis: Other abnormalities  of gait and mobility (R26.89);Muscle weakness (generalized) (M62.81);History of falling (Z91.81)    Time: 3244-0102 PT Time Calculation (min) (ACUTE ONLY): 42 min   Charges:   PT Evaluation $PT Eval Low Complexity: 1 Low PT Treatments $Gait Training: 8-22 mins $Therapeutic Activity: 8-22 mins PT General Charges $$ ACUTE PT VISIT: 1 Visit          Gayle Kava, PT Acute Rehabilitation Services  Office [phone]   Guilford Leep 11/05/2023, 9:00 AM

## 2023-11-05 NOTE — ED Notes (Signed)
 Patient transported to MRI

## 2023-11-05 NOTE — Assessment & Plan Note (Signed)
 Continue Eliquis and Coreg

## 2023-11-05 NOTE — Assessment & Plan Note (Signed)
-   no s/s exac - Recent right/left heart cath and echo in December 2024: EF 40 to 45%, global hypokinesis, indeterminate diastology

## 2023-11-05 NOTE — ED Notes (Signed)
 This RN reviewed discharge instructions with patient. he verbalized understanding and denied any further questions. PT well appearing upon discharge and reports no pain. Pt wheeled out by EMT and is going home with daughter.

## 2023-11-09 ENCOUNTER — Other Ambulatory Visit (INDEPENDENT_AMBULATORY_CARE_PROVIDER_SITE_OTHER): Payer: Self-pay

## 2023-11-09 ENCOUNTER — Ambulatory Visit: Admitting: Physician Assistant

## 2023-11-09 DIAGNOSIS — M533 Sacrococcygeal disorders, not elsewhere classified: Secondary | ICD-10-CM

## 2023-11-09 DIAGNOSIS — G8929 Other chronic pain: Secondary | ICD-10-CM | POA: Diagnosis not present

## 2023-11-09 DIAGNOSIS — M25561 Pain in right knee: Secondary | ICD-10-CM

## 2023-11-09 DIAGNOSIS — M170 Bilateral primary osteoarthritis of knee: Secondary | ICD-10-CM

## 2023-11-09 DIAGNOSIS — M1711 Unilateral primary osteoarthritis, right knee: Secondary | ICD-10-CM

## 2023-11-09 DIAGNOSIS — M25562 Pain in left knee: Secondary | ICD-10-CM | POA: Diagnosis not present

## 2023-11-09 DIAGNOSIS — M1712 Unilateral primary osteoarthritis, left knee: Secondary | ICD-10-CM

## 2023-11-09 MED ORDER — METHYLPREDNISOLONE ACETATE 40 MG/ML IJ SUSP
40.0000 mg | INTRAMUSCULAR | Status: AC | PRN
  Administered 2023-11-09: 40 mg via INTRA_ARTICULAR

## 2023-11-09 MED ORDER — LIDOCAINE HCL 1 % IJ SOLN
3.0000 mL | INTRAMUSCULAR | Status: AC | PRN
  Administered 2023-11-09: 3 mL

## 2023-11-09 NOTE — Progress Notes (Signed)
 Office Visit Note   Patient: Julian Brown           Date of Birth: September 04, 1932           MRN: [id number] Visit Date: 11/09/2023              Requested by: Lonzie Robins, MD [address] [place],  [place] [address] PCP: Avva, Ravisankar, MD   Assessment & Plan: Visit Diagnoses:  1. Primary osteoarthritis of right knee   2. Chronic pain of left knee   3. Chronic pain of right knee   4. Sacral back pain   5. Primary osteoarthritis of left knee     Plan:  Spoke with family and the patient that most likely the sacral area of tenderness r represents contusion.  Recommend heat to the area.  We have provided him with cortisone injections both knees.  He will follow-up with us  as needed if pain persist or becomes worse.  Questions were encouraged and answered.  He knows to wait 3 months between cortisone injections both knees.  Follow-Up Instructions: Return if symptoms worsen or fail to improve.   Orders:  Orders Placed This Encounter  Procedures   Large Joint Inj: bilateral knee   XR Sacrum/Coccyx   XR Knee 1-2 Views Right   XR Knee 1-2 Views Left   No orders of the defined types were placed in this encounter.     Procedures: Large Joint Inj: bilateral knee on 11/09/2023 5:19 PM Indications: pain Details: 22 G 1.5 in needle, anterolateral approach  Arthrogram: No  Medications (Right): 3 mL lidocaine  1 %; 40 mg methylPREDNISolone  acetate 40 MG/ML Medications (Left): 3 mL lidocaine  1 %; 40 mg methylPREDNISolone  acetate 40 MG/ML Outcome: tolerated well, no immediate complications Procedure, treatment alternatives, risks and benefits explained, specific risks discussed. Consent was given by the patient. Immediately prior to procedure a time out was called to verify the correct patient, procedure, equipment, support staff and site/side marked as required. Patient was prepped and draped in the usual sterile fashion.       Clinical Data: No additional  findings.   Subjective: Chief Complaint  Patient presents with   Right Knee - Pain   Left Knee - Pain    HPI Julian Brown comes in today with bilateral knee pain.  He fell this past Saturday was seen at [facility].  Evidently lost his footing while outside walking.  He has been able to bear weight but is complaining of "hip pain but points to his low back sacral area.  He is unsure if he hit either knee during the fall.  Within this time for him to have cortisone injections in both knees.  He denies any numbness tingling down either leg.  Pelvis was performed on 11/04/2023 in the ER and shows no acute fractures.  Both hips well located.  No acute findings.  He does ambulate with a rollator at home.  However today he is assisted by his wife and daughter to ambulate.  Review of Systems See HPI otherwise negative   Objective: Vital Signs: There were no vitals taken for this visit.  Physical Exam Constitutional:      Appearance: He is not ill-appearing or diaphoretic.  Neurological:     Mental Status: He is alert and oriented to person, place, and time.  Psychiatric:        Mood and Affect: Mood normal.     Ortho Exam Bilateral hips: Good range of motion of both  hands.  Slight guarding with internal rotation of the left hip.  Exquisitely tight hamstrings bilaterally. Lumbar sacral: Has tenderness on the left at the region of the top of the sacrum.  There is no bruising in this area.  Nontender over the spinal column and paraspinous regions bilaterally otherwise. Bilateral knees: He lacks full extension of bilateral knees secondary to tight hamstrings.  Flexion of 90 degrees bilaterally.  No abnormal warmth erythema or effusion.  Tenderness over the right knee pes anserinus area.  Tenderness left knee over the medial and lateral joint line.  No gross instability valgus varus stressing.  Specialty Comments:  No specialty comments available.  Imaging: XR Knee 1-2 Views Left Result  Date: 11/09/2023 Left knee 2 views: No acute fractures.  Tricompartmental changes with slight advancement of the lateral compartment which is nearing bone-on-bone.  No  acute findings.  XR Knee 1-2 Views Right Result Date: 11/09/2023 Right knee: 2 views: No acute findings acute fractures.  Knee is well located.  Slight advancement of the medial joint line narrowing.  Lateral joint line mild arthritic changes unchanged from films in 2021.  Patellofemoral joint slight advancement arthritic changes.  XR Sacrum/Coccyx Result Date: 11/09/2023 Sacrum coccyx: Multiple views showed no acute fractures no acute findings.  Deep mineralization of the coccyx noted.  Sclerotic changes SI joints bilaterally.    PMFS History: Patient Active Problem List   Diagnosis Date Noted   Physical deconditioning 11/04/2023   Chronic systolic CHF (congestive heart failure) (HCC) 11/04/2023   History of CAD (coronary artery disease) 11/04/2023   Chronic hypoxic respiratory failure (HCC) 11/04/2023   History of COPD 11/04/2023   Fall at home, initial encounter 11/04/2023   Atypical atrial flutter (HCC) 07/20/2023   Hypercoagulable state due to paroxysmal atrial fibrillation (HCC) 07/20/2023   Acute heart failure with mildly reduced ejection fraction (HFmrEF, 41-49%) (HCC) 06/13/2023   Paroxysmal atrial fibrillation (HCC) 06/13/2023   ACS (acute coronary syndrome) (HCC) 06/09/2023   NSTEMI (non-ST elevated myocardial infarction) (HCC) 06/09/2023   Malignant neoplasm of supraglottis (HCC) 08/08/2019   Hoarseness of voice 06/12/2019   Cough 05/23/2019   Primary osteoarthritis of right wrist 07/31/2018   Stage 2 moderate COPD by GOLD classification (HCC) 05/16/2018   Allergic rhinitis 05/16/2018   Nocturnal hypoxemia 05/16/2018   Abnormal finding on lung imaging 05/16/2018   Hypertension    Hyperlipemia    Acute gangrenous cholecystitis s/p lap cholecystectomy 06/08/2017 06/08/2017   Pain in right wrist 01/04/2017    Chronic pain of left knee 01/04/2017   Chronic pain of right knee 01/04/2017   Unilateral primary osteoarthritis, left knee 01/04/2017   Unilateral primary osteoarthritis, right knee 01/04/2017   Past Medical History:  Diagnosis Date   Acute gangrenous cholecystitis s/p lap cholecystectomy 06/08/2017 06/08/2017   Arthritis    Colon cancer [facility])    ED (erectile dysfunction)    Emphysema of lung (HCC)    Hyperlipemia    Hypertension    Prostate cancer (HCC)    prostate cancer-tx radiation   Wears glasses     No family history on file.  Past Surgical History:  Procedure Laterality Date   APPENDECTOMY     CARPAL TUNNEL RELEASE  5/12   lt   CARPAL TUNNEL RELEASE  12/13/2011   Procedure: CARPAL TUNNEL RELEASE;  Surgeon: Amelie Baize., MD;  Location: [facility];  Service: Orthopedics;  Laterality: Right;  and inject right wrist   CHOLECYSTECTOMY N/A 06/08/2017   Procedure:  LAPAROSCOPIC CHOLECYSTECTOMY WITH LYSIS OF ADHESIONS;  Surgeon: Juanita Norlander, MD;  Location: [facility];  Service: General;  Laterality: N/A;   COLON SURGERY  1997   hemicolectomy-rt-ca   COLONOSCOPY     about 12 inches of colon removed due to colon cancer   IR GASTROSTOMY TUBE MOD SED  11/06/2019   IR GASTROSTOMY TUBE REMOVAL  01/22/2020   RIGHT/LEFT HEART CATH AND CORONARY ANGIOGRAPHY N/A 06/12/2023   Procedure: RIGHT/LEFT HEART CATH AND CORONARY ANGIOGRAPHY;  Surgeon: Arnoldo Lapping, MD;  Location: [facility];  Service: Cardiovascular;  Laterality: N/A;   Social History   Occupational History   Not on file  Tobacco Use   Smoking status: Former    Current packs/day: 0.00    Average packs/day: 1 pack/day for 36.0 years (36.0 ttl pk-yrs)    Types: Cigarettes    Start date: 12/09/1950    Quit date: 12/09/1986    Years since quitting: 36.9   Smokeless tobacco: Never   Tobacco comments:    Former smoker 07/20/23  Vaping Use   Vaping status: Never Used  Substance and Sexual  Activity   Alcohol use: Yes    Comment: occ   Drug use: No   Sexual activity: Not Currently

## 2023-11-28 NOTE — Progress Notes (Unsigned)
 Cardiology Office Note    Date:  11/30/2023  ID:  Julian Brown, DOB March 10, 1933, MRN [id number] PCP:  Julian Brown, Brown  Cardiologist:  Julian Brown, Brown  Electrophysiologist:  None   Chief Complaint: Follow up for atrial fibrillation and HFrEF   History of Present Illness: .    Julian Brown is a 89 y.o. 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  On 06/08/2023 he presented to the emergency room for elevated heart rates in the 130s.  His EKG in the ED was overall difficult to discern type of SVT as he had wandering baseline.  He was given 5 mg of IV Lopressor  which reportedly showed atrial flutter and reportedly fibrillation but spontaneously converted back to normal sinus rhythm with frequent PACs and PVCs.  Troponins were elevated at 1868  then 2131.  His CTA was negative for PE.  Patient was overall asymptomatic.  He denies chest pain or shortness of breath.  He underwent cardiac catheterization on 12/30 with PL CX 60%, M RCA 40%, P RCA 30% with recommendations for medical management.  Echo showed LVEF 40 to 45%, global hypokinesis and normal RV, felt to be tachycardia mediated.  Initially the plan was to start Eliquis  but patient had episodes of hemoptysis following heart catheterization.  Recommended to retrial Eliquis  as an outpatient as long as he has no further bleeding.  He is started on losartan  12.5 mg daily and carvedilol  3.125 mg twice daily.  Patient was seen in clinic on 06/22/2018 for follow-up.  He reported that he is doing well overall.  He denied any further chest pain or shortness of breath.  He denied any feelings of palpitations or increased heart rate he denied any problems of significant bleeding.  Patient's cardiac monitor was placed, after an episode of atrial fibrillation with RVR he was referred to A-fib clinic.  He  was seen on 07/20/2023.  He was started on amiodarone  200 mg daily and continued on carvedilol  3.125 mg twice daily.  Cardiac monitor worn for 26 days indicated predominant rhythm was sinus rhythm with average rate of 68 bpm and slowest heart rate of 45 bpm he had a total of 10 hours and 33 minutes of atrial fibrillation with an A-fib burden of 2%.  Longest A-fib episode lasted 1 hour and 5 minutes with fastest A-fib at 160 bpm, slowest was 48 with a mean of 82 bpm there was a 5% PVC burden 18% PAC burden there were no prolonged pauses.  Patient was seen in clinic on 08/28/2023.  He had remained stable from a cardiac standpoint.  On 11/04/2023 patient presented to the ED via EMS after a fall.  Patient was walking in the neighborhood with his wife when he tripped and fell and landed on his bottom, he denied hitting his head or losing consciousness.  EMS reported on arrival he was ANO x 2, they also noted that he was hypoxic to 88% on room air and placed him on 2 L nasal cannula.  Patient was admitted for weakness and confusion.  Workup was overall negative with no obvious signs of infection and mentation returned to baseline while monitoring in the ER.  CT head did show old right basal ganglia infarct, patient did have subtle right sided weakness and was sent for brain MRI, this was negative for acute findings.  Chest x-ray was unremarkable.  Oxygen was  continued at discharge.  Today he presents for follow-up.  He reports that he is doing well overall. He denies any chest pain, shortness of breath, lower extremity edema, orthponea or pnd.  He denies any palpitations, presyncope or syncope.  Patient's wife and daughter report that he seems have been doing very well, has noted some weight loss.  ROS: .   Today he denies chest pain, shortness of breath, lower extremity edema, fatigue, palpitations, melena, hematuria, hemoptysis, diaphoresis, weakness, presyncope, syncope, orthopnea, and PND.  All other systems are  reviewed and otherwise negative.  Studies Reviewed: Julian Brown   EKG:  EKG is not ordered today.  CV Studies: Cardiac studies reviewed are outlined and summarized above. Otherwise please see EMR for full report. Cardiac Studies & Procedures   ______________________________________________________________________________________________ CARDIAC CATHETERIZATION  CARDIAC CATHETERIZATION 06/12/2023  Conclusion   Ost RCA to Prox RCA lesion is 30% stenosed.   Mid RCA lesion is 40% stenosed.   Prox Cx lesion is 60% stenosed.  Patent left main with no stenosis Patent LAD with no stenosis Moderate stenosis of the proximal circumflex, estimated at 60-70%, appropriate for medical therapy Patent RCA (dominant vessel) with mild nonobstructive plaquing (30% proximal and 40% mid-vessel stenosis) Normal LVEDP  Recommendations: start apixaban  tomorrow morning for new atrial fibrillation. Med Rx for CAD.  Findings Coronary Findings Diagnostic  Dominance: Right  Left Main The vessel exhibits minimal luminal irregularities.  Left Anterior Descending The vessel exhibits minimal luminal irregularities.  Left Circumflex Prox Cx lesion is 60% stenosed.  Right Coronary Artery Ost RCA to Prox RCA lesion is 30% stenosed. Mid RCA lesion is 40% stenosed.  Intervention  No interventions have been documented.     ECHOCARDIOGRAM  ECHOCARDIOGRAM COMPLETE 06/09/2023  Narrative ECHOCARDIOGRAM REPORT    Patient Name:   Julian Brown [facility] Date of Exam: 06/09/2023 Medical Rec #:  [id number]       Height:       69.0 in Accession #:    [id number]      Weight:       156.5 lb Date of Birth:  12/19/1932      BSA:          1.861 m Patient Age:    89 years        BP:           144/70 mmHg Patient Gender: M               HR:           82 bpm. Exam Location:  Inpatient  Procedure: 2D Echo  Indications:    nstemi  History:        Patient has no prior history of Echocardiogram examinations.  Sonographer:     Terrilee Few Referring Phys: [id number] Philmore Bream  IMPRESSIONS   1. Left ventricular ejection fraction, by estimation, is 40 to 45%. The left ventricle has mildly decreased function. The left ventricle demonstrates global hypokinesis. The left ventricular internal cavity size was mildly dilated. Left ventricular diastolic parameters are indeterminate. 2. Right ventricular systolic function is normal. The right ventricular size is normal. 3. Left atrial size was mildly dilated. 4. The mitral valve is normal in structure. Mild mitral valve regurgitation. No evidence of mitral stenosis. 5. The aortic valve is tricuspid. Aortic valve regurgitation is not visualized. Aortic valve sclerosis/calcification is present, without any evidence of aortic stenosis. 6. The inferior vena cava is dilated in size with >50% respiratory variability, suggesting right atrial pressure of 8  mmHg.  Comparison(s): No prior Echocardiogram.  FINDINGS Left Ventricle: Left ventricular ejection fraction, by estimation, is 40 to 45%. The left ventricle has mildly decreased function. The left ventricle demonstrates global hypokinesis. The left ventricular internal cavity size was mildly dilated. There is no left ventricular hypertrophy. Left ventricular diastolic parameters are indeterminate.  Right Ventricle: The right ventricular size is normal. Right ventricular systolic function is normal.  Left Atrium: Left atrial size was mildly dilated.  Right Atrium: Right atrial size was normal in size.  Pericardium: There is no evidence of pericardial effusion.  Mitral Valve: The mitral valve is normal in structure. Mild mitral valve regurgitation. No evidence of mitral valve stenosis.  Tricuspid Valve: The tricuspid valve is normal in structure. Tricuspid valve regurgitation is trivial. No evidence of tricuspid stenosis.  Aortic Valve: The aortic valve is tricuspid. Aortic valve regurgitation is not visualized. Aortic  valve sclerosis/calcification is present, without any evidence of aortic stenosis. Aortic valve peak gradient measures 7.8 mmHg.  Pulmonic Valve: The pulmonic valve was normal in structure. Pulmonic valve regurgitation is mild. No evidence of pulmonic stenosis.  Aorta: The aortic root is normal in size and structure.  Venous: The inferior vena cava is dilated in size with greater than 50% respiratory variability, suggesting right atrial pressure of 8 mmHg.  IAS/Shunts: No atrial level shunt detected by color flow Doppler.   LEFT VENTRICLE PLAX 2D LVIDd:         5.40 cm     Diastology LVIDs:         4.40 cm     LV e' medial:    9.03 cm/s LV PW:         0.90 cm     LV E/e' medial:  7.0 LV IVS:        0.60 cm     LV e' lateral:   12.00 cm/s LVOT diam:     2.10 cm     LV E/e' lateral: 5.3 LV SV:         65 LV SV Index:   35 LVOT Area:     3.46 cm  LV Volumes (MOD) LV vol d, MOD A2C: 84.2 ml LV vol d, MOD A4C: 62.0 ml LV vol s, MOD A2C: 41.9 ml LV vol s, MOD A4C: 29.7 ml LV SV MOD A2C:     42.3 ml LV SV MOD A4C:     62.0 ml LV SV MOD BP:      39.1 ml  RIGHT VENTRICLE             IVC RV S prime:     16.80 cm/s  IVC diam: 2.20 cm TAPSE (M-mode): 1.9 cm  LEFT ATRIUM             Index        RIGHT ATRIUM           Index LA diam:        2.80 cm 1.50 cm/m   RA Area:     15.40 cm LA Vol (A2C):   74.3 ml 39.91 ml/m  RA Volume:   36.00 ml  19.34 ml/m LA Vol (A4C):   44.9 ml 24.12 ml/m LA Biplane Vol: 61.9 ml 33.25 ml/m AORTIC VALVE AV Area (Vmax): 2.47 cm AV Vmax:        140.00 cm/s AV Peak Grad:   7.8 mmHg LVOT Vmax:      100.00 cm/s LVOT Vmean:     63.200 cm/s LVOT VTI:  0.188 m  AORTA Ao Root diam: 3.40 cm Ao Asc diam:  3.20 cm  MITRAL VALVE MV Area (PHT): 2.62 cm    SHUNTS MV Decel Time: 289 msec    Systemic VTI:  0.19 m MR Peak grad: 28.4 mmHg    Systemic Diam: 2.10 cm MR Vmax:      266.50 cm/s MV E velocity: 63.20 cm/s MV A velocity: 46.40 cm/s MV E/A  ratio:  1.36  Julian Brown Electronically signed by Julian Brown Signature Date/Time: 06/09/2023/3:55:35 PM    Final    MONITORS  CARDIAC EVENT MONITOR 07/27/2023  Narrative The patient was monitored from January 10 through July 24, 2023 (26 days, 10 hours and 49 minutes).  The predominant rhythm was sinus rhythm with an average rate at 68 and slowest heart rate 45 bpm.  Patient had a total of 10 hours and 33 minutes in atrial fibrillation with an AF burden of 2%.  The longest AF episode lasted 1 hour and 5 minutes with the fastest AF at 160 bpm on July 11, 2023.  This slowest AF was 48 bpm with mean of 82 bpm.  There was a 5% PVC burden and 18% PAC burden.  There were no prolonged pauses.       ______________________________________________________________________________________________       Current Reported Medications:.    Current Meds  Medication Sig   amiodarone  (PACERONE ) 200 MG tablet Take 0.5 tablets (100 mg total) by mouth daily. (Patient taking differently: Take 100 mg by mouth at bedtime.)   apixaban  (ELIQUIS ) 5 MG TABS tablet Take 1 tablet (5 mg total) by mouth 2 (two) times daily.   carvedilol  (COREG ) 3.125 MG tablet Take 1 tablet (3.125 mg total) by mouth 2 (two) times daily with a meal.   losartan  (COZAAR ) 25 MG tablet Take 0.5 tablets (12.5 mg total) by mouth daily.   Probiotic Product (PROBIOTIC PO) Take 1 capsule by mouth daily.   rosuvastatin  (CRESTOR ) 10 MG tablet Take 1 tablet (10 mg total) by mouth daily.   VITAMIN D  PO Take 1 capsule by mouth daily.    Physical Exam:    VS:  BP (!) 124/54   Pulse (!) 54   Ht 5' 11 (1.803 m)   Wt 140 lb 6.4 oz (63.7 kg)   SpO2 95%   BMI 19.58 kg/m    Wt Readings from Last 3 Encounters:  11/29/23 140 lb 6.4 oz (63.7 kg)  11/04/23 153 lb (69.4 kg)  10/19/23 153 lb (69.4 kg)    GEN: Well nourished, well developed in no acute distress NECK: No JVD; No carotid bruits CARDIAC: RRR, no murmurs,  rubs, gallops RESPIRATORY:  Clear to auscultation without rales, wheezing or rhonchi  ABDOMEN: Soft, non-tender, non-distended EXTREMITIES:  No edema; No acute deformity     Asessement and Plan:.    pAF/Atrial flutter:  Patient admitted in 05/2023 with possible atrial fibrillation versus atrial flutter.  Patient was started on Eliquis  5 mg twice daily.  Cardiac monitor indicated a 2% A-fib burden, patient was referred to A-fib clinic and started on amiodarone  200 mg daily.  Patient denies any palpitations or feeling of being in atrial fibrillation.  His amiodarone  was reduced to 100 mg daily on follow-up with A-fib clinic.  He denies any bleeding problems on Eliquis .  Continue carvedilol  3.125 mg twice daily and amiodarone  100 mg daily, Eliquis  5 mg twice daily. CHA2DS2-VASc Score = 4 [CHF History: 1, HTN History: 0, Diabetes History: 0, Stroke  History: 0, Vascular Disease History: 1, Age Score: 2, Gender Score: 0].  Therefore, the patient's annual risk of stroke is 4.8 %.      CAD: Cardiac cath on 06/12/2023 with PL CX 60%, mRCA 40%, P RCA 30%, recommended for medical management. Elevations in troponin felt due to tachycardia. Stable with no anginal symptoms. No indication for ischemic evaluation.  No ASA given need for Eliquis .  Continue losartan  12.5 mg daily, Crestor  10 mg daily and carvedilol  3.125 mg twice daily.  HFrEF/NICM: Echo during admission indicated LVEF 40 to 45%, global hypokinesis and normal RV.  Felt to be tachycardia mediated.  Patient reports that he is been feeling well, denies any palpitations or feeling of increased heart rates.  He denies increased shortness of breath, lower extremity edema, orthopnea or PND.  He appears euvolemic and well compensated on exam.  Will check echocardiogram to reevaluate EF.  Continue carvedilol  3.125 mg twice daily and losartan  12.5 mg daily.  Malignant neoplasm of supraglottis: History of partial mass removal and radiation, follows with oncology.   Reportedly has had no evidence of recurrence.  During admission in December patient had a few episodes of hemoptysis while on IV heparin .  Patient denies any evidence of further bleeding.  COPD/sleep short breathing: Previously noted of pulmonary hypertension on prior imaging and noted to have nocturnal hypoxemia on chart. Patient follows with pulmonology.  Hyperlipidemia: Last lipid profile on 09/08/2023 indicated total cholesterol 131, HDL 51, triglycerides 55 and LDL 68.  Continue rosuvastatin  10 mg daily.   Disposition: F/u with Dr. Abel Hoe in 4-5 months.   Signed, Julian Klepacki D Jenaro Souder, NP

## 2023-11-29 ENCOUNTER — Encounter: Payer: Self-pay | Admitting: Cardiology

## 2023-11-29 ENCOUNTER — Ambulatory Visit: Attending: Cardiology | Admitting: Cardiology

## 2023-11-29 VITALS — BP 124/54 | HR 54 | Ht 71.0 in | Wt 140.4 lb

## 2023-11-29 DIAGNOSIS — I484 Atypical atrial flutter: Secondary | ICD-10-CM

## 2023-11-29 DIAGNOSIS — D6869 Other thrombophilia: Secondary | ICD-10-CM | POA: Insufficient documentation

## 2023-11-29 DIAGNOSIS — J449 Chronic obstructive pulmonary disease, unspecified: Secondary | ICD-10-CM | POA: Insufficient documentation

## 2023-11-29 DIAGNOSIS — C321 Malignant neoplasm of supraglottis: Secondary | ICD-10-CM | POA: Diagnosis present

## 2023-11-29 DIAGNOSIS — I5020 Unspecified systolic (congestive) heart failure: Secondary | ICD-10-CM | POA: Diagnosis present

## 2023-11-29 DIAGNOSIS — I480 Paroxysmal atrial fibrillation: Secondary | ICD-10-CM | POA: Insufficient documentation

## 2023-11-29 DIAGNOSIS — I2510 Atherosclerotic heart disease of native coronary artery without angina pectoris: Secondary | ICD-10-CM | POA: Diagnosis present

## 2023-11-29 DIAGNOSIS — E782 Mixed hyperlipidemia: Secondary | ICD-10-CM

## 2023-11-29 NOTE — Patient Instructions (Signed)
 Medication Instructions:  No changes *If you need a refill on your cardiac medications before your next appointment, please call your pharmacy*  Lab Work: No labs If you have labs (blood work) drawn today and your tests are completely normal, you will receive your results only by: MyChart Message (if you have MyChart) OR A paper copy in the mail If you have any lab test that is abnormal or we need to change your treatment, we will call you to review the results.  Testing/Procedures: Your physician has requested that you have an echocardiogram. Echocardiography is a painless test that uses sound waves to create images of your heart. It provides your doctor with information about the size and shape of your heart and how well your heart's chambers and valves are working. This procedure takes approximately one hour. There are no restrictions for this procedure. Please do NOT wear cologne, perfume, aftershave, or lotions (deodorant is allowed). Please arrive 15 minutes prior to your appointment time.  Please note: We ask at that you not bring children with you during ultrasound (echo/ vascular) testing. Due to room size and safety concerns, children are not allowed in the ultrasound rooms during exams. Our front office staff cannot provide observation of children in our lobby area while testing is being conducted. An adult accompanying a patient to their appointment will only be allowed in the ultrasound room at the discretion of the ultrasound technician under special circumstances. We apologize for any inconvenience.]  Follow-Up: At [facility], you and your health needs are our priority.  As part of our continuing mission to provide you with exceptional heart care, our providers are all part of one team.  This team includes your primary Cardiologist (physician) and Advanced Practice Providers or APPs (Physician Assistants and Nurse Practitioners) who all work together to provide you with  the care you need, when you need it.  Your next appointment:   4-5 month(s)  Provider:   Antoinette Batman, MD  We recommend signing up for the patient portal called MyChart.  Sign up information is provided on this After Visit Summary.  MyChart is used to connect with patients for Virtual Visits (Telemedicine).  Patients are able to view lab/test results, encounter notes, upcoming appointments, etc.  Non-urgent messages can be sent to your provider as well.   To learn more about what you can do with MyChart, go to [email or web address].

## 2023-11-30 ENCOUNTER — Encounter: Payer: Self-pay | Admitting: Cardiology

## 2023-12-07 ENCOUNTER — Telehealth: Payer: Self-pay | Admitting: Cardiology

## 2023-12-07 NOTE — Telephone Encounter (Signed)
 Spoke with Pts daughter per DPR States pt went to PCP today and they suggested that she contact us  to decrease BP meds. Olam states pts bp was on the low end although she could not give me exact readings. Looks like 102/60 from care everywhere. Myles Olam I would forward to Katlyn West to review. Olam stated understanding.

## 2023-12-07 NOTE — Telephone Encounter (Signed)
 Pt c/o medication issue:  1. Name of Medication:   carvedilol  (COREG ) 3.125 MG tablet    2. How are you currently taking this medication (dosage and times per day)?  Take 1 tablet (3.125 mg total) by mouth 2 (two) times daily with a meal.      3. Are you having a reaction (difficulty breathing--STAT)? No  4. What is your medication issue? Pt's daughter is requesting a callback regarding her wanting to know if pt can have this medication dosage reduced. Please advise

## 2023-12-08 ENCOUNTER — Inpatient Hospital Stay: Payer: Medicare Other | Admitting: Adult Health

## 2023-12-26 ENCOUNTER — Other Ambulatory Visit: Payer: Self-pay | Admitting: Internal Medicine

## 2023-12-26 DIAGNOSIS — R634 Abnormal weight loss: Secondary | ICD-10-CM

## 2023-12-29 ENCOUNTER — Telehealth: Payer: Self-pay | Admitting: Cardiovascular Disease

## 2023-12-29 ENCOUNTER — Ambulatory Visit
Admission: RE | Admit: 2023-12-29 | Discharge: 2023-12-29 | Disposition: A | Discharge status: Home / Self Care | Source: Ambulatory Visit | Attending: Internal Medicine | Admitting: Internal Medicine

## 2023-12-29 DIAGNOSIS — R634 Abnormal weight loss: Secondary | ICD-10-CM

## 2023-12-29 MED ORDER — IOPAMIDOL (ISOVUE-300) INJECTION 61%
100.0000 mL | Freq: Once | INTRAVENOUS | Status: AC | PRN
  Administered 2023-12-29: 100 mL via INTRAVENOUS

## 2023-12-29 NOTE — Telephone Encounter (Signed)
 Spoke with pt's daughter who reports pt's BP's today as below.  She states pt has had a pelvic CT today with contrast and did have an episode of diarrhea.  She confirms pt is not having CP, SOB or dizziness/fainting.  Encouraged pt's daughter to ensure hydration with water and provide a salty snack. Spoke with DOD, Dr Francyne who recommends holing Carvedilol  3.125 tonight.  May restart Carvedilol  tomorrow but should continue to hold Losartan  until pt's BP returns to baseline.  Advised of on-call provider for the weekend and ED precautions.  Pt's daughter verbalizes understanding and agrees with current plan.

## 2023-12-29 NOTE — Telephone Encounter (Signed)
 Pt c/o BP issue: STAT if pt c/o blurred vision, one-sided weakness or slurred speech.  STAT if BP is GREATER than 180/120 TODAY.  STAT if BP is LESS than 90/60 and SYMPTOMATIC TODAY  1. What is your BP concern? BP running low  2. Have you taken any BP medication today? Yes  3. What are your last 5 BP readings? 103/45 - 3:40 today (after drinking water) 92/52 - 3:10 today 78/41 - Today 72/42 - Today 78/47 - Today   4. Are you having any other symptoms (ex. Dizziness, headache, blurred vision, passed out)? No

## 2024-01-04 ENCOUNTER — Other Ambulatory Visit: Payer: Self-pay | Admitting: Internal Medicine

## 2024-01-04 DIAGNOSIS — N281 Cyst of kidney, acquired: Secondary | ICD-10-CM

## 2024-01-10 ENCOUNTER — Ambulatory Visit (HOSPITAL_COMMUNITY)
Admission: RE | Admit: 2024-01-10 | Discharge: 2024-01-10 | Disposition: A | Discharge status: Home / Self Care | Source: Ambulatory Visit | Attending: Cardiovascular Disease | Admitting: Cardiovascular Disease

## 2024-01-10 DIAGNOSIS — I484 Atypical atrial flutter: Secondary | ICD-10-CM | POA: Insufficient documentation

## 2024-01-10 DIAGNOSIS — D6869 Other thrombophilia: Secondary | ICD-10-CM | POA: Diagnosis present

## 2024-01-10 DIAGNOSIS — I5020 Unspecified systolic (congestive) heart failure: Secondary | ICD-10-CM | POA: Insufficient documentation

## 2024-01-10 DIAGNOSIS — E782 Mixed hyperlipidemia: Secondary | ICD-10-CM | POA: Insufficient documentation

## 2024-01-10 DIAGNOSIS — I480 Paroxysmal atrial fibrillation: Secondary | ICD-10-CM | POA: Insufficient documentation

## 2024-01-10 DIAGNOSIS — C321 Malignant neoplasm of supraglottis: Secondary | ICD-10-CM | POA: Diagnosis present

## 2024-01-10 DIAGNOSIS — J449 Chronic obstructive pulmonary disease, unspecified: Secondary | ICD-10-CM | POA: Insufficient documentation

## 2024-01-10 DIAGNOSIS — I2510 Atherosclerotic heart disease of native coronary artery without angina pectoris: Secondary | ICD-10-CM | POA: Diagnosis present

## 2024-01-11 ENCOUNTER — Ambulatory Visit: Payer: Self-pay | Admitting: Cardiology

## 2024-01-11 LAB — ECHOCARDIOGRAM COMPLETE
Area-P 1/2: 2.12 cm2
MV M vel: 5.85 m/s
MV Peak grad: 136.9 mmHg
Radius: 0.7 cm
S' Lateral: 3.6 cm

## 2024-01-15 ENCOUNTER — Ambulatory Visit
Admission: RE | Admit: 2024-01-15 | Discharge: 2024-01-15 | Disposition: A | Discharge status: Home / Self Care | Source: Ambulatory Visit | Attending: Internal Medicine | Admitting: Internal Medicine

## 2024-01-15 DIAGNOSIS — N281 Cyst of kidney, acquired: Secondary | ICD-10-CM

## 2024-01-15 MED ORDER — GADOPICLENOL 0.5 MMOL/ML IV SOLN
6.0000 mL | Freq: Once | INTRAVENOUS | Status: AC | PRN
  Administered 2024-01-15: 6 mL via INTRAVENOUS

## 2024-01-17 ENCOUNTER — Other Ambulatory Visit

## 2024-02-15 ENCOUNTER — Encounter: Payer: Self-pay | Admitting: Physician Assistant

## 2024-02-15 ENCOUNTER — Ambulatory Visit (INDEPENDENT_AMBULATORY_CARE_PROVIDER_SITE_OTHER): Admitting: Physician Assistant

## 2024-02-15 DIAGNOSIS — M1711 Unilateral primary osteoarthritis, right knee: Secondary | ICD-10-CM

## 2024-02-15 DIAGNOSIS — M1712 Unilateral primary osteoarthritis, left knee: Secondary | ICD-10-CM

## 2024-02-15 MED ORDER — LIDOCAINE HCL 2 % IJ SOLN
5.0000 mL | INTRAMUSCULAR | Status: AC | PRN
Start: 2024-02-15 — End: 2024-02-15
  Administered 2024-02-15: 5 mL

## 2024-02-15 MED ORDER — METHYLPREDNISOLONE ACETATE 40 MG/ML IJ SUSP
40.0000 mg | INTRAMUSCULAR | Status: AC | PRN
Start: 2024-02-15 — End: 2024-02-15
  Administered 2024-02-15: 40 mg via INTRA_ARTICULAR

## 2024-02-15 MED ORDER — LIDOCAINE HCL 1 % IJ SOLN
5.0000 mL | INTRAMUSCULAR | Status: AC | PRN
Start: 2024-02-15 — End: 2024-02-15
  Administered 2024-02-15: 5 mL

## 2024-02-15 NOTE — Progress Notes (Signed)
   Procedure Note  Patient: Julian Brown             Date of Birth: 10/06/1932           MRN: [id number]             Visit Date: 02/15/2024 HPI: Julian Brown returns today due to bilateral knee pain.  He was last seen on 11/09/2023 and was given bilateral knee cortisone injections.  He states the injections lasted until just recently.  No injuries of either knee.  No fevers or chills.  He is nondiabetic.  Physical exam: Bilateral knees lacks few degrees of full extension bilateral knees.  Flexion to beyond 90 degrees bilaterally.  No abnormal warmth erythema of either knee.  Bilateral knees with positive effusions.  Procedures: Visit Diagnoses:  1. Primary osteoarthritis of right knee   2. Primary osteoarthritis of left knee     Large Joint Inj: bilateral knee on 02/15/2024 5:13 PM Indications: pain Details: 22 G 1.5 in needle, superolateral approach  Arthrogram: No  Medications (Right): 5 mL lidocaine  1 %; 40 mg methylPREDNISolone  acetate 40 MG/ML Aspirate (Right): 39 mL yellow and blood-tinged Medications (Left): 5 mL lidocaine  2 %; 40 mg methylPREDNISolone  acetate 40 MG/ML Aspirate (Left): 27 mL yellow and blood-tinged Outcome: tolerated well, no immediate complications Procedure, treatment alternatives, risks and benefits explained, specific risks discussed. Consent was given by the patient. Immediately prior to procedure a time out was called to verify the correct patient, procedure, equipment, support staff and site/side marked as required. Patient was prepped and draped in the usual sterile fashion.    Plan: He will remove the Ace wrap's in his knees  before retiring bed.  Follow-up with us  as needed.  Knows to wait 3 months between injections.  Questions were encouraged and answered.

## 2024-02-16 NOTE — Telephone Encounter (Signed)
 See 12/29/23 telephone note

## 2024-04-02 ENCOUNTER — Ambulatory Visit: Attending: Cardiovascular Disease | Admitting: Cardiovascular Disease

## 2024-04-02 ENCOUNTER — Encounter: Payer: Self-pay | Admitting: Cardiovascular Disease

## 2024-04-02 VITALS — BP 142/78 | HR 62 | Ht 71.0 in | Wt 143.0 lb

## 2024-04-02 DIAGNOSIS — I2510 Atherosclerotic heart disease of native coronary artery without angina pectoris: Secondary | ICD-10-CM | POA: Insufficient documentation

## 2024-04-02 DIAGNOSIS — I10 Essential (primary) hypertension: Secondary | ICD-10-CM | POA: Insufficient documentation

## 2024-04-02 DIAGNOSIS — I428 Other cardiomyopathies: Secondary | ICD-10-CM | POA: Diagnosis present

## 2024-04-02 DIAGNOSIS — E782 Mixed hyperlipidemia: Secondary | ICD-10-CM | POA: Diagnosis not present

## 2024-04-02 DIAGNOSIS — I480 Paroxysmal atrial fibrillation: Secondary | ICD-10-CM | POA: Diagnosis not present

## 2024-04-02 NOTE — Patient Instructions (Signed)
 Medication Instructions:  Your physician recommends that you continue on your current medications as directed. Please refer to the Current Medication list given to you today.  *If you need a refill on your cardiac medications before your next appointment, please call your pharmacy*  Lab Work: none If you have labs (blood work) drawn today and your tests are completely normal, you will receive your results only by: MyChart Message (if you have MyChart) OR A paper copy in the mail If you have any lab test that is abnormal or we need to change your treatment, we will call you to review the results.  Testing/Procedures: none  Follow-Up: At [facility], you and your health needs are our priority.  As part of our continuing mission to provide you with exceptional heart care, our providers are all part of one team.  This team includes your primary Cardiologist (physician) and Advanced Practice Providers or APPs (Physician Assistants and Nurse Practitioners) who all work together to provide you with the care you need, when you need it.  Your next appointment:   May/June 2026 Provider:   Lonni Cash, MD    We recommend signing up for the patient portal called MyChart.  Sign up information is provided on this After Visit Summary.  MyChart is used to connect with patients for Virtual Visits (Telemedicine).  Patients are able to view lab/test results, encounter notes, upcoming appointments, etc.  Non-urgent messages can be sent to your provider as well.   To learn more about what you can do with MyChart, go to [email or web address].   Other Instructions

## 2024-04-02 NOTE — Progress Notes (Signed)
 Chief Complaint  Patient presents with   Follow-up    Atrial fib   History of Present Illness: 89 yo male with history of atrial fibrillation, COPD, non-ischemic cardiomyopathy, CAD, HLD, HTN, prostate cancer and colon cancer here today for follow up. He was seen in the ED in December 2024 with tachycardia and was found to have SVT with conversion to atrial flutter following IV Lopressor . He converted to sinus in the ED. He was seen by Dr. Burnard. Cardiac cath 06/12/23 with moderate non-obstructive CAD (60% proximal Circumflex stenosis, 40% mid RCA stenosis). Echo December 2024 with LVEF=40-45% with global hypokinesis felt to be tachycardia mediated. Cardiac monitor February 2025 with recurrence of atrial fibrillation. He has been followed in the atrial fib clinic. He has been on Coreg , amiodarone  and Eliquis . Echo July 2025 with LVEF=55-60%. Mild MR.   He is here today for follow up. The patient denies any chest pain, dyspnea, palpitations, lower extremity edema, orthopnea, PND, dizziness, near syncope or syncope.   Primary Care Physician: Janey Santos, MD   Past Medical History:  Diagnosis Date   Acute gangrenous cholecystitis s/p lap cholecystectomy 06/08/2017 06/08/2017   Arthritis    Colon cancer [facility])    ED (erectile dysfunction)    Emphysema of lung (HCC)    Hyperlipemia    Hypertension    Prostate cancer (HCC)    prostate cancer-tx radiation   Wears glasses     Past Surgical History:  Procedure Laterality Date   APPENDECTOMY     CARPAL TUNNEL RELEASE  5/12   lt   CARPAL TUNNEL RELEASE  12/13/2011   Procedure: CARPAL TUNNEL RELEASE;  Surgeon: Lamar LULLA Leonor Mickey., MD;  Location: [facility];  Service: Orthopedics;  Laterality: Right;  and inject right wrist   CHOLECYSTECTOMY N/A 06/08/2017   Procedure: LAPAROSCOPIC CHOLECYSTECTOMY WITH LYSIS OF ADHESIONS;  Surgeon: Ethyl Lenis, MD;  Location: [facility];  Service: General;  Laterality: N/A;   COLON SURGERY   1997   hemicolectomy-rt-ca   COLONOSCOPY     about 12 inches of colon removed due to colon cancer   IR GASTROSTOMY TUBE MOD SED  11/06/2019   IR GASTROSTOMY TUBE REMOVAL  01/22/2020   RIGHT/LEFT HEART CATH AND CORONARY ANGIOGRAPHY N/A 06/12/2023   Procedure: RIGHT/LEFT HEART CATH AND CORONARY ANGIOGRAPHY;  Surgeon: Wonda Sharper, MD;  Location: [facility] INVASIVE CV LAB;  Service: Cardiovascular;  Laterality: N/A;    Current Outpatient Medications  Medication Sig Dispense Refill   amiodarone  (PACERONE ) 200 MG tablet Take 0.5 tablets (100 mg total) by mouth daily. (Patient taking differently: Take 100 mg by mouth at bedtime.) 45 tablet 3   apixaban  (ELIQUIS ) 5 MG TABS tablet Take 1 tablet (5 mg total) by mouth 2 (two) times daily. 180 tablet 3   carvedilol  (COREG ) 3.125 MG tablet Take 1 tablet (3.125 mg total) by mouth 2 (two) times daily with a meal. 180 tablet 3   losartan  (COZAAR ) 25 MG tablet Take 0.5 tablets (12.5 mg total) by mouth daily. 90 tablet 3   Probiotic Product (PROBIOTIC PO) Take 1 capsule by mouth daily.     rosuvastatin  (CRESTOR ) 10 MG tablet Take 1 tablet (10 mg total) by mouth daily. 90 tablet 3   VITAMIN D  PO Take 1 capsule by mouth daily.     No current facility-administered medications for this visit.    Allergies  Allergen Reactions   Colesevelam Other (See Comments)    increases TG, constipation   Fenofibrate  Other (See Comments)     muscle aches   Rosuvastatin  Other (See Comments)    Myalgias   Simvastatin Other (See Comments)    Myalgias   Amoxicillin-Pot Clavulanate Rash   Penicillins Swelling    Social History   Socioeconomic History   Marital status: Married    Spouse name: Not on file   Number of children: 2   Years of education: Not on file   Highest education level: Not on file  Occupational History   Not on file  Tobacco Use   Smoking status: Former    Current packs/day: 0.00    Average packs/day: 1 pack/day for 36.0 years (36.0 ttl pk-yrs)     Types: Cigarettes    Start date: 12/09/1950    Quit date: 12/09/1986    Years since quitting: 37.3   Smokeless tobacco: Never   Tobacco comments:    Former smoker 07/20/23  Vaping Use   Vaping status: Never Used  Substance and Sexual Activity   Alcohol use: Yes    Comment: occ   Drug use: No   Sexual activity: Not Currently  Other Topics Concern   Not on file  Social History Narrative   Not on file   Social Drivers of Health   Financial Resource Strain: Not on file  Food Insecurity: No Food Insecurity (06/09/2023)   Hunger Vital Sign    Worried About Running Out of Food in the Last Year: Never true    Ran Out of Food in the Last Year: Never true  Transportation Needs: No Transportation Needs (06/09/2023)   PRAPARE - Administrator, Civil Service (Medical): No    Lack of Transportation (Non-Medical): No  Physical Activity: Not on file  Stress: Not on file  Social Connections: Moderately Isolated (06/12/2023)   Social Connection and Isolation Panel    Frequency of Communication with Friends and Family: Once a week    Frequency of Social Gatherings with Friends and Family: Once a week    Attends Religious Services: 1 to 4 times per year    Active Member of [employer] or Organizations: No    Attends Banker Meetings: Never    Marital Status: Married  Catering manager Violence: Not At Risk (06/09/2023)   Humiliation, Afraid, Rape, and Kick questionnaire    Fear of Current or Ex-Partner: No    Emotionally Abused: No    Physically Abused: No    Sexually Abused: No    History reviewed. No pertinent family history.  Review of Systems:  As stated in the HPI and otherwise negative.   BP (!) 142/78 (BP Location: Left Arm, Patient Position: Sitting, Cuff Size: Normal)   Pulse 62   Ht 5' 11 (1.803 m)   Wt 143 lb (64.9 kg)   SpO2 97%   BMI 19.94 kg/m   Physical Examination: General: Well developed, well nourished, NAD  HEENT: OP clear, mucus  membranes moist  SKIN: warm, dry. No rashes. Neuro: No focal deficits  Musculoskeletal: Muscle strength 5/5 all ext  Psychiatric: Mood and affect normal  Neck: No JVD, no carotid bruits, no thyromegaly, no lymphadenopathy.  Lungs:Clear bilaterally, no wheezes, rhonci, crackles Cardiovascular: Regular rate and rhythm. No murmurs, gallops or rubs. Abdomen:Soft. Bowel sounds present. Non-tender.  Extremities: No lower extremity edema. Pulses are 2 + in the bilateral DP/PT.  EKG:  EKG is not ordered today. The ekg ordered today demonstrates   Recent Labs: 06/09/2023: B Natriuretic Peptide 590.0; Magnesium  2.0 11/05/2023:  ALT 18; BUN 21; Creatinine, Ser 1.19; Hemoglobin 13.7; Hemoglobin 13.9; Platelets 146; Platelets 150; Potassium 4.1; Sodium 135; TSH 1.632   Lipid Panel    Component Value Date/Time   CHOL 131 09/08/2023 0842   TRIG 55 09/08/2023 0842   HDL 51 09/08/2023 0842   CHOLHDL 2.6 09/08/2023 0842   CHOLHDL 3.7 06/09/2023 1224   VLDL 11 06/09/2023 1224   LDLCALC 68 09/08/2023 0842     Wt Readings from Last 3 Encounters:  04/02/24 143 lb (64.9 kg)  11/29/23 140 lb 6.4 oz (63.7 kg)  11/04/23 153 lb (69.4 kg)    Assessment and Plan:   1. CAD without angina: He has no chest pain. Continue medical management of CAD. Continue Coreg  and Crestor . He is not on an ASA since he is on Eliquis .   2. Non-ischemic cardiomyopathy: LV function normal by echo in July 2025. Continue Coreg  and Losartan .   3. Paroxysmal atrial fibrillation: Sinus on exam today. No palpitations. Continue amiodarone , Coreg  and Eliquis .   4. HTN: BP is well controlled. Continue current medications  5. Hyperlipidemia: LDL 68 in March 2025. Continue Crestor .   Labs/ tests ordered today include:  No orders of the defined types were placed in this encounter.  Disposition:   F/U with me in one year   Signed, Lonni Cash, MD, [facility] 04/02/2024 4:31 PM    [facility] [address] [place], [place], [place]  [address] Phone: [phone]; Fax: [phone]

## 2024-04-15 ENCOUNTER — Encounter: Payer: Self-pay | Admitting: Radiology

## 2024-04-18 ENCOUNTER — Ambulatory Visit (HOSPITAL_COMMUNITY): Admitting: Physician Assistant

## 2024-06-17 ENCOUNTER — Ambulatory Visit: Admitting: Physician Assistant

## 2024-06-20 ENCOUNTER — Other Ambulatory Visit

## 2024-06-24 ENCOUNTER — Ambulatory Visit: Admitting: Physician Assistant

## 2024-06-24 ENCOUNTER — Encounter: Payer: Self-pay | Admitting: Physician Assistant

## 2024-06-24 ENCOUNTER — Other Ambulatory Visit: Payer: Self-pay

## 2024-06-24 DIAGNOSIS — M19011 Primary osteoarthritis, right shoulder: Secondary | ICD-10-CM

## 2024-06-24 DIAGNOSIS — M1711 Unilateral primary osteoarthritis, right knee: Secondary | ICD-10-CM

## 2024-06-24 DIAGNOSIS — M170 Bilateral primary osteoarthritis of knee: Secondary | ICD-10-CM | POA: Diagnosis not present

## 2024-06-24 DIAGNOSIS — M1712 Unilateral primary osteoarthritis, left knee: Secondary | ICD-10-CM

## 2024-06-24 MED ORDER — LIDOCAINE HCL 1 % IJ SOLN
3.0000 mL | INTRAMUSCULAR | Status: AC | PRN
  Administered 2024-06-24: 3 mL

## 2024-06-24 MED ORDER — METHYLPREDNISOLONE ACETATE 40 MG/ML IJ SUSP
40.0000 mg | INTRAMUSCULAR | Status: AC | PRN
  Administered 2024-06-24: 40 mg via INTRA_ARTICULAR

## 2024-06-24 NOTE — Progress Notes (Signed)
 "  Office Visit Note   Patient: Julian Brown           Date of Birth: Nov 21, 1932           MRN: [id number] Visit Date: 06/24/2024              Requested by: Janey Santos, MD [address] [place],  [place] [address] PCP: Janey Santos, MD   Assessment & Plan: Visit Diagnoses:  1. Primary osteoarthritis of right shoulder   2. Primary osteoarthritis of right knee   3. Primary osteoarthritis of left knee     Plan:  Patient tolerated the cortisone injections both today.  Will set him up for an intra-articular injection for the right shoulder with Dr. Burnetta under ultrasound.  He needs to wait at least 3 months between cortisone injections both knees.  Follow-Up Instructions: Return if symptoms worsen or fail to improve.   Orders:  Orders Placed This Encounter  Procedures   Large Joint Inj   XR Shoulder Right   No orders of the defined types were placed in this encounter.     Procedures: Large Joint Inj: bilateral knee on 06/24/2024 4:29 PM Indications: pain Details: 22 G 1.5 in needle, anterolateral approach  Arthrogram: No  Medications (Right): 3 mL lidocaine  1 %; 40 mg methylPREDNISolone  acetate 40 MG/ML Medications (Left): 3 mL lidocaine  1 %; 40 mg methylPREDNISolone  acetate 40 MG/ML Outcome: tolerated well, no immediate complications Procedure, treatment alternatives, risks and benefits explained, specific risks discussed. Consent was given by the patient. Immediately prior to procedure a time out was called to verify the correct patient, procedure, equipment, support staff and site/side marked as required. Patient was prepped and draped in the usual sterile fashion.       Clinical Data: No additional findings.   Subjective: Chief Complaint  Patient presents with   Left Knee - Pain   Right Shoulder - Pain    HPI [name] comes in today requesting bilateral knee injections.  Left knee is more bothersome than the right.  Left.  Injections were on  02/15/2024 and getting good relief until recently.  He is also complaining of right shoulder pain pops with motion.  Notes decreased range of motion.  Denies any radicular symptoms down the arm.  Review of Systems  Constitutional:  Negative for chills and fever.     Objective: Vital Signs: There were no vitals taken for this visit.  Physical Exam Constitutional:      Appearance: He is not ill-appearing or diaphoretic.  Pulmonary:     Effort: Pulmonary effort is normal.  Neurological:     Mental Status: He is oriented to person, place, and time.  Psychiatric:        Mood and Affect: Mood normal.     Ortho Exam Right shoulder: Limited overhead activity passively to approximately 165 understanding increasing pain.  Internal/external rotation right shoulder reveals crepitus.  Bilateral shoulders with out of 5 strength with external/internal rotation against resistance.  Empty can test is negative bilaterally.  Liftoff test on the right is negative. Bilateral knees: No abnormal warmth erythema or effusion good range of motion both knees. Specialty Comments:  No specialty comments available.  Imaging: XR Shoulder Right Result Date: 06/24/2024 Right shoulder 3 views: Shoulder is well located.  Bone-on-bone glenohumeral arthritis.  Mild AC joint arthritic changes.  Subacromial space overall well-maintained.  No acute fractures or acute findings.    PMFS History: Patient Active Problem List   Diagnosis Date  Noted   Physical deconditioning 11/04/2023   Chronic systolic CHF (congestive heart failure) (HCC) 11/04/2023   History of CAD (coronary artery disease) 11/04/2023   Chronic hypoxic respiratory failure (HCC) 11/04/2023   History of COPD 11/04/2023   Fall at home, initial encounter 11/04/2023   Atypical atrial flutter (HCC) 07/20/2023   Hypercoagulable state due to paroxysmal atrial fibrillation (HCC) 07/20/2023   Acute heart failure with mildly reduced ejection fraction (HFmrEF,  41-49%) (HCC) 06/13/2023   Paroxysmal atrial fibrillation (HCC) 06/13/2023   ACS (acute coronary syndrome) (HCC) 06/09/2023   NSTEMI (non-ST elevated myocardial infarction) (HCC) 06/09/2023   Malignant neoplasm of supraglottis (HCC) 08/08/2019   Hoarseness of voice 06/12/2019   Cough 05/23/2019   Primary osteoarthritis of right wrist 07/31/2018   Stage 2 moderate COPD by GOLD classification (HCC) 05/16/2018   Allergic rhinitis 05/16/2018   Nocturnal hypoxemia 05/16/2018   Abnormal finding on lung imaging 05/16/2018   Hypertension    Hyperlipemia    Acute gangrenous cholecystitis s/p lap cholecystectomy 06/08/2017 06/08/2017   Pain in right wrist 01/04/2017   Chronic pain of left knee 01/04/2017   Chronic pain of right knee 01/04/2017   Unilateral primary osteoarthritis, left knee 01/04/2017   Unilateral primary osteoarthritis, right knee 01/04/2017   Past Medical History:  Diagnosis Date   Acute gangrenous cholecystitis s/p lap cholecystectomy 06/08/2017 06/08/2017   Arthritis    Colon cancer [facility])    ED (erectile dysfunction)    Emphysema of lung (HCC)    Hyperlipemia    Hypertension    Prostate cancer (HCC)    prostate cancer-tx radiation   Wears glasses     No family history on file.  Past Surgical History:  Procedure Laterality Date   APPENDECTOMY     CARPAL TUNNEL RELEASE  5/12   lt   CARPAL TUNNEL RELEASE  12/13/2011   Procedure: CARPAL TUNNEL RELEASE;  Surgeon: Lamar LULLA Leonor Mickey., MD;  Location: [facility];  Service: Orthopedics;  Laterality: Right;  and inject right wrist   CHOLECYSTECTOMY N/A 06/08/2017   Procedure: LAPAROSCOPIC CHOLECYSTECTOMY WITH LYSIS OF ADHESIONS;  Surgeon: Ethyl Lenis, MD;  Location: [facility];  Service: General;  Laterality: N/A;   COLON SURGERY  1997   hemicolectomy-rt-ca   COLONOSCOPY     about 12 inches of colon removed due to colon cancer   IR GASTROSTOMY TUBE MOD SED  11/06/2019   IR GASTROSTOMY TUBE REMOVAL  01/22/2020    RIGHT/LEFT HEART CATH AND CORONARY ANGIOGRAPHY N/A 06/12/2023   Procedure: RIGHT/LEFT HEART CATH AND CORONARY ANGIOGRAPHY;  Surgeon: Wonda Sharper, MD;  Location: [facility] INVASIVE CV LAB;  Service: Cardiovascular;  Laterality: N/A;   Social History   Occupational History   Not on file  Tobacco Use   Smoking status: Former    Current packs/day: 0.00    Average packs/day: 1 pack/day for 36.0 years (36.0 ttl pk-yrs)    Types: Cigarettes    Start date: 12/09/1950    Quit date: 12/09/1986    Years since quitting: 37.5   Smokeless tobacco: Never   Tobacco comments:    Former smoker 07/20/23  Vaping Use   Vaping status: Never Used  Substance and Sexual Activity   Alcohol use: Yes    Comment: occ   Drug use: No   Sexual activity: Not Currently        "

## 2024-06-25 MED ORDER — APIXABAN 5 MG PO TABS: 5.0000 mg | ORAL_TABLET | Freq: Two times a day (BID) | ORAL | 2 refills | Status: AC

## 2024-06-25 MED ORDER — LOSARTAN POTASSIUM 25 MG PO TABS: 12.5000 mg | ORAL_TABLET | Freq: Every day | ORAL | 2 refills | Status: AC

## 2024-06-25 MED ORDER — ROSUVASTATIN CALCIUM 10 MG PO TABS: 10.0000 mg | ORAL_TABLET | Freq: Every day | ORAL | 2 refills | Status: AC

## 2024-06-30 ENCOUNTER — Other Ambulatory Visit: Payer: Self-pay | Admitting: Cardiovascular Disease

## 2024-07-01 ENCOUNTER — Other Ambulatory Visit: Payer: Self-pay | Admitting: Cardiology

## 2024-07-01 MED ORDER — CARVEDILOL 3.125 MG PO TABS: 3.1250 mg | ORAL_TABLET | Freq: Two times a day (BID) | ORAL | 3 refills | Status: AC

## 2024-07-02 ENCOUNTER — Telehealth: Payer: Self-pay

## 2024-07-02 NOTE — Telephone Encounter (Signed)
 Copied from CRM [id number]. Topic: Clinical - Request for Lab/Test Order >> Jul 01, 2024 12:32 PM Russell PARAS wrote: Reason for CRM:  Pt's daughter, Julian Brown, is contacting clinic concerning CT of chest ordered by [facility]. She is wanting clarification on why the CT was ordered so that she can reschedule it. She would also like to speak to nurse about recent symptoms.   Requested call back when nurse avail  CB#  [phone]

## 2024-07-05 ENCOUNTER — Encounter: Payer: Self-pay | Admitting: Sports Medicine

## 2024-07-05 ENCOUNTER — Ambulatory Visit: Admitting: Sports Medicine

## 2024-07-05 ENCOUNTER — Other Ambulatory Visit: Payer: Self-pay

## 2024-07-05 DIAGNOSIS — M25511 Pain in right shoulder: Secondary | ICD-10-CM

## 2024-07-05 DIAGNOSIS — M19011 Primary osteoarthritis, right shoulder: Secondary | ICD-10-CM

## 2024-07-05 DIAGNOSIS — G8929 Other chronic pain: Secondary | ICD-10-CM

## 2024-07-05 DIAGNOSIS — M25512 Pain in left shoulder: Secondary | ICD-10-CM

## 2024-07-05 MED ORDER — LIDOCAINE HCL 1 % IJ SOLN
2.0000 mL | INTRAMUSCULAR | Status: AC | PRN
  Administered 2024-07-05: 2 mL

## 2024-07-05 MED ORDER — METHYLPREDNISOLONE ACETATE 40 MG/ML IJ SUSP
40.0000 mg | INTRAMUSCULAR | Status: AC | PRN
  Administered 2024-07-05: 40 mg via INTRA_ARTICULAR

## 2024-07-05 MED ORDER — BUPIVACAINE HCL 0.25 % IJ SOLN
2.0000 mL | INTRAMUSCULAR | Status: AC | PRN
  Administered 2024-07-05: 2 mL via INTRA_ARTICULAR

## 2024-07-05 NOTE — Progress Notes (Signed)
 "  Julian Brown - 89 y.o. male MRN [id number]  Date of birth: [birth year]  Office Visit Note: Visit Date: 07/05/2024 PCP: Janey Santos, MD Referred by: Janey Santos, MD  Subjective: Chief Complaint  Patient presents with   Right Shoulder - Pain   HPI: Julian Brown is a pleasant 89 y.o. male who presents today for chronic right shoulder pain with osteoarthritis.  His daughter is present during the visit today who does help provide some of HPI.  Julian Brown has had chronic right shoulder pain.  His daughter does report he gets popping and clicking in the shoulder with certain motions.  He is not taking any medication for this.  Was seen by [facility] previously who recommended further evaluation and consideration of injection.  Julian Brown also mentions that he gets pain in the left shoulder.  His daughter does mention that his right shoulder is the most bothersome but he does mention the left shoulder bothers him on occasion.  He does not have any imaging for this.  He has a notable past medical history of CAD, paroxysmal A-fib, managed on Eliquis  5 mg twice daily.  Unable to take NSAIDs.  Pertinent ROS were reviewed with the patient and found to be negative unless otherwise specified above in HPI.   Assessment & Plan: Visit Diagnoses:  1. Primary osteoarthritis of right shoulder   2. Chronic pain of both shoulders    Plan: Impression is chronic right shoulder pain with advanced osteoarthritic change.  He does report left-sided shoulder pain at times as well although his daughter notes this is not as bothersome.  Through shared decision making, did proceed with ultrasound-guided right intra-articular shoulder injection, patient tolerated well.  Advised on postinjection protocol.  May use ice/heat and/or Tylenol  as needed.  Avoid NSAIDs given Eliquis  use.  I did discuss with Julian Brown and his daughter that if the right shoulder is feeling very well and the left shoulder pain does continue, he can  schedule an appointment for evaluation of this and consideration of an injection.  We would want to start with x-rays first since this has not been evaluated yet.  They are understanding of this.  Otherwise we will follow-up as needed.  Follow-up: Return if symptoms worsen or fail to improve.   Meds & Orders: No orders of the defined types were placed in this encounter.   Orders Placed This Encounter  Procedures   Large Joint Inj   US  Guided Needle Placement - No Linked Charges     Procedures: Large Joint Inj: R glenohumeral on 07/05/2024 4:06 PM Indications: pain Details: 22 G 3.5 in needle, ultrasound-guided posterior approach Medications: 2 mL lidocaine  1 %; 2 mL bupivacaine  0.25 %; 40 mg methylPREDNISolone  acetate 40 MG/ML Outcome: tolerated well, no immediate complications  US -guided glenohumeral joint injection, Right shoulder After discussion on risks/benefits/indications, informed verbal consent was obtained. A timeout was then performed. The patient was positioned lying lateral recumbent on examination table. The patient's shoulder was prepped with betadine and multiple alcohol swabs and utilizing ultrasound guidance, the patient's glenohumeral joint was identified on ultrasound. Using ultrasound guidance a 22-gauge, 3.5 inch needle with a mixture of 2:2:1 cc's lidocaine :bupivicaine:depomedrol was directed from a lateral to medial direction via in-plane technique into the glenohumeral joint with visualization of appropriate spread of injectate into the joint. Patient tolerated the procedure well without immediate complications.      Procedure, treatment alternatives, risks and benefits explained, specific risks discussed. Consent was given by the patient. Immediately  prior to procedure a time out was called to verify the correct patient, procedure, equipment, support staff and site/side marked as required. Patient was prepped and draped in the usual sterile fashion.           Clinical History: No specialty comments available.  He reports that he quit smoking about 37 years ago. His smoking use included cigarettes. He started smoking about 73 years ago. He has a 36 pack-year smoking history. He has never used smokeless tobacco. No results for input(s): HGBA1C, LABURIC in the last 8760 hours.  Objective:    Physical Exam  Gen: Well-appearing, in no acute distress; non-toxic CV: Well-perfused. Warm.  Resp: Breathing unlabored on room air; no wheezing. Psych: Fluid speech in conversation; appropriate affect; normal thought process  Ortho Exam - Right shoulder: No redness swelling or effusion.  There is tenderness with palpation of the anterior and posterior joint recess.  There is limited range of motion in all planes with crepitus noted at endrange.  Imaging:  *Review x-ray of the right shoulder including AP, scapular Y and axial view were ordered and reviewed by myself today.  X-rays demonstrate significant narrowing of the glenohumeral joint with near bone-on-bone glenohumeral arthritic change.  There is at least mild AC joint arthritic change.  There is a small calcification just superior lateral to the tuberosity, possibly indicative of calcific tendinopathy noted on axial view.  No acute bony fracture noted.   Past Medical/Family/Surgical/Social History: Medications & Allergies reviewed per EMR, new medications updated. Patient Active Problem List   Diagnosis Date Noted   Physical deconditioning 11/04/2023   Chronic systolic CHF (congestive heart failure) (HCC) 11/04/2023   History of CAD (coronary artery disease) 11/04/2023   Chronic hypoxic respiratory failure (HCC) 11/04/2023   History of COPD 11/04/2023   Fall at home, initial encounter 11/04/2023   Atypical atrial flutter (HCC) 07/20/2023   Hypercoagulable state due to paroxysmal atrial fibrillation (HCC) 07/20/2023   Acute heart failure with mildly reduced ejection fraction (HFmrEF, 41-49%)  (HCC) 06/13/2023   Paroxysmal atrial fibrillation (HCC) 06/13/2023   ACS (acute coronary syndrome) (HCC) 06/09/2023   NSTEMI (non-ST elevated myocardial infarction) (HCC) 06/09/2023   Malignant neoplasm of supraglottis (HCC) 08/08/2019   Hoarseness of voice 06/12/2019   Cough 05/23/2019   Primary osteoarthritis of right wrist 07/31/2018   Stage 2 moderate COPD by GOLD classification (HCC) 05/16/2018   Allergic rhinitis 05/16/2018   Nocturnal hypoxemia 05/16/2018   Abnormal finding on lung imaging 05/16/2018   Hypertension    Hyperlipemia    Acute gangrenous cholecystitis s/p lap cholecystectomy 06/08/2017 06/08/2017   Pain in right wrist 01/04/2017   Chronic pain of left knee 01/04/2017   Chronic pain of right knee 01/04/2017   Unilateral primary osteoarthritis, left knee 01/04/2017   Unilateral primary osteoarthritis, right knee 01/04/2017   Past Medical History:  Diagnosis Date   Acute gangrenous cholecystitis s/p lap cholecystectomy 06/08/2017 06/08/2017   Arthritis    Colon cancer [facility])    ED (erectile dysfunction)    Emphysema of lung (HCC)    Hyperlipemia    Hypertension    Prostate cancer (HCC)    prostate cancer-tx radiation   Wears glasses    History reviewed. No pertinent family history. Past Surgical History:  Procedure Laterality Date   APPENDECTOMY     CARPAL TUNNEL RELEASE  5/12   lt   CARPAL TUNNEL RELEASE  12/13/2011   Procedure: CARPAL TUNNEL RELEASE;  Surgeon: Lamar LULLA Leonor Mickey., MD;  Location: [facility];  Service: Orthopedics;  Laterality: Right;  and inject right wrist   CHOLECYSTECTOMY N/A 06/08/2017   Procedure: LAPAROSCOPIC CHOLECYSTECTOMY WITH LYSIS OF ADHESIONS;  Surgeon: Ethyl Lenis, MD;  Location: [facility];  Service: General;  Laterality: N/A;   COLON SURGERY  1997   hemicolectomy-rt-ca   COLONOSCOPY     about 12 inches of colon removed due to colon cancer   IR GASTROSTOMY TUBE MOD SED  11/06/2019   IR GASTROSTOMY TUBE REMOVAL   01/22/2020   RIGHT/LEFT HEART CATH AND CORONARY ANGIOGRAPHY N/A 06/12/2023   Procedure: RIGHT/LEFT HEART CATH AND CORONARY ANGIOGRAPHY;  Surgeon: Wonda Sharper, MD;  Location: [facility] INVASIVE CV [facility];  Service: Cardiovascular;  Laterality: N/A;   Social History   Occupational History   Not on file  Tobacco Use   Smoking status: Former    Current packs/day: 0.00    Average packs/day: 1 pack/day for 36.0 years (36.0 ttl pk-yrs)    Types: Cigarettes    Start date: 12/09/1950    Quit date: 12/09/1986    Years since quitting: 37.5   Smokeless tobacco: Never   Tobacco comments:    Former smoker 07/20/23  Vaping Use   Vaping status: Never Used  Substance and Sexual Activity   Alcohol use: Yes    Comment: occ   Drug use: No   Sexual activity: Not Currently   "

## 2024-07-05 NOTE — Telephone Encounter (Signed)
 I called and spoke with the pt's daughter, ok per DPR  She had questions regarding the CT Chest that was ordered  She wanted to discuss further with MD before pt completes this  I scheduled the next available with Dr Geronimo  Nothing further needed at this time

## 2024-08-01 ENCOUNTER — Ambulatory Visit: Admitting: Internal Medicine

## 2024-09-26 ENCOUNTER — Ambulatory Visit: Admitting: Physician Assistant
